# Patient Record
Sex: Male | Born: 1980
Health system: Southern US, Community
[De-identification: ages and names within clinical notes are randomized; demographics above are authoritative.]

## PROBLEM LIST (undated history)

## (undated) DIAGNOSIS — G43909 Migraine, unspecified, not intractable, without status migrainosus: Secondary | ICD-10-CM

## (undated) DIAGNOSIS — C801 Malignant (primary) neoplasm, unspecified: Secondary | ICD-10-CM

## (undated) DIAGNOSIS — Z87442 Personal history of urinary calculi: Secondary | ICD-10-CM

## (undated) DIAGNOSIS — I2699 Other pulmonary embolism without acute cor pulmonale: Secondary | ICD-10-CM

## (undated) DIAGNOSIS — N410 Acute prostatitis: Secondary | ICD-10-CM

## (undated) DIAGNOSIS — I10 Essential (primary) hypertension: Secondary | ICD-10-CM

## (undated) DIAGNOSIS — F419 Anxiety disorder, unspecified: Secondary | ICD-10-CM

## (undated) DIAGNOSIS — K219 Gastro-esophageal reflux disease without esophagitis: Secondary | ICD-10-CM

## (undated) DIAGNOSIS — N200 Calculus of kidney: Secondary | ICD-10-CM

## (undated) DIAGNOSIS — F411 Generalized anxiety disorder: Secondary | ICD-10-CM

## (undated) DIAGNOSIS — R030 Elevated blood-pressure reading, without diagnosis of hypertension: Secondary | ICD-10-CM

## (undated) DIAGNOSIS — J309 Allergic rhinitis, unspecified: Secondary | ICD-10-CM

## (undated) HISTORY — DX: Personal history of urinary calculi: Z87.442

## (undated) HISTORY — DX: Anxiety disorder, unspecified: F41.9

## (undated) HISTORY — DX: Gastro-esophageal reflux disease without esophagitis: K21.9

## (undated) HISTORY — DX: Migraine, unspecified, not intractable, without status migrainosus: G43.909

## (undated) HISTORY — DX: Elevated blood-pressure reading, without diagnosis of hypertension: R03.0

## (undated) HISTORY — DX: Allergic rhinitis, unspecified: J30.9

## (undated) HISTORY — DX: Acute prostatitis: N41.0

## (undated) HISTORY — DX: Generalized anxiety disorder: F41.1

## (undated) HISTORY — PX: HERNIA REPAIR: SHX51

---
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
## 2003-01-12 ENCOUNTER — Emergency Department (HOSPITAL_COMMUNITY): Admission: EM | Admit: 2003-01-12 | Discharge: 2003-01-13 | Payer: Self-pay | Admitting: Emergency Medicine

## 2004-07-27 ENCOUNTER — Ambulatory Visit: Payer: Self-pay | Admitting: Internal Medicine

## 2004-09-30 ENCOUNTER — Emergency Department (HOSPITAL_COMMUNITY): Admission: EM | Admit: 2004-09-30 | Discharge: 2004-10-01 | Payer: Self-pay | Admitting: *Deleted

## 2004-10-01 IMAGING — CT CT ABDOMEN W/O CM
1 of 2 series · 16 of 32 positions shown, 20 images · non-contrast
Comparison: none

CLINICAL DATA: Hematuria, left flank pain

ABDOMEN CT WITHOUT CONTRAST - URINARY STONE PROTOCOL
TECHNIQUE: Multidetector CT imaging of the abdomen was performed following the
urinary stone protocol.  No oral or intravenous contrast was administered.
TECHNIQUE: Multidetector CT imaging of the pelvis was performed following the

[Series 2: abd/pelv w/o 5.0 b31f st · axial · non-contrast · 0.82mm/px · z∈[-515,-55]mm · 16 of 100 slices shown, 20 images]
[im 4/100  soft-tissue]
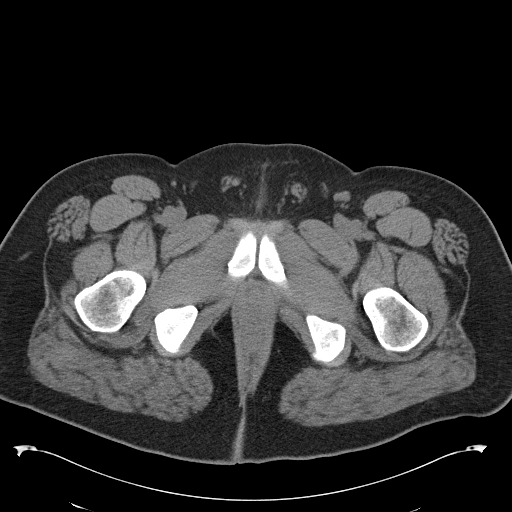
[im 4/100  bone]
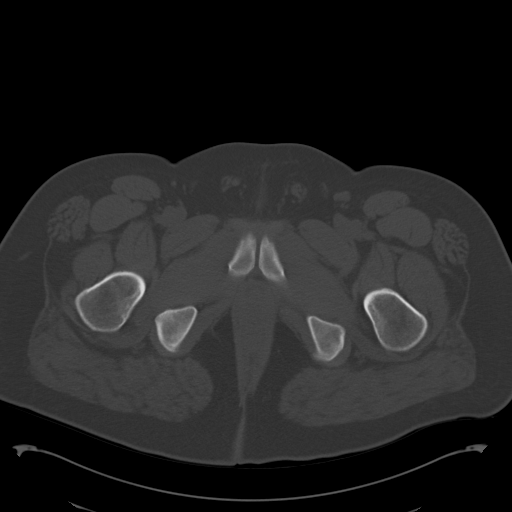
[im 12/100  soft-tissue]
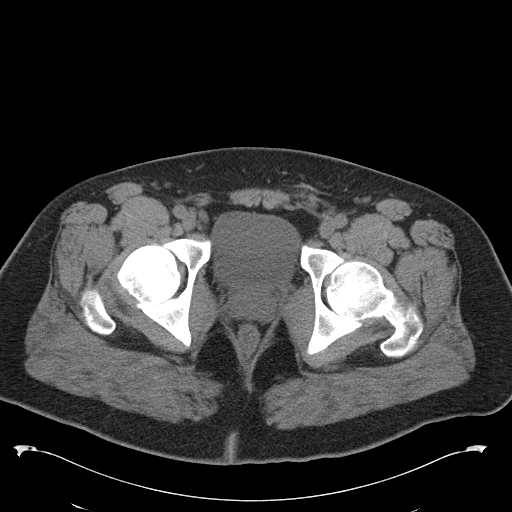
[im 20/100  soft-tissue]
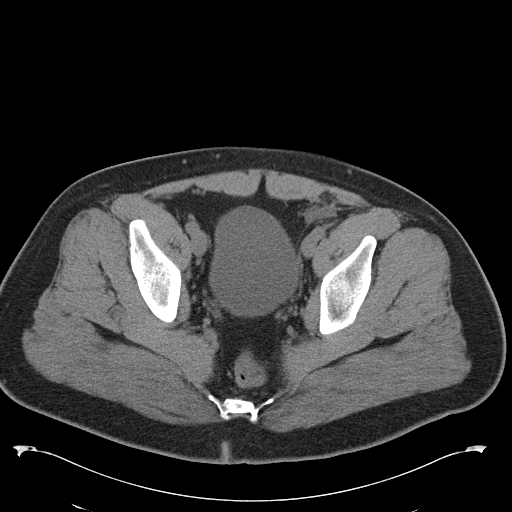
[im 28/100  soft-tissue]
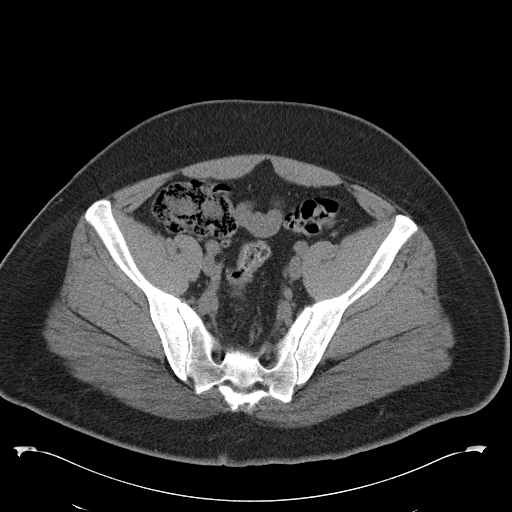
[im 32/100  soft-tissue]
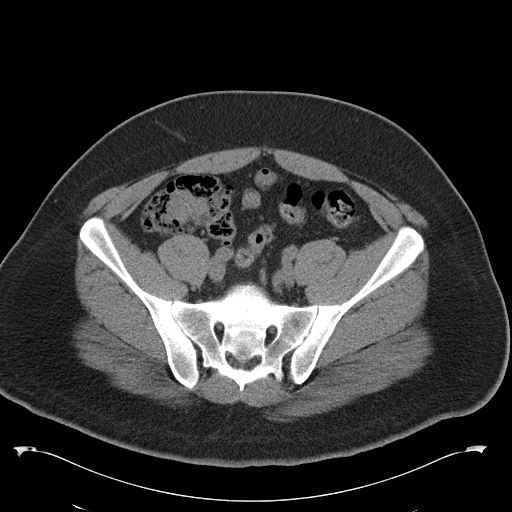
[im 40/100  soft-tissue]
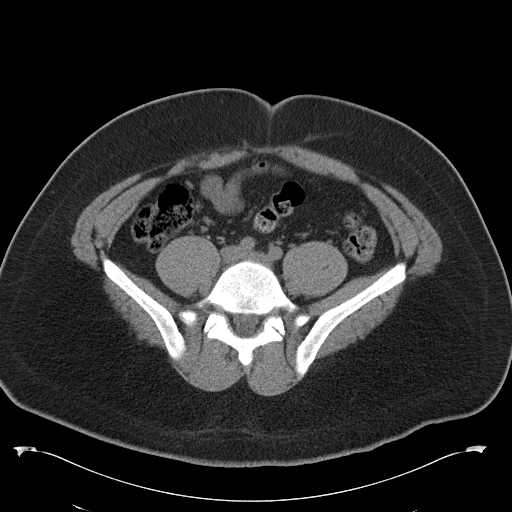
[im 48/100  soft-tissue]
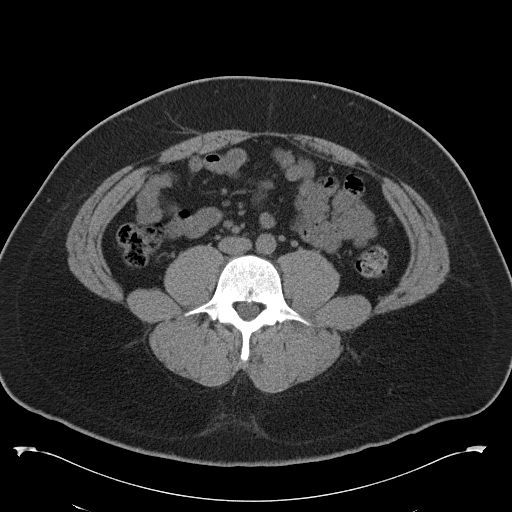
[im 52/100  soft-tissue]
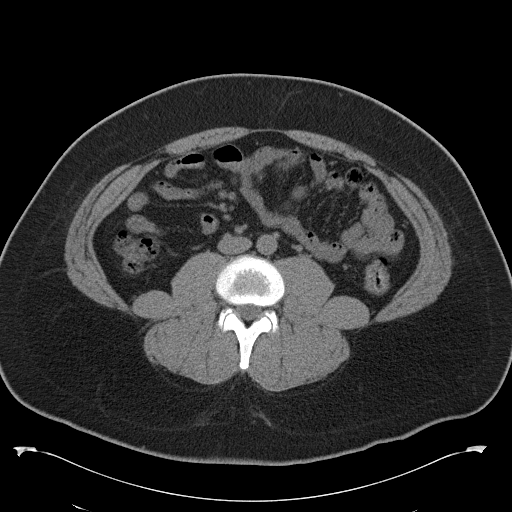
[im 60/100  soft-tissue]
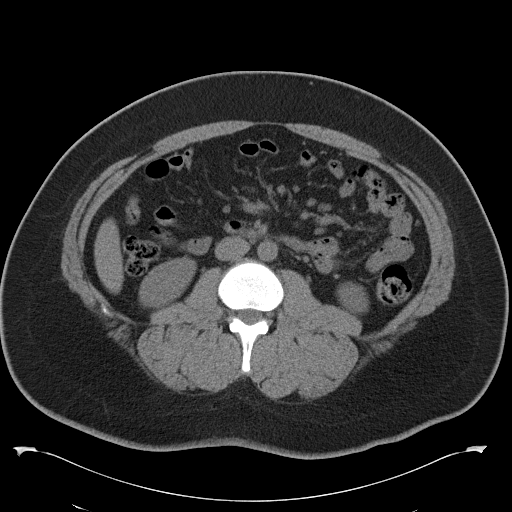
[im 60/100  bone]
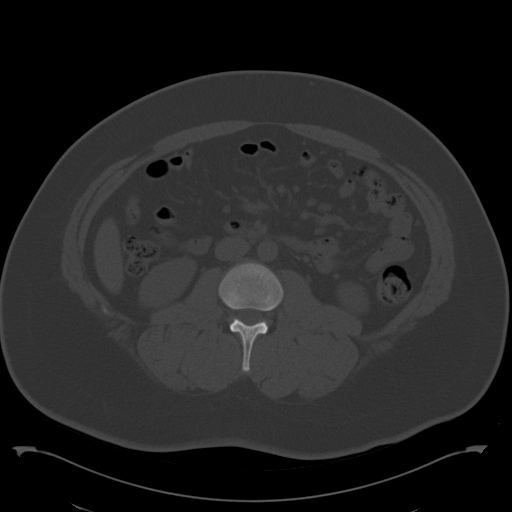
[im 68/100  soft-tissue]
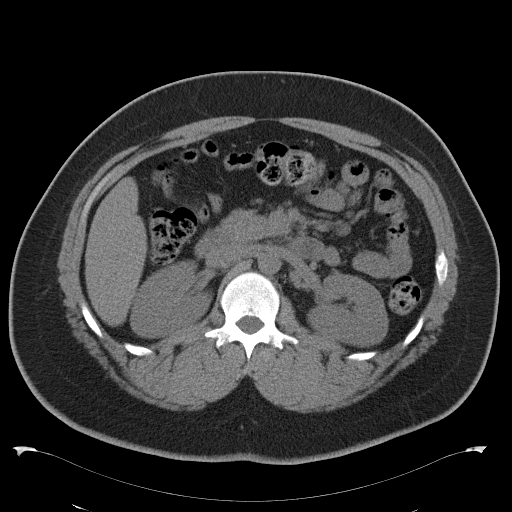
[im 76/100  soft-tissue]
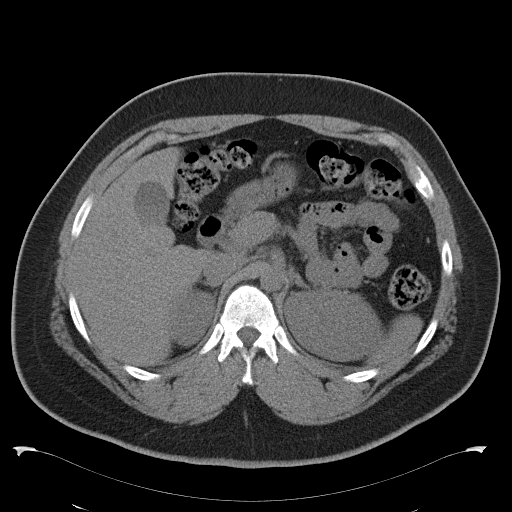
[im 80/100  soft-tissue]
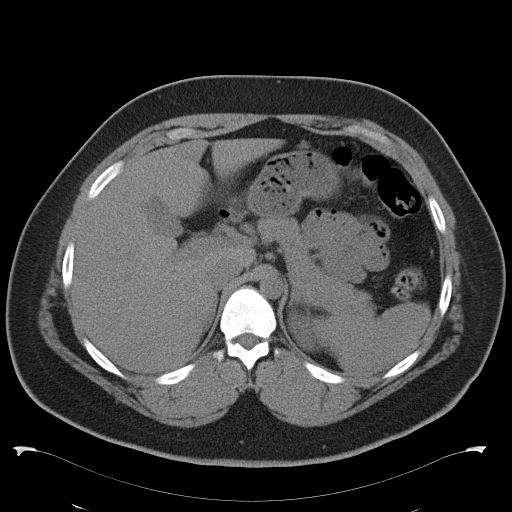
[im 84/100  lung]
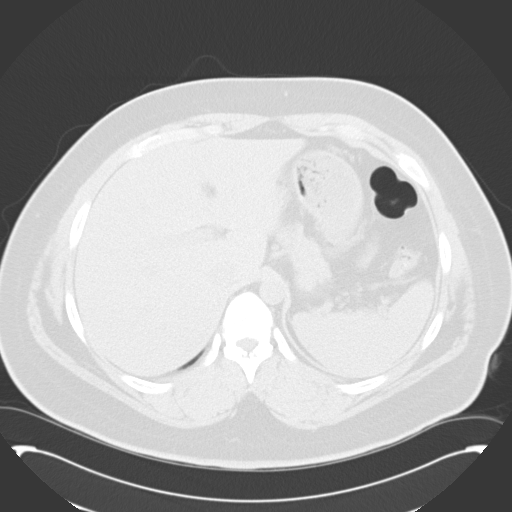
[im 88/100  soft-tissue]
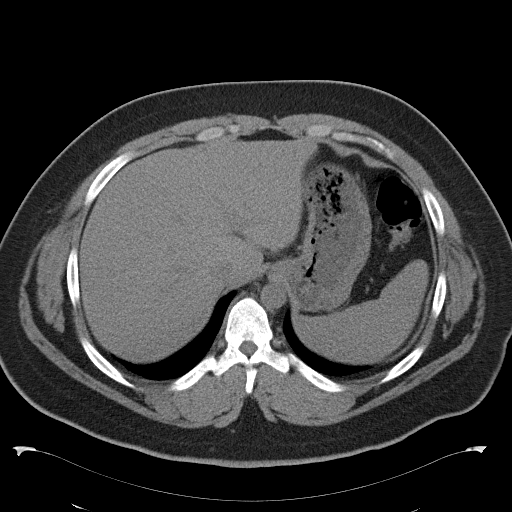
[im 88/100  lung]
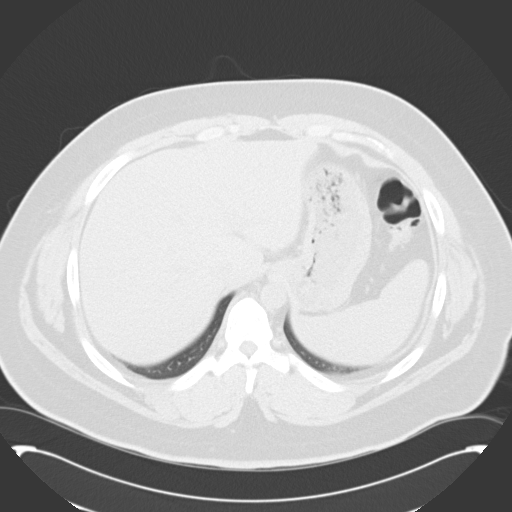
[im 92/100  lung]
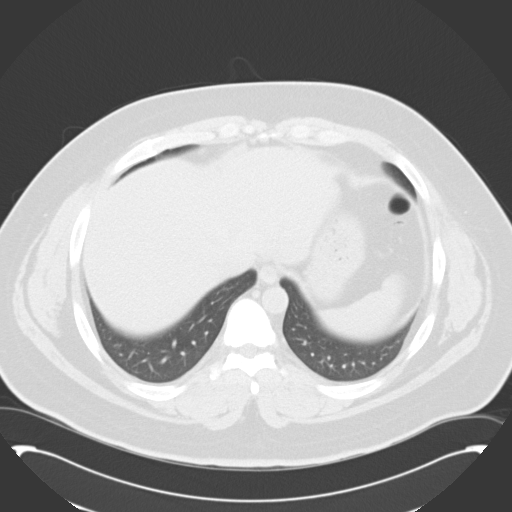
[im 96/100  soft-tissue]
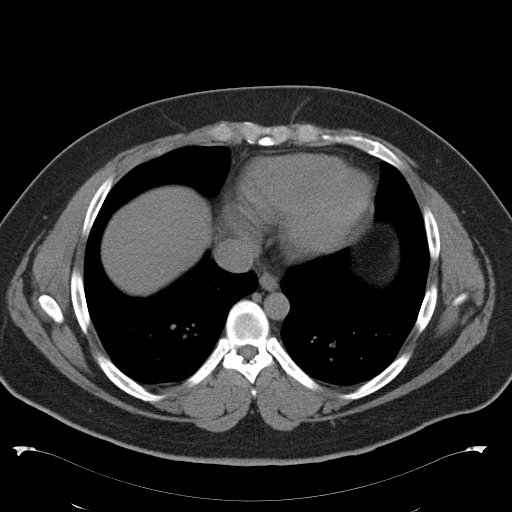
[im 96/100  lung]
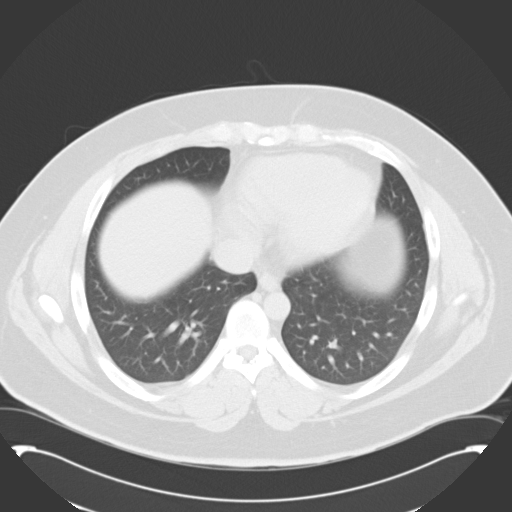

[16 of 32 positions shown; findings below may reference images not displayed]

FINDINGS: No evidence of hydronephrosis, renal, or ureteral calculi. Visualized
solid organs have an unremarkable uninfused appearance. Bowel and gallbladder
grossly unremarkable.

IMPRESSION

No acute findings. No hydronephrosis or stones.

PELVIS CT WITHOUT CONTRAST - URINARY STONE PROTOCOL
FINDINGS: Ureters rema[REDACTED]ompressed. No stones. Bowel grossly unremarkable.
Appendix is normal. No free fluid, free air, or adenopathy.

IMPRESSION

No stones. No acute findings.

## 2004-10-02 ENCOUNTER — Ambulatory Visit: Payer: Self-pay | Admitting: Internal Medicine

## 2005-10-12 ENCOUNTER — Ambulatory Visit: Payer: Self-pay | Admitting: Internal Medicine

## 2006-01-03 ENCOUNTER — Ambulatory Visit: Payer: Self-pay | Admitting: Internal Medicine

## 2006-07-01 ENCOUNTER — Ambulatory Visit: Payer: Self-pay | Admitting: Internal Medicine

## 2006-07-01 LAB — CONVERTED CEMR LAB
ALT: 22 units/L (ref 0–53)
AST: 17 units/L (ref 0–37)
Albumin: 3.8 g/dL (ref 3.5–5.2)
Alkaline Phosphatase: 89 units/L (ref 39–117)
BUN: 12 mg/dL (ref 6–23)
Basophils Absolute: 0 10*3/uL (ref 0.0–0.1)
Basophils Relative: 0.6 % (ref 0.0–1.0)
Bilirubin Urine: NEGATIVE
Bilirubin, Direct: 0.1 mg/dL (ref 0.0–0.3)
CO2: 32 meq/L (ref 19–32)
Calcium: 9.9 mg/dL (ref 8.4–10.5)
Chloride: 103 meq/L (ref 96–112)
Cholesterol: 197 mg/dL (ref 0–200)
Creatinine, Ser: 0.9 mg/dL (ref 0.4–1.5)
Crystals: NEGATIVE
Direct LDL: 86.3 mg/dL
Eosinophils Absolute: 0.1 10*3/uL (ref 0.0–0.6)
Eosinophils Relative: 3.2 % (ref 0.0–5.0)
GFR calc Af Amer: 130 mL/min
GFR calc non Af Amer: 108 mL/min
Glucose, Bld: 99 mg/dL (ref 70–99)
HCT: 38.6 % — ABNORMAL LOW (ref 39.0–52.0)
HDL: 31.6 mg/dL — ABNORMAL LOW (ref >39.0)
Hemoglobin, Urine: NEGATIVE
Hemoglobin: 13 g/dL (ref 13.0–17.0)
Ketones, ur: NEGATIVE mg/dL
Leukocytes, UA: NEGATIVE
Lymphocytes Relative: 37 % (ref 12.0–46.0)
MCHC: 33.6 g/dL (ref 30.0–36.0)
MCV: 86.5 fL (ref 78.0–100.0)
Monocytes Absolute: 0.4 10*3/uL (ref 0.2–0.7)
Monocytes Relative: 9.1 % (ref 3.0–11.0)
Neutro Abs: 2.4 10*3/uL (ref 1.4–7.7)
Neutrophils Relative %: 50.1 % (ref 43.0–77.0)
Nitrite: NEGATIVE
Platelets: 298 10*3/uL (ref 150–400)
Potassium: 4.2 meq/L (ref 3.5–5.1)
RBC / HPF: NONE SEEN
RBC: 4.46 M/uL (ref 4.22–5.81)
RDW: 13.4 % (ref 11.5–14.6)
Sodium: 141 meq/L (ref 135–145)
Specific Gravity, Urine: 1.025 (ref 1.000–1.03)
TSH: 1.5 microintl units/mL (ref 0.35–5.50)
Total Bilirubin: 0.9 mg/dL (ref 0.3–1.2)
Total CHOL/HDL Ratio: 6.2
Total Protein, Urine: NEGATIVE mg/dL
Total Protein: 7.8 g/dL (ref 6.0–8.3)
Triglycerides: 357 mg/dL (ref 0–149)
Urine Glucose: NEGATIVE mg/dL
Urobilinogen, UA: 0.2 (ref 0.0–1.0)
VLDL: 71 mg/dL — ABNORMAL HIGH (ref 0–40)
WBC: 4.6 10*3/uL (ref 4.5–10.5)
pH: 6 (ref 5.0–8.0)

## 2006-09-27 ENCOUNTER — Encounter: Payer: Self-pay | Admitting: *Deleted

## 2007-06-19 ENCOUNTER — Ambulatory Visit: Payer: Self-pay | Admitting: Internal Medicine

## 2007-06-19 DIAGNOSIS — F411 Generalized anxiety disorder: Secondary | ICD-10-CM

## 2007-06-19 DIAGNOSIS — R030 Elevated blood-pressure reading, without diagnosis of hypertension: Secondary | ICD-10-CM | POA: Insufficient documentation

## 2007-06-19 DIAGNOSIS — Z87442 Personal history of urinary calculi: Secondary | ICD-10-CM

## 2007-06-19 DIAGNOSIS — K219 Gastro-esophageal reflux disease without esophagitis: Secondary | ICD-10-CM | POA: Insufficient documentation

## 2007-06-19 DIAGNOSIS — J309 Allergic rhinitis, unspecified: Secondary | ICD-10-CM

## 2007-06-19 DIAGNOSIS — F419 Anxiety disorder, unspecified: Secondary | ICD-10-CM | POA: Insufficient documentation

## 2007-06-19 DIAGNOSIS — N410 Acute prostatitis: Secondary | ICD-10-CM

## 2007-06-19 HISTORY — DX: Personal history of urinary calculi: Z87.442

## 2007-06-19 HISTORY — DX: Elevated blood-pressure reading, without diagnosis of hypertension: R03.0

## 2007-06-19 HISTORY — DX: Generalized anxiety disorder: F41.1

## 2007-06-19 HISTORY — DX: Allergic rhinitis, unspecified: J30.9

## 2007-06-19 HISTORY — DX: Gastro-esophageal reflux disease without esophagitis: K21.9

## 2007-06-19 HISTORY — DX: Acute prostatitis: N41.0

## 2007-06-20 ENCOUNTER — Encounter: Payer: Self-pay | Admitting: Internal Medicine

## 2007-06-20 LAB — CONVERTED CEMR LAB
ALT: 22 units/L (ref 0–53)
AST: 19 units/L (ref 0–37)
Basophils Absolute: 0 10*3/uL (ref 0.0–0.1)
Bilirubin Urine: NEGATIVE
Bilirubin, Direct: 0.1 mg/dL (ref 0.0–0.3)
CO2: 33 meq/L — ABNORMAL HIGH (ref 19–32)
Chloride: 100 meq/L (ref 96–112)
Cholesterol: 184 mg/dL (ref 0–200)
Hemoglobin, Urine: NEGATIVE
Ketones, ur: NEGATIVE mg/dL
Leukocytes, UA: NEGATIVE
Lymphocytes Relative: 35.9 % (ref 12.0–46.0)
MCHC: 34 g/dL (ref 30.0–36.0)
Neutrophils Relative %: 51.4 % (ref 43.0–77.0)
Platelets: 283 10*3/uL (ref 150–400)
Potassium: 4.4 meq/L (ref 3.5–5.1)
RBC: 4.58 M/uL (ref 4.22–5.81)
RDW: 13.6 % (ref 11.5–14.6)
Sodium: 140 meq/L (ref 135–145)
Specific Gravity, Urine: 1.02 (ref 1.000–1.03)
Total Bilirubin: 1 mg/dL (ref 0.3–1.2)
Total CHOL/HDL Ratio: 6.1
Urobilinogen, UA: 0.2 (ref 0.0–1.0)
VLDL: 75 mg/dL — ABNORMAL HIGH (ref 0–40)

## 2007-07-14 ENCOUNTER — Ambulatory Visit: Payer: Self-pay | Admitting: Internal Medicine

## 2007-07-14 ENCOUNTER — Telehealth: Payer: Self-pay | Admitting: Internal Medicine

## 2007-07-14 DIAGNOSIS — J069 Acute upper respiratory infection, unspecified: Secondary | ICD-10-CM | POA: Insufficient documentation

## 2007-09-21 ENCOUNTER — Ambulatory Visit: Payer: Self-pay | Admitting: Internal Medicine

## 2007-09-21 DIAGNOSIS — L02838 Carbuncle of other sites: Secondary | ICD-10-CM

## 2007-09-21 DIAGNOSIS — L02828 Furuncle of other sites: Secondary | ICD-10-CM | POA: Insufficient documentation

## 2007-10-04 ENCOUNTER — Ambulatory Visit: Payer: Self-pay | Admitting: Internal Medicine

## 2007-12-17 ENCOUNTER — Emergency Department (HOSPITAL_COMMUNITY): Admission: EM | Admit: 2007-12-17 | Discharge: 2007-12-17 | Payer: Self-pay | Admitting: Emergency Medicine

## 2007-12-19 ENCOUNTER — Encounter: Payer: Self-pay | Admitting: Internal Medicine

## 2007-12-25 ENCOUNTER — Encounter: Admission: RE | Admit: 2007-12-25 | Discharge: 2007-12-25 | Payer: Self-pay | Admitting: Psychology

## 2007-12-25 IMAGING — US US ABDOMEN COMPLETE
1 series · 14 of 25 positions shown · non-contrast
Comparison: CT dated [DATE].

CLINICAL DATA: Right upper quadrant abdominal pain for the past 2-3
weeks.

ABDOMEN ULTRASOUND
TECHNIQUE: Complete abdominal ultrasound examination was performed
including evaluation of the liver, gallbladder, bile ducts,
pancreas, kidneys, spleen, IVC, and abdominal aorta.

[Series 1: us abdomen complete · 0.28mm/px · 14 of 80 slices shown]
[im 1/80]
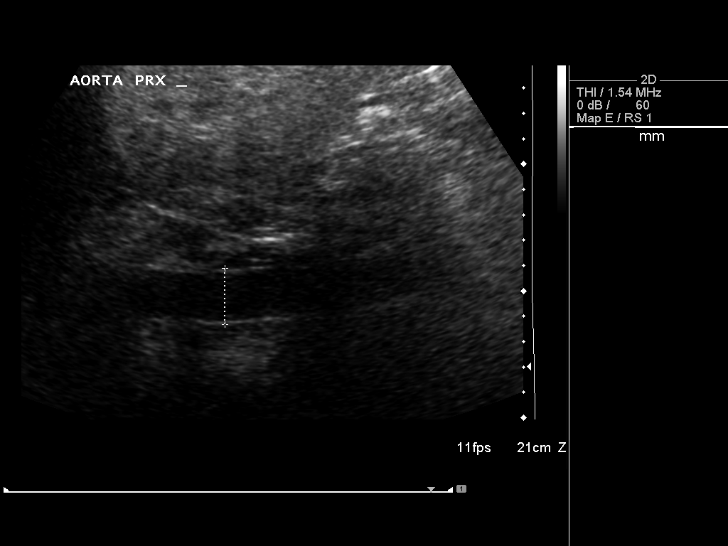
[im 7/80]
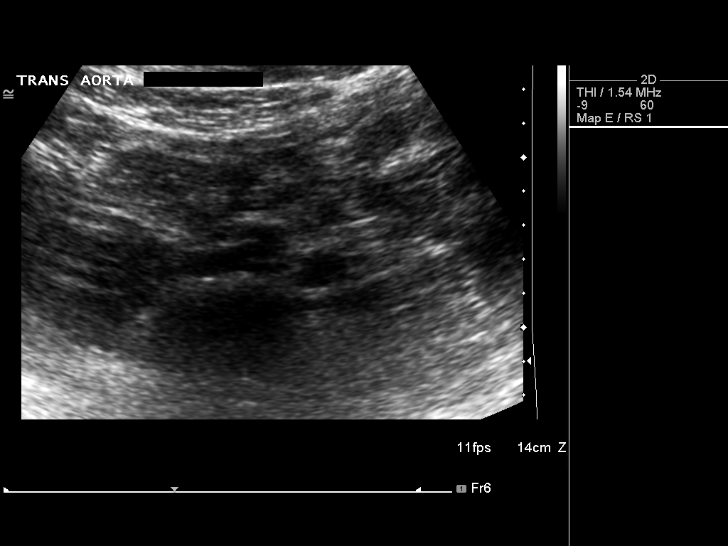
[im 14/80]
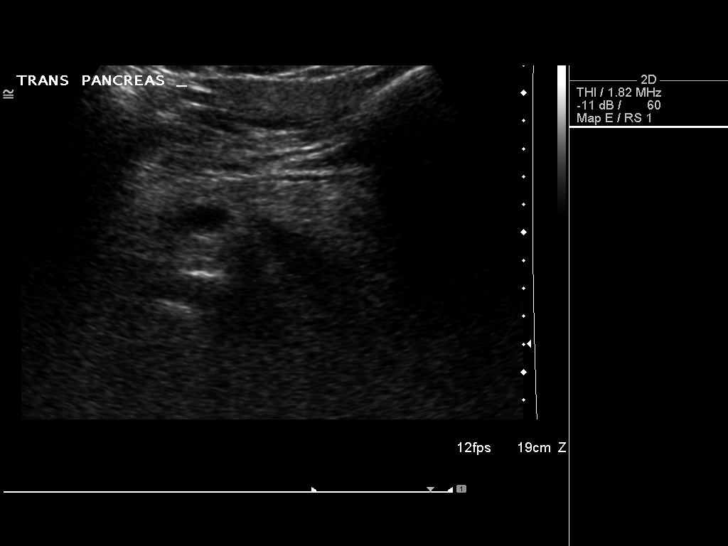
[im 20/80]
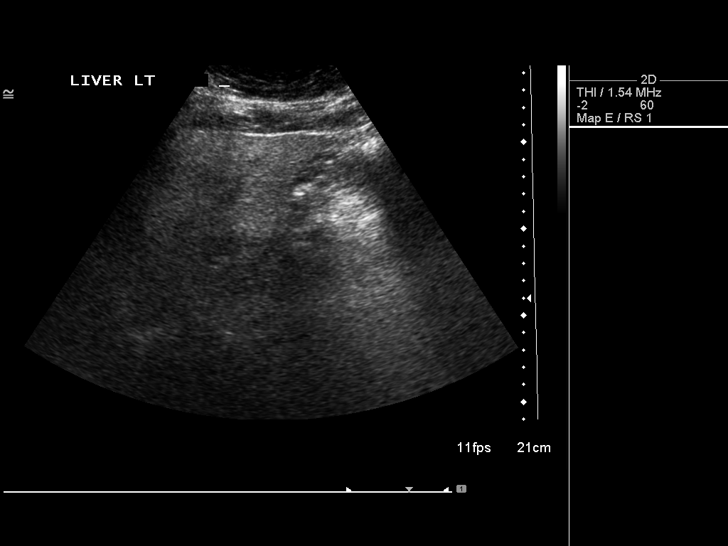
[im 27/80]
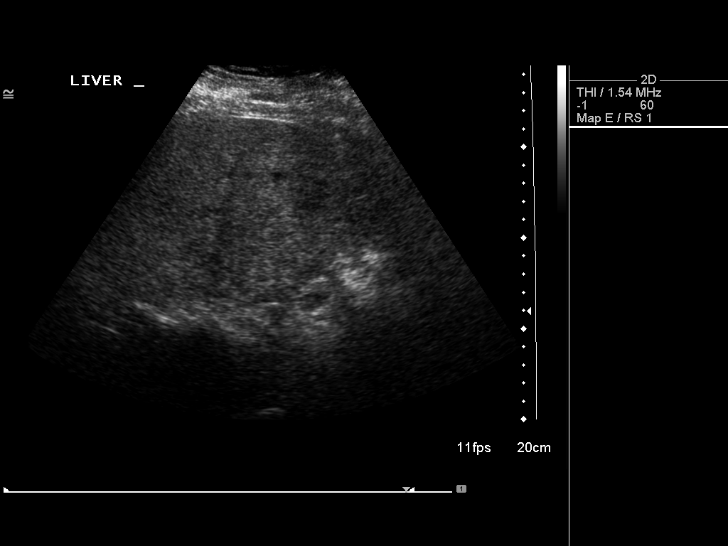
[im 30/80]
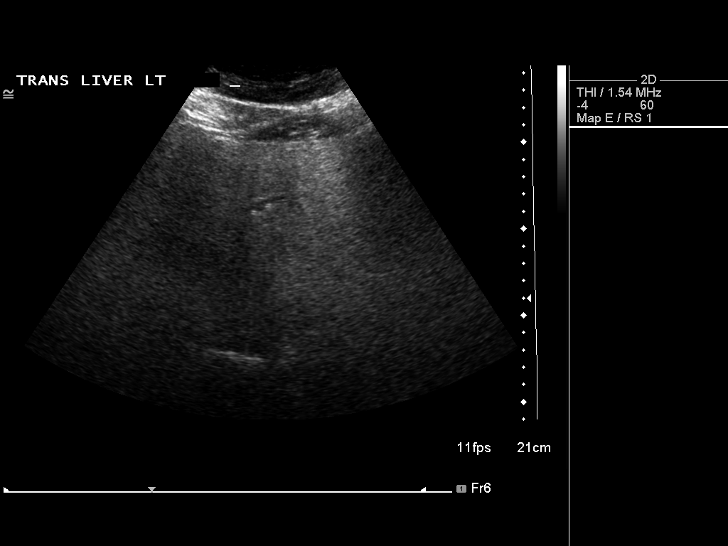
[im 37/80]
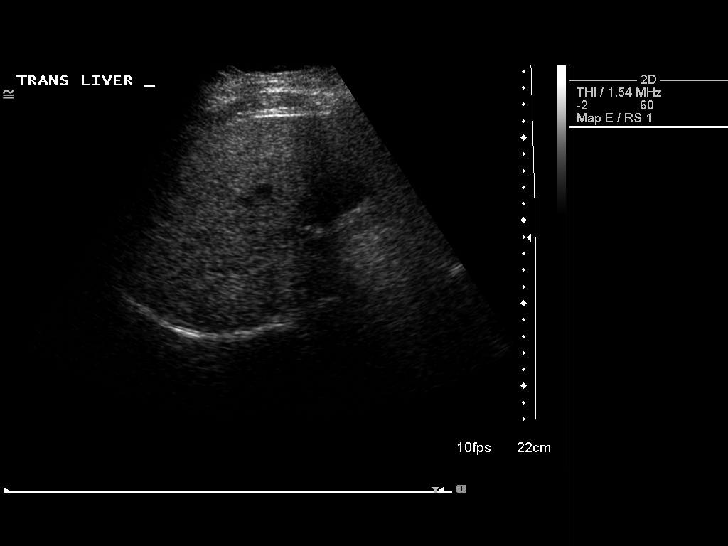
[im 43/80]
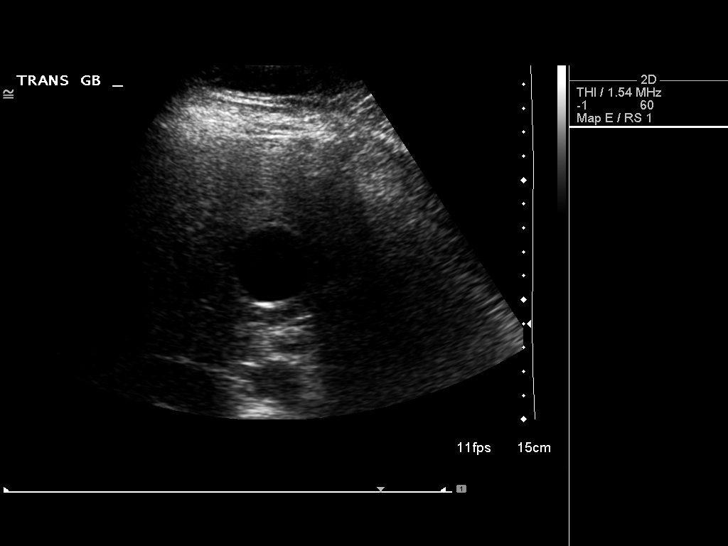
[im 50/80]
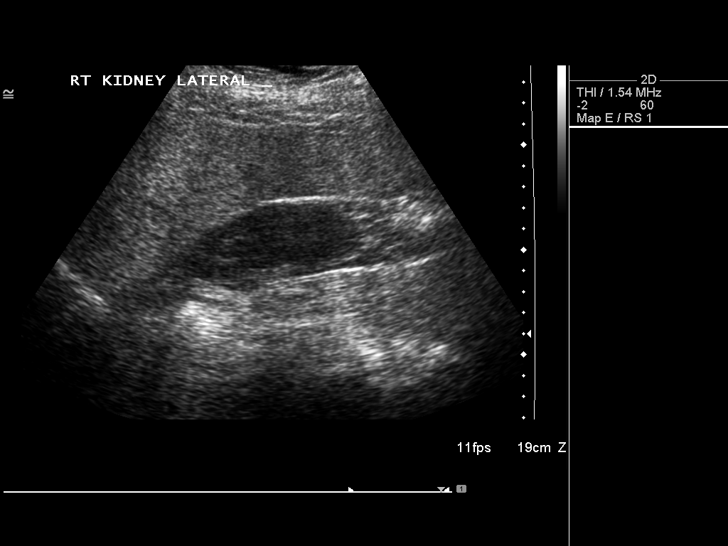
[im 53/80]
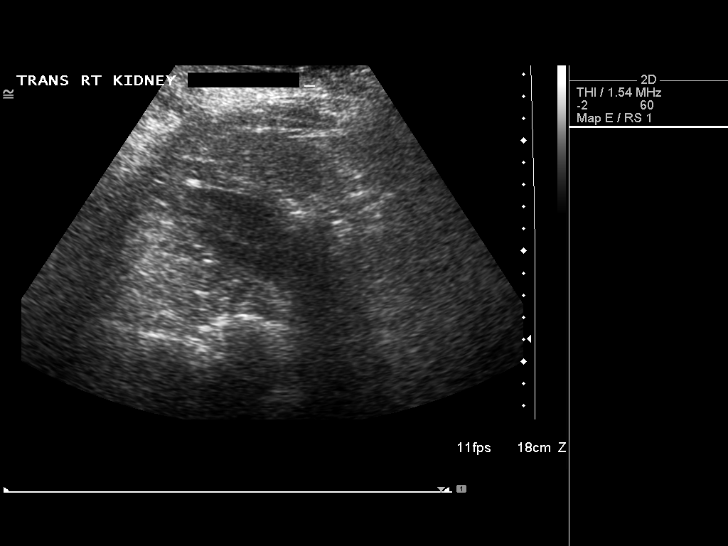
[im 60/80]
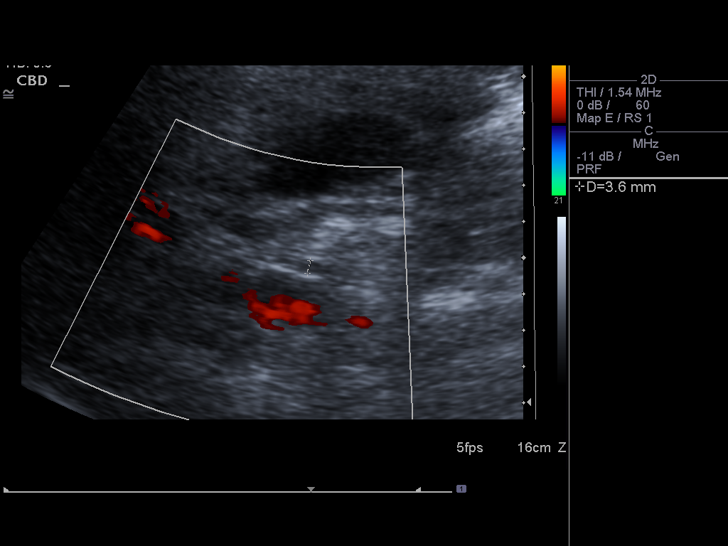
[im 66/80]
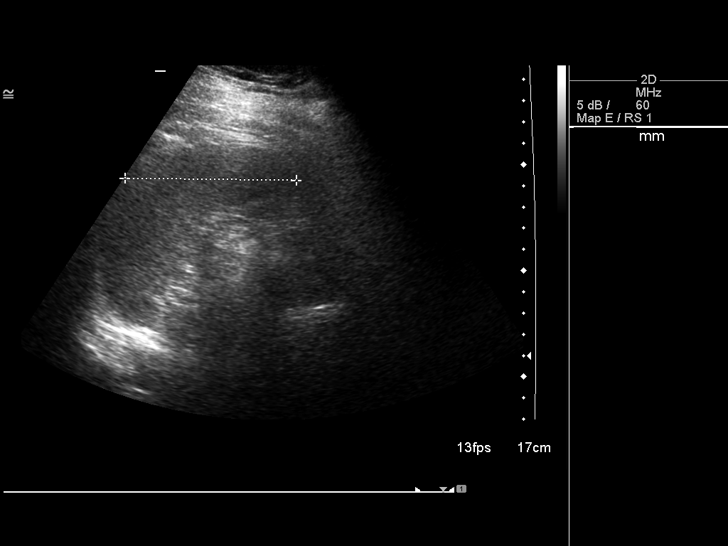
[im 73/80]
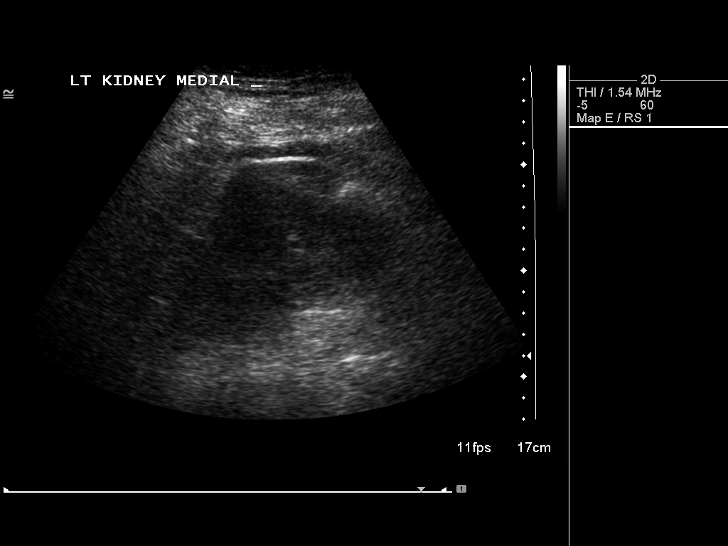
[im 80/80]
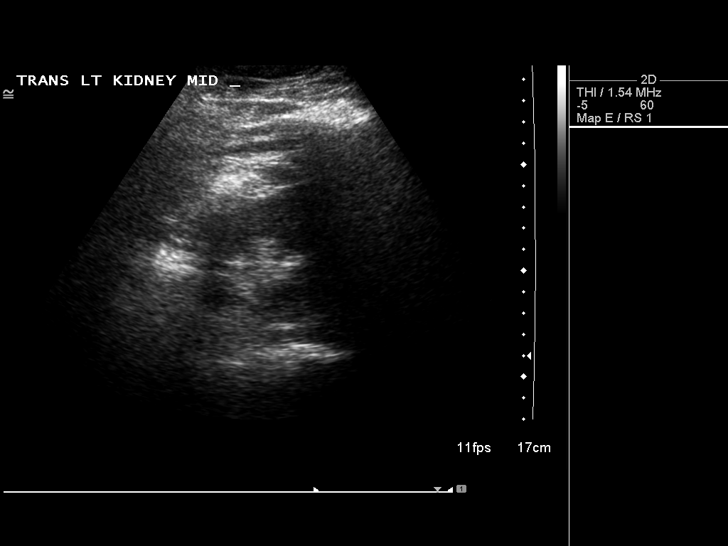

[14 of 25 positions shown; findings below may reference images not displayed]

FINDINGS: Diffusely echogenic and mildly heterogeneous liver.
Incompletely visualized pancreas with the visualized portions
appearing normal.  Normal appearing gallbladder, spleen, kidneys,
abdominal aorta and inferior vena cava.  No gallstones, biliary
ductal dilatation or free peritoneal fluid.  The proximal common
duct measures 3.6 mm in maximum diameter.
IMPRESSION: 1.  Diffusely echogenic and mildly heterogeneous liver, most likely
due to interval fatty infiltration.
2.  Incompletely visualized pancreas.

## 2008-01-16 ENCOUNTER — Encounter: Payer: Self-pay | Admitting: Internal Medicine

## 2008-05-15 ENCOUNTER — Ambulatory Visit: Payer: Self-pay | Admitting: Internal Medicine

## 2008-05-16 LAB — CONVERTED CEMR LAB
ALT: 38 units/L (ref 0–53)
Albumin: 3.6 g/dL (ref 3.5–5.2)
BUN: 8 mg/dL (ref 6–23)
Basophils Relative: 0.7 % (ref 0.0–3.0)
Bilirubin Urine: NEGATIVE
Bilirubin, Direct: 0.1 mg/dL (ref 0.0–0.3)
CO2: 32 meq/L (ref 19–32)
Chloride: 108 meq/L (ref 96–112)
Cholesterol: 197 mg/dL (ref 0–200)
Creatinine, Ser: 0.9 mg/dL (ref 0.4–1.5)
Eosinophils Absolute: 0.2 10*3/uL (ref 0.0–0.7)
HCT: 39.2 % (ref 39.0–52.0)
HDL: 29.4 mg/dL — ABNORMAL LOW (ref >39.00)
Ketones, ur: NEGATIVE mg/dL
Lymphs Abs: 1.1 10*3/uL (ref 0.7–4.0)
MCHC: 34.5 g/dL (ref 30.0–36.0)
MCV: 85.1 fL (ref 78.0–100.0)
Monocytes Absolute: 0.5 10*3/uL (ref 0.1–1.0)
Neutrophils Relative %: 65 % (ref 43.0–77.0)
Potassium: 4 meq/L (ref 3.5–5.1)
RBC: 4.61 M/uL (ref 4.22–5.81)
Specific Gravity, Urine: 1.02 (ref 1.000–1.030)
TSH: 1.5 microintl units/mL (ref 0.35–5.50)
Total Protein, Urine: NEGATIVE mg/dL
Total Protein: 8 g/dL (ref 6.0–8.3)
Triglycerides: 448 mg/dL — ABNORMAL HIGH (ref 0.0–149.0)
Urine Glucose: NEGATIVE mg/dL
pH: 7 (ref 5.0–8.0)

## 2010-09-07 ENCOUNTER — Emergency Department (HOSPITAL_COMMUNITY)
Admission: EM | Admit: 2010-09-07 | Discharge: 2010-09-07 | Disposition: A | Discharge status: Home / Self Care | Payer: 59 | Attending: Emergency Medicine | Admitting: Emergency Medicine

## 2010-09-07 ENCOUNTER — Encounter (HOSPITAL_COMMUNITY): Payer: Self-pay

## 2010-09-07 ENCOUNTER — Emergency Department (HOSPITAL_COMMUNITY): Payer: 59

## 2010-09-07 DIAGNOSIS — N201 Calculus of ureter: Secondary | ICD-10-CM | POA: Insufficient documentation

## 2010-09-07 DIAGNOSIS — R109 Unspecified abdominal pain: Secondary | ICD-10-CM | POA: Insufficient documentation

## 2010-09-07 HISTORY — DX: Calculus of kidney: N20.0

## 2010-09-07 LAB — URINALYSIS, ROUTINE W REFLEX MICROSCOPIC
Glucose, UA: NEGATIVE mg/dL
Leukocytes, UA: NEGATIVE
Protein, ur: NEGATIVE mg/dL
Specific Gravity, Urine: 1.021 (ref 1.005–1.030)
pH: 5.5 (ref 5.0–8.0)

## 2010-09-07 LAB — URINE MICROSCOPIC-ADD ON

## 2010-09-07 IMAGING — CT CT ABD-PELV W/O CM
2 of 4 series · 17 of 46 positions shown, 19 images · non-contrast
Comparison: [DATE]

CLINICAL DATA: Right flank pain since [DATE] a.m. today.
Microhematuria.

CT ABDOMEN AND PELVIS WITHOUT CONTRAST
TECHNIQUE: Multidetector CT imaging of the abdomen and pelvis was
performed following the standard protocol without intravenous
contrast.

[Series 2: renal stone · axial · 0.98mm/px · z∈[-532,-82]mm · 14 of 100 slices shown, 16 images]
[im 5/100  soft-tissue]
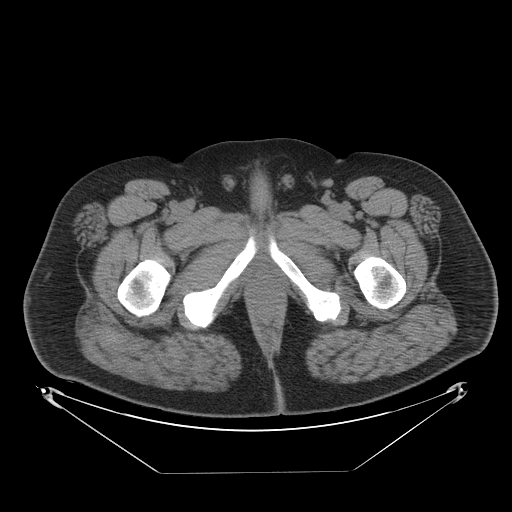
[im 5/100  bone]
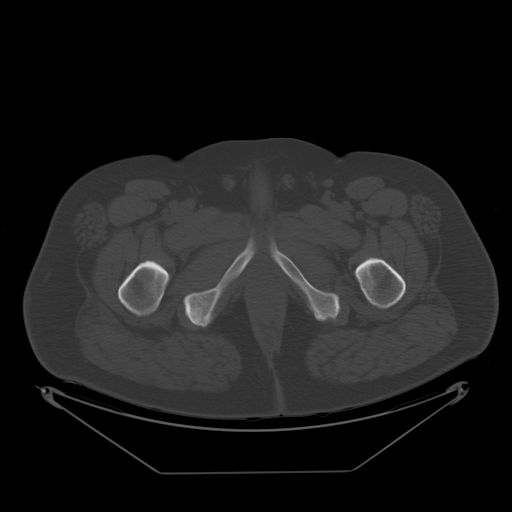
[im 13/100  soft-tissue]
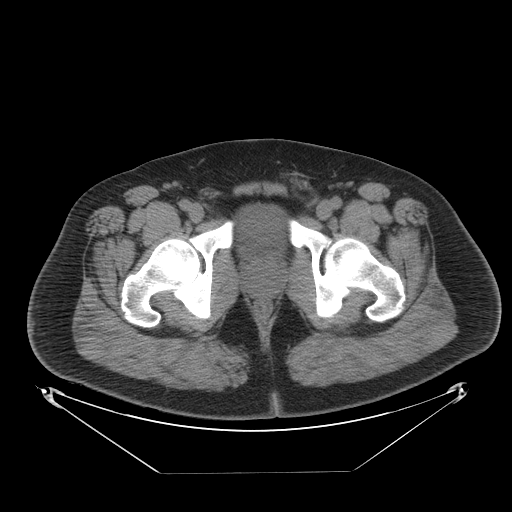
[im 21/100  soft-tissue]
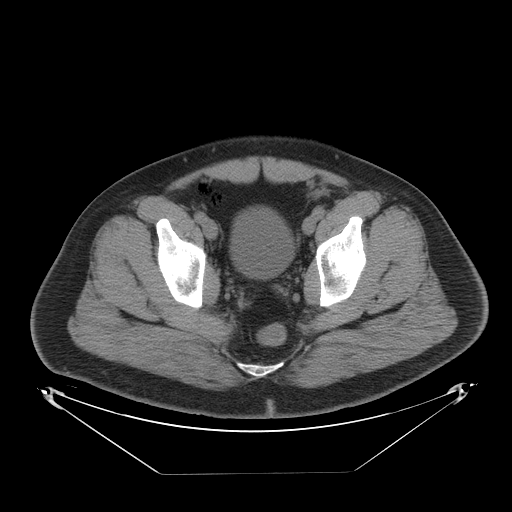
[im 25/100  soft-tissue]
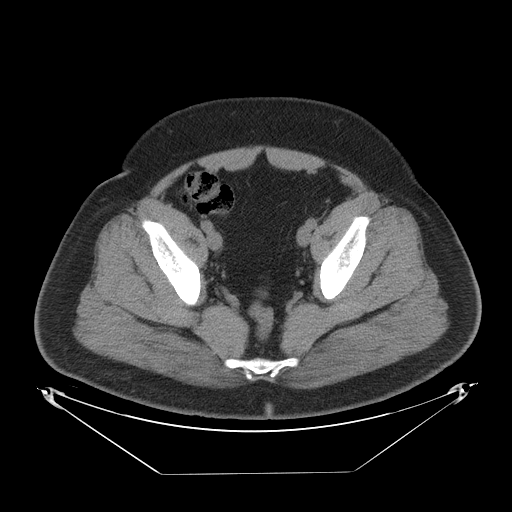
[im 34/100  soft-tissue]
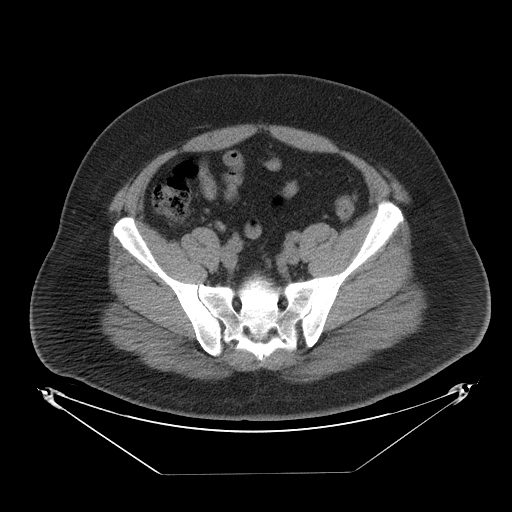
[im 42/100  soft-tissue]
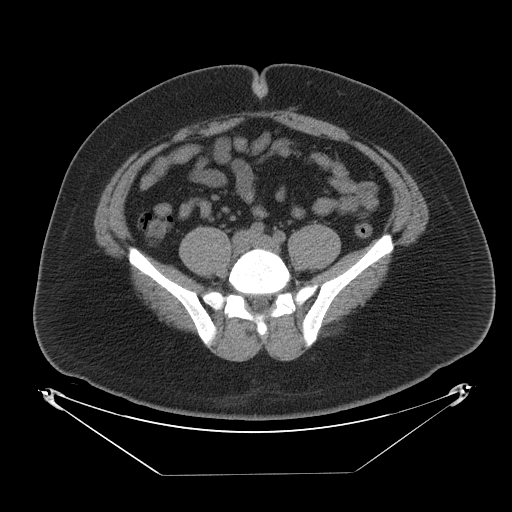
[im 46/100  soft-tissue]
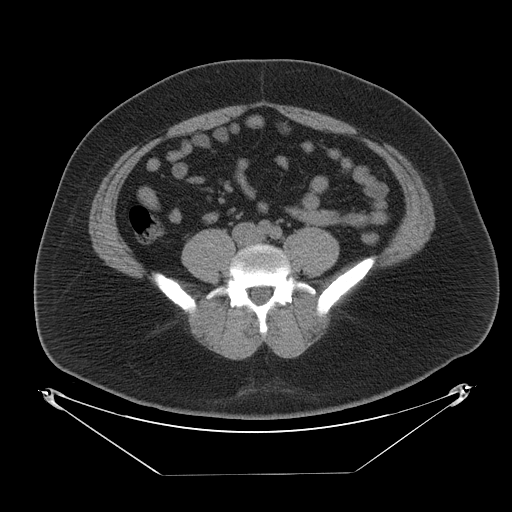
[im 54/100  soft-tissue]
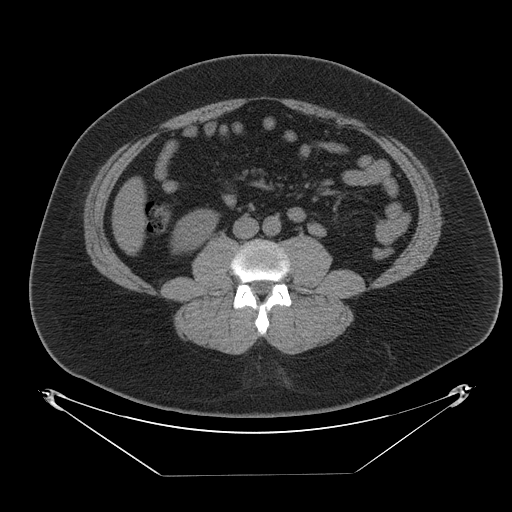
[im 58/100  soft-tissue]
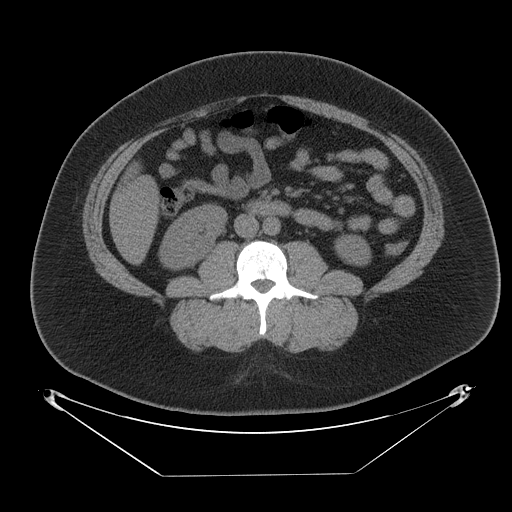
[im 58/100  bone]
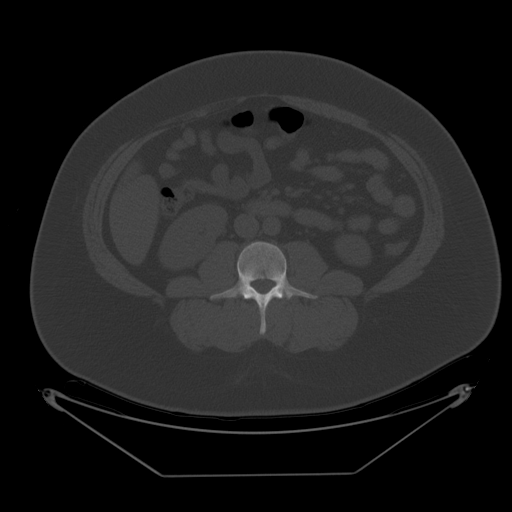
[im 67/100  soft-tissue]
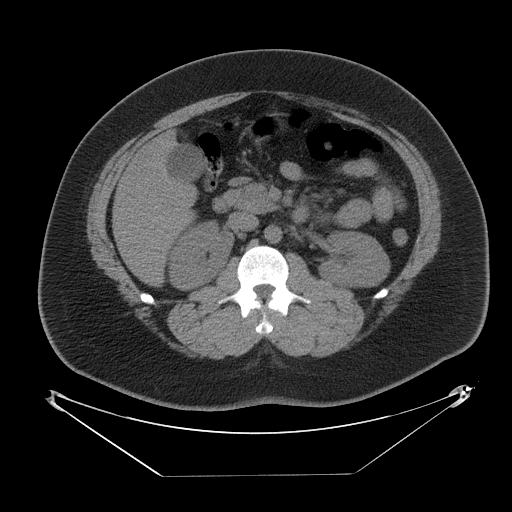
[im 75/100  soft-tissue]
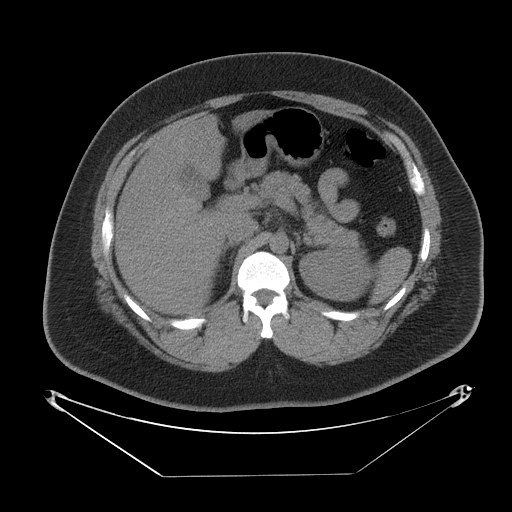
[im 79/100  soft-tissue]
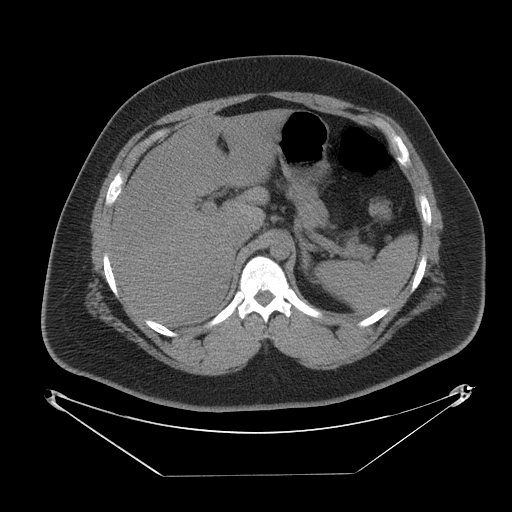
[im 87/100  soft-tissue]
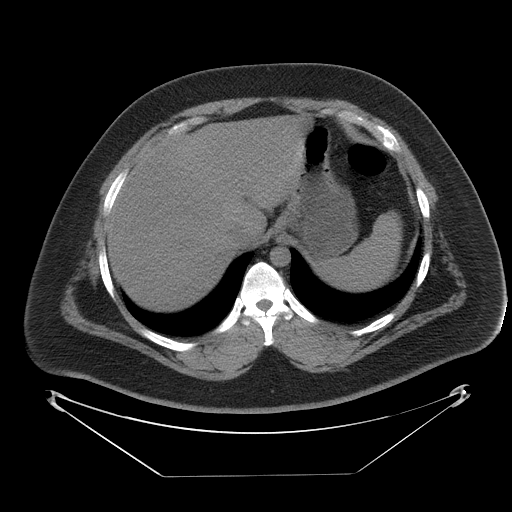
[im 95/100  soft-tissue]
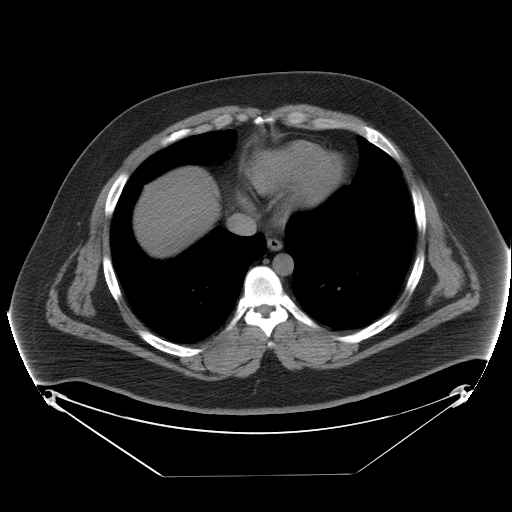

[Series 401: coronal · coronal · 1.01mm/px · 3 of 109 slices shown]
[im 37/109  soft-tissue]
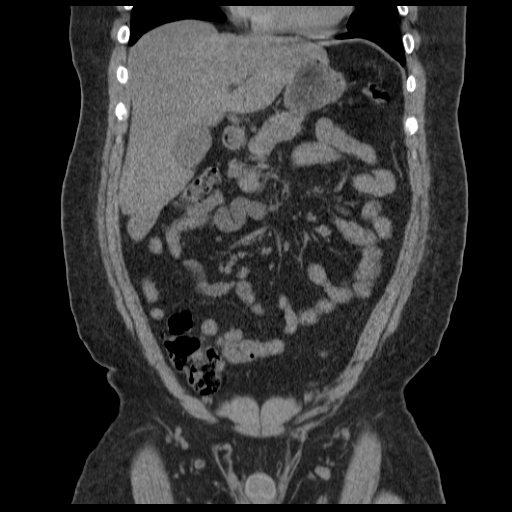
[im 49/109  soft-tissue]
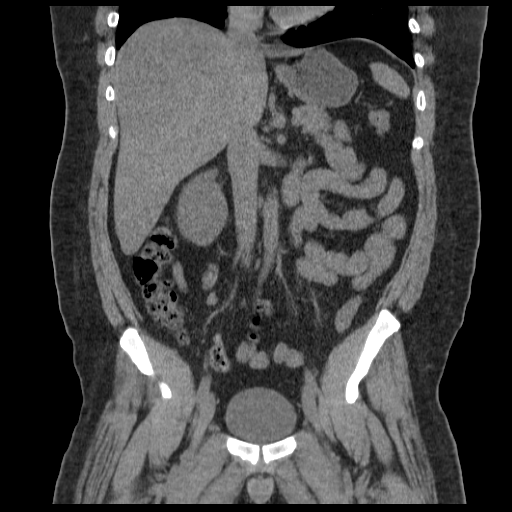
[im 61/109  soft-tissue]
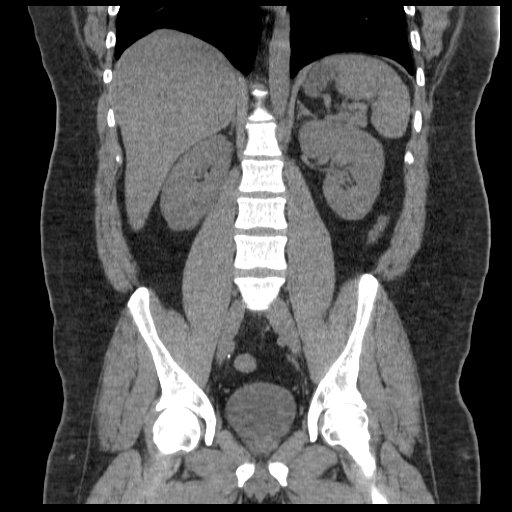

[17 of 46 positions shown; findings below may reference images not displayed]

FINDINGS: Lung bases are normal in appearance.  There is a 2 mm
calculus within the lower right ureter, at the level of the mid
sacrum.  This causes little hydronephrosis.  Minimal perinephric
stranding.  No additional intrarenal calculi are identified on the
right.  No renal mass identified.

No focal abnormality identified within the liver, spleen, left
kidney, pancreas, adrenal glands.  The gallbladder is present and
contains layering debris or possible stones.  No other CT evidence
for acute cholecystitis.  The stomach and small bowel loops have a
normal appearance.  Colonic loops are normal in appearance.  The
appendix is well seen and has a normal appearance. The urinary
bladder has a normal appearance.  No evidence for urethral calculi.

No retroperitoneal or mesenteric adenopathy. No evidence for aortic
aneurysm. Visualized osseous structures have a normal appearance.
IMPRESSION: 2 mm calculus in the lower right ureter, at the level of the mid
sacrum.

## 2010-09-25 ENCOUNTER — Telehealth: Payer: Self-pay

## 2010-09-25 DIAGNOSIS — Z0000 Encounter for general adult medical examination without abnormal findings: Secondary | ICD-10-CM

## 2010-09-25 NOTE — Telephone Encounter (Signed)
Put order in for labs. 

## 2010-10-15 ENCOUNTER — Other Ambulatory Visit (INDEPENDENT_AMBULATORY_CARE_PROVIDER_SITE_OTHER): Payer: 59

## 2010-10-15 ENCOUNTER — Encounter: Payer: Self-pay | Admitting: Internal Medicine

## 2010-10-15 ENCOUNTER — Other Ambulatory Visit: Payer: Self-pay | Admitting: Internal Medicine

## 2010-10-15 DIAGNOSIS — Z0000 Encounter for general adult medical examination without abnormal findings: Secondary | ICD-10-CM

## 2010-10-15 LAB — BASIC METABOLIC PANEL
BUN: 12 mg/dL (ref 6–23)
Creatinine, Ser: 1.1 mg/dL (ref 0.4–1.5)
GFR: 102.28 mL/min (ref >60.00)
Glucose, Bld: 97 mg/dL (ref 70–99)

## 2010-10-15 LAB — URINALYSIS, ROUTINE W REFLEX MICROSCOPIC
Bilirubin Urine: NEGATIVE
Ketones, ur: NEGATIVE
Leukocytes, UA: NEGATIVE
Nitrite: NEGATIVE
Specific Gravity, Urine: 1.02 (ref 1.000–1.030)
Urobilinogen, UA: 0.2 (ref 0.0–1.0)
pH: 6 (ref 5.0–8.0)

## 2010-10-15 LAB — HEPATIC FUNCTION PANEL
ALT: 24 U/L (ref 0–53)
AST: 23 U/L (ref 0–37)
Albumin: 4.2 g/dL (ref 3.5–5.2)
Total Bilirubin: 0.7 mg/dL (ref 0.3–1.2)

## 2010-10-15 LAB — CBC WITH DIFFERENTIAL/PLATELET
Basophils Relative: 0.9 % (ref 0.0–3.0)
Eosinophils Absolute: 0.2 10*3/uL (ref 0.0–0.7)
Eosinophils Relative: 2.8 % (ref 0.0–5.0)
HCT: 40.2 % (ref 39.0–52.0)
Hemoglobin: 13.4 g/dL (ref 13.0–17.0)
MCHC: 33.4 g/dL (ref 30.0–36.0)
MCV: 86.1 fl (ref 78.0–100.0)
Monocytes Absolute: 0.4 10*3/uL (ref 0.1–1.0)
Neutro Abs: 3.1 10*3/uL (ref 1.4–7.7)
RBC: 4.67 Mil/uL (ref 4.22–5.81)
WBC: 5.7 10*3/uL (ref 4.5–10.5)

## 2010-10-15 LAB — TSH: TSH: 1.44 u[IU]/mL (ref 0.35–5.50)

## 2010-10-15 LAB — LIPID PANEL
Cholesterol: 184 mg/dL (ref 0–200)
Total CHOL/HDL Ratio: 5
VLDL: 72.8 mg/dL — ABNORMAL HIGH (ref 0.0–40.0)

## 2010-10-18 ENCOUNTER — Encounter: Payer: Self-pay | Admitting: Internal Medicine

## 2010-10-18 DIAGNOSIS — Z7189 Other specified counseling: Secondary | ICD-10-CM | POA: Insufficient documentation

## 2010-10-18 DIAGNOSIS — Z0000 Encounter for general adult medical examination without abnormal findings: Secondary | ICD-10-CM | POA: Insufficient documentation

## 2010-10-18 DIAGNOSIS — Z0001 Encounter for general adult medical examination with abnormal findings: Secondary | ICD-10-CM | POA: Insufficient documentation

## 2010-10-21 ENCOUNTER — Encounter: Payer: Self-pay | Admitting: Internal Medicine

## 2010-10-21 ENCOUNTER — Ambulatory Visit (INDEPENDENT_AMBULATORY_CARE_PROVIDER_SITE_OTHER): Payer: 59 | Admitting: Internal Medicine

## 2010-10-21 VITALS — BP 130/80 | HR 92 | Temp 98.1°F | Ht 76.0 in | Wt 325.1 lb

## 2010-10-21 DIAGNOSIS — Z0000 Encounter for general adult medical examination without abnormal findings: Secondary | ICD-10-CM

## 2010-10-21 DIAGNOSIS — E669 Obesity, unspecified: Secondary | ICD-10-CM

## 2010-10-21 DIAGNOSIS — Z23 Encounter for immunization: Secondary | ICD-10-CM

## 2010-10-21 MED ORDER — PHENTERMINE HCL 37.5 MG PO CAPS: 37.5000 mg | ORAL_CAPSULE | ORAL | Status: AC

## 2010-10-21 NOTE — Progress Notes (Signed)
Subjective:    Patient ID: Marcus Burns, male    DOB: 06/13/1980, 30 y.o.   MRN: [id number]  HPI Here for wellness and f/u;  Overall doing ok;  Pt denies CP, worsening SOB, DOE, wheezing, orthopnea, PND, worsening LE edema, palpitations, dizziness or syncope.  Pt denies neurological change such as new Headache, facial or extremity weakness.  Pt denies polydipsia, polyuria, or low sugar symptoms. Pt states overall good compliance with treatment and medications, good tolerability, and trying to follow lower cholesterol diet.  Pt denies worsening depressive symptoms, suicidal ideation or panic. No fever, wt loss, night sweats, loss of appetite, or other constitutional symptoms.  Pt states good ability with ADL's, low fall risk, home safety reviewed and adequate, no significant changes in hearing or vision, and occasionally active with exercise.  No acute complaints. Hard to lose wt, now over 300 lbs Past Medical History  Diagnosis Date  . Inguinal hernia   . Kidney stone   . Anxiety   . GERD (gastroesophageal reflux disease)   . Migraines   . Acute prostatitis 06/19/2007  . ALLERGIC RHINITIS 06/19/2007  . ANXIETY 06/19/2007  . ELEVATED BLOOD PRESSURE WITHOUT DIAGNOSIS OF HYPERTENSION 06/19/2007  . GERD 06/19/2007  . NEPHROLITHIASIS, HX OF 06/19/2007   Past Surgical History  Procedure Date  . Hernia repair     reports that he has quit smoking. He does not have any smokeless tobacco history on file. He reports that he does not drink alcohol or use illicit drugs. family history includes Anxiety disorder in his brother and mother; Coronary artery disease in his father; and Diabetes in his brother. No Known Allergies Current Outpatient Prescriptions on File Prior to Visit  Medication Sig Dispense Refill  . ALPRAZolam (XANAX) 0.5 MG tablet Take 0.5 mg by mouth 2 (two) times daily as needed.         Review of Systems Review of Systems  Constitutional: Negative for diaphoresis, activity change,  appetite change and unexpected weight change.  HENT: Negative for hearing loss, ear pain, facial swelling, mouth sores and neck stiffness.   Eyes: Negative for pain, redness and visual disturbance.  Respiratory: Negative for shortness of breath and wheezing.   Cardiovascular: Negative for chest pain and palpitations.  Gastrointestinal: Negative for diarrhea, blood in stool, abdominal distention and rectal pain.  Genitourinary: Negative for hematuria, flank pain and decreased urine volume.  Musculoskeletal: Negative for myalgias and joint swelling.  Skin: Negative for color change and wound.  Neurological: Negative for syncope and numbness.  Hematological: Negative for adenopathy.  Psychiatric/Behavioral: Negative for hallucinations, self-injury, decreased concentration and agitation.       Objective:   Physical Exam BP 130/80  Pulse 92  Temp(Src) 98.1 F (36.7 C) (Oral)  Ht 6\' 4"  (1.93 m)  Wt 325 lb 1.9 oz (147.473 kg)  BMI 39.57 kg/m2  SpO2 98% Physical Exam  VS noted Constitutional: Pt is oriented to person, place, and time. Appears well-developed and well-nourished.  HENT:  Head: Normocephalic and atraumatic.  Right Ear: External ear normal.  Left Ear: External ear normal.  Nose: Nose normal.  Mouth/Throat: Oropharynx is clear and moist.  Eyes: Conjunctivae and EOM are normal. Pupils are equal, round, and reactive to light.  Neck: Normal range of motion. Neck supple. No JVD present. No tracheal deviation present.  Cardiovascular: Normal rate, regular rhythm, normal heart sounds and intact distal pulses.   Pulmonary/Chest: Effort normal and breath sounds normal.  Abdominal: Soft. Bowel sounds are normal. There  is no tenderness.  Musculoskeletal: Normal range of motion. Exhibits no edema.  Lymphadenopathy:  Has no cervical adenopathy.  Neurological: Pt is alert and oriented to person, place, and time. Pt has normal reflexes. No cranial nerve deficit.  Skin: Skin is warm and  dry. No rash noted.  Psychiatric:  Has  normal mood and affect. Behavior is normal.     Assessment & Plan:

## 2010-10-21 NOTE — Assessment & Plan Note (Signed)
Ok for phentermine limited rx,  to f/u any worsening symptoms or concerns

## 2010-10-21 NOTE — Assessment & Plan Note (Signed)

## 2010-10-21 NOTE — Patient Instructions (Addendum)
Take all new medications as prescribed - the phentermine for weight loss Continue all other medications as before Please get regular exercise, and follow lower calorie, low cholesterol/low fat diet Please return in 1 year for your yearly visit, or sooner if needed, with Lab testing done 3-5 days before

## 2010-11-12 ENCOUNTER — Encounter: Payer: 59 | Admitting: Internal Medicine

## 2011-06-14 ENCOUNTER — Emergency Department (HOSPITAL_COMMUNITY)
Admission: EM | Admit: 2011-06-14 | Discharge: 2011-06-14 | Discharge status: Absent without leave | Payer: Self-pay | Source: Home / Self Care

## 2011-10-22 ENCOUNTER — Encounter: Payer: 59 | Admitting: Internal Medicine

## 2011-10-22 DIAGNOSIS — Z0289 Encounter for other administrative examinations: Secondary | ICD-10-CM

## 2012-01-14 ENCOUNTER — Ambulatory Visit
Admission: RE | Admit: 2012-01-14 | Discharge: 2012-01-14 | Disposition: A | Discharge status: Home / Self Care | Payer: 59 | Source: Ambulatory Visit | Attending: Internal Medicine | Admitting: Internal Medicine

## 2012-01-14 ENCOUNTER — Other Ambulatory Visit (INDEPENDENT_AMBULATORY_CARE_PROVIDER_SITE_OTHER): Payer: 59

## 2012-01-14 ENCOUNTER — Encounter: Payer: Self-pay | Admitting: Internal Medicine

## 2012-01-14 ENCOUNTER — Ambulatory Visit (INDEPENDENT_AMBULATORY_CARE_PROVIDER_SITE_OTHER): Payer: 59 | Admitting: Internal Medicine

## 2012-01-14 VITALS — BP 142/92 | HR 94 | Temp 98.0°F | Ht 76.0 in | Wt 333.5 lb

## 2012-01-14 DIAGNOSIS — Z0000 Encounter for general adult medical examination without abnormal findings: Secondary | ICD-10-CM

## 2012-01-14 DIAGNOSIS — N5089 Other specified disorders of the male genital organs: Secondary | ICD-10-CM

## 2012-01-14 DIAGNOSIS — N508 Other specified disorders of male genital organs: Secondary | ICD-10-CM

## 2012-01-14 DIAGNOSIS — Z23 Encounter for immunization: Secondary | ICD-10-CM

## 2012-01-14 LAB — URINALYSIS, ROUTINE W REFLEX MICROSCOPIC
Bilirubin Urine: NEGATIVE
Hgb urine dipstick: NEGATIVE
Total Protein, Urine: NEGATIVE
Urine Glucose: NEGATIVE
pH: 6 (ref 5.0–8.0)

## 2012-01-14 LAB — HEPATIC FUNCTION PANEL
ALT: 32 U/L (ref 0–53)
Alkaline Phosphatase: 83 U/L (ref 39–117)
Bilirubin, Direct: 0.1 mg/dL (ref 0.0–0.3)
Total Bilirubin: 0.7 mg/dL (ref 0.3–1.2)
Total Protein: 8.3 g/dL (ref 6.0–8.3)

## 2012-01-14 LAB — LIPID PANEL
Cholesterol: 176 mg/dL (ref 0–200)
HDL: 32.7 mg/dL — ABNORMAL LOW (ref >39.00)
Total CHOL/HDL Ratio: 5
Triglycerides: 343 mg/dL — ABNORMAL HIGH (ref 0.0–149.0)
VLDL: 68.6 mg/dL — ABNORMAL HIGH (ref 0.0–40.0)

## 2012-01-14 LAB — CBC WITH DIFFERENTIAL/PLATELET
Basophils Relative: 0.4 % (ref 0.0–3.0)
Eosinophils Relative: 1.6 % (ref 0.0–5.0)
Lymphocytes Relative: 25.3 % (ref 12.0–46.0)
Neutrophils Relative %: 64.1 % (ref 43.0–77.0)
Platelets: 306 10*3/uL (ref 150.0–400.0)
RBC: 4.76 Mil/uL (ref 4.22–5.81)
WBC: 5.5 10*3/uL (ref 4.5–10.5)

## 2012-01-14 LAB — BASIC METABOLIC PANEL
Calcium: 10 mg/dL (ref 8.4–10.5)
Creatinine, Ser: 1 mg/dL (ref 0.4–1.5)
GFR: 110.9 mL/min (ref >60.00)

## 2012-01-14 IMAGING — US US SCROTUM
1 series · 14 of 25 positions shown · non-contrast
Comparison: CT abdomen pelvis of [DATE]

CLINICAL DATA: Question of 1 cm mass palpated medial to the right
testicle

ULTRASOUND OF SCROTUM
TECHNIQUE: Complete ultrasound examination of the testicles,
epididymis, and other scrotal structures was performed.

[Series 1: us scrotum · 0.08mm/px · 14 of 48 slices shown]
[im 1/48]
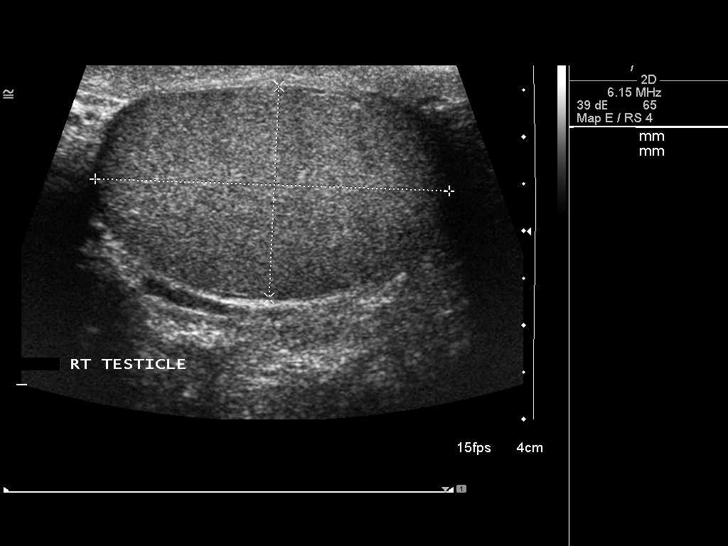
[im 4/48]
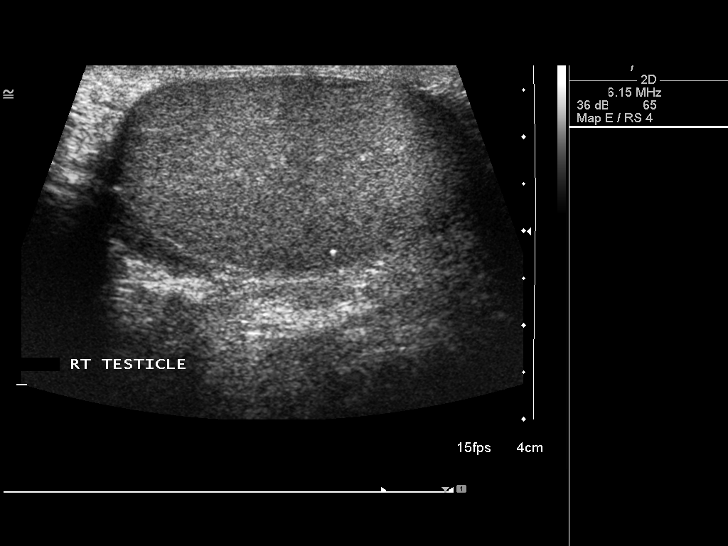
[im 8/48]
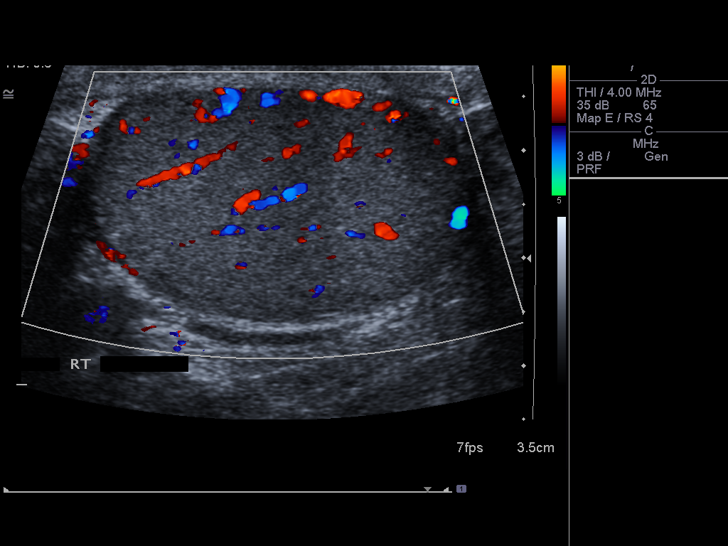
[im 12/48]
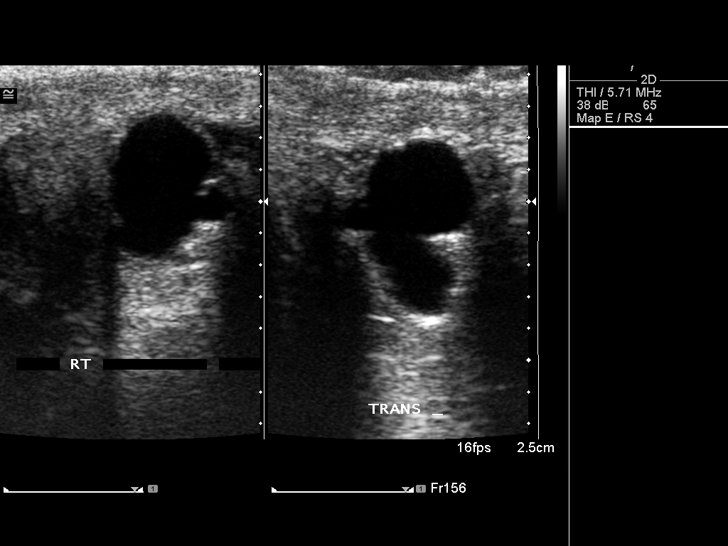
[im 16/48]
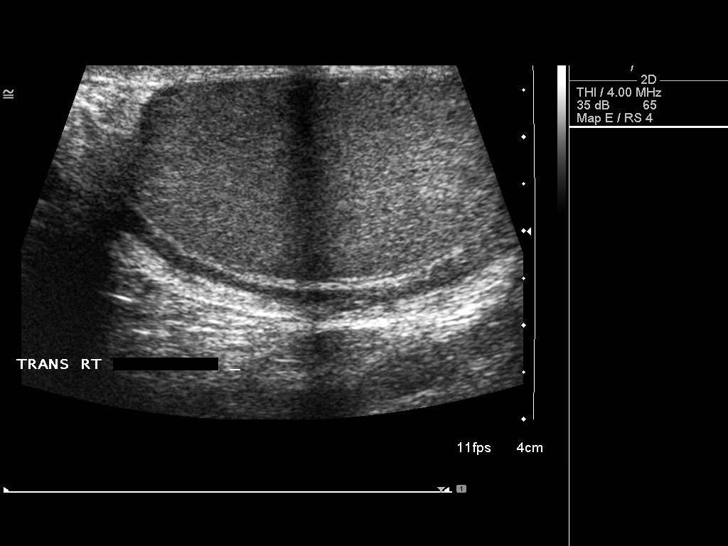
[im 18/48]
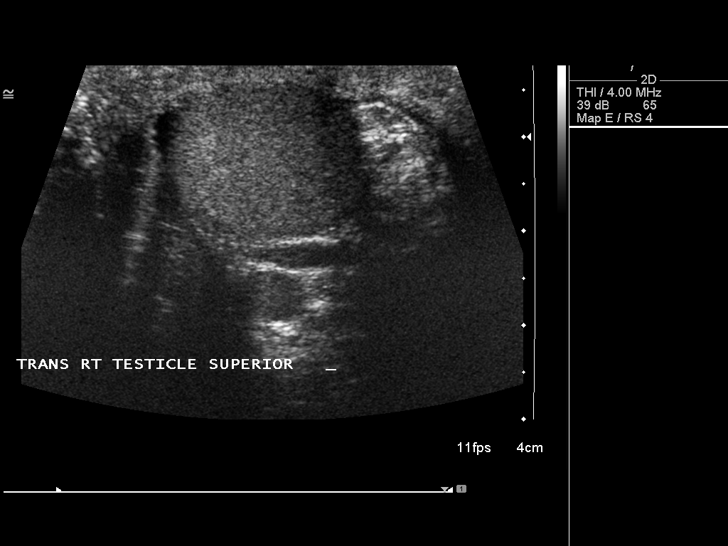
[im 22/48]
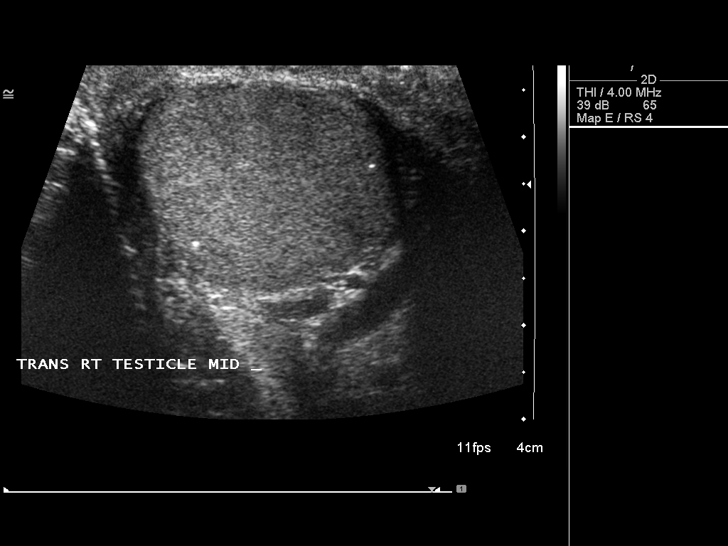
[im 26/48]
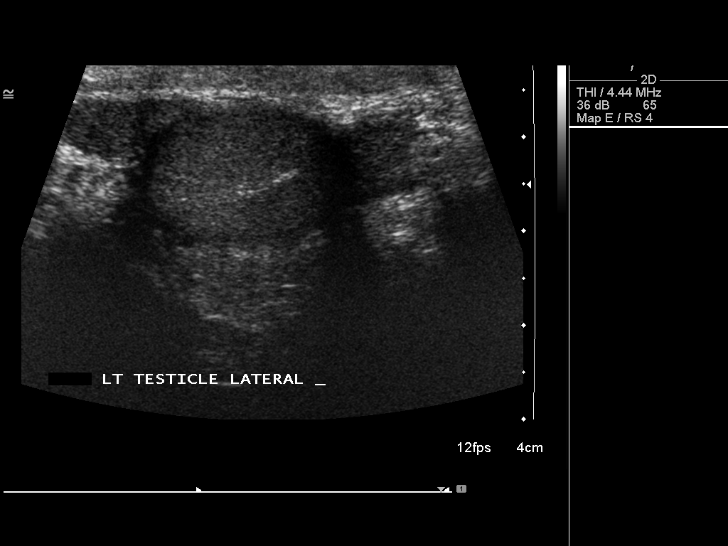
[im 30/48]
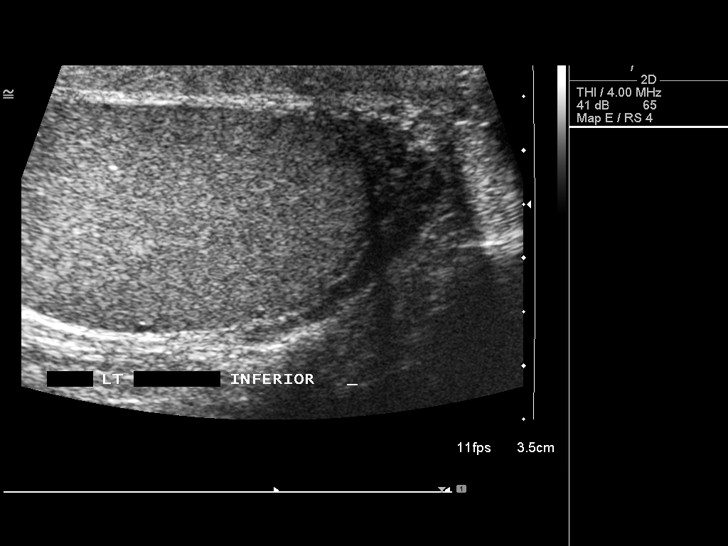
[im 32/48]
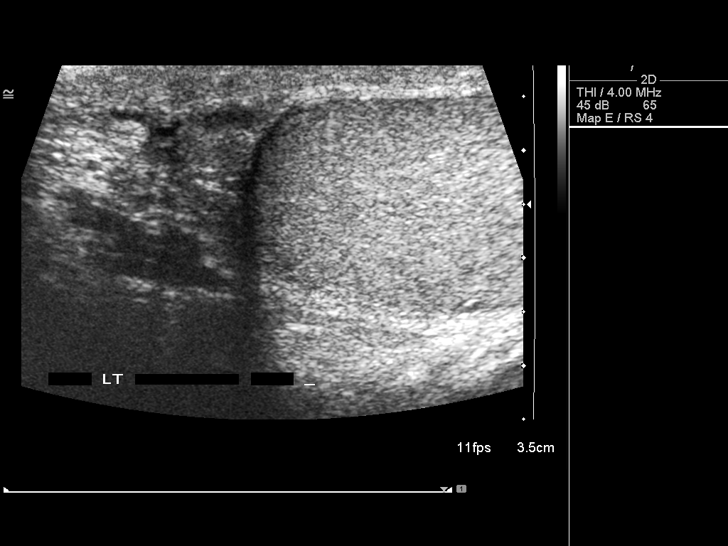
[im 36/48]
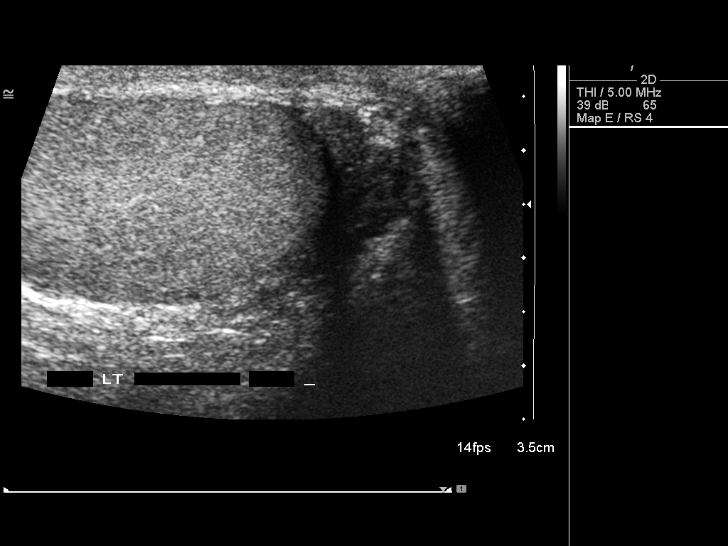
[im 40/48]
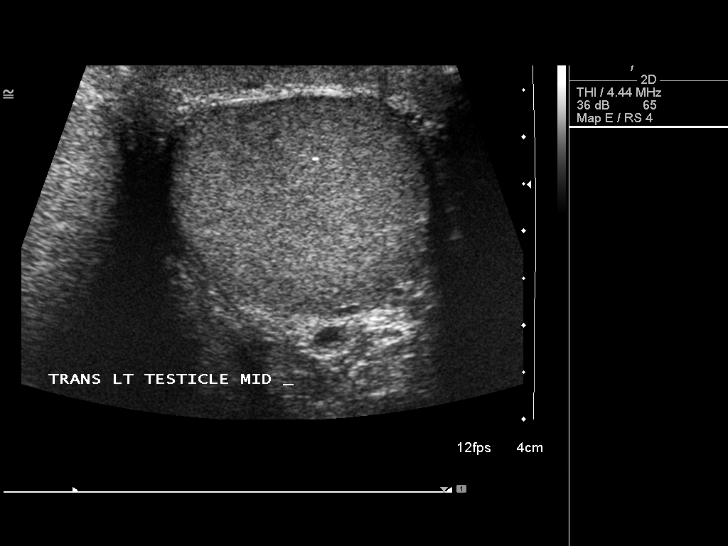
[im 44/48]
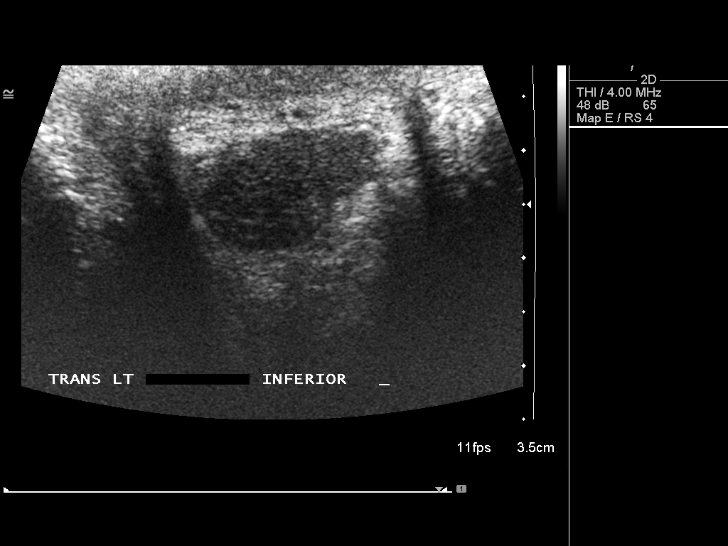
[im 48/48]
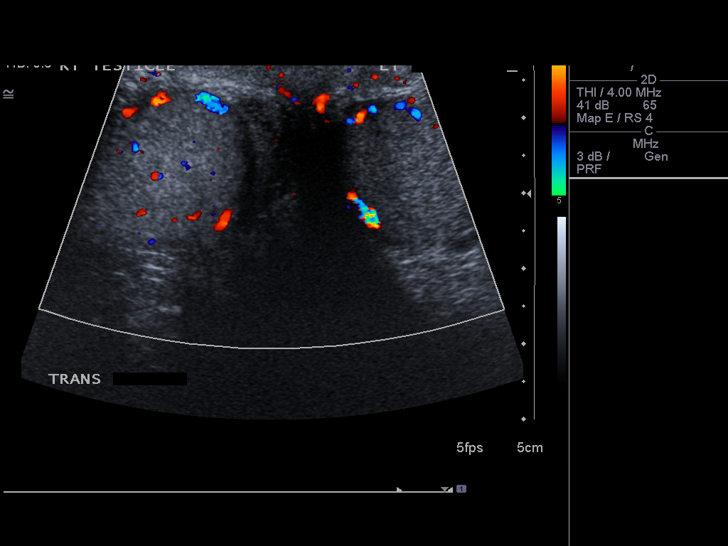

[14 of 25 positions shown; findings below may reference images not displayed]

FINDINGS: Right testis:  The right testicle is normal in size and
echogenicity.  Blood flow is demonstrated to the right testicle.  A
few small intratesticular echogenic foci are noted of doubtful
significance.

Left testis:  The left testicle is normal in size and echogenicity.
Blood flow is also demonstrated to the left testicle.  A single
echogenic focus is noted of doubtful significance.

Right epididymis:  There is a right epididymal cyst present near
the epididymal head of 8 x 10 x 9 mm, most likely accounting for
the palpable abnormality.

Left epididymis:  The left epididymis is unremarkable.

Hydrocele:  No hydrocele is seen.

Varicocele:  No varicocele is noted.
IMPRESSION: 1.  The area questioned clinically most likely corresponds to a
right epididymal cyst of 8 x 10 x 9 mm.
2.  No intratesticular abnormality is seen.  Blood flow is
demonstrated to both testicles.

## 2012-01-14 MED ORDER — ALPRAZOLAM 0.5 MG PO TABS: 0.5000 mg | ORAL_TABLET | Freq: Two times a day (BID) | ORAL | Status: DC | PRN

## 2012-01-14 NOTE — Patient Instructions (Addendum)
You had the flu shot today Continue all other medications as before You are given the xanax refill today Please go to LAB in the Basement for the blood and/or urine tests to be done today You will be contacted by phone if any changes need to be made immediately.  Otherwise, you will receive a letter about your results with an explanation, but please check with MyChart first. Thank you for enrolling in MyChart. Please follow the instructions below to securely access your online medical record. MyChart allows you to send messages to your doctor, view your test results, renew your prescriptions, schedule appointments, and more. To Log into MyChart, please go to [email or web address], and your Username is: Marcus Burns, pass Marcus Burns You will be contacted regarding the referral for: testicular ultrasound  (OK to see [facility] now to see if can get ordered soon) Please return in 1 year for your yearly visit, or sooner if needed, with Lab testing done 3-5 days before

## 2012-01-15 ENCOUNTER — Encounter: Payer: Self-pay | Admitting: Internal Medicine

## 2012-01-15 NOTE — Progress Notes (Signed)
Subjective:    Patient ID: Marcus Burns, male    DOB: October 01, 1980, 32 y.o.   MRN: [id number]  HPI  Here for wellness and f/u;  Overall doing ok;  Pt denies CP, worsening SOB, DOE, wheezing, orthopnea, PND, worsening LE edema, palpitations, dizziness or syncope.  Pt denies neurological change such as new headache, facial or extremity weakness.  Pt denies polydipsia, polyuria, or low sugar symptoms. Pt states overall good compliance with treatment and medications, good tolerability, and has been trying to follow lower cholesterol diet.  Pt denies worsening depressive symptoms, suicidal ideation or panic. No fever, night sweats, wt loss, loss of appetite, or other constitutional symptoms.  Pt states good ability with ADL's, has low fall risk, home safety reviewed and adequate, no other significant changes in hearing or vision, and only occasionally active with exercise.  Did notice recent mass near the upper pole of the right testicle, very much concerned abou this Past Medical History  Diagnosis Date  . Inguinal hernia   . Kidney stone   . Anxiety   . GERD (gastroesophageal reflux disease)   . Migraines   . Acute prostatitis 06/19/2007  . ALLERGIC RHINITIS 06/19/2007  . ANXIETY 06/19/2007  . ELEVATED BLOOD PRESSURE WITHOUT DIAGNOSIS OF HYPERTENSION 06/19/2007  . GERD 06/19/2007  . NEPHROLITHIASIS, HX OF 06/19/2007   Past Surgical History  Procedure Date  . Hernia repair     reports that he has quit smoking. He does not have any smokeless tobacco history on file. He reports that he does not drink alcohol or use illicit drugs. family history includes Anxiety disorder in his brother and mother; Coronary artery disease in his father; and Diabetes in his brother. No Known Allergies No current outpatient prescriptions on file prior to visit.   Review of Systems Constitutional: Negative for diaphoresis, activity change, appetite change or unexpected weight change.  HENT: Negative for hearing loss,  ear pain, facial swelling, mouth sores and neck stiffness.   Eyes: Negative for pain, redness and visual disturbance.  Respiratory: Negative for shortness of breath and wheezing.   Cardiovascular: Negative for chest pain and palpitations.  Gastrointestinal: Negative for diarrhea, blood in stool, abdominal distention or other pain Genitourinary: Negative for hematuria, flank pain or change in urine volume.  Musculoskeletal: Negative for myalgias and joint swelling.  Skin: Negative for color change and wound.  Neurological: Negative for syncope and numbness. other than noted Hematological: Negative for adenopathy.  Psychiatric/Behavioral: Negative for hallucinations, self-injury, decreased concentration and agitation.      Objective:   Physical Exam BP 142/92  Pulse 94  Temp 98 F (36.7 C) (Oral)  Ht 6\' 4"  (1.93 m)  Wt 333 lb 8 oz (151.275 kg)  BMI 40.59 kg/m2  SpO2 98% Physical Exam  VS noted,  Constitutional: Pt is oriented to person, place, and time. Appears well-developed and well-nourished.  Head: Normocephalic and atraumatic.  Right Ear: External ear normal.  Left Ear: External ear normal.  Nose: Nose normal.  Mouth/Throat: Oropharynx is clear and moist.  Eyes: Conjunctivae and EOM are normal. Pupils are equal, round, and reactive to light.  Neck: Normal range of motion. Neck supple. No JVD present. No tracheal deviation present.  Cardiovascular: Normal rate, regular rhythm, normal heart sounds and intact distal pulses.   Pulmonary/Chest: Effort normal and breath sounds normal.  Abdominal: Soft. Bowel sounds are normal. There is no tenderness. No HSM  Musculoskeletal: Normal range of motion. Exhibits no edema.  Lymphadenopathy:  Has no cervical  adenopathy.  Neurological: Pt is alert and oriented to person, place, and time. Pt has normal reflexes. No cranial nerve deficit.  Skin: Skin is warm and dry. No rash noted.  Psychiatric:  Has  normal mood and affect. Behavior is  normal.  Scrotum with prob epididymal firm nontender mass right upper testicle - o/w no mass, tender, swelling, penile d/c    Assessment & Plan:

## 2012-01-15 NOTE — Assessment & Plan Note (Signed)

## 2012-01-15 NOTE — Assessment & Plan Note (Signed)
prob cyst, for testicular u/s to confirm

## 2012-01-19 ENCOUNTER — Encounter: Payer: Self-pay | Admitting: Internal Medicine

## 2012-01-20 ENCOUNTER — Encounter: Payer: 59 | Admitting: Internal Medicine

## 2012-09-27 ENCOUNTER — Encounter (HOSPITAL_COMMUNITY): Payer: Self-pay | Admitting: Neurology

## 2012-09-27 ENCOUNTER — Emergency Department (HOSPITAL_COMMUNITY): Payer: 59

## 2012-09-27 ENCOUNTER — Emergency Department (HOSPITAL_COMMUNITY)
Admission: EM | Admit: 2012-09-27 | Discharge: 2012-09-27 | Disposition: A | Discharge status: Home / Self Care | Payer: 59 | Attending: Emergency Medicine | Admitting: Emergency Medicine

## 2012-09-27 ENCOUNTER — Ambulatory Visit: Payer: 59 | Admitting: Internal Medicine

## 2012-09-27 DIAGNOSIS — R109 Unspecified abdominal pain: Secondary | ICD-10-CM

## 2012-09-27 DIAGNOSIS — Z87891 Personal history of nicotine dependence: Secondary | ICD-10-CM | POA: Insufficient documentation

## 2012-09-27 DIAGNOSIS — R300 Dysuria: Secondary | ICD-10-CM | POA: Insufficient documentation

## 2012-09-27 DIAGNOSIS — F411 Generalized anxiety disorder: Secondary | ICD-10-CM | POA: Insufficient documentation

## 2012-09-27 DIAGNOSIS — N201 Calculus of ureter: Secondary | ICD-10-CM | POA: Insufficient documentation

## 2012-09-27 DIAGNOSIS — Z87448 Personal history of other diseases of urinary system: Secondary | ICD-10-CM | POA: Insufficient documentation

## 2012-09-27 DIAGNOSIS — Z87442 Personal history of urinary calculi: Secondary | ICD-10-CM | POA: Insufficient documentation

## 2012-09-27 DIAGNOSIS — N200 Calculus of kidney: Secondary | ICD-10-CM | POA: Insufficient documentation

## 2012-09-27 DIAGNOSIS — Z8719 Personal history of other diseases of the digestive system: Secondary | ICD-10-CM | POA: Insufficient documentation

## 2012-09-27 DIAGNOSIS — Z79899 Other long term (current) drug therapy: Secondary | ICD-10-CM | POA: Insufficient documentation

## 2012-09-27 DIAGNOSIS — Z8709 Personal history of other diseases of the respiratory system: Secondary | ICD-10-CM | POA: Insufficient documentation

## 2012-09-27 DIAGNOSIS — Z8669 Personal history of other diseases of the nervous system and sense organs: Secondary | ICD-10-CM | POA: Insufficient documentation

## 2012-09-27 LAB — CBC WITH DIFFERENTIAL/PLATELET
Basophils Absolute: 0 10*3/uL (ref 0.0–0.1)
Basophils Relative: 0 % (ref 0–1)
Eosinophils Absolute: 0.2 10*3/uL (ref 0.0–0.7)
Eosinophils Relative: 4 % (ref 0–5)
Hemoglobin: 13 g/dL (ref 13.0–17.0)
Lymphs Abs: 1.9 10*3/uL (ref 0.7–4.0)
MCH: 28.6 pg (ref 26.0–34.0)
MCHC: 33.4 g/dL (ref 30.0–36.0)
Monocytes Relative: 9 % (ref 3–12)
Neutro Abs: 2.5 10*3/uL (ref 1.7–7.7)
Neutrophils Relative %: 49 % (ref 43–77)
Platelets: 246 10*3/uL (ref 150–400)
WBC: 5 10*3/uL (ref 4.0–10.5)

## 2012-09-27 LAB — COMPREHENSIVE METABOLIC PANEL
ALT: 28 U/L (ref 0–53)
Albumin: 3.6 g/dL (ref 3.5–5.2)
Alkaline Phosphatase: 85 U/L (ref 39–117)
BUN: 13 mg/dL (ref 6–23)
Calcium: 9.6 mg/dL (ref 8.4–10.5)
Chloride: 102 mEq/L (ref 96–112)
Potassium: 4.4 mEq/L (ref 3.5–5.1)
Sodium: 138 mEq/L (ref 135–145)
Total Protein: 7.8 g/dL (ref 6.0–8.3)

## 2012-09-27 LAB — URINALYSIS, ROUTINE W REFLEX MICROSCOPIC
Bilirubin Urine: NEGATIVE
Glucose, UA: NEGATIVE mg/dL
Ketones, ur: NEGATIVE mg/dL
Leukocytes, UA: NEGATIVE
pH: 6.5 (ref 5.0–8.0)

## 2012-09-27 LAB — URINE MICROSCOPIC-ADD ON

## 2012-09-27 IMAGING — CT CT ABD-PELV W/O CM
2 of 4 series · 16 of 46 positions shown, 18 images · non-contrast
Comparison: [DATE]

CLINICAL DATA: Right flank pain. Urolithiasis.

EXAM:
CT ABDOMEN AND PELVIS WITHOUT CONTRAST
TECHNIQUE: Multidetector CT imaging of the abdomen and pelvis was performed
following the standard protocol without intravenous contrast.

[Series 2: abd/ pelvis 5.0 i30f 1 · axial · 0.98mm/px · z∈[+844,+1299]mm · 13 of 99 slices shown, 15 images]
[im 4/99  soft-tissue]
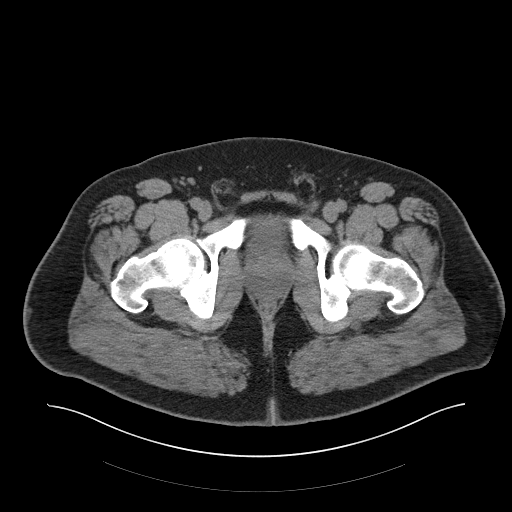
[im 4/99  bone]
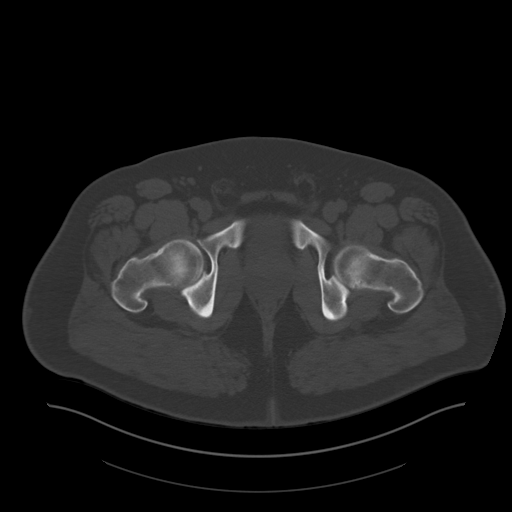
[im 12/99  soft-tissue]
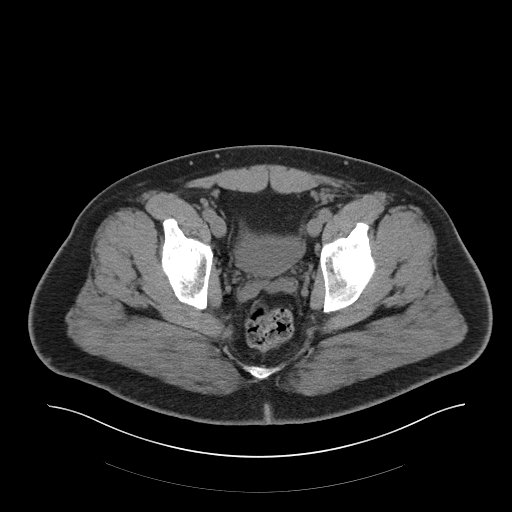
[im 20/99  soft-tissue]
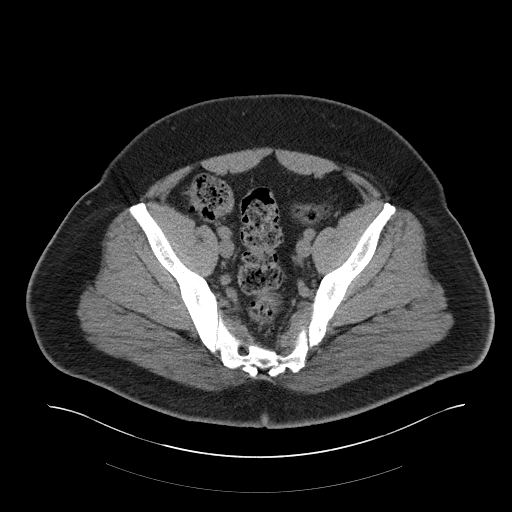
[im 28/99  soft-tissue]
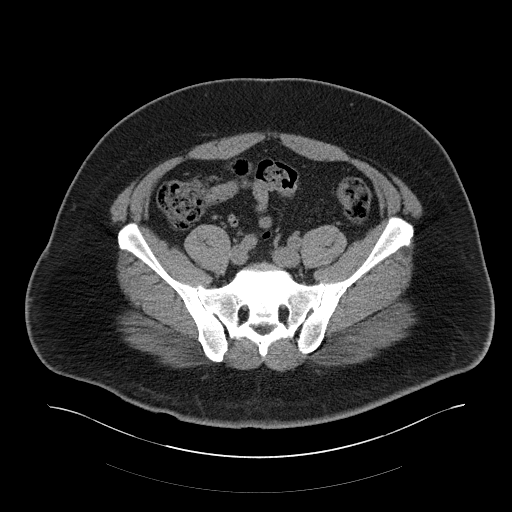
[im 36/99  soft-tissue]
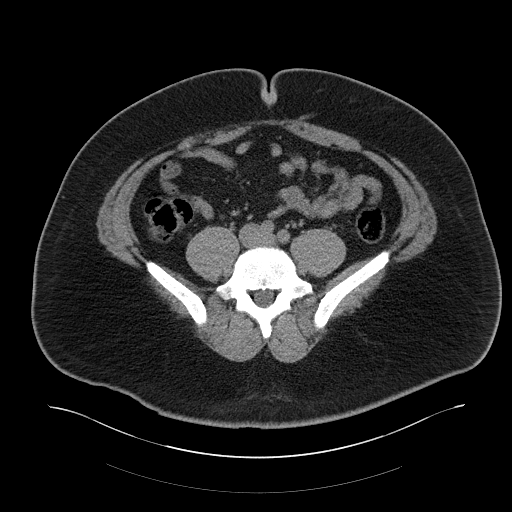
[im 44/99  soft-tissue]
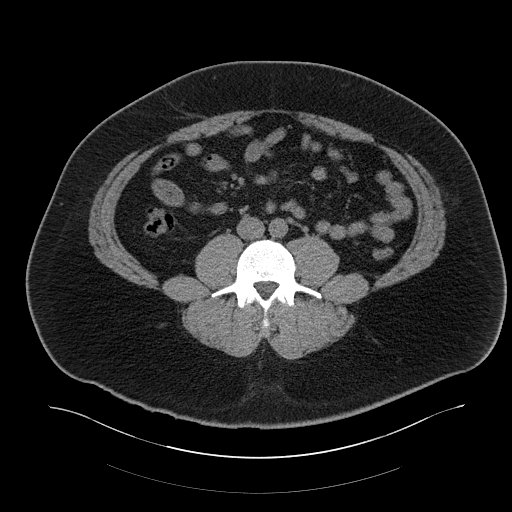
[im 51/99  soft-tissue]
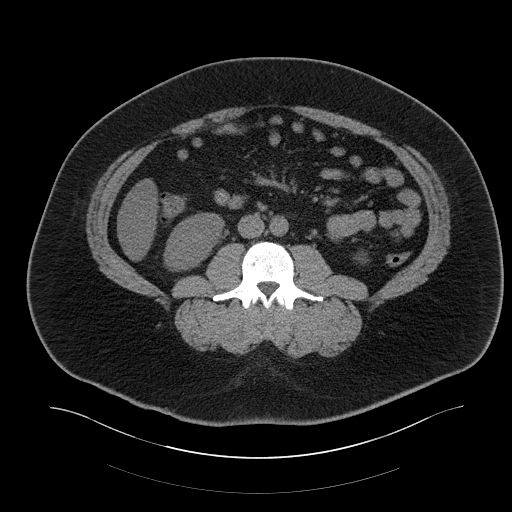
[im 55/99  soft-tissue]
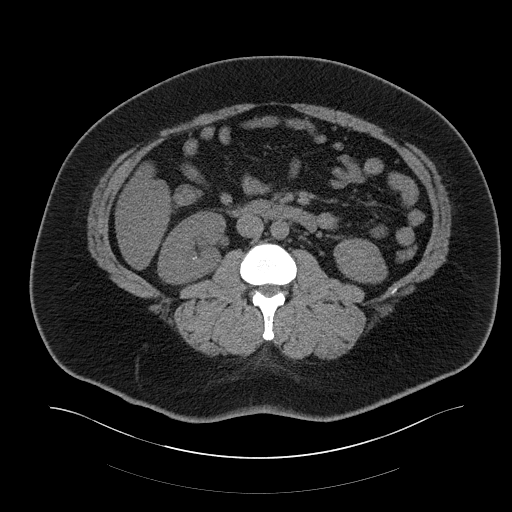
[im 63/99  soft-tissue]
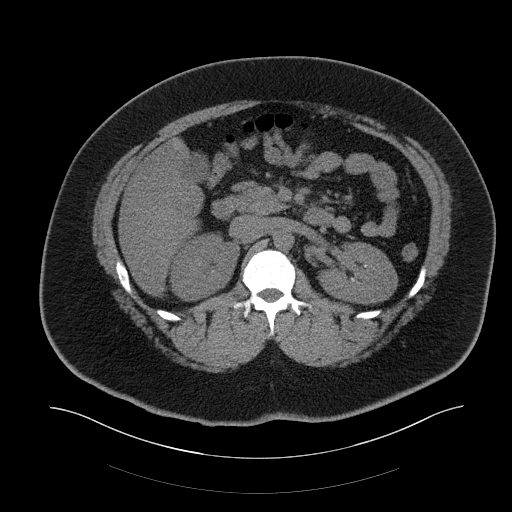
[im 63/99  bone]
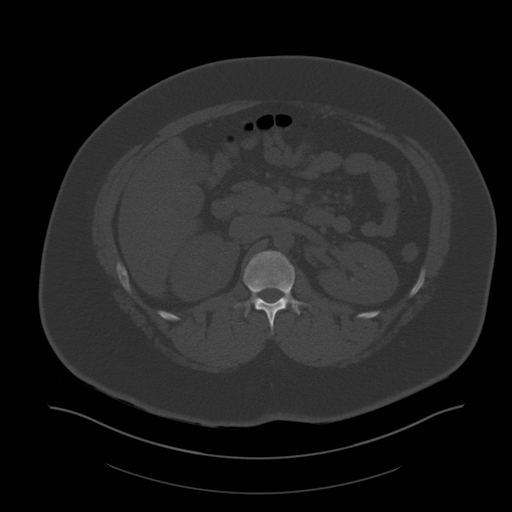
[im 71/99  soft-tissue]
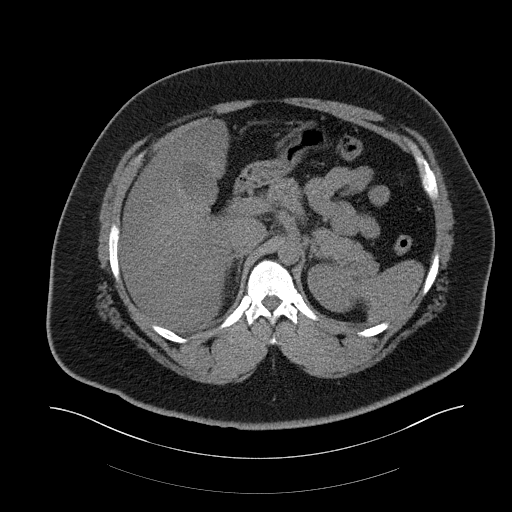
[im 79/99  soft-tissue]
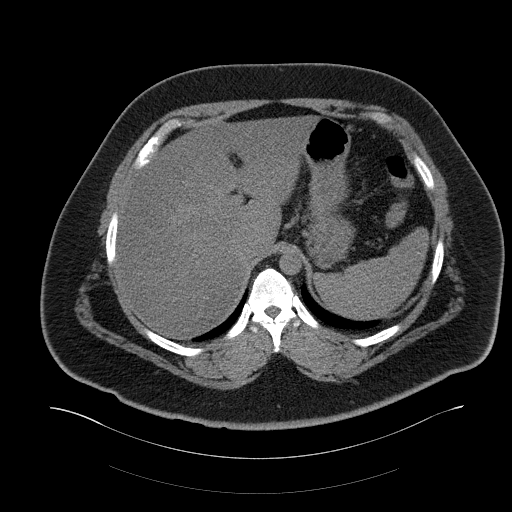
[im 87/99  soft-tissue]
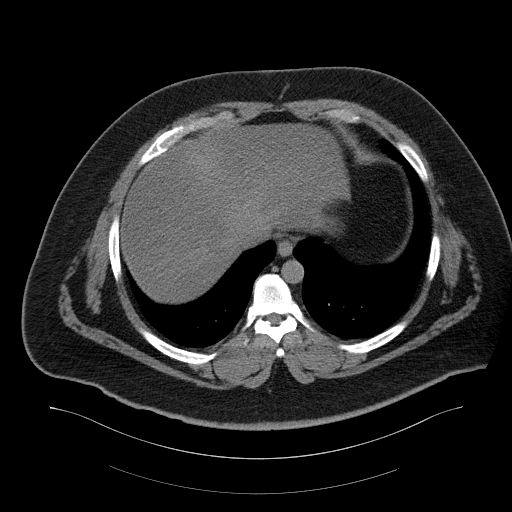
[im 95/99  soft-tissue]
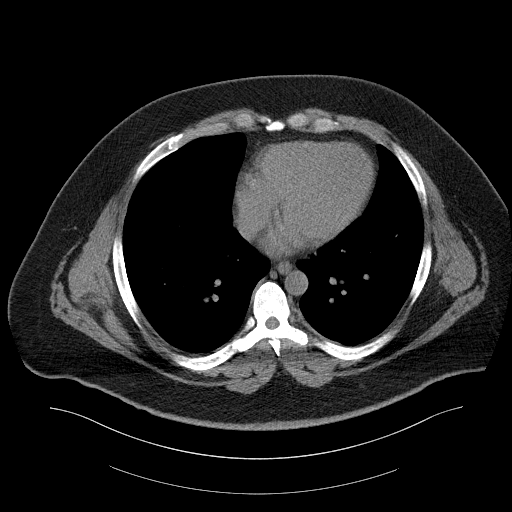

[Series 5: cor st · coronal · 0.96mm/px · 3 of 101 slices shown]
[im 34/101  soft-tissue]
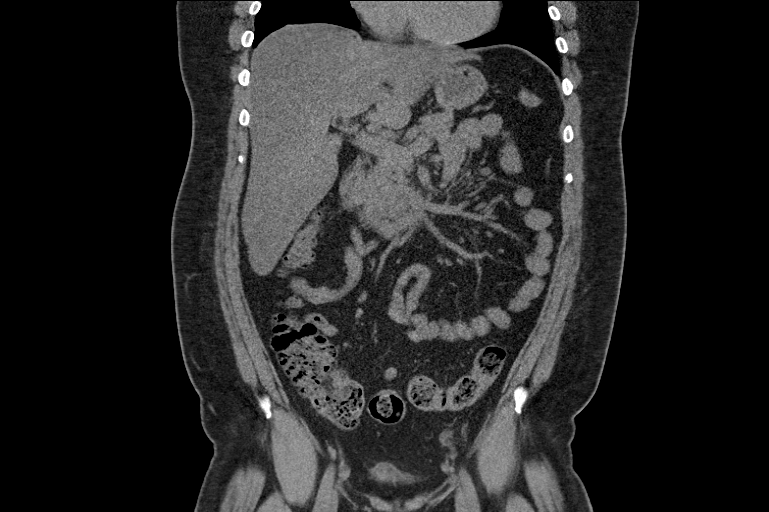
[im 45/101  soft-tissue]
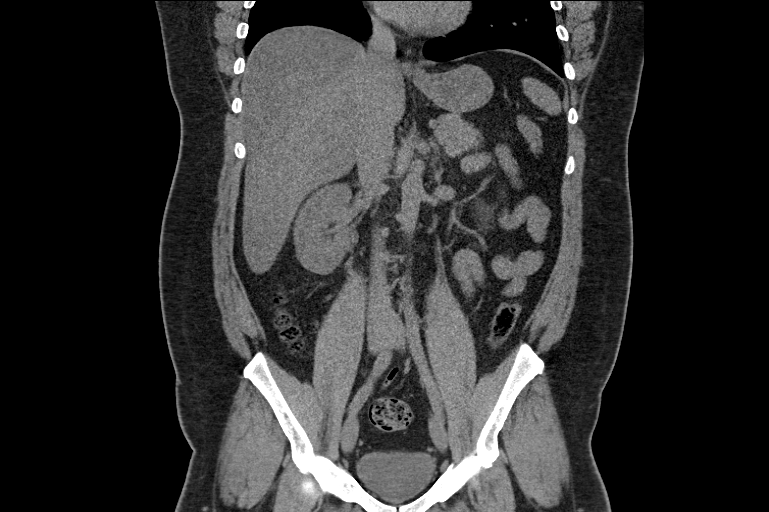
[im 56/101  soft-tissue]
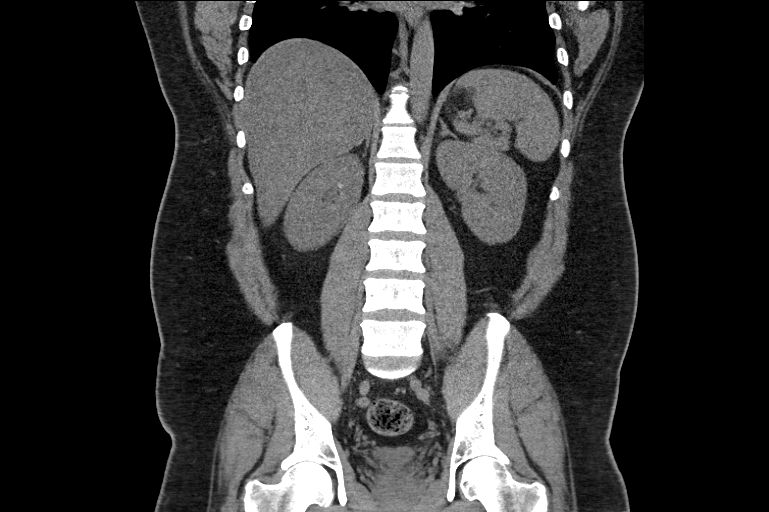

[16 of 46 positions shown; findings below may reference images not displayed]

FINDINGS: Several tiny less than 5 mm nonobstructive calculi are seen in both
kidneys. A tiny 1-2 mm calculus is seen in the mid portion of the
right ureter on image 66, with mild right-sided hydronephrosis. No
other ureteral calculi identified.

Hepatic steatosis again demonstrated. The other abdominal
parenchymal organs have a normal appearance on this noncontrast
study. No soft tissue mass or inflammatory process identified. No
evidence of dilated bowel loops or abnormal fluid collections.
IMPRESSION: Tiny 1-2 mm calculus in mid right ureter, with mild right-sided
hydronephrosis.

Bilateral nephrolithiasis.

Hepatic steatosis.

## 2012-09-27 MED ORDER — HYDROMORPHONE HCL PF 1 MG/ML IJ SOLN
1.0000 mg | Freq: Once | INTRAMUSCULAR | Status: AC
  Administered 2012-09-27: 1 mg via INTRAVENOUS
  Filled 2012-09-27: qty 1

## 2012-09-27 MED ORDER — OXYCODONE HCL 5 MG PO TABS: 5.0000 mg | ORAL_TABLET | ORAL | Status: DC | PRN

## 2012-09-27 MED ORDER — SODIUM CHLORIDE 0.9 % IV BOLUS (SEPSIS)
1000.0000 mL | Freq: Once | INTRAVENOUS | Status: AC
  Administered 2012-09-27: 1000 mL via INTRAVENOUS

## 2012-09-27 MED ORDER — KETOROLAC TROMETHAMINE 30 MG/ML IJ SOLN
30.0000 mg | Freq: Once | INTRAMUSCULAR | Status: AC
  Administered 2012-09-27: 30 mg via INTRAVENOUS
  Filled 2012-09-27: qty 1

## 2012-09-27 MED ORDER — TAMSULOSIN HCL 0.4 MG PO CAPS: 0.4000 mg | ORAL_CAPSULE | Freq: Every day | ORAL | Status: DC

## 2012-09-27 NOTE — ED Provider Notes (Signed)
Medical screening examination/treatment/procedure(s) were performed by non-physician practitioner and as supervising physician I was immediately available for consultation/collaboration.  Layla Maw Sudiksha Victor, DO 09/27/12 1553

## 2012-09-27 NOTE — ED Notes (Signed)
Patient transported to CT 

## 2012-09-27 NOTE — ED Provider Notes (Signed)
CSN: [id number]     Arrival date & time 09/27/12  0865 History   First MD Initiated Contact with Patient 09/27/12 0825     Chief Complaint  Patient presents with  . Flank Pain   (Consider location/radiation/quality/duration/timing/severity/associated sxs/prior Treatment) Patient is a 32 y.o. male presenting with flank pain. The history is provided by the patient and medical records.  Flank Pain  Patient presents to the ED for right flank pain x2 days.  States at first pain localized to his back but is now radiating to his right groin.  Associated sx include dysuria without hematuria. No testicle pain.  Patient does have a history of kidney stones, previously seen by [facility].  Stones have been analyzed, found to be Ca+ based. No recent fevers, sweats, or chills. No nausea, vomiting, or diarrhea.  Normal PO intake, notes he does drink a lot of soda and coffee with very little water.  Has taken ibuprofen at home with minimal relief.  Prior hernia repair, no other abdominal surgeries.  VS stable on arrival.  Past Medical History  Diagnosis Date  . Inguinal hernia   . Kidney stone   . Anxiety   . GERD (gastroesophageal reflux disease)   . Migraines   . Acute prostatitis 06/19/2007  . ALLERGIC RHINITIS 06/19/2007  . ANXIETY 06/19/2007  . ELEVATED BLOOD PRESSURE WITHOUT DIAGNOSIS OF HYPERTENSION 06/19/2007  . GERD 06/19/2007  . NEPHROLITHIASIS, HX OF 06/19/2007   Past Surgical History  Procedure Laterality Date  . Hernia repair     Family History  Problem Relation Age of Onset  . Anxiety disorder Mother   . Coronary artery disease Father   . Anxiety disorder Brother   . Diabetes Brother    History  Substance Use Topics  . Smoking status: Former Games developer  . Smokeless tobacco: Not on file  . Alcohol Use: No    Review of Systems  Genitourinary: Positive for dysuria and flank pain.  All other systems reviewed and are negative.    Allergies  Doxycycline  Home Medications    Current Outpatient Rx  Name  Route  Sig  Dispense  Refill  . ALPRAZolam (XANAX) 0.5 MG tablet   Oral   Take 0.5 mg by mouth at bedtime as needed for sleep or anxiety.         Marland Kitchen ibuprofen (ADVIL,MOTRIN) 200 MG tablet   Oral   Take 800 mg by mouth every 6 (six) hours as needed for pain.         . Tamsulosin HCl (FLOMAX PO)   Oral   Take 1 tablet by mouth daily as needed (pain).          BP 142/80  Pulse 71  Temp(Src) 98.1 F (36.7 C)  Resp 17  SpO2 99%  Physical Exam  Nursing note and vitals reviewed. Constitutional: He is oriented to person, place, and time. He appears well-developed and well-nourished. No distress.  Resting comfortably in bed  HENT:  Head: Normocephalic and atraumatic.  Mouth/Throat: Oropharynx is clear and moist.  Eyes: Conjunctivae and EOM are normal. Pupils are equal, round, and reactive to light.  Neck: Normal range of motion. Neck supple.  Cardiovascular: Normal rate, regular rhythm and normal heart sounds.   Pulmonary/Chest: Effort normal and breath sounds normal.  Abdominal: Soft. Bowel sounds are normal. There is tenderness. There is CVA tenderness.    Right flank pain with radiation to groin  Musculoskeletal: Normal range of motion.  Neurological: He is alert and  oriented to person, place, and time.  Skin: Skin is warm and dry. He is not diaphoretic.  Psychiatric: He has a normal mood and affect.    ED Course  Procedures (including critical care time) Labs Review Labs Reviewed  CBC WITH DIFFERENTIAL - Abnormal; Notable for the following:    HCT 38.9 (*)    All other components within normal limits  COMPREHENSIVE METABOLIC PANEL - Abnormal; Notable for the following:    Glucose, Bld 103 (*)    GFR calc non Af Amer 88 (*)    All other components within normal limits  URINALYSIS, ROUTINE W REFLEX MICROSCOPIC - Abnormal; Notable for the following:    APPearance HAZY (*)    Hgb urine dipstick LARGE (*)    All other components  within normal limits  URINE MICROSCOPIC-ADD ON   Imaging Review Ct Abdomen Pelvis Wo Contrast  09/27/2012   CLINICAL DATA:  Right flank pain. Urolithiasis.  EXAM: CT ABDOMEN AND PELVIS WITHOUT CONTRAST  TECHNIQUE: Multidetector CT imaging of the abdomen and pelvis was performed following the standard protocol without intravenous contrast.  COMPARISON:  09/07/2010  FINDINGS: Several tiny less than 5 mm nonobstructive calculi are seen in both kidneys. A tiny 1-2 mm calculus is seen in the mid portion of the right ureter on image 66, with mild right-sided hydronephrosis. No other ureteral calculi identified.  Hepatic steatosis again demonstrated. The other abdominal parenchymal organs have a normal appearance on this noncontrast study. No soft tissue mass or inflammatory process identified. No evidence of dilated bowel loops or abnormal fluid collections.  IMPRESSION: Tiny 1-2 mm calculus in mid right ureter, with mild right-sided hydronephrosis.  Bilateral nephrolithiasis.  Hepatic steatosis.   Electronically Signed   By: Myles Rosenthal   On: 09/27/2012 09:50    MDM   1. Right ureteral stone   2. Nephrolithiasis   3. Right flank pain     U/a with large blood but no signs of infection.  No leukocytosis.  CT as above-- right mid ureteral stone with mild hydronephrosis, bilateral nephrolithiasis.  Pt re-evaluated, pain greatly improved after dilaudid, now rated 1/10.  Pt afebrile, non-toxic appearing, NAD, VS stable- ok for discharge.  Rx flomax, oxycodone, and urine strainer.  Advised to strain urine for the next few days to monitor for stone passage.  Also encouraged to increase water intake.  FU with [facility] if problems occur.  Discussed plan with pt and wife, they agreed.  Return precautions advised.  Garlon Hatchet, PA-C 09/27/12 1130

## 2012-09-27 NOTE — ED Notes (Signed)
Family at bedside. 

## 2012-09-27 NOTE — ED Notes (Signed)
Pt reporting right flank pain since Monday night. Reports hx of kidney stones. Denies urinary s/s. Pt is a x 4 and ambulatory.

## 2012-11-09 ENCOUNTER — Other Ambulatory Visit: Payer: Self-pay

## 2014-03-12 ENCOUNTER — Emergency Department (HOSPITAL_COMMUNITY)
Admission: EM | Admit: 2014-03-12 | Discharge: 2014-03-12 | Disposition: A | Discharge status: Home / Self Care | Payer: 59 | Attending: Emergency Medicine | Admitting: Emergency Medicine

## 2014-03-12 ENCOUNTER — Encounter (HOSPITAL_COMMUNITY): Payer: Self-pay | Admitting: *Deleted

## 2014-03-12 ENCOUNTER — Emergency Department (HOSPITAL_COMMUNITY): Payer: 59

## 2014-03-12 DIAGNOSIS — K760 Fatty (change of) liver, not elsewhere classified: Secondary | ICD-10-CM | POA: Diagnosis not present

## 2014-03-12 DIAGNOSIS — R109 Unspecified abdominal pain: Secondary | ICD-10-CM | POA: Diagnosis present

## 2014-03-12 DIAGNOSIS — R61 Generalized hyperhidrosis: Secondary | ICD-10-CM | POA: Diagnosis not present

## 2014-03-12 DIAGNOSIS — Z79899 Other long term (current) drug therapy: Secondary | ICD-10-CM | POA: Diagnosis not present

## 2014-03-12 DIAGNOSIS — N200 Calculus of kidney: Secondary | ICD-10-CM | POA: Insufficient documentation

## 2014-03-12 DIAGNOSIS — I10 Essential (primary) hypertension: Secondary | ICD-10-CM | POA: Diagnosis not present

## 2014-03-12 DIAGNOSIS — F419 Anxiety disorder, unspecified: Secondary | ICD-10-CM | POA: Insufficient documentation

## 2014-03-12 DIAGNOSIS — Z87891 Personal history of nicotine dependence: Secondary | ICD-10-CM | POA: Diagnosis not present

## 2014-03-12 DIAGNOSIS — Z8709 Personal history of other diseases of the respiratory system: Secondary | ICD-10-CM | POA: Insufficient documentation

## 2014-03-12 DIAGNOSIS — Z8669 Personal history of other diseases of the nervous system and sense organs: Secondary | ICD-10-CM | POA: Insufficient documentation

## 2014-03-12 LAB — CBC WITH DIFFERENTIAL/PLATELET
BASOS ABS: 0 10*3/uL (ref 0.0–0.1)
Basophils Relative: 0 % (ref 0–1)
EOS PCT: 3 % (ref 0–5)
Eosinophils Absolute: 0.2 10*3/uL (ref 0.0–0.7)
HCT: 38.9 % — ABNORMAL LOW (ref 39.0–52.0)
HEMOGLOBIN: 13.2 g/dL (ref 13.0–17.0)
Lymphocytes Relative: 33 % (ref 12–46)
Lymphs Abs: 1.9 10*3/uL (ref 0.7–4.0)
MCH: 28.8 pg (ref 26.0–34.0)
MCHC: 33.9 g/dL (ref 30.0–36.0)
MCV: 84.9 fL (ref 78.0–100.0)
Monocytes Absolute: 0.4 10*3/uL (ref 0.1–1.0)
Monocytes Relative: 6 % (ref 3–12)
Neutro Abs: 3.4 10*3/uL (ref 1.7–7.7)
Neutrophils Relative %: 58 % (ref 43–77)
Platelets: 252 10*3/uL (ref 150–400)
RBC: 4.58 MIL/uL (ref 4.22–5.81)
RDW: 14.2 % (ref 11.5–15.5)
WBC: 5.8 10*3/uL (ref 4.0–10.5)

## 2014-03-12 LAB — URINALYSIS, ROUTINE W REFLEX MICROSCOPIC
Bilirubin Urine: NEGATIVE
GLUCOSE, UA: NEGATIVE mg/dL
Hgb urine dipstick: NEGATIVE
KETONES UR: NEGATIVE mg/dL
Leukocytes, UA: NEGATIVE
NITRITE: NEGATIVE
PH: 5.5 (ref 5.0–8.0)
Protein, ur: NEGATIVE mg/dL
SPECIFIC GRAVITY, URINE: 1.025 (ref 1.005–1.030)
UROBILINOGEN UA: 0.2 mg/dL (ref 0.0–1.0)

## 2014-03-12 LAB — COMPREHENSIVE METABOLIC PANEL
ALBUMIN: 3.6 g/dL (ref 3.5–5.2)
ALT: 30 U/L (ref 0–53)
AST: 30 U/L (ref 0–37)
Alkaline Phosphatase: 77 U/L (ref 39–117)
Anion gap: 7 (ref 5–15)
BILIRUBIN TOTAL: 1 mg/dL (ref 0.3–1.2)
BUN: 12 mg/dL (ref 6–23)
CHLORIDE: 104 mmol/L (ref 96–112)
CO2: 27 mmol/L (ref 19–32)
Calcium: 9.2 mg/dL (ref 8.4–10.5)
Creatinine, Ser: 1.27 mg/dL (ref 0.50–1.35)
GFR calc Af Amer: 84 mL/min — ABNORMAL LOW (ref >90)
GFR calc non Af Amer: 72 mL/min — ABNORMAL LOW (ref >90)
Glucose, Bld: 153 mg/dL — ABNORMAL HIGH (ref 70–99)
Potassium: 4 mmol/L (ref 3.5–5.1)
SODIUM: 138 mmol/L (ref 135–145)
Total Protein: 7.3 g/dL (ref 6.0–8.3)

## 2014-03-12 LAB — LIPASE, BLOOD: Lipase: 22 U/L (ref 11–59)

## 2014-03-12 IMAGING — CT CT ABD-PELV W/O CM
2 of 6 series · 14 of 46 positions shown, 18 images · non-contrast
Comparison: Prior CT scan of the abdomen and pelvis [DATE]

CLINICAL DATA: 33-year-old male with severe right flank pain and
history of renal stones.

EXAM:
CT ABDOMEN AND PELVIS WITHOUT CONTRAST
TECHNIQUE: Multidetector CT imaging of the abdomen and pelvis was performed
following the standard protocol without IV contrast.

[Series 2: stone study 5.0 i30f 1 · axial · 0.98mm/px · z∈[-637,-177]mm · 11 of 106 slices shown, 15 images]
[im 9/106  soft-tissue]
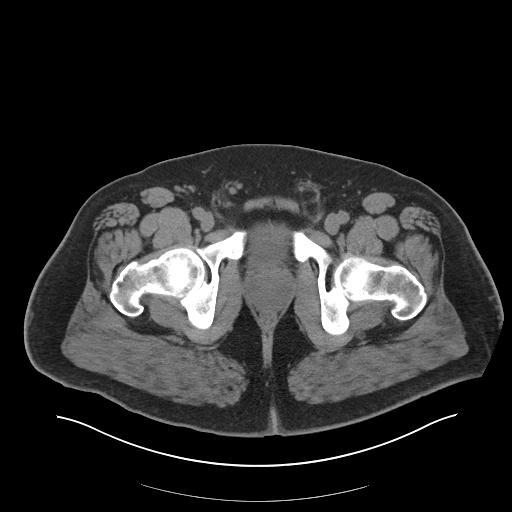
[im 9/106  bone]
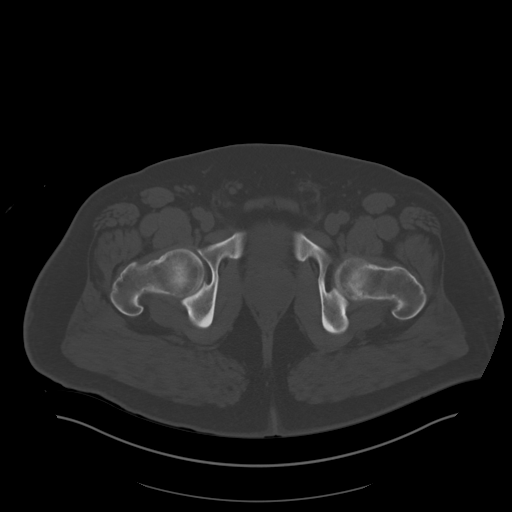
[im 22/106  soft-tissue]
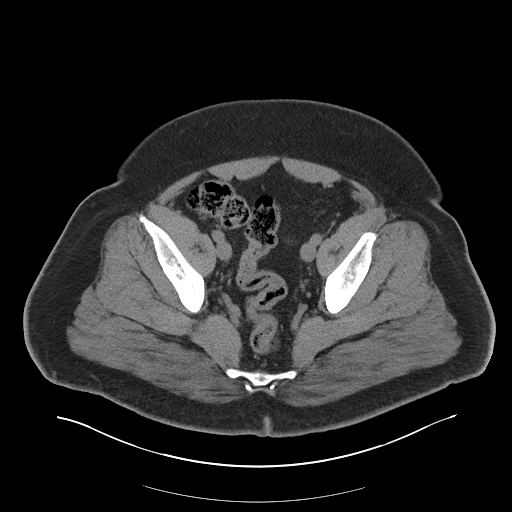
[im 30/106  soft-tissue]
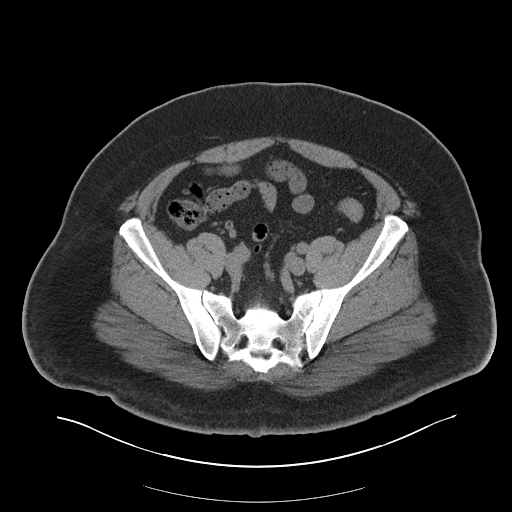
[im 43/106  soft-tissue]
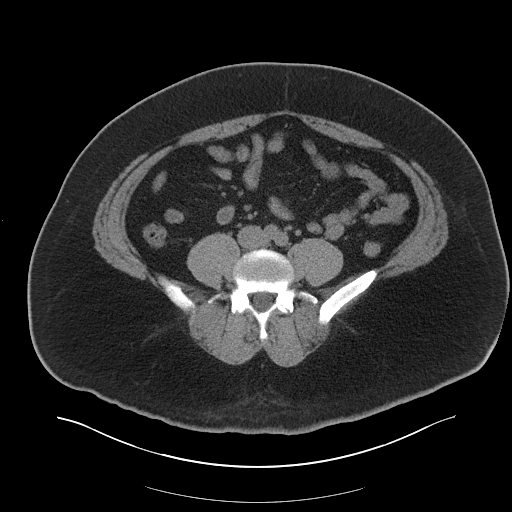
[im 55/106  soft-tissue]
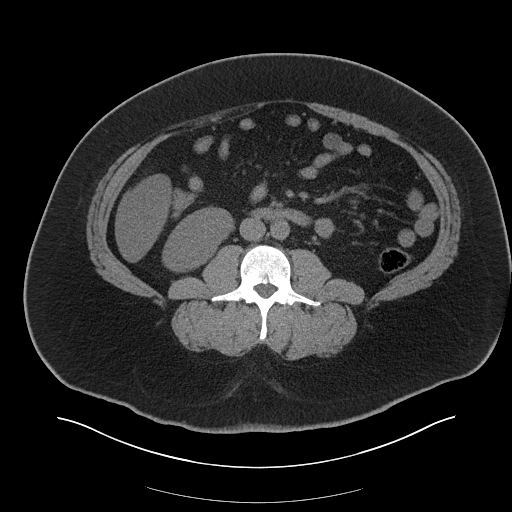
[im 64/106  soft-tissue]
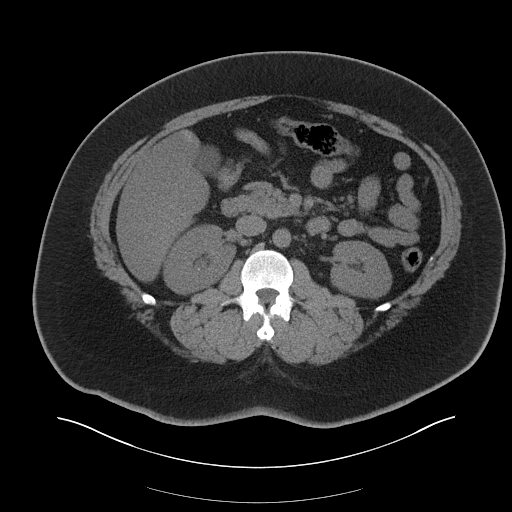
[im 76/106  soft-tissue]
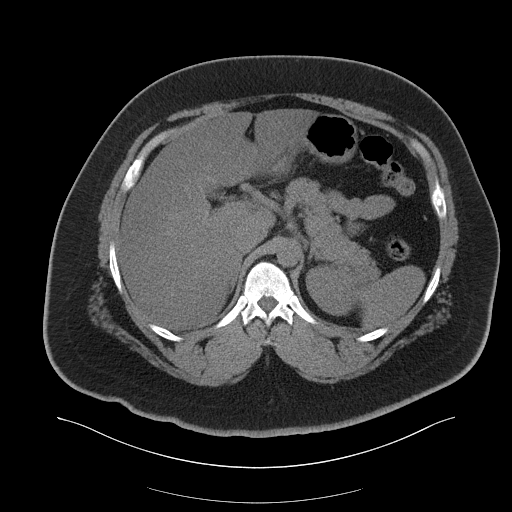
[im 89/106  soft-tissue]
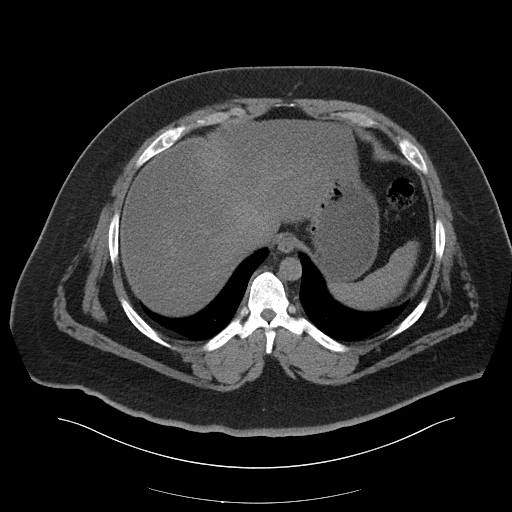
[im 89/106  lung]
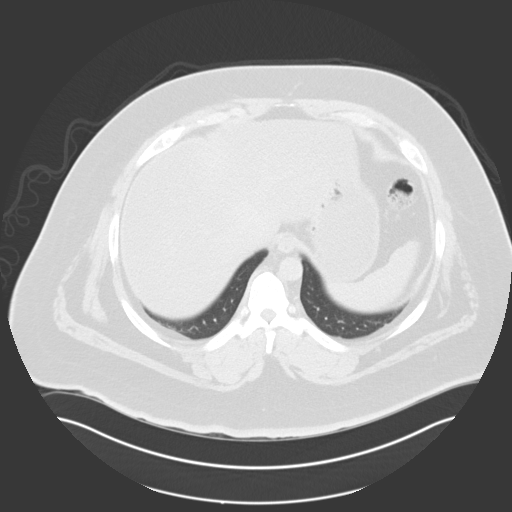
[im 93/106  lung]
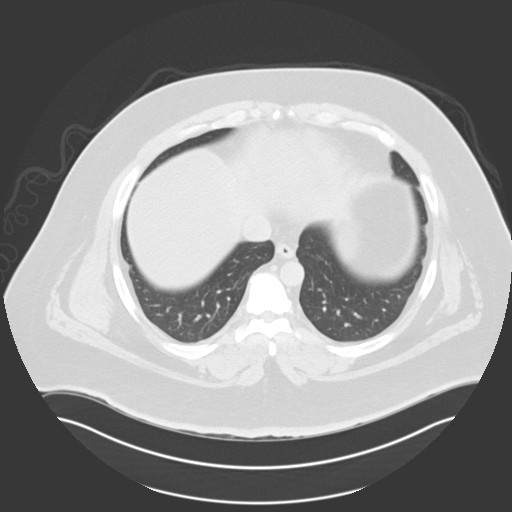
[im 97/106  soft-tissue]
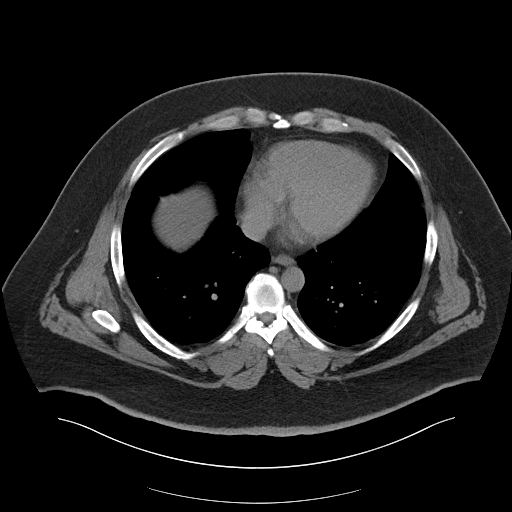
[im 97/106  lung]
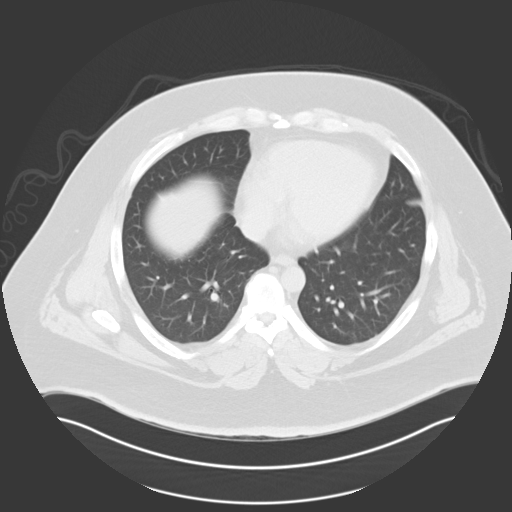
[im 97/106  bone]
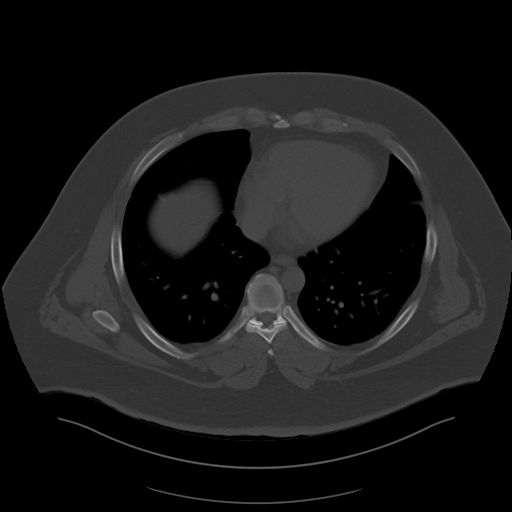
[im 101/106  lung]
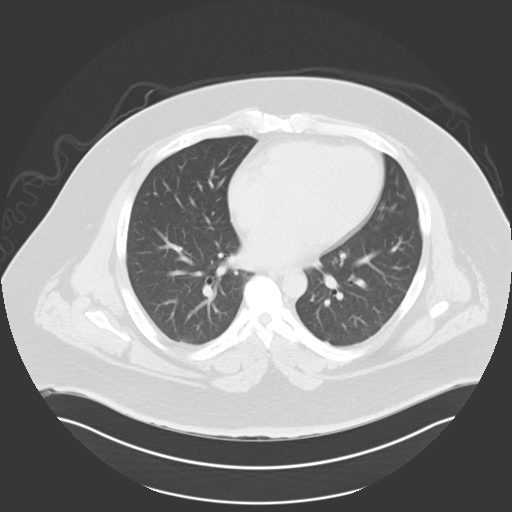

[Series 5: coronal soft tissue · coronal · 1.00mm/px · 3 of 101 slices shown]
[im 26/101  soft-tissue]
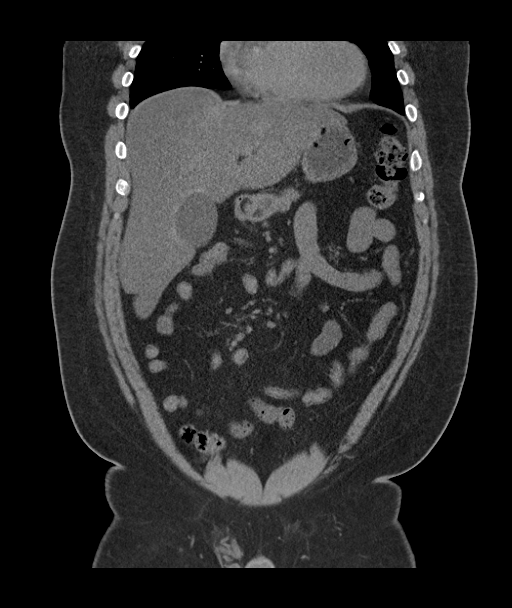
[im 51/101  soft-tissue]
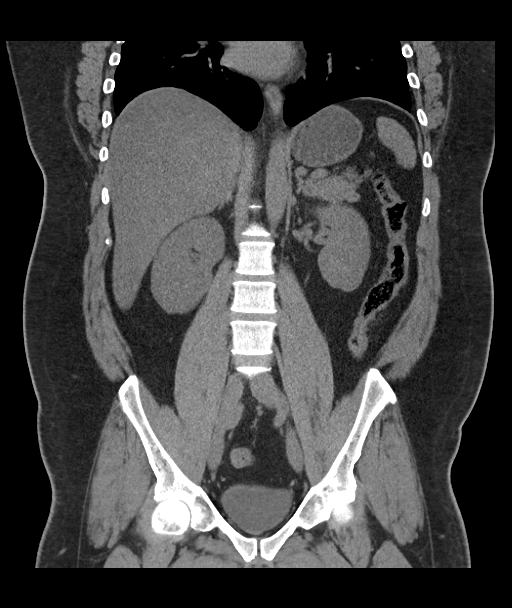
[im 76/101  soft-tissue]
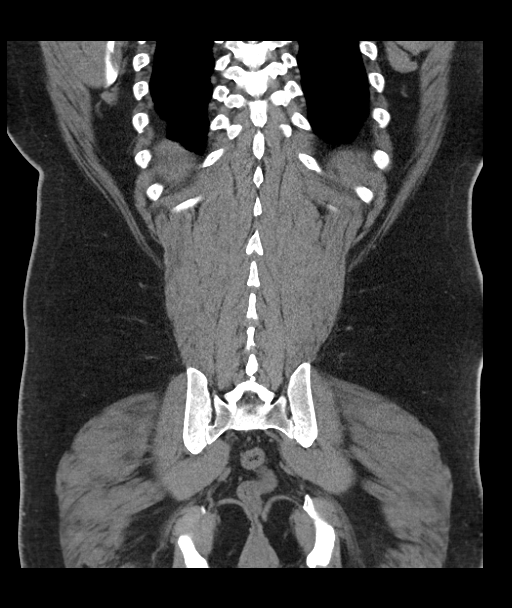

[14 of 46 positions shown; findings below may reference images not displayed]

FINDINGS: Lower Chest: The lung bases are clear. Visualized cardiac structures
are within normal limits for size. No pericardial effusion.
Unremarkable visualized distal thoracic esophagus.

Abdomen: Unenhanced CT was performed per clinician order. Lack of IV
contrast limits sensitivity and specificity, especially for
evaluation of abdominal/pelvic solid viscera. Within these
limitations, unremarkable CT appearance of the stomach, duodenum,
spleen, adrenal glands and pancreas.

Extremely low attenuation of the hepatic parenchyma with relative
sparing around the gallbladder fossa consistent with advanced
hepatic steatosis. Small amount of high attenuation material
layering within the gallbladder lumen consistent with sludge and/or
small stones. No intra or extrahepatic biliary ductal dilatation.

Multiple punctate renal stones bilaterally. At least 6 stones are
identified scattered throughout both the left and right renal
collecting systems. Stones are very small measuring 1-2 mm. A 2 mm
stone is present within the mid right ureter. No significant
hydronephrosis or ureterectasis.

No evidence of obstruction or focal bowel wall thickening. Normal
appendix in the right lower quadrant. The terminal ileum is
unremarkable. No free fluid or suspicious adenopathy.

Pelvis: Unremarkable bladder, prostate gland and seminal vesicles.
No free fluid or suspicious adenopathy.

Bones/Soft Tissues: No acute fracture or aggressive appearing lytic
or blastic osseous lesion.

Vascular: Limited evaluation in the absence of intravenous contrast.
No aneurysmal dilatation or calcified atherosclerotic plaque.
IMPRESSION: 1. Small 2 mm nonobstructing stone in the mid right ureter likely
accounts for the patient's acute right flank pain.
2. Numerous (at least 6 on the right, and 6 on the left) small 1-2
mm renal stones bilaterally.
3. Advanced hepatic steatosis.
4. Sludge and/or small stones layer within the gallbladder lumen.

## 2014-03-12 MED ORDER — ONDANSETRON HCL 4 MG/2ML IJ SOLN
4.0000 mg | Freq: Once | INTRAMUSCULAR | Status: AC
  Administered 2014-03-12: 4 mg via INTRAVENOUS
  Filled 2014-03-12: qty 2

## 2014-03-12 MED ORDER — HYDROMORPHONE HCL 1 MG/ML IJ SOLN
1.0000 mg | Freq: Once | INTRAMUSCULAR | Status: AC
  Administered 2014-03-12: 1 mg via INTRAVENOUS
  Filled 2014-03-12: qty 1

## 2014-03-12 MED ORDER — IBUPROFEN 800 MG PO TABS: 800.0000 mg | ORAL_TABLET | Freq: Three times a day (TID) | ORAL | Status: DC

## 2014-03-12 MED ORDER — OXYCODONE-ACETAMINOPHEN 5-325 MG PO TABS
1.0000 | ORAL_TABLET | Freq: Once | ORAL | Status: AC
  Administered 2014-03-12: 1 via ORAL
  Filled 2014-03-12: qty 1

## 2014-03-12 MED ORDER — TAMSULOSIN HCL 0.4 MG PO CAPS
0.4000 mg | ORAL_CAPSULE | Freq: Every day | ORAL | Status: DC
  Administered 2014-03-12: 0.4 mg via ORAL
  Filled 2014-03-12 (×2): qty 1

## 2014-03-12 MED ORDER — KETOROLAC TROMETHAMINE 30 MG/ML IJ SOLN
30.0000 mg | Freq: Once | INTRAMUSCULAR | Status: AC
  Administered 2014-03-12: 30 mg via INTRAVENOUS
  Filled 2014-03-12: qty 1

## 2014-03-12 MED ORDER — TAMSULOSIN HCL 0.4 MG PO CAPS: 0.4000 mg | ORAL_CAPSULE | Freq: Every day | ORAL | Status: DC

## 2014-03-12 MED ORDER — OXYCODONE-ACETAMINOPHEN 5-325 MG PO TABS: 1.0000 | ORAL_TABLET | ORAL | Status: DC | PRN

## 2014-03-12 NOTE — ED Notes (Signed)
Pain assessment is lower left back not abdomin.

## 2014-03-12 NOTE — ED Notes (Signed)
Patient transported to CT 

## 2014-03-12 NOTE — ED Notes (Signed)
Pt back from CT

## 2014-03-12 NOTE — ED Notes (Signed)
[phone] Marcus Burns-  Marcus Burns

## 2014-03-12 NOTE — Discharge Instructions (Signed)
Ibuprofen for pain. Percocet for severe pain. flomax to help you pass the stone. Follow up with urology. Return if worsening symptoms, vomiting, fever.   Kidney Stones Kidney stones (urolithiasis) are deposits that form inside your kidneys. The intense pain is caused by the stone moving through the urinary tract. When the stone moves, the ureter goes into spasm around the stone. The stone is usually passed in the urine.  CAUSES   A disorder that makes certain neck glands produce too much parathyroid hormone (primary hyperparathyroidism).  A buildup of uric acid crystals, similar to gout in your joints.  Narrowing (stricture) of the ureter.  A kidney obstruction present at birth (congenital obstruction).  Previous surgery on the kidney or ureters.  Numerous kidney infections. SYMPTOMS   Feeling sick to your stomach (nauseous).  Throwing up (vomiting).  Blood in the urine (hematuria).  Pain that usually spreads (radiates) to the groin.  Frequency or urgency of urination. DIAGNOSIS   Taking a history and physical exam.  Blood or urine tests.  CT scan.  Occasionally, an examination of the inside of the urinary bladder (cystoscopy) is performed. TREATMENT   Observation.  Increasing your fluid intake.  Extracorporeal shock wave lithotripsy--This is a noninvasive procedure that uses shock waves to break up kidney stones.  Surgery may be needed if you have severe pain or persistent obstruction. There are various surgical procedures. Most of the procedures are performed with the use of small instruments. Only small incisions are needed to accommodate these instruments, so recovery time is minimized. The size, location, and chemical composition are all important variables that will determine the proper choice of action for you. Talk to your health care provider to better understand your situation so that you will minimize the risk of injury to yourself and your kidney.  HOME CARE  INSTRUCTIONS   Drink enough water and fluids to keep your urine clear or pale yellow. This will help you to pass the stone or stone fragments.  Strain all urine through the provided strainer. Keep all particulate matter and stones for your health care provider to see. The stone causing the pain may be as small as a grain of salt. It is very important to use the strainer each and every time you pass your urine. The collection of your stone will allow your health care provider to analyze it and verify that a stone has actually passed. The stone analysis will often identify what you can do to reduce the incidence of recurrences.  Only take over-the-counter or prescription medicines for pain, discomfort, or fever as directed by your health care provider.  Make a follow-up appointment with your health care provider as directed.  Get follow-up X-rays if required. The absence of pain does not always mean that the stone has passed. It may have only stopped moving. If the urine remains completely obstructed, it can cause loss of kidney function or even complete destruction of the kidney. It is your responsibility to make sure X-rays and follow-ups are completed. Ultrasounds of the kidney can show blockages and the status of the kidney. Ultrasounds are not associated with any radiation and can be performed easily in a matter of minutes. SEEK MEDICAL CARE IF:  You experience pain that is progressive and unresponsive to any pain medicine you have been prescribed. SEEK IMMEDIATE MEDICAL CARE IF:   Pain cannot be controlled with the prescribed medicine.  You have a fever or shaking chills.  The severity or intensity of pain increases  over 18 hours and is not relieved by pain medicine.  You develop a new onset of abdominal pain.  You feel faint or pass out.  You are unable to urinate. MAKE SURE YOU:   Understand these instructions.  Will watch your condition.  Will get help right away if you are  not doing well or get worse. Document Released: 12/21/2004 Document Revised: 08/23/2012 Document Reviewed: 05/24/2012 [facility] Patient Information 2015 [place], [place]. This information is not intended to replace advice given to you by your health care provider. Make sure you discuss any questions you have with your health care provider.

## 2014-03-12 NOTE — ED Notes (Signed)
Pt states he started having extreme pain 10/10 right lower back.  Pt has hx of kidney stones and states it is the exact feeling.  Pt grimacing in pain.

## 2014-03-12 NOTE — ED Provider Notes (Signed)
CSN: [id number]     Arrival date & time 03/12/14  0815 History   First MD Initiated Contact with Patient 03/12/14 [phone]     Chief Complaint  Patient presents with  . Nephrolithiasis     (Consider location/radiation/quality/duration/timing/severity/associated sxs/prior Treatment) HPI Marcus Burns is a 34 y.o. male history of kidney stones, anxiety, migraines, hypertension, presents to emergency department complaining of sudden onset of right-sided back and abdominal pain patient states pain woke him up this morning around 6 AM. Pain is similar to prior kidney stones. Reports associated nausea, no vomiting. Had a normal bowel movement this morning. Denies any trouble urinating or hematuria. States last kidney stone over a year ago. No medications taken prior to coming in. States pain is sharp and stabbing. Constant but comes and goes in severity. There is no other complaints.   Past Medical History  Diagnosis Date  . Inguinal hernia   . Kidney stone   . Anxiety   . GERD (gastroesophageal reflux disease)   . Migraines   . Acute prostatitis 06/19/2007  . ALLERGIC RHINITIS 06/19/2007  . ANXIETY 06/19/2007  . ELEVATED BLOOD PRESSURE WITHOUT DIAGNOSIS OF HYPERTENSION 06/19/2007  . GERD 06/19/2007  . NEPHROLITHIASIS, HX OF 06/19/2007   Past Surgical History  Procedure Laterality Date  . Hernia repair     Family History  Problem Relation Age of Onset  . Anxiety disorder Mother   . Coronary artery disease Father   . Anxiety disorder Brother   . Diabetes Brother    History  Substance Use Topics  . Smoking status: Former Games developermoker  . Smokeless tobacco: Not on file  . Alcohol Use: No    Review of Systems  Constitutional: Negative for fever and chills.  Respiratory: Negative for cough, chest tightness and shortness of breath.   Cardiovascular: Negative for chest pain, palpitations and leg swelling.  Gastrointestinal: Positive for nausea, vomiting and abdominal pain. Negative for  diarrhea, constipation and abdominal distention.  Genitourinary: Positive for flank pain. Negative for dysuria, urgency, frequency, hematuria and difficulty urinating.  Musculoskeletal: Positive for back pain. Negative for myalgias, arthralgias, neck pain and neck stiffness.  Skin: Negative for rash.  Allergic/Immunologic: Negative for immunocompromised state.  Neurological: Negative for dizziness, weakness, light-headedness, numbness and headaches.  All other systems reviewed and are negative.     Allergies  Doxycycline  Home Medications   Prior to Admission medications   Medication Sig Start Date End Date Taking? Authorizing Provider  ALPRAZolam Prudy Feeler(XANAX) 0.5 MG tablet Take 0.5 mg by mouth at bedtime as needed for sleep or anxiety.    Historical Provider, MD  ibuprofen (ADVIL,MOTRIN) 200 MG tablet Take 800 mg by mouth every 6 (six) hours as needed for pain.    Historical Provider, MD  oxyCODONE (OXY IR/ROXICODONE) 5 MG immediate release tablet Take 1 tablet (5 mg total) by mouth every 4 (four) hours as needed for pain. 09/27/12   Garlon HatchetLisa M Sanders, PA-C  tamsulosin (FLOMAX) 0.4 MG CAPS capsule Take 1 capsule (0.4 mg total) by mouth daily after supper. 09/27/12   Garlon HatchetLisa M Sanders, PA-C  Tamsulosin HCl (FLOMAX PO) Take 1 tablet by mouth daily as needed (pain).    Historical Provider, MD   BP 140/76 mmHg  Pulse 90  Temp(Src) 98.1 F (36.7 C) (Oral)  Resp 20  Ht 6\' 4"  (1.93 m)  Wt 330 lb (149.687 kg)  BMI 40.19 kg/m2  SpO2 99% Physical Exam  Constitutional: He is oriented to person, place, and time. He  appears well-developed and well-nourished. No distress.  HENT:  Head: Normocephalic and atraumatic.  Eyes: Conjunctivae are normal.  Neck: Neck supple.  Cardiovascular: Normal rate, regular rhythm and normal heart sounds.   Pulmonary/Chest: Effort normal. No respiratory distress. He has no wheezes. He has no rales.  Abdominal: Soft. Bowel sounds are normal. He exhibits no distension.  There is tenderness. There is no rebound and no guarding.  No cva tenderness bilaterally. Right upper and lower abdominal tenderness  Musculoskeletal: He exhibits no edema.  Neurological: He is alert and oriented to person, place, and time.  Skin: Skin is warm. He is diaphoretic.  Nursing note and vitals reviewed.   ED Course  Procedures (including critical care time) Labs Review Labs Reviewed  CBC WITH DIFFERENTIAL/PLATELET - Abnormal; Notable for the following:    HCT 38.9 (*)    All other components within normal limits  COMPREHENSIVE METABOLIC PANEL - Abnormal; Notable for the following:    Glucose, Bld 153 (*)    GFR calc non Af Amer 72 (*)    GFR calc Af Amer 84 (*)    All other components within normal limits  LIPASE, BLOOD  URINALYSIS, ROUTINE W REFLEX MICROSCOPIC   CBC - all within normal   Imaging Review Ct Abdomen Pelvis Wo Contrast  03/12/2014   CLINICAL DATA:  34 year old male with severe right flank pain and history of renal stones.  EXAM: CT ABDOMEN AND PELVIS WITHOUT CONTRAST  TECHNIQUE: Multidetector CT imaging of the abdomen and pelvis was performed following the standard protocol without IV contrast.  COMPARISON:  Prior CT scan of the abdomen and pelvis 09/27/2012  FINDINGS: Lower Chest: The lung bases are clear. Visualized cardiac structures are within normal limits for size. No pericardial effusion. Unremarkable visualized distal thoracic esophagus.  Abdomen: Unenhanced CT was performed per clinician order. Lack of IV contrast limits sensitivity and specificity, especially for evaluation of abdominal/pelvic solid viscera. Within these limitations, unremarkable CT appearance of the stomach, duodenum, spleen, adrenal glands and pancreas.  Extremely low attenuation of the hepatic parenchyma with relative sparing around the gallbladder fossa consistent with advanced hepatic steatosis. Small amount of high attenuation material layering within the gallbladder lumen consistent  with sludge and/or small stones. No intra or extrahepatic biliary ductal dilatation.  Multiple punctate renal stones bilaterally. At least 6 stones are identified scattered throughout both the left and right renal collecting systems. Stones are very small measuring 1-2 mm. A 2 mm stone is present within the mid right ureter. No significant hydronephrosis or ureterectasis.  No evidence of obstruction or focal bowel wall thickening. Normal appendix in the right lower quadrant. The terminal ileum is unremarkable. No free fluid or suspicious adenopathy.  Pelvis: Unremarkable bladder, prostate gland and seminal vesicles. No free fluid or suspicious adenopathy.  Bones/Soft Tissues: No acute fracture or aggressive appearing lytic or blastic osseous lesion.  Vascular: Limited evaluation in the absence of intravenous contrast. No aneurysmal dilatation or calcified atherosclerotic plaque.  IMPRESSION: 1. Small 2 mm nonobstructing stone in the mid right ureter likely accounts for the patient's acute right flank pain. 2. Numerous (at least 6 on the right, and 6 on the left) small 1-2 mm renal stones bilaterally. 3. Advanced hepatic steatosis. 4. Sludge and/or small stones layer within the gallbladder lumen.   Electronically Signed   By: Malachy Moan M.D.   On: 03/12/2014 09:49     EKG Interpretation None      MDM   Final diagnoses:  Kidney stone  on right side  Fatty liver    Pt with right flank pain, also right upper and lower abdominal pain. Hx of kidney stones. Will get labs, CT abd pelvis, dilaudid  and toradol  ordered for pain.   Pain improved with medications. Labs and CT pending.  10:05 AM Pain back, will try another dose of dilaudid. CT showing 2mm stone in mid right ureter.  11:30 AM Delay in obtaining labs. All unremarkable except for glu of 153 UA pending.   12:45 AM UA normal with no signs of infection. Pt's pain controlled. Home with flomax, percocet, ibuprofen, follow up with  urology.   Filed Vitals:   03/12/14 1130 03/12/14 1200 03/12/14 1222 03/12/14 1300  BP: 135/75 108/85 122/81 117/77  Pulse: 61 62 73 84  Temp:    98.1 F (36.7 C)  TempSrc:      Resp:    16  Height:      Weight:      SpO2: 100% 100% 100% 95%     Jaynie Crumble, PA-C 03/12/14 1441  Doug Sou, MD 03/12/14 1723

## 2014-11-19 ENCOUNTER — Other Ambulatory Visit (INDEPENDENT_AMBULATORY_CARE_PROVIDER_SITE_OTHER): Payer: 59

## 2014-11-19 ENCOUNTER — Ambulatory Visit (INDEPENDENT_AMBULATORY_CARE_PROVIDER_SITE_OTHER): Payer: 59 | Admitting: Internal Medicine

## 2014-11-19 ENCOUNTER — Encounter: Payer: Self-pay | Admitting: Internal Medicine

## 2014-11-19 VITALS — BP 124/82 | HR 100 | Temp 98.6°F | Ht 76.0 in | Wt 341.0 lb

## 2014-11-19 DIAGNOSIS — R7989 Other specified abnormal findings of blood chemistry: Secondary | ICD-10-CM | POA: Diagnosis not present

## 2014-11-19 DIAGNOSIS — R739 Hyperglycemia, unspecified: Secondary | ICD-10-CM | POA: Insufficient documentation

## 2014-11-19 DIAGNOSIS — Z0000 Encounter for general adult medical examination without abnormal findings: Secondary | ICD-10-CM | POA: Diagnosis not present

## 2014-11-19 DIAGNOSIS — E785 Hyperlipidemia, unspecified: Secondary | ICD-10-CM | POA: Insufficient documentation

## 2014-11-19 LAB — BASIC METABOLIC PANEL
BUN: 12 mg/dL (ref 6–23)
CALCIUM: 10.3 mg/dL (ref 8.4–10.5)
CO2: 31 mEq/L (ref 19–32)
Chloride: 101 mEq/L (ref 96–112)
Creatinine, Ser: 1.1 mg/dL (ref 0.40–1.50)
GFR: 97.68 mL/min (ref >60.00)
GLUCOSE: 96 mg/dL (ref 70–99)
Potassium: 4.1 mEq/L (ref 3.5–5.1)
SODIUM: 141 meq/L (ref 135–145)

## 2014-11-19 LAB — CBC WITH DIFFERENTIAL/PLATELET
BASOS ABS: 0.1 10*3/uL (ref 0.0–0.1)
BASOS PCT: 1.3 % (ref 0.0–3.0)
EOS ABS: 0.2 10*3/uL (ref 0.0–0.7)
Eosinophils Relative: 3.4 % (ref 0.0–5.0)
HCT: 43.1 % (ref 39.0–52.0)
HEMOGLOBIN: 14.2 g/dL (ref 13.0–17.0)
Lymphocytes Relative: 25.7 % (ref 12.0–46.0)
Lymphs Abs: 1.5 10*3/uL (ref 0.7–4.0)
MCHC: 33 g/dL (ref 30.0–36.0)
MCV: 85.4 fl (ref 78.0–100.0)
MONO ABS: 0.5 10*3/uL (ref 0.1–1.0)
Monocytes Relative: 8.5 % (ref 3.0–12.0)
Neutro Abs: 3.5 10*3/uL (ref 1.4–7.7)
Neutrophils Relative %: 61.1 % (ref 43.0–77.0)
Platelets: 339 10*3/uL (ref 150.0–400.0)
RBC: 5.04 Mil/uL (ref 4.22–5.81)
RDW: 14.8 % (ref 11.5–15.5)
WBC: 5.7 10*3/uL (ref 4.0–10.5)

## 2014-11-19 LAB — URINALYSIS, ROUTINE W REFLEX MICROSCOPIC
Bilirubin Urine: NEGATIVE
Ketones, ur: NEGATIVE
Leukocytes, UA: NEGATIVE
NITRITE: NEGATIVE
Specific Gravity, Urine: 1.025 (ref 1.000–1.030)
TOTAL PROTEIN, URINE-UPE24: NEGATIVE
URINE GLUCOSE: NEGATIVE
Urobilinogen, UA: 0.2 (ref 0.0–1.0)
pH: 6 (ref 5.0–8.0)

## 2014-11-19 LAB — LIPID PANEL
CHOL/HDL RATIO: 6
Cholesterol: 209 mg/dL — ABNORMAL HIGH (ref 0–200)
HDL: 34.4 mg/dL — AB (ref >39.00)
NONHDL: 174.16
Triglycerides: 372 mg/dL — ABNORMAL HIGH (ref 0.0–149.0)
VLDL: 74.4 mg/dL — AB (ref 0.0–40.0)

## 2014-11-19 LAB — HEMOGLOBIN A1C: HEMOGLOBIN A1C: 6.2 % (ref 4.6–6.5)

## 2014-11-19 LAB — HEPATIC FUNCTION PANEL
ALBUMIN: 4.3 g/dL (ref 3.5–5.2)
ALK PHOS: 82 U/L (ref 39–117)
ALT: 26 U/L (ref 0–53)
AST: 19 U/L (ref 0–37)
BILIRUBIN TOTAL: 0.8 mg/dL (ref 0.2–1.2)
Bilirubin, Direct: 0.1 mg/dL (ref 0.0–0.3)
Total Protein: 8.6 g/dL — ABNORMAL HIGH (ref 6.0–8.3)

## 2014-11-19 LAB — TSH: TSH: 2.01 u[IU]/mL (ref 0.35–4.50)

## 2014-11-19 LAB — LDL CHOLESTEROL, DIRECT: Direct LDL: 103 mg/dL

## 2014-11-19 MED ORDER — ALPRAZOLAM 0.5 MG PO TABS: 0.5000 mg | ORAL_TABLET | Freq: Every evening | ORAL | Status: DC | PRN

## 2014-11-19 NOTE — Progress Notes (Signed)
Pre visit review using our clinic review tool, if applicable. No additional management support is needed unless otherwise documented below in the visit note. 

## 2014-11-19 NOTE — Progress Notes (Signed)
Subjective:    Patient ID: Marcus Burns, male    DOB: 11-08-1980, 34 y.o.   MRN: [id number]  HPI  Here for wellness and f/u;  Overall doing ok;  Pt denies Chest pain, worsening SOB, DOE, wheezing, orthopnea, PND, worsening LE edema, palpitations, dizziness or syncope.  Pt denies neurological change such as new headache, facial or extremity weakness.  Pt denies polydipsia, polyuria, or low sugar symptoms. Pt states overall good compliance with treatment and medications, good tolerability, and has been trying to follow appropriate diet.  Pt denies worsening depressive symptoms, suicidal ideation or panic. No fever, night sweats, wt loss, loss of appetite, or other constitutional symptoms.  Pt states good ability with ADL's, has low fall risk, home safety reviewed and adequate, no other significant changes in hearing or vision, and only occasionally active with exercise. Planning to try wt watchers for wt loss Wt Readings from Last 3 Encounters:  11/19/14 341 lb (154.677 kg)  03/12/14 330 lb (149.687 kg)  01/14/12 333 lb 8 oz (151.275 kg)   Past Medical History  Diagnosis Date  . Inguinal hernia   . Kidney stone   . Anxiety   . GERD (gastroesophageal reflux disease)   . Migraines   . Acute prostatitis 06/19/2007  . ALLERGIC RHINITIS 06/19/2007  . ANXIETY 06/19/2007  . ELEVATED BLOOD PRESSURE WITHOUT DIAGNOSIS OF HYPERTENSION 06/19/2007  . GERD 06/19/2007  . NEPHROLITHIASIS, HX OF 06/19/2007   Past Surgical History  Procedure Laterality Date  . Hernia repair      reports that he has quit smoking. He does not have any smokeless tobacco history on file. He reports that he does not drink alcohol or use illicit drugs. family history includes Anxiety disorder in his brother and mother; Coronary artery disease in his father; Diabetes in his brother. Allergies  Allergen Reactions  . Doxycycline Other (See Comments)    Light headed , passed out   Current Outpatient Prescriptions on File Prior  to Visit  Medication Sig Dispense Refill  . ibuprofen (ADVIL,MOTRIN) 800 MG tablet Take 1 tablet (800 mg total) by mouth 3 (three) times daily. 21 tablet 0  . Multiple Vitamin (ONE-A-DAY MENS PO) Take 1 tablet by mouth daily.    . Omega-3 Fatty Acids (FISH OIL CONCENTRATE PO) Take 1 capsule by mouth daily.     No current facility-administered medications on file prior to visit.   Review of Systems Constitutional: Negative for increased diaphoresis, other activity, appetite or siginficant weight change other than noted HENT: Negative for worsening hearing loss, ear pain, facial swelling, mouth sores and neck stiffness.   Eyes: Negative for other worsening pain, redness or visual disturbance.  Respiratory: Negative for shortness of breath and wheezing  Cardiovascular: Negative for chest pain and palpitations.  Gastrointestinal: Negative for diarrhea, blood in stool, abdominal distention or other pain Genitourinary: Negative for hematuria, flank pain or change in urine volume.  Musculoskeletal: Negative for myalgias or other joint complaints.  Skin: Negative for color change and wound or drainage.  Neurological: Negative for syncope and numbness. other than noted Hematological: Negative for adenopathy. or other swelling Psychiatric/Behavioral: Negative for hallucinations, SI, self-injury, decreased concentration or other worsening agitation.      Objective:   Physical Exam BP 124/82 mmHg  Pulse 100  Temp(Src) 98.6 F (37 C) (Oral)  Ht 6\' 4"  (1.93 m)  Wt 341 lb (154.677 kg)  BMI 41.53 kg/m2  SpO2 97% VS noted,  Constitutional: Pt is oriented to person, place,  and time. Appears well-developed and well-nourished, in no significant distress Head: Normocephalic and atraumatic.  Right Ear: External ear normal.  Left Ear: External ear normal.  Nose: Nose normal.  Mouth/Throat: Oropharynx is clear and moist.  Eyes: Conjunctivae and EOM are normal. Pupils are equal, round, and reactive to  light.  Neck: Normal range of motion. Neck supple. No JVD present. No tracheal deviation present or significant neck LA or mass Cardiovascular: Normal rate, regular rhythm, normal heart sounds and intact distal pulses.   Pulmonary/Chest: Effort normal and breath sounds without rales or wheezing  Abdominal: Soft. Bowel sounds are normal. NT. No HSM  Musculoskeletal: Normal range of motion. Exhibits no edema.  Lymphadenopathy:  Has no cervical adenopathy.  Neurological: Pt is alert and oriented to person, place, and time. Pt has normal reflexes. No cranial nerve deficit. Motor grossly intact Skin: Skin is warm and dry. No rash noted.  Psychiatric:  Has normal mood and affect. Behavior is normal.      Assessment & Plan:

## 2014-11-19 NOTE — Assessment & Plan Note (Signed)

## 2014-11-19 NOTE — Patient Instructions (Signed)

## 2014-11-19 NOTE — Assessment & Plan Note (Signed)
Likely reactive recently, for a1c given obesity r/o new onset DM

## 2015-11-19 ENCOUNTER — Encounter: Payer: 59 | Admitting: Internal Medicine

## 2016-02-27 ENCOUNTER — Telehealth: Payer: Self-pay | Admitting: Internal Medicine

## 2016-02-27 ENCOUNTER — Ambulatory Visit (INDEPENDENT_AMBULATORY_CARE_PROVIDER_SITE_OTHER): Payer: 59 | Admitting: Internal Medicine

## 2016-02-27 ENCOUNTER — Encounter: Payer: Self-pay | Admitting: Internal Medicine

## 2016-02-27 ENCOUNTER — Other Ambulatory Visit (INDEPENDENT_AMBULATORY_CARE_PROVIDER_SITE_OTHER): Payer: 59

## 2016-02-27 VITALS — BP 124/88 | HR 96 | Temp 97.6°F | Ht 76.0 in | Wt 346.0 lb

## 2016-02-27 DIAGNOSIS — E108 Type 1 diabetes mellitus with unspecified complications: Secondary | ICD-10-CM

## 2016-02-27 DIAGNOSIS — R739 Hyperglycemia, unspecified: Secondary | ICD-10-CM

## 2016-02-27 DIAGNOSIS — E785 Hyperlipidemia, unspecified: Secondary | ICD-10-CM

## 2016-02-27 DIAGNOSIS — Z0000 Encounter for general adult medical examination without abnormal findings: Secondary | ICD-10-CM | POA: Diagnosis not present

## 2016-02-27 LAB — LIPID PANEL
Cholesterol: 181 mg/dL (ref 0–200)
HDL: 34.1 mg/dL — AB (ref >39.00)
NONHDL: 146.89
TRIGLYCERIDES: 336 mg/dL — AB (ref 0.0–149.0)
Total CHOL/HDL Ratio: 5
VLDL: 67.2 mg/dL — AB (ref 0.0–40.0)

## 2016-02-27 LAB — HEPATIC FUNCTION PANEL
ALBUMIN: 4 g/dL (ref 3.5–5.2)
ALK PHOS: 76 U/L (ref 39–117)
ALT: 25 U/L (ref 0–53)
AST: 16 U/L (ref 0–37)
Bilirubin, Direct: 0.1 mg/dL (ref 0.0–0.3)
TOTAL PROTEIN: 7.8 g/dL (ref 6.0–8.3)
Total Bilirubin: 0.5 mg/dL (ref 0.2–1.2)

## 2016-02-27 LAB — URINALYSIS, ROUTINE W REFLEX MICROSCOPIC
BILIRUBIN URINE: NEGATIVE
Hgb urine dipstick: NEGATIVE
Ketones, ur: NEGATIVE
Leukocytes, UA: NEGATIVE
Nitrite: NEGATIVE
PH: 6.5 (ref 5.0–8.0)
SPECIFIC GRAVITY, URINE: 1.01 (ref 1.000–1.030)
TOTAL PROTEIN, URINE-UPE24: NEGATIVE
Urine Glucose: NEGATIVE
Urobilinogen, UA: 0.2 (ref 0.0–1.0)
WBC UA: NONE SEEN — AB (ref 0–?)

## 2016-02-27 LAB — BASIC METABOLIC PANEL
BUN: 14 mg/dL (ref 6–23)
CALCIUM: 9.8 mg/dL (ref 8.4–10.5)
CO2: 28 mEq/L (ref 19–32)
CREATININE: 1.03 mg/dL (ref 0.40–1.50)
Chloride: 103 mEq/L (ref 96–112)
GFR: 104.63 mL/min (ref >60.00)
Glucose, Bld: 106 mg/dL — ABNORMAL HIGH (ref 70–99)
Potassium: 4.2 mEq/L (ref 3.5–5.1)
Sodium: 139 mEq/L (ref 135–145)

## 2016-02-27 LAB — TSH: TSH: 1.75 u[IU]/mL (ref 0.35–4.50)

## 2016-02-27 LAB — LDL CHOLESTEROL, DIRECT: LDL DIRECT: 89 mg/dL

## 2016-02-27 LAB — HEMOGLOBIN A1C: Hgb A1c MFr Bld: 6.2 % (ref 4.6–6.5)

## 2016-02-27 NOTE — Progress Notes (Signed)
Subjective:    Patient ID: Marcus Burns, male    DOB: 1980-11-08, 36 y.o.   MRN: [id number]  HPI  Here for wellness and f/u;  Overall doing ok;  Pt denies Chest pain, worsening SOB, DOE, wheezing, orthopnea, PND, worsening LE edema, palpitations, dizziness or syncope.  Pt denies neurological change such as new headache, facial or extremity weakness.  Pt denies polydipsia, polyuria, or low sugar symptoms. Pt states overall good compliance with treatment and medications, good tolerability, and has been trying to follow appropriate diet.  Pt denies worsening depressive symptoms, suicidal ideation or panic. No fever, night sweats, wt loss, loss of appetite, or other constitutional symptoms.  Pt states good ability with ADL's, has low fall risk, home safety reviewed and adequate, no other significant changes in hearing or vision, and only occasionally active with exercise.  No recent kidney stone.  Has had more recently with job stress.  No new complaints except has cont'd to gain weight. Wt Readings from Last 3 Encounters:  02/27/16 (!) 346 lb (156.9 kg)  11/19/14 (!) 341 lb (154.7 kg)  03/12/14 (!) 330 lb (149.7 kg)   Past Medical History:  Diagnosis Date  . Acute prostatitis 06/19/2007  . ALLERGIC RHINITIS 06/19/2007  . Anxiety   . ANXIETY 06/19/2007  . ELEVATED BLOOD PRESSURE WITHOUT DIAGNOSIS OF HYPERTENSION 06/19/2007  . GERD 06/19/2007  . GERD (gastroesophageal reflux disease)   . Inguinal hernia   . Kidney stone   . Migraines   . NEPHROLITHIASIS, HX OF 06/19/2007   Past Surgical History:  Procedure Laterality Date  . HERNIA REPAIR      reports that he has quit smoking. He uses smokeless tobacco. He reports that he does not drink alcohol or use drugs. family history includes Anxiety disorder in his brother and mother; Coronary artery disease in his father; Diabetes in his brother. Allergies  Allergen Reactions  . Doxycycline Other (See Comments)    Light headed , passed out    Current Outpatient Prescriptions on File Prior to Visit  Medication Sig Dispense Refill  . ALPRAZolam (XANAX) 0.5 MG tablet Take 1 tablet (0.5 mg total) by mouth at bedtime as needed for sleep or anxiety. 30 tablet 2  . ibuprofen (ADVIL,MOTRIN) 800 MG tablet Take 1 tablet (800 mg total) by mouth 3 (three) times daily. 21 tablet 0  . Multiple Vitamin (ONE-A-DAY MENS PO) Take 1 tablet by mouth daily.    . Omega-3 Fatty Acids (FISH OIL CONCENTRATE PO) Take 1 capsule by mouth daily.     No current facility-administered medications on file prior to visit.    Review of Systems Constitutional: Negative for increased diaphoresis, or other activity, appetite or siginficant weight change other than noted HENT: Negative for worsening hearing loss, ear pain, facial swelling, mouth sores and neck stiffness.   Eyes: Negative for other worsening pain, redness or visual disturbance.  Respiratory: Negative for choking or stridor Cardiovascular: Negative for other chest pain and palpitations.  Gastrointestinal: Negative for worsening diarrhea, blood in stool, or abdominal distention Genitourinary: Negative for hematuria, flank pain or change in urine volume.  Musculoskeletal: Negative for myalgias or other joint complaints.  Skin: Negative for other color change and wound or drainage.  Neurological: Negative for syncope and numbness. other than noted Hematological: Negative for adenopathy. or other swelling Psychiatric/Behavioral: Negative for hallucinations, SI, self-injury, decreased concentration or other worsening agitation.  All other system neg per pt    Objective:   Physical Exam BP 124/88  Pulse 96   Temp 97.6 F (36.4 C)   Ht 6\' 4"  (1.93 m)   Wt (!) 346 lb (156.9 kg)   SpO2 98%   BMI 42.12 kg/m  VS noted, morbid obese Constitutional: Pt is oriented to person, place, and time. Appears well-developed and well-nourished, in no significant distress Head: Normocephalic and atraumatic   Eyes: Conjunctivae and EOM are normal. Pupils are equal, round, and reactive to light Right Ear: External ear normal.  Left Ear: External ear normal Nose: Nose normal.  Mouth/Throat: Oropharynx is clear and moist  Neck: Normal range of motion. Neck supple. No JVD present. No tracheal deviation present or significant neck LA or mass Cardiovascular: Normal rate, regular rhythm, normal heart sounds and intact distal pulses.   Pulmonary/Chest: Effort normal and breath sounds without rales or wheezing  Abdominal: Soft. Bowel sounds are normal. NT. No HSM  Musculoskeletal: Normal range of motion. Exhibits no edema Lymphadenopathy: Has no cervical adenopathy.  Neurological: Pt is alert and oriented to person, place, and time. Pt has normal reflexes. No cranial nerve deficit. Motor grossly intact Skin: Skin is warm and dry. No rash noted or new ulcers Psychiatric:  Has normal mood and affect. Behavior is normal.  No other new exam findings Lab Results  Component Value Date   WBC 5.7 11/19/2014   HGB 14.2 11/19/2014   HCT 43.1 11/19/2014   PLT 339.0 11/19/2014   GLUCOSE 106 (H) 02/27/2016   CHOL 181 02/27/2016   TRIG 336.0 (H) 02/27/2016   HDL 34.10 (L) 02/27/2016   LDLDIRECT 89.0 02/27/2016   ALT 25 02/27/2016   AST 16 02/27/2016   NA 139 02/27/2016   K 4.2 02/27/2016   CL 103 02/27/2016   CREATININE 1.03 02/27/2016   BUN 14 02/27/2016   CO2 28 02/27/2016   TSH 1.75 02/27/2016   HGBA1C 6.2 02/27/2016       Assessment & Plan:

## 2016-02-27 NOTE — Telephone Encounter (Signed)
Labs are ordered for today

## 2016-02-27 NOTE — Patient Instructions (Signed)
Please continue all other medications as before, and refills have been done if requested.  Please have the pharmacy call with any other refills you may need.  Please continue your efforts at being more active, low cholesterol diet, and weight control.  You are otherwise up to date with prevention measures today.  Please keep your appointments with your specialists as you may have planned  You lab work was done today  You will be contacted by phone if any changes need to be made immediately.  Otherwise, you will receive a letter about your results with an explanation, but please check with MyChart first.  Please remember to sign up for MyChart if you have not done so, as this will be important to you in the future with finding out test results, communicating by private email, and scheduling acute appointments online when needed.  Please return in 1 year for your yearly visit, or sooner if needed, with Lab testing done 3-5 days before

## 2016-02-28 NOTE — Assessment & Plan Note (Signed)
Mild,  Lab Results  Component Value Date   HGBA1C 6.2 02/27/2016   stable overall by history and exam, recent data reviewed with pt, and pt to continue medical treatment as before,  to f/u any worsening symptoms or concerns

## 2016-02-28 NOTE — Assessment & Plan Note (Signed)

## 2016-03-13 ENCOUNTER — Encounter: Payer: Self-pay | Admitting: Internal Medicine

## 2016-03-16 MED ORDER — ALPRAZOLAM 0.5 MG PO TABS: 0.5000 mg | ORAL_TABLET | Freq: Every evening | ORAL | 2 refills | Status: DC | PRN

## 2016-03-16 NOTE — Telephone Encounter (Signed)
Faxed script to [facility],.../lmb

## 2016-03-16 NOTE — Telephone Encounter (Signed)
Done hardcopy to Anna  

## 2017-03-02 ENCOUNTER — Encounter: Payer: 59 | Admitting: Internal Medicine

## 2017-04-25 ENCOUNTER — Encounter: Payer: 59 | Admitting: Internal Medicine

## 2017-04-25 DIAGNOSIS — Z0289 Encounter for other administrative examinations: Secondary | ICD-10-CM

## 2017-05-13 ENCOUNTER — Ambulatory Visit (INDEPENDENT_AMBULATORY_CARE_PROVIDER_SITE_OTHER): Payer: 59 | Admitting: Internal Medicine

## 2017-05-13 ENCOUNTER — Other Ambulatory Visit (INDEPENDENT_AMBULATORY_CARE_PROVIDER_SITE_OTHER): Payer: 59

## 2017-05-13 ENCOUNTER — Encounter: Payer: Self-pay | Admitting: Internal Medicine

## 2017-05-13 VITALS — BP 120/80 | HR 84 | Temp 98.8°F | Ht 76.0 in | Wt 352.8 lb

## 2017-05-13 DIAGNOSIS — F411 Generalized anxiety disorder: Secondary | ICD-10-CM | POA: Diagnosis not present

## 2017-05-13 DIAGNOSIS — R739 Hyperglycemia, unspecified: Secondary | ICD-10-CM | POA: Diagnosis not present

## 2017-05-13 DIAGNOSIS — Z23 Encounter for immunization: Secondary | ICD-10-CM | POA: Diagnosis not present

## 2017-05-13 DIAGNOSIS — Z0000 Encounter for general adult medical examination without abnormal findings: Secondary | ICD-10-CM | POA: Diagnosis not present

## 2017-05-13 DIAGNOSIS — M549 Dorsalgia, unspecified: Secondary | ICD-10-CM

## 2017-05-13 DIAGNOSIS — J309 Allergic rhinitis, unspecified: Secondary | ICD-10-CM | POA: Diagnosis not present

## 2017-05-13 LAB — URINALYSIS, ROUTINE W REFLEX MICROSCOPIC
BILIRUBIN URINE: NEGATIVE
HGB URINE DIPSTICK: NEGATIVE
KETONES UR: NEGATIVE
LEUKOCYTES UA: NEGATIVE
NITRITE: NEGATIVE
RBC / HPF: NONE SEEN (ref 0–?)
Specific Gravity, Urine: >=1.03 — AB (ref 1.000–1.030)
TOTAL PROTEIN, URINE-UPE24: NEGATIVE
URINE GLUCOSE: NEGATIVE
UROBILINOGEN UA: 0.2 (ref 0.0–1.0)
pH: 5.5 (ref 5.0–8.0)

## 2017-05-13 LAB — CBC WITH DIFFERENTIAL/PLATELET
Basophils Absolute: 0 10*3/uL (ref 0.0–0.1)
Basophils Relative: 0.6 % (ref 0.0–3.0)
Eosinophils Absolute: 0.2 10*3/uL (ref 0.0–0.7)
Eosinophils Relative: 3.2 % (ref 0.0–5.0)
HCT: 41.2 % (ref 39.0–52.0)
Hemoglobin: 14 g/dL (ref 13.0–17.0)
LYMPHS ABS: 2.4 10*3/uL (ref 0.7–4.0)
Lymphocytes Relative: 37.3 % (ref 12.0–46.0)
MCHC: 33.9 g/dL (ref 30.0–36.0)
MCV: 85.1 fl (ref 78.0–100.0)
Monocytes Absolute: 0.5 10*3/uL (ref 0.1–1.0)
Monocytes Relative: 8.1 % (ref 3.0–12.0)
NEUTROS ABS: 3.3 10*3/uL (ref 1.4–7.7)
NEUTROS PCT: 50.8 % (ref 43.0–77.0)
PLATELETS: 314 10*3/uL (ref 150.0–400.0)
RBC: 4.85 Mil/uL (ref 4.22–5.81)
RDW: 14.9 % (ref 11.5–15.5)
WBC: 6.5 10*3/uL (ref 4.0–10.5)

## 2017-05-13 LAB — HEPATIC FUNCTION PANEL
ALT: 26 U/L (ref 0–53)
AST: 16 U/L (ref 0–37)
Albumin: 4.2 g/dL (ref 3.5–5.2)
Alkaline Phosphatase: 78 U/L (ref 39–117)
BILIRUBIN DIRECT: 0.1 mg/dL (ref 0.0–0.3)
BILIRUBIN TOTAL: 0.8 mg/dL (ref 0.2–1.2)
Total Protein: 8.4 g/dL — ABNORMAL HIGH (ref 6.0–8.3)

## 2017-05-13 LAB — TSH: TSH: 1.88 u[IU]/mL (ref 0.35–4.50)

## 2017-05-13 LAB — LIPID PANEL
CHOL/HDL RATIO: 6
CHOLESTEROL: 190 mg/dL (ref 0–200)
HDL: 32.3 mg/dL — AB (ref >39.00)
NonHDL: 157.43
TRIGLYCERIDES: 388 mg/dL — AB (ref 0.0–149.0)
VLDL: 77.6 mg/dL — AB (ref 0.0–40.0)

## 2017-05-13 LAB — LDL CHOLESTEROL, DIRECT: Direct LDL: 88 mg/dL

## 2017-05-13 LAB — BASIC METABOLIC PANEL
BUN: 11 mg/dL (ref 6–23)
CO2: 30 mEq/L (ref 19–32)
Calcium: 10.1 mg/dL (ref 8.4–10.5)
Chloride: 103 mEq/L (ref 96–112)
Creatinine, Ser: 1.1 mg/dL (ref 0.40–1.50)
GFR: 96.35 mL/min (ref >60.00)
Glucose, Bld: 97 mg/dL (ref 70–99)
Potassium: 4.3 mEq/L (ref 3.5–5.1)
Sodium: 140 mEq/L (ref 135–145)

## 2017-05-13 LAB — HEMOGLOBIN A1C: Hgb A1c MFr Bld: 6.3 % (ref 4.6–6.5)

## 2017-05-13 MED ORDER — TRIAMCINOLONE ACETONIDE 55 MCG/ACT NA AERO: 2.0000 | INHALATION_SPRAY | Freq: Every day | NASAL | 12 refills | Status: DC

## 2017-05-13 MED ORDER — ALPRAZOLAM 0.5 MG PO TABS: 0.5000 mg | ORAL_TABLET | Freq: Every evening | ORAL | 5 refills | Status: DC | PRN

## 2017-05-13 MED ORDER — CETIRIZINE HCL 10 MG PO TABS
10.0000 mg | ORAL_TABLET | Freq: Every day | ORAL | 11 refills | Status: DC
Start: 2017-05-13 — End: 2019-02-01

## 2017-05-13 MED ORDER — MELOXICAM 15 MG PO TABS: 15.0000 mg | ORAL_TABLET | Freq: Every day | ORAL | 3 refills | Status: DC

## 2017-05-13 MED ORDER — CYCLOBENZAPRINE HCL 5 MG PO TABS: 5.0000 mg | ORAL_TABLET | Freq: Three times a day (TID) | ORAL | 3 refills | Status: DC | PRN

## 2017-05-13 NOTE — Progress Notes (Signed)
Subjective:    Patient ID: Marcus Burns, male    DOB: May 29, 1980, 37 y.o.   MRN: [id number]  HPI  Here for wellness and f/u;  Overall doing ok;  Pt denies Chest pain, worsening SOB, DOE, wheezing, orthopnea, PND, worsening LE edema, palpitations, dizziness or syncope.  Pt denies neurological change such as new headache, facial or extremity weakness.  Pt denies polydipsia, polyuria, or low sugar symptoms. Pt states overall good compliance with treatment and medications, good tolerability, and has been trying to follow appropriate diet.  Pt denies worsening depressive symptoms, suicidal ideation or panic. No fever, night sweats, wt loss, loss of appetite, or other constitutional symptoms.  Pt states good ability with ADL's, has low fall risk, home safety reviewed and adequate, no other significant changes in hearing or vision, and not active with exercise, but plans to do better. Just recently is trying to work with Systems analyst and plant based diet to get wt back down Wt Readings from Last 3 Encounters:  05/13/17 (!) 352 lb 12 oz (160 kg)  02/27/16 (!) 346 lb (156.9 kg)  11/19/14 (!) 341 lb (154.7 kg)  Sits at desk 8 hrs per day, has post neck and upper back - asks for nsaid and muscle relaxer.  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  Does have several wks ongoing nasal allergy symptoms with clearish congestion, itch and sneezing, without fever, pain, ST, cough, swelling or wheezing. Past Medical History:  Diagnosis Date  . Acute prostatitis 06/19/2007  . ALLERGIC RHINITIS 06/19/2007  . Anxiety   . ANXIETY 06/19/2007  . ELEVATED BLOOD PRESSURE WITHOUT DIAGNOSIS OF HYPERTENSION 06/19/2007  . GERD 06/19/2007  . GERD (gastroesophageal reflux disease)   . Inguinal hernia   . Kidney stone   . Migraines   . NEPHROLITHIASIS, HX OF 06/19/2007   Past Surgical History:  Procedure  Laterality Date  . HERNIA REPAIR      reports that he has quit smoking. He uses smokeless tobacco. He reports that he does not drink alcohol or use drugs. family history includes Anxiety disorder in his brother and mother; Coronary artery disease in his father; Diabetes in his brother. Allergies  Allergen Reactions  . Doxycycline Other (See Comments)    Light headed , passed out   Current Outpatient Medications on File Prior to Visit  Medication Sig Dispense Refill  . BLACK CURRANT SEED OIL PO Take by mouth.    Marland Kitchen ibuprofen (ADVIL,MOTRIN) 800 MG tablet Take 1 tablet (800 mg total) by mouth 3 (three) times daily. 21 tablet 0  . Multiple Vitamin (ONE-A-DAY MENS PO) Take 1 tablet by mouth daily.    . Omega-3 Fatty Acids (FISH OIL CONCENTRATE PO) Take 1 capsule by mouth daily.     No current facility-administered medications on file prior to visit.    Review of Systems Constitutional: Negative for other unusual diaphoresis, sweats, appetite or weight changes HENT: Negative for other worsening hearing loss, ear pain, facial swelling, mouth sores or neck stiffness.   Eyes: Negative for other worsening pain, redness or other visual disturbance.  Respiratory: Negative for other stridor or swelling Cardiovascular: Negative for other palpitations or other chest pain  Gastrointestinal: Negative for worsening diarrhea or loose stools, blood in stool, distention or other pain Genitourinary: Negative for hematuria, flank pain or other change in urine volume.  Musculoskeletal: Negative for  myalgias or other joint swelling.  Skin: Negative for other color change, or other wound or worsening drainage.  Neurological: Negative for other syncope or numbness. Hematological: Negative for other adenopathy or swelling Psychiatric/Behavioral: Negative for hallucinations, other worsening agitation, SI, self-injury, or new decreased concentration ALl other system neg per pt    Objective:   Physical Exam BP  120/80 (BP Location: Left Arm, Patient Position: Sitting, Cuff Size: Large)   Pulse 84   Temp 98.8 F (37.1 C) (Oral)   Ht  (1.93 m)   Wt (!) 352 lb 12 oz (160 kg)   SpO2 98%   BMI 42.94 kg/m  VS noted, morbid obese Constitutional: Pt is oriented to person, place, and time. Appears well-developed and well-nourished, in no significant distress and comfortable Head: Normocephalic and atraumatic  Eyes: Conjunctivae and EOM are normal. Pupils are equal, round, and reactive to light Bilat tm's with mild erythema.  Max sinus areas non tender.  Pharynx with mild erythema, no exudate Right Ear: External ear normal without discharge Left Ear: External ear normal without discharge Nose: Nose without discharge or deformity Mouth/Throat: Oropharynx is without other ulcerations and moist  Neck: Normal range of motion. Neck supple. No JVD present. No tracheal deviation present or significant neck LA or mass Cardiovascular: Normal rate, regular rhythm, normal heart sounds and intact distal pulses.   Pulmonary/Chest: WOB normal and breath sounds without rales or wheezing  Abdominal: Soft. Bowel sounds are normal. NT. No HSM  Musculoskeletal: Normal range of motion. Exhibits no edema Lymphadenopathy: Has no other cervical adenopathy.  Neurological: Pt is alert and oriented to person, place, and time. Pt has normal reflexes. No cranial nerve deficit. Motor grossly intact, Gait intact Skin: Skin is warm and dry. No rash noted or new ulcerations Psychiatric:  Has normal mood and affect. Behavior is normal without agitation No other exam findings       Assessment & Plan:

## 2017-05-13 NOTE — Patient Instructions (Addendum)
You had the Prevnar pneumonia shot today  Please take all new medication as prescribed - the anti-inflammatory, and muscle relaxer as needed, and the zyrtec and nasacort for allergies,  Please continue all other medications as before, and refills have been done if requested - the xanax  Please have the pharmacy call with any other refills you may need.  Please continue your efforts at being more active, low cholesterol diet, and weight control.  You are otherwise up to date with prevention measures today.  Please keep your appointments with your specialists as you may have planned  Please go to the LAB in the Basement (turn left off the elevator) for the tests to be done today  You will be contacted by phone if any changes need to be made immediately.  Otherwise, you will receive a letter about your results with an explanation, but please check with MyChart first.  Please remember to sign up for MyChart if you have not done so, as this will be important to you in the future with finding out test results, communicating by private email, and scheduling acute appointments online when needed.  Please return in 1 year for your yearly visit, or sooner if needed, with Lab testing done 3-5 days before

## 2017-05-14 DIAGNOSIS — M549 Dorsalgia, unspecified: Secondary | ICD-10-CM | POA: Insufficient documentation

## 2017-05-14 NOTE — Assessment & Plan Note (Signed)
Stable, cont same tx 

## 2017-05-14 NOTE — Assessment & Plan Note (Signed)
C/w msk strain with overuse - for nsaid prn, muscle relaxer prn

## 2017-05-14 NOTE — Assessment & Plan Note (Signed)

## 2017-05-14 NOTE — Assessment & Plan Note (Signed)
Lab Results  Component Value Date   HGBA1C 6.3 05/13/2017  stable overall by history and exam, recent data reviewed with pt, and pt to continue medical treatment as before,  to f/u any worsening symptoms or concerns

## 2017-05-14 NOTE — Assessment & Plan Note (Signed)
Ok for zyrtec and nasacort asd,  to f/u any worsening symptoms or concerns 

## 2018-05-18 ENCOUNTER — Encounter: Payer: 59 | Admitting: Internal Medicine

## 2018-06-07 ENCOUNTER — Encounter: Payer: Self-pay | Admitting: Internal Medicine

## 2018-08-16 ENCOUNTER — Emergency Department (HOSPITAL_COMMUNITY): Payer: No Typology Code available for payment source

## 2018-08-16 ENCOUNTER — Other Ambulatory Visit: Payer: Self-pay

## 2018-08-16 ENCOUNTER — Emergency Department (HOSPITAL_COMMUNITY)
Admission: EM | Admit: 2018-08-16 | Discharge: 2018-08-16 | Disposition: A | Discharge status: Home / Self Care | Payer: No Typology Code available for payment source | Attending: Emergency Medicine | Admitting: Emergency Medicine

## 2018-08-16 ENCOUNTER — Encounter (HOSPITAL_COMMUNITY): Payer: Self-pay | Admitting: Emergency Medicine

## 2018-08-16 DIAGNOSIS — N23 Unspecified renal colic: Secondary | ICD-10-CM | POA: Diagnosis not present

## 2018-08-16 DIAGNOSIS — Z87891 Personal history of nicotine dependence: Secondary | ICD-10-CM | POA: Diagnosis not present

## 2018-08-16 DIAGNOSIS — R1011 Right upper quadrant pain: Secondary | ICD-10-CM | POA: Diagnosis present

## 2018-08-16 DIAGNOSIS — N200 Calculus of kidney: Secondary | ICD-10-CM | POA: Diagnosis not present

## 2018-08-16 LAB — URINALYSIS, ROUTINE W REFLEX MICROSCOPIC
Bilirubin Urine: NEGATIVE
Glucose, UA: NEGATIVE mg/dL
Ketones, ur: 20 mg/dL — AB
Leukocytes,Ua: NEGATIVE
Nitrite: NEGATIVE
Protein, ur: NEGATIVE mg/dL
Specific Gravity, Urine: 1.019 (ref 1.005–1.030)
pH: 5 (ref 5.0–8.0)

## 2018-08-16 LAB — BASIC METABOLIC PANEL
Anion gap: 14 (ref 5–15)
BUN: 16 mg/dL (ref 6–20)
CO2: 24 mmol/L (ref 22–32)
Calcium: 9.8 mg/dL (ref 8.9–10.3)
Chloride: 101 mmol/L (ref 98–111)
Creatinine, Ser: 1.47 mg/dL — ABNORMAL HIGH (ref 0.61–1.24)
GFR calc Af Amer: >60 mL/min (ref >60)
GFR calc non Af Amer: 59 mL/min — ABNORMAL LOW (ref >60)
Glucose, Bld: 120 mg/dL — ABNORMAL HIGH (ref 70–99)
Potassium: 3.8 mmol/L (ref 3.5–5.1)
Sodium: 139 mmol/L (ref 135–145)

## 2018-08-16 LAB — CBC
HCT: 43.2 % (ref 39.0–52.0)
Hemoglobin: 14 g/dL (ref 13.0–17.0)
MCH: 29.3 pg (ref 26.0–34.0)
MCHC: 32.4 g/dL (ref 30.0–36.0)
MCV: 90.4 fL (ref 80.0–100.0)
Platelets: 258 10*3/uL (ref 150–400)
RBC: 4.78 MIL/uL (ref 4.22–5.81)
RDW: 14.5 % (ref 11.5–15.5)
WBC: 5.5 10*3/uL (ref 4.0–10.5)
nRBC: 0 % (ref 0.0–0.2)

## 2018-08-16 IMAGING — CT CT RENAL STONE PROTOCOL
2 of 4 series · 17 of 46 positions shown, 19 images · non-contrast
Comparison: [DATE]

CLINICAL DATA: Right flank pain with stone disease suspected

EXAM:
CT ABDOMEN AND PELVIS WITHOUT CONTRAST
TECHNIQUE: Multidetector CT imaging of the abdomen and pelvis was performed
following the standard protocol without IV contrast.

[Series 3: renal stone 5.0 · axial · 0.98mm/px · z∈[+992,+1472]mm · 14 of 106 slices shown, 16 images]
[im 5/106  soft-tissue]
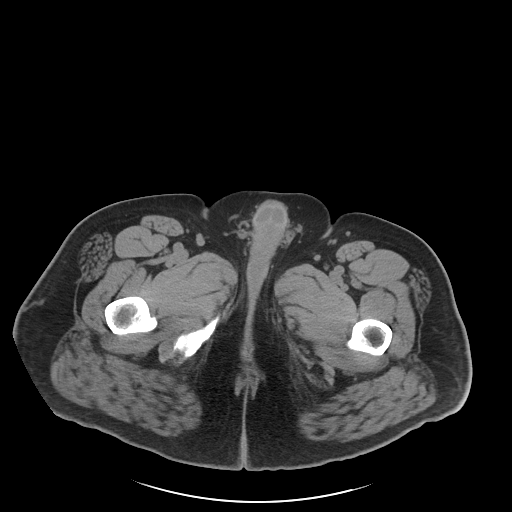
[im 5/106  bone]
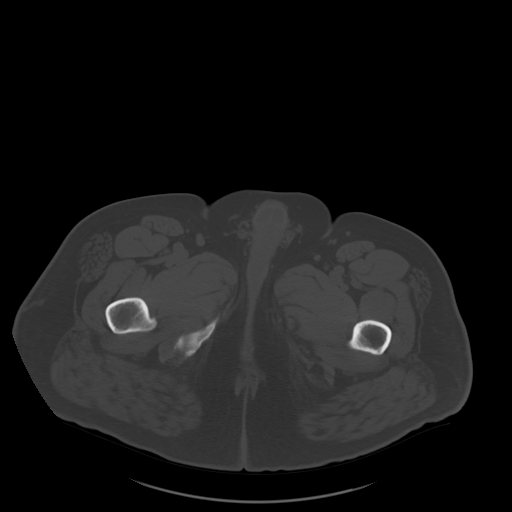
[im 13/106  soft-tissue]
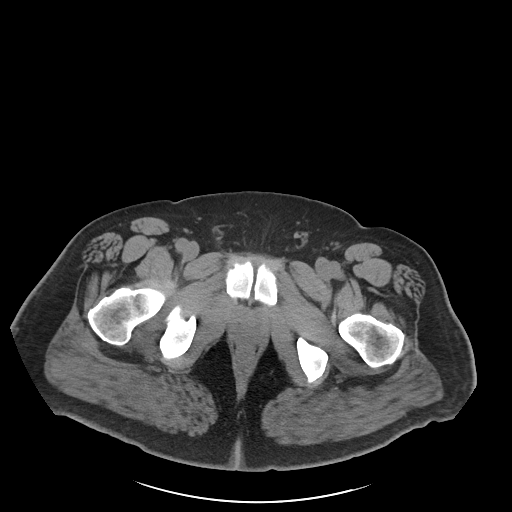
[im 21/106  soft-tissue]
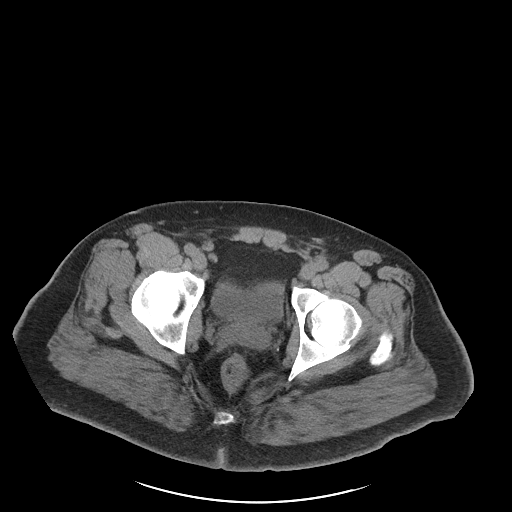
[im 29/106  soft-tissue]
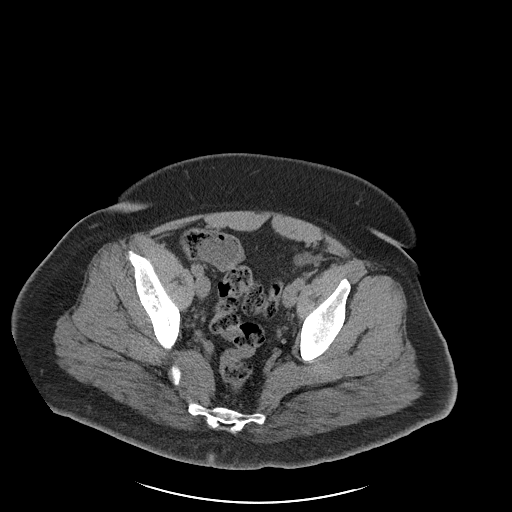
[im 37/106  soft-tissue]
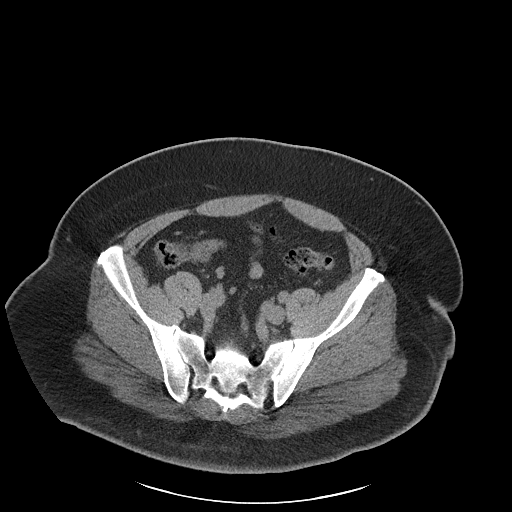
[im 41/106  soft-tissue]
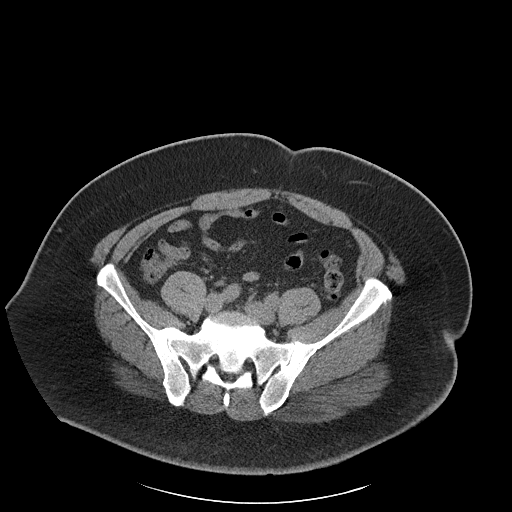
[im 49/106  soft-tissue]
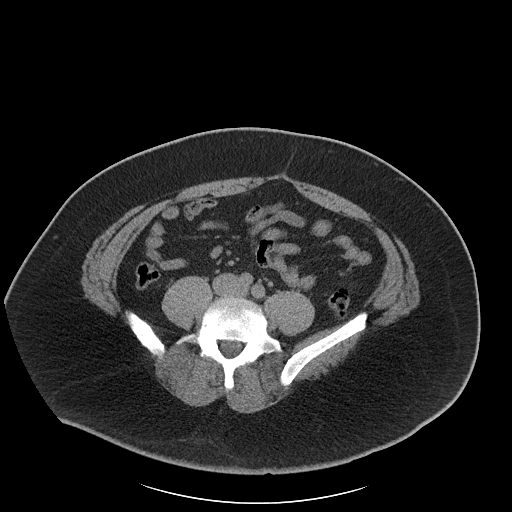
[im 57/106  soft-tissue]
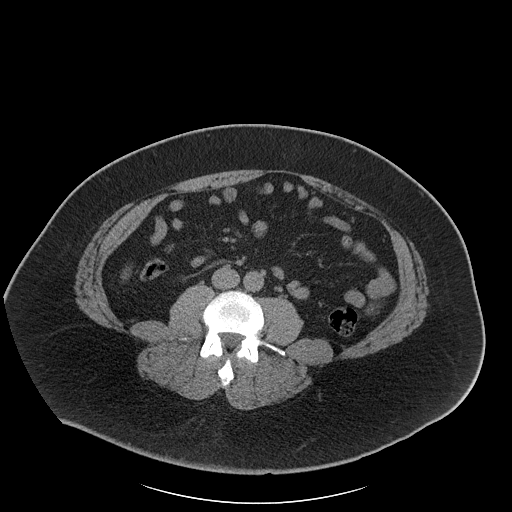
[im 65/106  soft-tissue]
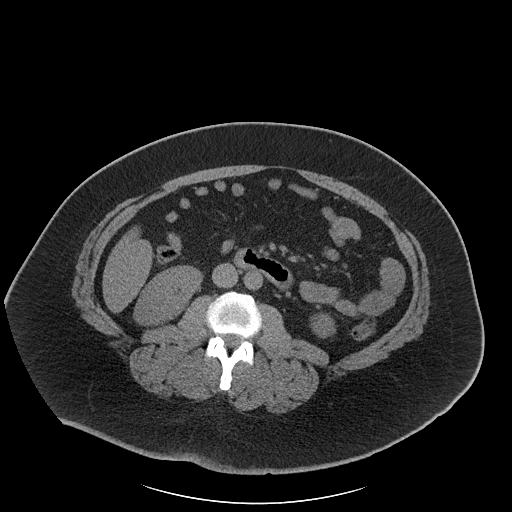
[im 65/106  bone]
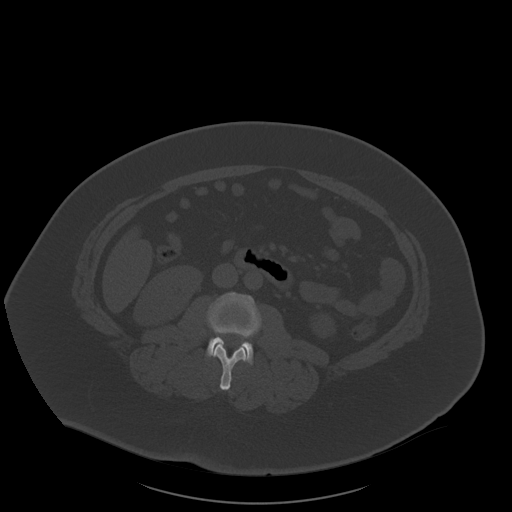
[im 69/106  soft-tissue]
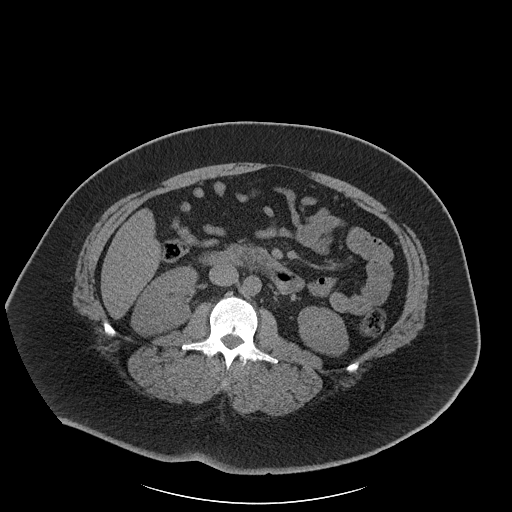
[im 77/106  soft-tissue]
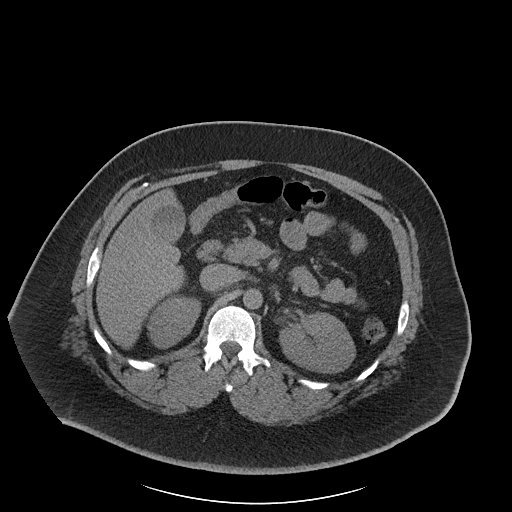
[im 85/106  soft-tissue]
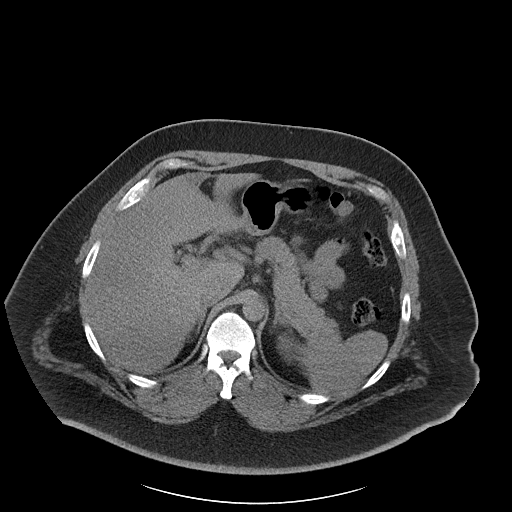
[im 93/106  soft-tissue]
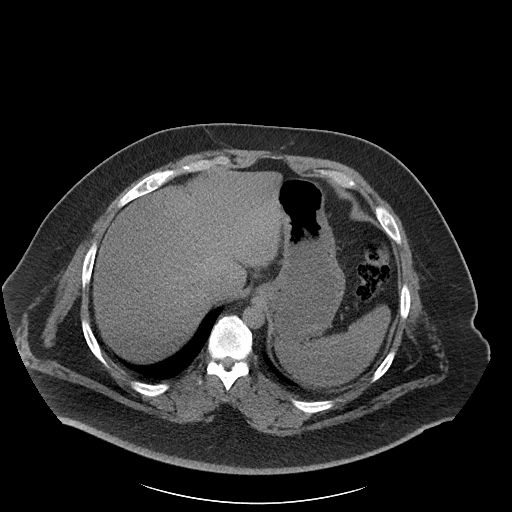
[im 101/106  soft-tissue]
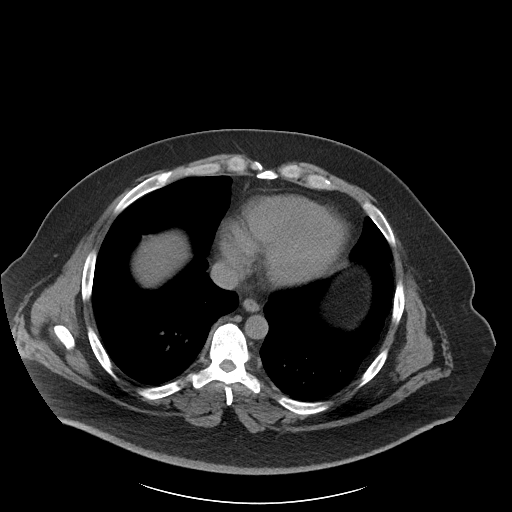

[Series 6: renal stone 3.0 cor · coronal · 0.94mm/px · 3 of 112 slices shown]
[im 38/112  soft-tissue]
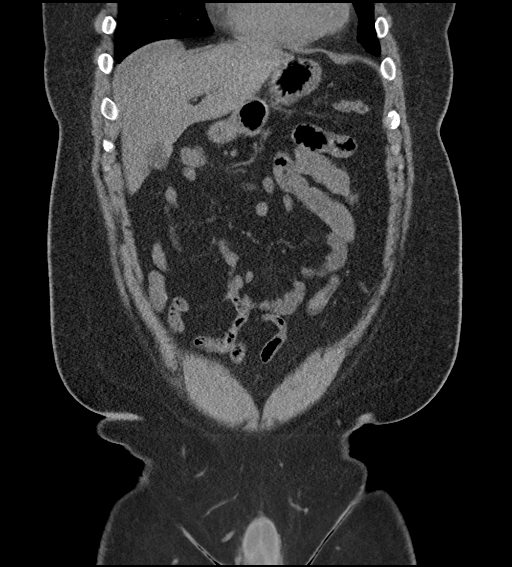
[im 50/112  soft-tissue]
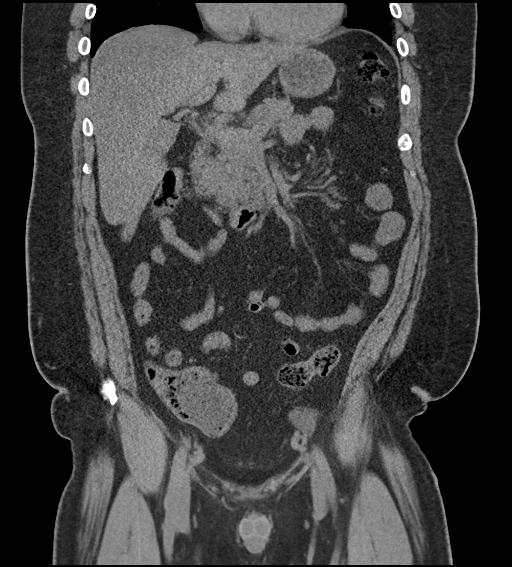
[im 62/112  soft-tissue]
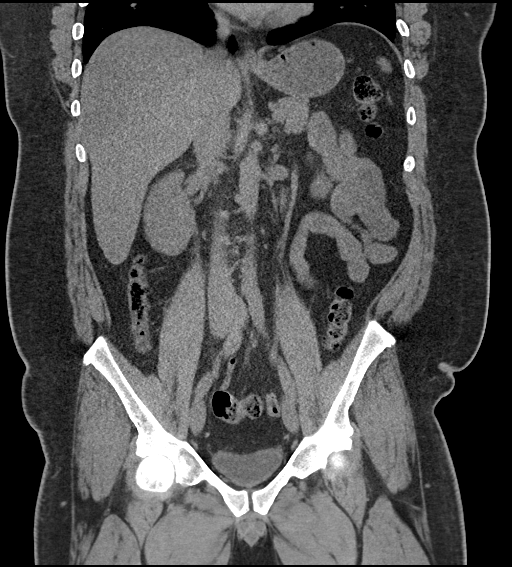

[17 of 46 positions shown; findings below may reference images not displayed]

FINDINGS: Lower chest:  No contributory findings.

Hepatobiliary: No focal liver abnormality.Cholelithiasis without
evidence of cholecystitis.

Pancreas: Unremarkable.

Spleen: Unremarkable.

Adrenals/Urinary Tract: Negative adrenals. Bilateral renal calculi,
numbering at least 9 on the left and 4 on the right-see coronal
reformats. The largest stone is seen at the right lower pole and
measures 4 mm. No hydronephrosis or ureteral stone. A small
calcification is present in the dependent bladder, presumably
recently passed given the history.

Stomach/Bowel:  No obstruction. No appendicitis.

Vascular/Lymphatic: No acute vascular abnormality. No mass or
adenopathy.

Reproductive:No pathologic findings.

Other: No ascites or pneumoperitoneum.

Musculoskeletal: No acute abnormalities.
IMPRESSION: 1. Small stone within the urinary bladder which may have recently
passed given the history. No hydronephrosis or ureteral calculus.
2. Multiple bilateral renal calculi.
3. Cholelithiasis.

## 2018-08-16 MED ORDER — SODIUM CHLORIDE 0.9 % IV BOLUS
500.0000 mL | Freq: Once | INTRAVENOUS | Status: AC
  Administered 2018-08-16: 05:00:00 500 mL via INTRAVENOUS

## 2018-08-16 MED ORDER — MORPHINE SULFATE (PF) 4 MG/ML IV SOLN
4.0000 mg | Freq: Once | INTRAVENOUS | Status: AC
  Administered 2018-08-16: 4 mg via INTRAVENOUS
  Filled 2018-08-16: qty 1

## 2018-08-16 MED ORDER — KETOROLAC TROMETHAMINE 30 MG/ML IJ SOLN
30.0000 mg | Freq: Once | INTRAMUSCULAR | Status: AC
Start: 2018-08-16 — End: 2018-08-16
  Administered 2018-08-16: 05:00:00 30 mg via INTRAVENOUS
  Filled 2018-08-16: qty 1

## 2018-08-16 MED ORDER — HYDROCODONE-ACETAMINOPHEN 5-325 MG PO TABS: 1.0000 | ORAL_TABLET | Freq: Four times a day (QID) | ORAL | 0 refills | Status: DC | PRN

## 2018-08-16 MED ORDER — ONDANSETRON HCL 4 MG/2ML IJ SOLN
4.0000 mg | Freq: Once | INTRAMUSCULAR | Status: AC
  Administered 2018-08-16: 05:00:00 4 mg via INTRAVENOUS
  Filled 2018-08-16: qty 2

## 2018-08-16 NOTE — ED Provider Notes (Signed)
[facility] Provider Note   CSN: [id number] Arrival date & time: 08/16/18  0344    History   Chief Complaint Chief Complaint  Patient presents with  . Flank Pain    HPI Marcus Burns is a 38 y.o. male.     38 year old male presents to the emergency department for evaluation of right flank pain which began at 2 AM.  Pain woke him from sleep and has been constant.  It radiates towards his right groin.  He initially felt as though he had to have a bowel movement.  He was able to successfully defecate, but this did not improve his pain.  He has felt generally weak secondary to persistent discomfort.  Notes some mild nausea without vomiting.  Patient further reports some hematuria.  He feels that his symptoms are similar to when he has had a kidney stone in the past.  No fevers, dysuria.  Abdominal surgical history significant for hernia repair.  He is followed by [facility].  The history is provided by the patient. No language interpreter was used.  Flank Pain This is a new problem. Episode onset: 2AM. The problem occurs constantly. The problem has been gradually worsening. Nothing relieves the symptoms.    Past Medical History:  Diagnosis Date  . Acute prostatitis 06/19/2007  . ALLERGIC RHINITIS 06/19/2007  . Anxiety   . ANXIETY 06/19/2007  . ELEVATED BLOOD PRESSURE WITHOUT DIAGNOSIS OF HYPERTENSION 06/19/2007  . GERD 06/19/2007  . GERD (gastroesophageal reflux disease)   . Inguinal hernia   . Kidney stone   . Migraines   . NEPHROLITHIASIS, HX OF 06/19/2007    Patient Active Problem List   Diagnosis Date Noted  . Back pain 05/14/2017  . Hyperlipidemia 11/19/2014  . Hyperglycemia 11/19/2014  . Scrotal mass 01/14/2012  . Obesity 10/21/2010  . Preventative health care 10/18/2010  . Anxiety state 06/19/2007  . Allergic rhinitis 06/19/2007  . GERD 06/19/2007  . ELEVATED BLOOD PRESSURE WITHOUT DIAGNOSIS OF HYPERTENSION 06/19/2007   . NEPHROLITHIASIS, HX OF 06/19/2007    Past Surgical History:  Procedure Laterality Date  . HERNIA REPAIR          Home Medications    Prior to Admission medications   Medication Sig Start Date End Date Taking? Authorizing Provider  ALPRAZolam Prudy Feeler(XANAX) 0.5 MG tablet Take 1 tablet (0.5 mg total) by mouth at bedtime as needed for sleep or anxiety. Patient not taking: Reported on 08/16/2018 05/13/17   Corwin LevinsJohn, James W, MD  cetirizine (ZYRTEC) 10 MG tablet Take 1 tablet (10 mg total) by mouth daily. Patient not taking: Reported on 08/16/2018 05/13/17 08/16/27  Corwin LevinsJohn, James W, MD  HYDROcodone-acetaminophen (NORCO/VICODIN) 5-325 MG tablet Take 1-2 tablets by mouth every 6 (six) hours as needed for severe pain. 08/16/18   Antony MaduraHumes, Anzlee Hinesley, PA-C  triamcinolone (NASACORT) 55 MCG/ACT AERO nasal inhaler Place 2 sprays into the nose daily. Patient not taking: Reported on 08/16/2018 05/13/17   Corwin LevinsJohn, James W, MD    Family History Family History  Problem Relation Age of Onset  . Coronary artery disease Father   . Anxiety disorder Mother   . Anxiety disorder Brother   . Diabetes Brother     Social History Social History   Tobacco Use  . Smoking status: Former Games developermoker  . Smokeless tobacco: Current User  Substance Use Topics  . Alcohol use: No  . Drug use: No     Allergies   Doxycycline   Review of Systems  Review of Systems  Genitourinary: Positive for flank pain.  Ten systems reviewed and are negative for acute change, except as noted in the HPI.    Physical Exam Updated Vital Signs BP 123/85 (BP Location: Right Arm)   Pulse 70   Temp 98.3 F (36.8 C) (Oral)   Resp 20   Ht 6\' 4"  (1.93 m)   Wt (!) 144.2 kg   SpO2 98%   BMI 38.71 kg/m   Physical Exam Vitals signs and nursing note reviewed.  Constitutional:      General: He is not in acute distress.    Appearance: He is well-developed. He is not diaphoretic.     Comments: Appears uncomfortable, but nontoxic.  HENT:     Head:  Normocephalic and atraumatic.  Eyes:     General: No scleral icterus.    Conjunctiva/sclera: Conjunctivae normal.  Neck:     Musculoskeletal: Normal range of motion.  Cardiovascular:     Rate and Rhythm: Normal rate and regular rhythm.     Pulses: Normal pulses.  Pulmonary:     Effort: Pulmonary effort is normal. No respiratory distress.     Comments: Respirations even and unlabored Abdominal:     Comments: Abdominal exam is fairly benign.  Soft, obese.  No peritoneal signs or palpable masses.  Musculoskeletal: Normal range of motion.  Skin:    General: Skin is warm and dry.     Coloration: Skin is not pale.     Findings: No erythema or rash.  Neurological:     Mental Status: He is alert and oriented to person, place, and time.     Coordination: Coordination normal.  Psychiatric:        Behavior: Behavior normal.      ED Treatments / Results  Labs (all labs ordered are listed, but only abnormal results are displayed) Labs Reviewed  URINALYSIS, ROUTINE W REFLEX MICROSCOPIC - Abnormal; Notable for the following components:      Result Value   Hgb urine dipstick SMALL (*)    Ketones, ur 20 (*)    Bacteria, UA RARE (*)    All other components within normal limits  BASIC METABOLIC PANEL - Abnormal; Notable for the following components:   Glucose, Bld 120 (*)    Creatinine, Ser 1.47 (*)    GFR calc non Af Amer 59 (*)    All other components within normal limits  CBC    EKG None  Radiology Ct Renal Stone Study  Result Date: 08/16/2018 CLINICAL DATA:  Right flank pain with stone disease suspected EXAM: CT ABDOMEN AND PELVIS WITHOUT CONTRAST TECHNIQUE: Multidetector CT imaging of the abdomen and pelvis was performed following the standard protocol without IV contrast. COMPARISON:  03/12/2014 FINDINGS: Lower chest:  No contributory findings. Hepatobiliary: No focal liver abnormality.Cholelithiasis without evidence of cholecystitis. Pancreas: Unremarkable. Spleen:  Unremarkable. Adrenals/Urinary Tract: Negative adrenals. Bilateral renal calculi, numbering at least 9 on the left and 4 on the right-see coronal reformats. The largest stone is seen at the right lower pole and measures 4 mm. No hydronephrosis or ureteral stone. A small calcification is present in the dependent bladder, presumably recently passed given the history. Stomach/Bowel:  No obstruction. No appendicitis. Vascular/Lymphatic: No acute vascular abnormality. No mass or adenopathy. Reproductive:No pathologic findings. Other: No ascites or pneumoperitoneum. Musculoskeletal: No acute abnormalities. IMPRESSION: 1. Small stone within the urinary bladder which may have recently passed given the history. No hydronephrosis or ureteral calculus. 2. Multiple bilateral renal calculi. 3. Cholelithiasis. Electronically  Signed   By: Monte Fantasia M.D.   On: 08/16/2018 05:15    Procedures Procedures (including critical care time)  Medications Ordered in ED Medications  ketorolac (TORADOL) 30 MG/ML injection 30 mg (30 mg Intravenous Given 08/16/18 0454)  morphine 4 MG/ML injection 4 mg (4 mg Intravenous Given 08/16/18 0501)  ondansetron (ZOFRAN) injection 4 mg (4 mg Intravenous Given 08/16/18 0452)  sodium chloride 0.9 % bolus 500 mL (500 mLs Intravenous New Bag/Given 08/16/18 0451)    5:49 AM Pain has resolved following IV medications. Results reviewed with patient and spouse. Patient expresses comfort with discharge.   Initial Impression / Assessment and Plan / ED Course  I have reviewed the triage vital signs and the nursing notes.  Pertinent labs & imaging results that were available during my care of the patient were reviewed by me and considered in my medical decision making (see chart for details).        Patient has been diagnosed with a kidney stone via CT. There is no evidence of significant hydronephrosis, vitals sign stable and the patient does not have irratractable vomiting. He will be  discharged home with pain medications and has been advised to follow up with Urology. Return precautions discussed and provided. Patient discharged in stable condition with no unaddressed concerns.   Final Clinical Impressions(s) / ED Diagnoses   Final diagnoses:  Ureteral colic  Right kidney stone    ED Discharge Orders         Ordered    HYDROcodone-acetaminophen (NORCO/VICODIN) 5-325 MG tablet  Every 6 hours PRN     08/16/18 0547           Antonietta Breach, PA-C 08/16/18 [place], [place], MD 08/16/18 [phone]

## 2018-08-16 NOTE — Discharge Instructions (Signed)
Your work-up today was significant for kidney stone.  There is evidence that this has passed into your bladder.  You should void the stone within 1-2 times of urinating. A stone in your bladder should not cause ongoing pain.  You have been given a short course of pain medication should your symptoms recur.  We recommend follow-up with urology to ensure resolution of symptoms.  You may follow-up with your primary care doctor as needed.  Return for any new or concerning symptoms.

## 2018-08-16 NOTE — ED Triage Notes (Signed)
Patient with right flank pain that radiates to his groin.  Patient states that the pain woke him up suddenly this morning.  He was nauseated but no vomiting.  He has had history of kidney stones.

## 2018-09-15 ENCOUNTER — Encounter: Payer: Self-pay | Admitting: Internal Medicine

## 2018-09-15 ENCOUNTER — Ambulatory Visit (INDEPENDENT_AMBULATORY_CARE_PROVIDER_SITE_OTHER): Payer: No Typology Code available for payment source | Admitting: Internal Medicine

## 2018-09-15 ENCOUNTER — Other Ambulatory Visit: Payer: Self-pay

## 2018-09-15 ENCOUNTER — Other Ambulatory Visit: Payer: Self-pay | Admitting: Internal Medicine

## 2018-09-15 ENCOUNTER — Other Ambulatory Visit (INDEPENDENT_AMBULATORY_CARE_PROVIDER_SITE_OTHER): Payer: No Typology Code available for payment source

## 2018-09-15 ENCOUNTER — Telehealth: Payer: Self-pay

## 2018-09-15 VITALS — BP 126/84 | HR 80 | Temp 98.3°F | Ht 76.0 in | Wt 303.0 lb

## 2018-09-15 DIAGNOSIS — E611 Iron deficiency: Secondary | ICD-10-CM

## 2018-09-15 DIAGNOSIS — E538 Deficiency of other specified B group vitamins: Secondary | ICD-10-CM

## 2018-09-15 DIAGNOSIS — Z0000 Encounter for general adult medical examination without abnormal findings: Secondary | ICD-10-CM

## 2018-09-15 DIAGNOSIS — E559 Vitamin D deficiency, unspecified: Secondary | ICD-10-CM | POA: Diagnosis not present

## 2018-09-15 DIAGNOSIS — Z23 Encounter for immunization: Secondary | ICD-10-CM | POA: Diagnosis not present

## 2018-09-15 DIAGNOSIS — R739 Hyperglycemia, unspecified: Secondary | ICD-10-CM

## 2018-09-15 DIAGNOSIS — N200 Calculus of kidney: Secondary | ICD-10-CM | POA: Insufficient documentation

## 2018-09-15 DIAGNOSIS — Z114 Encounter for screening for human immunodeficiency virus [HIV]: Secondary | ICD-10-CM

## 2018-09-15 DIAGNOSIS — K8020 Calculus of gallbladder without cholecystitis without obstruction: Secondary | ICD-10-CM | POA: Insufficient documentation

## 2018-09-15 LAB — LIPID PANEL
Cholesterol: 172 mg/dL (ref 0–200)
HDL: 29.9 mg/dL — ABNORMAL LOW (ref >39.00)
LDL Cholesterol: 111 mg/dL — ABNORMAL HIGH (ref 0–99)
NonHDL: 141.71
Total CHOL/HDL Ratio: 6
Triglycerides: 152 mg/dL — ABNORMAL HIGH (ref 0.0–149.0)
VLDL: 30.4 mg/dL (ref 0.0–40.0)

## 2018-09-15 LAB — URINALYSIS, ROUTINE W REFLEX MICROSCOPIC
Bilirubin Urine: NEGATIVE
Hgb urine dipstick: NEGATIVE
Ketones, ur: NEGATIVE
Leukocytes,Ua: NEGATIVE
Nitrite: NEGATIVE
RBC / HPF: NONE SEEN (ref 0–?)
Specific Gravity, Urine: 1.025 (ref 1.000–1.030)
Total Protein, Urine: NEGATIVE
Urine Glucose: NEGATIVE
Urobilinogen, UA: 0.2 (ref 0.0–1.0)
pH: 6 (ref 5.0–8.0)

## 2018-09-15 LAB — BASIC METABOLIC PANEL
BUN: 15 mg/dL (ref 6–23)
CO2: 28 mEq/L (ref 19–32)
Calcium: 10 mg/dL (ref 8.4–10.5)
Chloride: 105 mEq/L (ref 96–112)
Creatinine, Ser: 1.07 mg/dL (ref 0.40–1.50)
GFR: 92.93 mL/min (ref >60.00)
Glucose, Bld: 105 mg/dL — ABNORMAL HIGH (ref 70–99)
Potassium: 4.4 mEq/L (ref 3.5–5.1)
Sodium: 141 mEq/L (ref 135–145)

## 2018-09-15 LAB — HEPATIC FUNCTION PANEL
ALT: 17 U/L (ref 0–53)
AST: 12 U/L (ref 0–37)
Albumin: 4.3 g/dL (ref 3.5–5.2)
Alkaline Phosphatase: 76 U/L (ref 39–117)
Bilirubin, Direct: 0.1 mg/dL (ref 0.0–0.3)
Total Bilirubin: 0.8 mg/dL (ref 0.2–1.2)
Total Protein: 8 g/dL (ref 6.0–8.3)

## 2018-09-15 LAB — IBC PANEL
Iron: 44 ug/dL (ref 42–165)
Saturation Ratios: 12 % — ABNORMAL LOW (ref 20.0–50.0)
Transferrin: 261 mg/dL (ref 212.0–360.0)

## 2018-09-15 LAB — TSH: TSH: 1.48 u[IU]/mL (ref 0.35–4.50)

## 2018-09-15 LAB — VITAMIN D 25 HYDROXY (VIT D DEFICIENCY, FRACTURES): VITD: 23.27 ng/mL — ABNORMAL LOW (ref 30.00–100.00)

## 2018-09-15 LAB — HEMOGLOBIN A1C: Hgb A1c MFr Bld: 5.9 % (ref 4.6–6.5)

## 2018-09-15 LAB — VITAMIN B12: Vitamin B-12: 705 pg/mL (ref 211–911)

## 2018-09-15 LAB — MICROALBUMIN / CREATININE URINE RATIO
Creatinine,U: 216.4 mg/dL
Microalb Creat Ratio: 0.4 mg/g (ref 0.0–30.0)
Microalb, Ur: 0.9 mg/dL (ref 0.0–1.9)

## 2018-09-15 MED ORDER — VITAMIN D (ERGOCALCIFEROL) 1.25 MG (50000 UNIT) PO CAPS: 50000.0000 [IU] | ORAL_CAPSULE | ORAL | 0 refills | Status: DC

## 2018-09-15 NOTE — Telephone Encounter (Signed)
Pt has viewed results via MyChart  

## 2018-09-15 NOTE — Patient Instructions (Addendum)

## 2018-09-15 NOTE — Progress Notes (Signed)
Subjective:    Patient ID: Marcus Burns, male    DOB: 11-Dec-1980, 38 y.o.   MRN: [id number]  HPI   Here for wellness and f/u;  Overall doing ok;  Pt denies Chest pain, worsening SOB, DOE, wheezing, orthopnea, PND, worsening LE edema, palpitations, dizziness or syncope.  Pt denies neurological change such as new headache, facial or extremity weakness.  Pt denies polydipsia, polyuria, or low sugar symptoms. Pt states overall good compliance with treatment and medications, good tolerability, and has been trying to follow appropriate diet.  Pt denies worsening depressive symptoms, suicidal ideation or panic. No fever, night sweats, wt loss, loss of appetite, or other constitutional symptoms.  Pt states good ability with ADL's, has low fall risk, home safety reviewed and adequate, no other significant changes in hearing or vision, and Has lost 50 lbs with strict low carb low sweets diet with reduced calories.  Walk 3.5 miles per day every day after work.  Wt Readings from Last 3 Encounters:  09/15/18 (!) 303 lb (137.4 kg)  08/16/18 (!) 318 lb (144.2 kg)  05/13/17 (!) 352 lb 12 oz (160 kg)  has contd stress at work, better after the exercise.  Plans for optho appt in October.   Past Medical History:  Diagnosis Date  . Acute prostatitis 06/19/2007  . ALLERGIC RHINITIS 06/19/2007  . Anxiety   . ANXIETY 06/19/2007  . ELEVATED BLOOD PRESSURE WITHOUT DIAGNOSIS OF HYPERTENSION 06/19/2007  . GERD 06/19/2007  . GERD (gastroesophageal reflux disease)   . Inguinal hernia   . Kidney stone   . Migraines   . NEPHROLITHIASIS, HX OF 06/19/2007   Past Surgical History:  Procedure Laterality Date  . HERNIA REPAIR      reports that he has quit smoking. He uses smokeless tobacco. He reports that he does not drink alcohol or use drugs. family history includes Anxiety disorder in his brother and mother; Coronary artery disease in his father; Diabetes in his brother. Allergies  Allergen Reactions  . Doxycycline  Other (See Comments)    Light headed , passed out   Current Outpatient Medications on File Prior to Visit  Medication Sig Dispense Refill  . ALPRAZolam (XANAX) 0.5 MG tablet Take 1 tablet (0.5 mg total) by mouth at bedtime as needed for sleep or anxiety. 30 tablet 5  . cetirizine (ZYRTEC) 10 MG tablet Take 1 tablet (10 mg total) by mouth daily. 30 tablet 11  . HYDROcodone-acetaminophen (NORCO/VICODIN) 5-325 MG tablet Take 1-2 tablets by mouth every 6 (six) hours as needed for severe pain. 10 tablet 0  . triamcinolone (NASACORT) 55 MCG/ACT AERO nasal inhaler Place 2 sprays into the nose daily. 1 Inhaler 12   No current facility-administered medications on file prior to visit.    Review of Systems Constitutional: Negative for other unusual diaphoresis, sweats, appetite or weight changes HENT: Negative for other worsening hearing loss, ear pain, facial swelling, mouth sores or neck stiffness.   Eyes: Negative for other worsening pain, redness or other visual disturbance.  Respiratory: Negative for other stridor or swelling Cardiovascular: Negative for other palpitations or other chest pain  Gastrointestinal: Negative for worsening diarrhea or loose stools, blood in stool, distention or other pain Genitourinary: Negative for hematuria, flank pain or other change in urine volume.  Musculoskeletal: Negative for myalgias or other joint swelling.  Skin: Negative for other color change, or other wound or worsening drainage.  Neurological: Negative for other syncope or numbness. Hematological: Negative for other adenopathy or swelling  Psychiatric/Behavioral: Negative for hallucinations, other worsening agitation, SI, self-injury, or new decreased concentration All other system neg per pt    Objective:   Physical Exam BP 126/84   Pulse 80   Temp 98.3 F (36.8 C) (Oral)   Ht 6\' 4"  (1.93 m)   Wt (!) 303 lb (137.4 kg)   SpO2 97%   BMI 36.88 kg/m  VS noted,  Constitutional: Pt is oriented to  person, place, and time. Appears well-developed and well-nourished, in no significant distress and comfortable Head: Normocephalic and atraumatic  Eyes: Conjunctivae and EOM are normal. Pupils are equal, round, and reactive to light Right Ear: External ear normal without discharge Left Ear: External ear normal without discharge Nose: Nose without discharge or deformity Mouth/Throat: Oropharynx is without other ulcerations and moist  Neck: Normal range of motion. Neck supple. No JVD present. No tracheal deviation present or significant neck LA or mass Cardiovascular: Normal rate, regular rhythm, normal heart sounds and intact distal pulses.   Pulmonary/Chest: WOB normal and breath sounds without rales or wheezing  Abdominal: Soft. Bowel sounds are normal. NT. No HSM  Musculoskeletal: Normal range of motion. Exhibits no edema Lymphadenopathy: Has no other cervical adenopathy.  Neurological: Pt is alert and oriented to person, place, and time. Pt has normal reflexes. No cranial nerve deficit. Motor grossly intact, Gait intact Skin: Skin is warm and dry. No rash noted or new ulcerations Psychiatric:  Has normal mood and affect. Behavior is normal without agitation No other exam findings  Lab Results  Component Value Date   WBC 5.5 08/16/2018   HGB 14.0 08/16/2018   HCT 43.2 08/16/2018   PLT 258 08/16/2018   GLUCOSE 120 (H) 08/16/2018   CHOL 190 05/13/2017   TRIG 388.0 (H) 05/13/2017   HDL 32.30 (L) 05/13/2017   LDLDIRECT 88.0 05/13/2017   ALT 26 05/13/2017   AST 16 05/13/2017   NA 139 08/16/2018   K 3.8 08/16/2018   CL 101 08/16/2018   CREATININE 1.47 (H) 08/16/2018   BUN 16 08/16/2018   CO2 24 08/16/2018   TSH 1.88 05/13/2017   HGBA1C 6.3 05/13/2017       Assessment & Plan:

## 2018-09-15 NOTE — Telephone Encounter (Signed)
-----   Message from Biagio Borg, MD sent at 09/15/2018 12:22 PM EDT ----- Left message on MyChart, pt to cont same tx except  The test results show that your current treatment is OK, as the tests are stable, except for a low Vitamin D level.  Please take Vitamin D 50000 units weekly for 12 weeks, then plan to change to OTC Vitamin D3 at 2000 units per day, indefinitely.Redmond Baseman to please inform pt, I will do rx

## 2018-09-15 NOTE — Assessment & Plan Note (Signed)

## 2018-09-15 NOTE — Assessment & Plan Note (Signed)
stable overall by history and exam, recent data reviewed with pt, and pt to continue medical treatment as before,  to f/u any worsening symptoms or concerns  

## 2018-09-16 LAB — HIV ANTIBODY (ROUTINE TESTING W REFLEX): HIV 1&2 Ab, 4th Generation: NONREACTIVE

## 2018-09-23 ENCOUNTER — Other Ambulatory Visit: Payer: Self-pay | Admitting: Internal Medicine

## 2018-09-25 ENCOUNTER — Encounter: Payer: Self-pay | Admitting: Internal Medicine

## 2018-09-26 ENCOUNTER — Encounter: Payer: Self-pay | Admitting: Internal Medicine

## 2018-09-26 MED ORDER — ALPRAZOLAM 0.5 MG PO TABS: 0.5000 mg | ORAL_TABLET | Freq: Every evening | ORAL | 2 refills | Status: DC | PRN

## 2018-11-29 ENCOUNTER — Other Ambulatory Visit: Payer: Self-pay

## 2018-11-29 ENCOUNTER — Ambulatory Visit (INDEPENDENT_AMBULATORY_CARE_PROVIDER_SITE_OTHER): Payer: No Typology Code available for payment source

## 2018-11-29 DIAGNOSIS — Z23 Encounter for immunization: Secondary | ICD-10-CM

## 2019-01-24 ENCOUNTER — Telehealth: Payer: No Typology Code available for payment source | Admitting: Family

## 2019-01-24 DIAGNOSIS — Z20822 Contact with and (suspected) exposure to covid-19: Secondary | ICD-10-CM

## 2019-01-24 DIAGNOSIS — Z8616 Personal history of COVID-19: Secondary | ICD-10-CM

## 2019-01-24 HISTORY — DX: Personal history of COVID-19: Z86.16

## 2019-01-24 NOTE — Progress Notes (Signed)
E-Visit for Corona Virus Screening  Your current symptoms could be consistent with the coronavirus.  Many health care providers can now test patients at their office but not all are.  [place] has multiple testing sites. For information on our COVID testing locations and hours go to [email or web address]  We are enrolling you in our MyChart Home Monitoring for COVID19 . Daily you will receive a questionnaire within the MyChart website. Our COVID 19 response team will be monitoring your responses daily.  Testing Information: The COVID-19 Community Testing sites will begin testing BY APPOINTMENT ONLY.  You can schedule online at [email or web address]  If you do not have access to a smart phone or computer you may call [phone] for an appointment.   Additional testing sites in the Community:  . For [facility] Testing sites in [place]  https://www.cvs.com/minuteclinic/covid-19-testing  . For Pop-up testing sites in [place]  https://covid19.ncdhhs.gov/about-covid-19/testing/find-my-testing-place/pop-testing-sites  . For Testing sites with regular hours https://onsms.org/[place]/  . For [place] https://[facility].com/covid-19-community-outreach-testing/  . For [facility] https://[place].gov[place]/human-services/health-department/coronavirus-covid-19-info/covid-19-testing  . For [place] testing in [place] and [place] https://www.[place].gov[place]/human-services/health-department/coronavirus-covid-19-info/covid-19-testing  . For [facility] testing in [place]   https://lhi.care/covidtesting  For  more information about community testing call [phone]   Please quarantine yourself while awaiting your test results. Please stay home for a minimum of 10 days from the first day of illness with improving symptoms and you have had 24 hours of no fever (without the use of Tylenol (Acetaminophen)  Motrin (Ibuprofen) or any fever reducing medication).  Also - Do not get tested prior to returning to work because once you have had a positive test the test can stay positive for more then a month in some cases.   You should wear a mask or cloth face covering over your nose and mouth if you must be around other people or animals, including pets (even at home). Try to stay at least 6 feet away from other people. This will protect the people around you.  Please continue good preventive care measures, including:  frequent hand-washing, avoid touching your face, cover coughs/sneezes, stay out of crowds and keep a 6 foot distance from others.  COVID-19 is a respiratory illness with symptoms that are similar to the flu. Symptoms are typically mild to moderate, but there have been cases of severe illness and death due to the virus.   The following symptoms may appear 2-14 days after exposure: . Fever . Cough . Shortness of breath or difficulty breathing . Chills . Repeated shaking with chills . Muscle pain . Headache . Sore throat . New loss of taste or smell . Fatigue . Congestion or runny nose . Nausea or vomiting . Diarrhea  Go to the nearest hospital ED for assessment if fever/cough/breathlessness are severe or illness seems like a threat to life.  It is vitally important that if you feel that you have an infection such as this virus or any other virus that you stay home and away from places where you may spread it to others.  You should avoid contact with people age 65 and older.   You can use medication such as A prescription cough medication called Tessalon Perles 100 mg. You may take 1-2 capsules every 8 hours as needed for cough.  You may also take acetaminophen (Tylenol) as needed for fever.  Reduce your risk of any infection by using the same precautions used for avoiding the common cold or flu:  . Wash your hands   often with soap and warm water for at least 20 seconds.  If soap and  water are not readily available, use an alcohol-based hand sanitizer with at least 60% alcohol.  . If coughing or sneezing, cover your mouth and nose by coughing or sneezing into the elbow areas of your shirt or coat, into a tissue or into your sleeve (not your hands). . Avoid shaking hands with others and consider head nods or verbal greetings only. . Avoid touching your eyes, nose, or mouth with unwashed hands.  . Avoid close contact with people who are sick. . Avoid places or events with large numbers of people in one location, like concerts or sporting events. . Carefully consider travel plans you have or are making. . If you are planning any travel outside or inside the [place], visit the CDC's Travelers' Health webpage for the latest health notices. . If you have some symptoms but not all symptoms, continue to monitor at home and seek medical attention if your symptoms worsen. . If you are having a medical emergency, call 911.  HOME CARE . Only take medications as instructed by your medical team. . Drink plenty of fluids and get plenty of rest. . A steam or ultrasonic humidifier can help if you have congestion.   GET HELP RIGHT AWAY IF YOU HAVE EMERGENCY WARNING SIGNS** FOR COVID-19. If you or someone is showing any of these signs seek emergency medical care immediately. Call 911 or proceed to your closest emergency facility if: . You develop worsening high fever. . Trouble breathing . Bluish lips or face . Persistent pain or pressure in the chest . New confusion . Inability to wake or stay awake . You cough up blood. . Your symptoms become more severe  **This list is not all possible symptoms. Contact your medical provider for any symptoms that are sever or concerning to you.  MAKE SURE YOU   Understand these instructions.  Will watch your condition.  Will get help right away if you are not doing well or get worse.  Your e-visit answers were reviewed by a board certified  advanced clinical practitioner to complete your personal care plan.  Depending on the condition, your plan could have included both over the counter or prescription medications.  If there is a problem please reply once you have received a response from your provider.  Your safety is important to us.  If you have drug allergies check your prescription carefully.    You can use MyChart to ask questions about today's visit, request a non-urgent call back, or ask for a work or school excuse for 24 hours related to this e-Visit. If it has been greater than 24 hours you will need to follow up with your provider, or enter a new e-Visit to address those concerns. You will get an e-mail in the next two days asking about your experience.  I hope that your e-visit has been valuable and will speed your recovery. Thank you for using e-visits.  Approximately 5 minutes was spent documenting and reviewing patient's chart.    

## 2019-01-26 ENCOUNTER — Encounter (INDEPENDENT_AMBULATORY_CARE_PROVIDER_SITE_OTHER): Payer: Self-pay

## 2019-01-28 ENCOUNTER — Encounter: Payer: Self-pay | Admitting: Internal Medicine

## 2019-01-28 ENCOUNTER — Encounter (INDEPENDENT_AMBULATORY_CARE_PROVIDER_SITE_OTHER): Payer: Self-pay

## 2019-01-29 ENCOUNTER — Encounter (INDEPENDENT_AMBULATORY_CARE_PROVIDER_SITE_OTHER): Payer: Self-pay

## 2019-02-01 ENCOUNTER — Emergency Department (HOSPITAL_COMMUNITY): Payer: No Typology Code available for payment source

## 2019-02-01 ENCOUNTER — Other Ambulatory Visit: Payer: Self-pay

## 2019-02-01 ENCOUNTER — Emergency Department (HOSPITAL_COMMUNITY)
Admission: EM | Admit: 2019-02-01 | Discharge: 2019-02-01 | Disposition: A | Discharge status: Home / Self Care | Payer: No Typology Code available for payment source | Attending: Emergency Medicine | Admitting: Emergency Medicine

## 2019-02-01 DIAGNOSIS — Z87891 Personal history of nicotine dependence: Secondary | ICD-10-CM | POA: Insufficient documentation

## 2019-02-01 DIAGNOSIS — R109 Unspecified abdominal pain: Secondary | ICD-10-CM | POA: Diagnosis present

## 2019-02-01 DIAGNOSIS — N132 Hydronephrosis with renal and ureteral calculous obstruction: Secondary | ICD-10-CM | POA: Diagnosis not present

## 2019-02-01 LAB — CBC WITH DIFFERENTIAL/PLATELET
Abs Immature Granulocytes: 0.03 10*3/uL (ref 0.00–0.07)
Basophils Absolute: 0 10*3/uL (ref 0.0–0.1)
Basophils Relative: 1 %
Eosinophils Absolute: 0.2 10*3/uL (ref 0.0–0.5)
Eosinophils Relative: 5 %
HCT: 43.7 % (ref 39.0–52.0)
Hemoglobin: 14.2 g/dL (ref 13.0–17.0)
Immature Granulocytes: 1 %
Lymphocytes Relative: 30 %
Lymphs Abs: 1.3 10*3/uL (ref 0.7–4.0)
MCH: 28.6 pg (ref 26.0–34.0)
MCHC: 32.5 g/dL (ref 30.0–36.0)
MCV: 88.1 fL (ref 80.0–100.0)
Monocytes Absolute: 0.4 10*3/uL (ref 0.1–1.0)
Monocytes Relative: 8 %
Neutro Abs: 2.5 10*3/uL (ref 1.7–7.7)
Neutrophils Relative %: 55 %
Platelets: 226 10*3/uL (ref 150–400)
RBC: 4.96 MIL/uL (ref 4.22–5.81)
RDW: 13.8 % (ref 11.5–15.5)
WBC: 4.4 10*3/uL (ref 4.0–10.5)
nRBC: 0 % (ref 0.0–0.2)

## 2019-02-01 LAB — URINALYSIS, ROUTINE W REFLEX MICROSCOPIC
Bacteria, UA: NONE SEEN
Bilirubin Urine: NEGATIVE
Glucose, UA: NEGATIVE mg/dL
Ketones, ur: NEGATIVE mg/dL
Leukocytes,Ua: NEGATIVE
Nitrite: NEGATIVE
Protein, ur: 30 mg/dL — AB
RBC / HPF: >50 RBC/hpf — ABNORMAL HIGH (ref 0–5)
Specific Gravity, Urine: 1.021 (ref 1.005–1.030)
pH: 6 (ref 5.0–8.0)

## 2019-02-01 LAB — COMPREHENSIVE METABOLIC PANEL
ALT: 13 U/L (ref 0–44)
AST: 26 U/L (ref 15–41)
Albumin: 3.6 g/dL (ref 3.5–5.0)
Alkaline Phosphatase: 67 U/L (ref 38–126)
Anion gap: 8 (ref 5–15)
BUN: 17 mg/dL (ref 6–20)
CO2: 23 mmol/L (ref 22–32)
Calcium: 8.3 mg/dL — ABNORMAL LOW (ref 8.9–10.3)
Chloride: 113 mmol/L — ABNORMAL HIGH (ref 98–111)
Creatinine, Ser: 1.64 mg/dL — ABNORMAL HIGH (ref 0.61–1.24)
GFR calc Af Amer: >60 mL/min (ref >60)
GFR calc non Af Amer: 52 mL/min — ABNORMAL LOW (ref >60)
Glucose, Bld: 97 mg/dL (ref 70–99)
Potassium: 5.1 mmol/L (ref 3.5–5.1)
Sodium: 144 mmol/L (ref 135–145)
Total Bilirubin: 1.5 mg/dL — ABNORMAL HIGH (ref 0.3–1.2)
Total Protein: 7 g/dL (ref 6.5–8.1)

## 2019-02-01 LAB — LIPASE, BLOOD: Lipase: 22 U/L (ref 11–51)

## 2019-02-01 IMAGING — CT CT RENAL STONE PROTOCOL
2 of 4 series · 16 of 46 positions shown, 18 images · non-contrast
Comparison: CT abdomen/pelvis [DATE].

CLINICAL DATA: Flank pain, history of ureteral stone; flank pain,
kidney stone suspected.

EXAM:
CT ABDOMEN AND PELVIS WITHOUT CONTRAST
TECHNIQUE: Multidetector CT imaging of the abdomen and pelvis was performed
following the standard protocol without IV contrast.

[Series 2: axial st · axial · 0.85mm/px · z∈[-511,-51]mm · 13 of 104 slices shown, 15 images]
[im 6/104  soft-tissue]
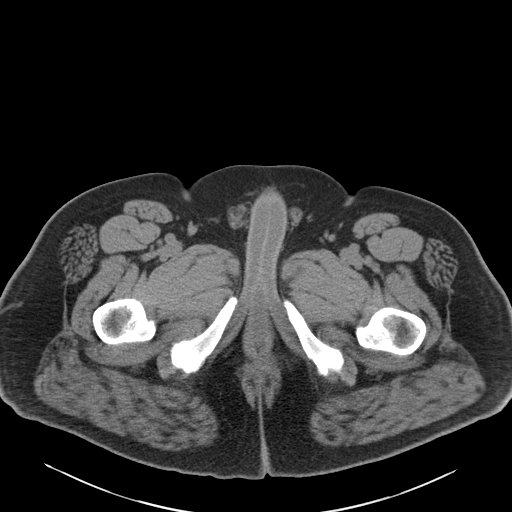
[im 6/104  bone]
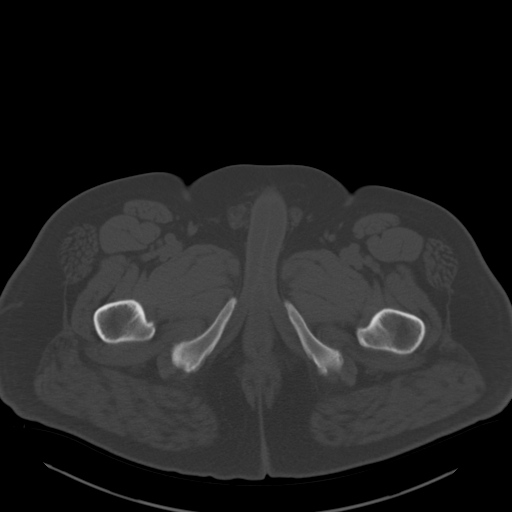
[im 12/104  soft-tissue]
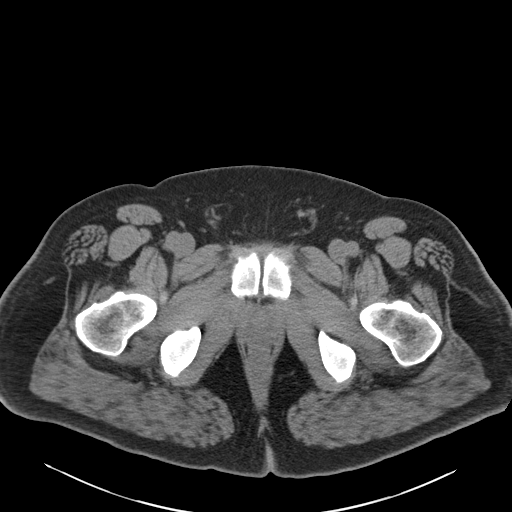
[im 23/104  soft-tissue]
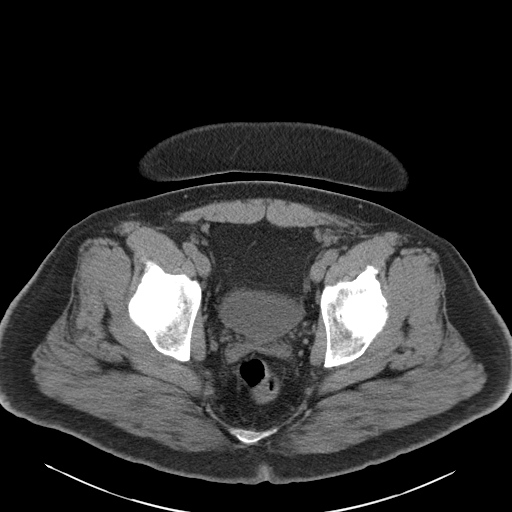
[im 29/104  soft-tissue]
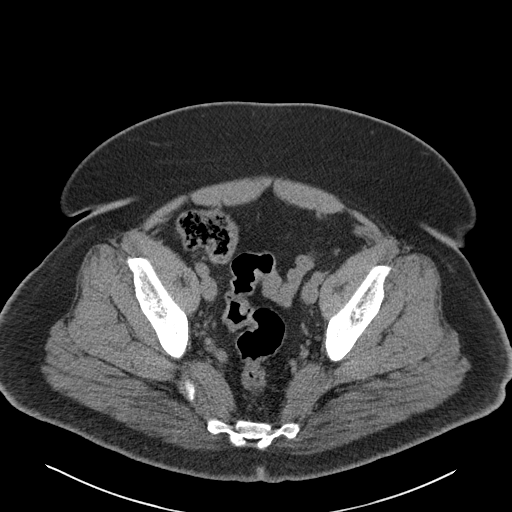
[im 35/104  soft-tissue]
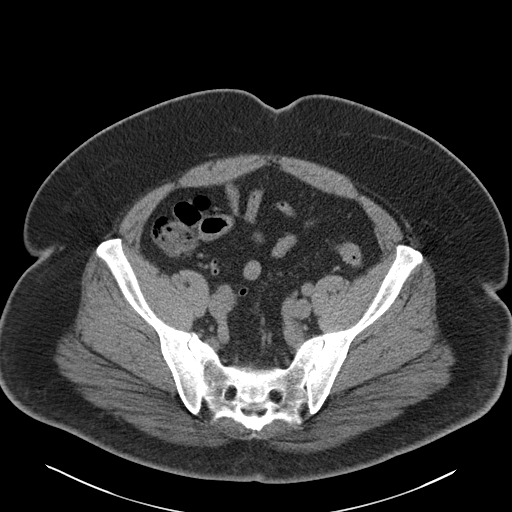
[im 46/104  soft-tissue]
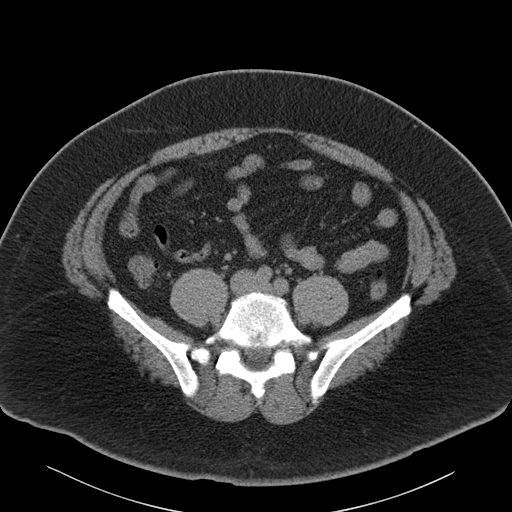
[im 52/104  soft-tissue]
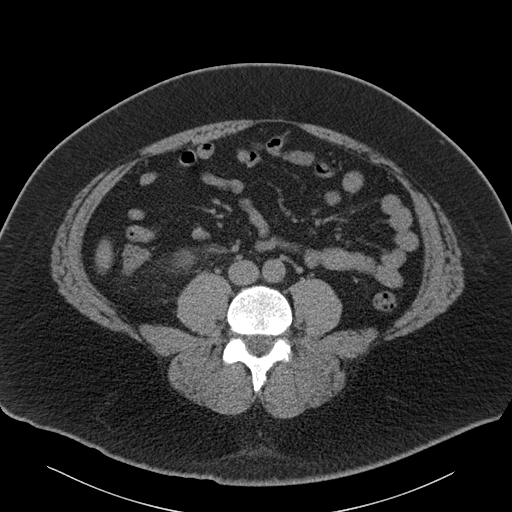
[im 58/104  soft-tissue]
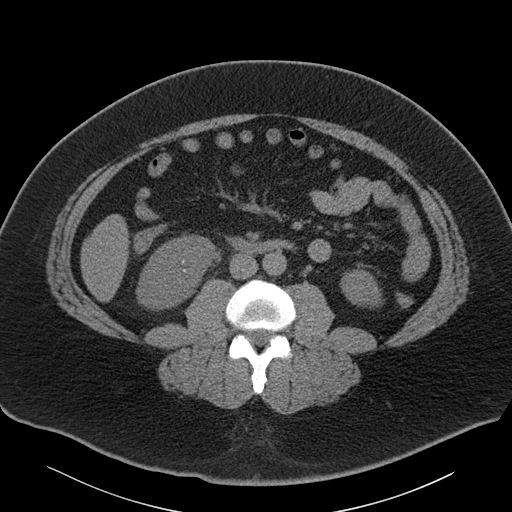
[im 69/104  soft-tissue]
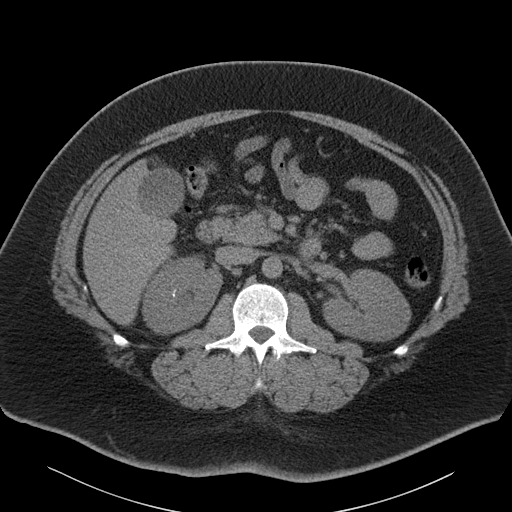
[im 69/104  bone]
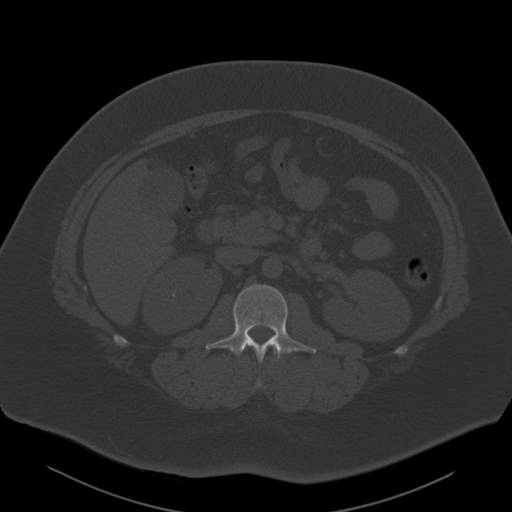
[im 75/104  soft-tissue]
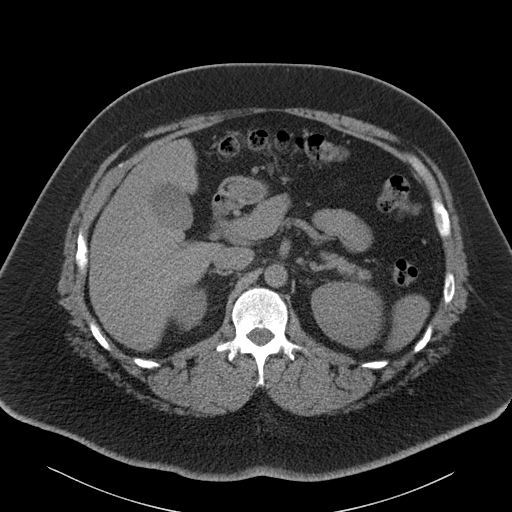
[im 81/104  soft-tissue]
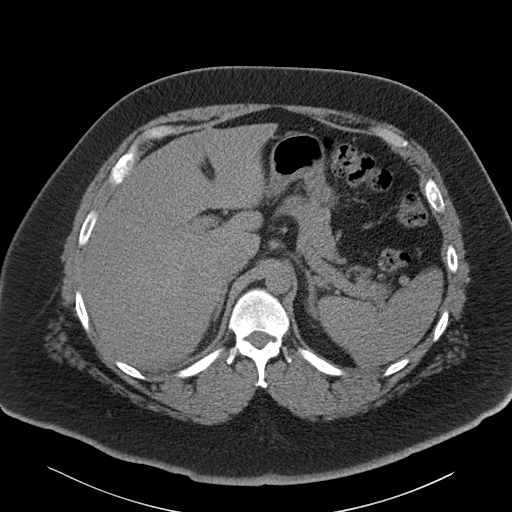
[im 92/104  soft-tissue]
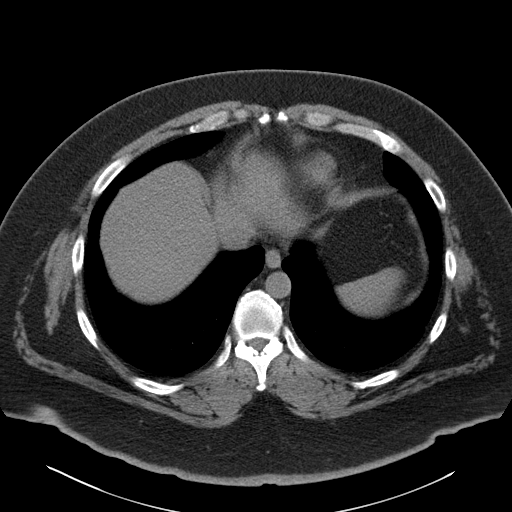
[im 98/104  soft-tissue]
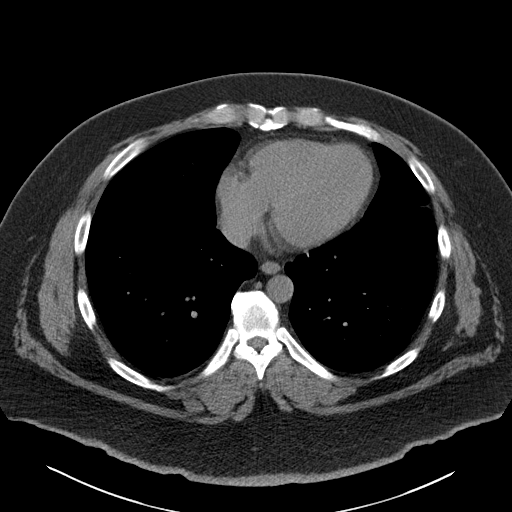

[Series 5: coronal · coronal · 0.87mm/px · 3 of 200 slices shown]
[im 67/200  soft-tissue]
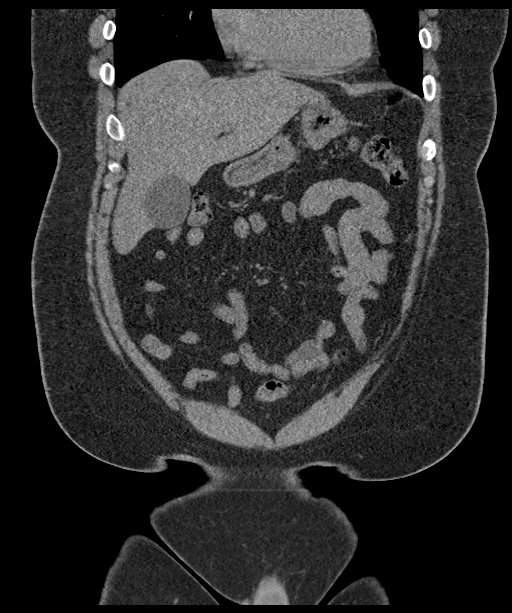
[im 89/200  soft-tissue]
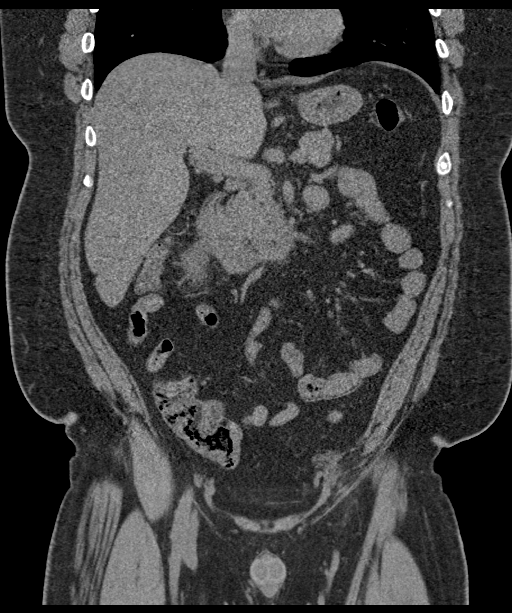
[im 111/200  soft-tissue]
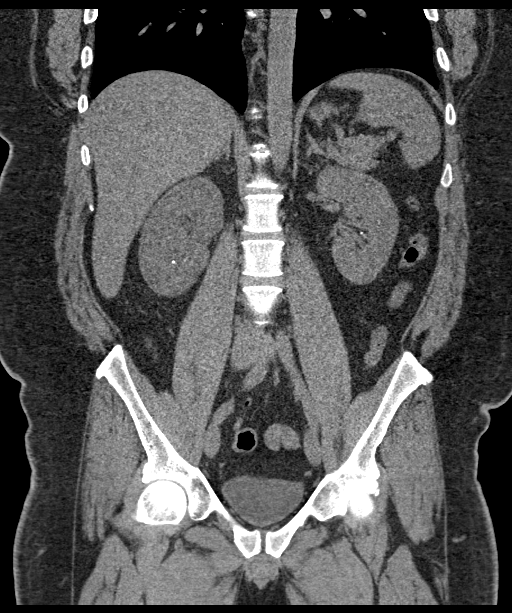

[16 of 46 positions shown; findings below may reference images not displayed]

FINDINGS: Lower chest: There are scattered ground-glass opacities within the
partially imaged right middle lobe and bilateral lower lobes, likely
infectious/inflammatory.

Hepatobiliary: The liver is normal in size and contour.
Cholecystolithiasis. The common duct is normal in caliber.

Pancreas: Unremarkable

Spleen: Incidentally noted small left upper quadrant splenule.
Otherwise unremarkable.

Adrenals/Urinary Tract: No adrenal gland mass. A 4 mm calculus is
present within the proximal right ureter (series 2, image 51)
(series 5, image 103). There is mild hydronephrosis and hydroureter
proximal to this point. There is right perinephric fat stranding as
well as mild stranding surrounding the proximal right ureter. There
are numerous small bilateral renal collecting system calculi. The
bladder is underdistended but otherwise unremarkable.

STOMACH/BOWEL: The stomach is unremarkable.No dilated loops of bowel
or appreciable bowel wall thickening.Normal appendix.

VASCULAR/LYMPHATIC:

No abdominal aortic aneurysm.

No lymphadenopathy.

REPRODUCTIVE: Incompletely assessed by CT modality.No pathologic
findings.

OTHER: No ascites.The body wall is normal.

MUSCULOSKELETAL: No acute bony abnormality. Redemonstrated fusion
across the ventral aspect of the sacroiliac joints bilaterally.
IMPRESSION: 4 mm calculus within the proximal right ureter. Mild hydronephrosis
and hydroureter proximal to this point. Right perinephric and
proximal periureteral fat stranding. Correlate for pyelonephritis.

Numerous additional small bilateral renal collecting system calculi.

Patchy ground-glass opacities within the visualized right middle and
bilateral lower lobes, likely infectious/inflammatory.

Cholecystolithiasis.

Redemonstrated bilateral sacroiliac fusion.

## 2019-02-01 MED ORDER — TAMSULOSIN HCL 0.4 MG PO CAPS: 0.4000 mg | ORAL_CAPSULE | Freq: Every day | ORAL | 0 refills | Status: DC

## 2019-02-01 MED ORDER — HYDROCODONE-ACETAMINOPHEN 5-325 MG PO TABS: 1.0000 | ORAL_TABLET | Freq: Four times a day (QID) | ORAL | 0 refills | Status: DC | PRN

## 2019-02-01 MED ORDER — HYDROMORPHONE HCL 1 MG/ML IJ SOLN
1.0000 mg | Freq: Once | INTRAMUSCULAR | Status: AC
  Administered 2019-02-01: 1 mg via INTRAVENOUS
  Filled 2019-02-01: qty 1

## 2019-02-01 MED ORDER — KETOROLAC TROMETHAMINE 30 MG/ML IJ SOLN
30.0000 mg | Freq: Once | INTRAMUSCULAR | Status: AC
  Administered 2019-02-01: 30 mg via INTRAVENOUS
  Filled 2019-02-01: qty 1

## 2019-02-01 MED ORDER — ONDANSETRON 8 MG PO TBDP: 8.0000 mg | ORAL_TABLET | Freq: Three times a day (TID) | ORAL | 0 refills | Status: DC | PRN

## 2019-02-01 NOTE — ED Triage Notes (Signed)
Right flank pain that radiates into right groin. Hx of kidney stones. +nausea. Tested positive for COVID 4 days ago.

## 2019-02-01 NOTE — ED Provider Notes (Signed)
[facility]-EMERGENCY DEPT Provider Note   CSN: [id number] Arrival date & time: 02/01/19  0737     History Flank pain  Marcus Burns is a 39 y.o. male.  HPI   Pt presents to the ED with complaints of right flank pain.  Pt has a history of kidney stones.  He has been having pain in his right flank that moves around to the right.  It is a sharp pain.  No fever.  No vomiting.  No cough or shortness of breath.  Pt is worried he is having issues with his appendix or possibly another kidney stone.  Patient was also diagnosed with Covid recently but he is not having any issues with cough or shortness of breath or fevers.  Past Medical History:  Diagnosis Date  . Acute prostatitis 06/19/2007  . ALLERGIC RHINITIS 06/19/2007  . Anxiety   . ANXIETY 06/19/2007  . ELEVATED BLOOD PRESSURE WITHOUT DIAGNOSIS OF HYPERTENSION 06/19/2007  . GERD 06/19/2007  . GERD (gastroesophageal reflux disease)   . Inguinal hernia   . Kidney stone   . Migraines   . NEPHROLITHIASIS, HX OF 06/19/2007    Patient Active Problem List   Diagnosis Date Noted  . Gallstones 09/15/2018  . Renal stones 09/15/2018  . Back pain 05/14/2017  . Hyperlipidemia 11/19/2014  . Hyperglycemia 11/19/2014  . Scrotal mass 01/14/2012  . Obesity 10/21/2010  . Preventative health care 10/18/2010  . Anxiety state 06/19/2007  . Allergic rhinitis 06/19/2007  . GERD 06/19/2007  . ELEVATED BLOOD PRESSURE WITHOUT DIAGNOSIS OF HYPERTENSION 06/19/2007  . NEPHROLITHIASIS, HX OF 06/19/2007    Past Surgical History:  Procedure Laterality Date  . HERNIA REPAIR         Family History  Problem Relation Age of Onset  . Coronary artery disease Father   . Anxiety disorder Mother   . Anxiety disorder Brother   . Diabetes Brother     Social History   Tobacco Use  . Smoking status: Former Games developer  . Smokeless tobacco: Current User  Substance Use Topics  . Alcohol use: No  . Drug use: No    Home  Medications Prior to Admission medications   Medication Sig Start Date End Date Taking? Authorizing Provider  acetaminophen (TYLENOL) 500 MG tablet Take 1,000 mg by mouth every 6 (six) hours as needed for mild pain or moderate pain.   Yes [provider]  guaiFENesin (MUCINEX) 600 MG 12 hr tablet Take 600 mg by mouth 2 (two) times daily as needed for to loosen phlegm.   Yes [provider]  ibuprofen (ADVIL) 200 MG tablet Take 600 mg by mouth every 6 (six) hours as needed for mild pain or moderate pain.   Yes [provider]  HYDROcodone-acetaminophen (NORCO/VICODIN) 5-325 MG tablet Take 1 tablet by mouth every 6 (six) hours as needed for moderate pain or severe pain. 02/01/19   Linwood Dibbles, MD  ondansetron (ZOFRAN ODT) 8 MG disintegrating tablet Take 1 tablet (8 mg total) by mouth every 8 (eight) hours as needed for nausea or vomiting. 02/01/19   Linwood Dibbles, MD  tamsulosin (FLOMAX) 0.4 MG CAPS capsule Take 1 capsule (0.4 mg total) by mouth daily. 02/01/19   Linwood Dibbles, MD  cetirizine (ZYRTEC) 10 MG tablet Take 1 tablet (10 mg total) by mouth daily. Patient not taking: Reported on 02/01/2019 05/13/17 02/01/19  Corwin Levins, MD  triamcinolone (NASACORT) 55 MCG/ACT AERO nasal inhaler Place 2 sprays into the nose daily. Patient  not taking: Reported on 02/01/2019 05/13/17 02/01/19  Corwin Levins, MD    Allergies    Doxycycline  Review of Systems   Review of Systems  All other systems reviewed and are negative.   Physical Exam Updated Vital Signs BP (!) 143/89   Pulse 94   Temp 98.5 F (36.9 C) (Oral)   Resp 16   SpO2 100%   Physical Exam Vitals and nursing note reviewed.  Constitutional:      General: He is not in acute distress.    Appearance: He is well-developed. He is obese.  HENT:     Head: Normocephalic and atraumatic.     Right Ear: External ear normal.     Left Ear: External ear normal.  Eyes:     General: No scleral icterus.       Right eye: No  discharge.        Left eye: No discharge.     Conjunctiva/sclera: Conjunctivae normal.  Neck:     Trachea: No tracheal deviation.  Cardiovascular:     Rate and Rhythm: Normal rate and regular rhythm.  Pulmonary:     Effort: Pulmonary effort is normal. No respiratory distress.     Breath sounds: Normal breath sounds. No stridor. No wheezing or rales.  Abdominal:     General: Bowel sounds are normal. There is no distension.     Palpations: Abdomen is soft.     Tenderness: There is no abdominal tenderness. There is no guarding or rebound.     Comments: Mild ttp rlq, no masses or hernia  Musculoskeletal:        General: No tenderness.     Cervical back: Neck supple.  Skin:    General: Skin is warm and dry.     Findings: No rash.  Neurological:     Mental Status: He is alert.     Cranial Nerves: No cranial nerve deficit (no facial droop, extraocular movements intact, no slurred speech).     Sensory: No sensory deficit.     Motor: No abnormal muscle tone or seizure activity.     Coordination: Coordination normal.     ED Results / Procedures / Treatments   Labs (all labs ordered are listed, but only abnormal results are displayed) Labs Reviewed  URINALYSIS, ROUTINE W REFLEX MICROSCOPIC - Abnormal; Notable for the following components:      Result Value   Hgb urine dipstick SMALL (*)    Protein, ur 30 (*)    RBC / HPF >50 (*)    All other components within normal limits  COMPREHENSIVE METABOLIC PANEL - Abnormal; Notable for the following components:   Chloride 113 (*)    Creatinine, Ser 1.64 (*)    Calcium 8.3 (*)    Total Bilirubin 1.5 (*)    GFR calc non Af Amer 52 (*)    All other components within normal limits  CBC WITH DIFFERENTIAL/PLATELET  LIPASE, BLOOD    EKG None  Radiology CT RENAL STONE STUDY  Result Date: 02/01/2019 CLINICAL DATA:  Flank pain, history of ureteral stone; flank pain, kidney stone suspected. EXAM: CT ABDOMEN AND PELVIS WITHOUT CONTRAST  TECHNIQUE: Multidetector CT imaging of the abdomen and pelvis was performed following the standard protocol without IV contrast. COMPARISON:  CT abdomen/pelvis 08/16/2018. FINDINGS: Lower chest: There are scattered ground-glass opacities within the partially imaged right middle lobe and bilateral lower lobes, likely infectious/inflammatory. Hepatobiliary: The liver is normal in size and contour. Cholecystolithiasis. The common duct is  normal in caliber. Pancreas: Unremarkable Spleen: Incidentally noted small left upper quadrant splenule. Otherwise unremarkable. Adrenals/Urinary Tract: No adrenal gland mass. A 4 mm calculus is present within the proximal right ureter (series 2, image 51) (series 5, image 103). There is mild hydronephrosis and hydroureter proximal to this point. There is right perinephric fat stranding as well as mild stranding surrounding the proximal right ureter. There are numerous small bilateral renal collecting system calculi. The bladder is underdistended but otherwise unremarkable. STOMACH/BOWEL: The stomach is unremarkable.No dilated loops of bowel or appreciable bowel wall thickening.Normal appendix. VASCULAR/LYMPHATIC: No abdominal aortic aneurysm. No lymphadenopathy. REPRODUCTIVE: Incompletely assessed by CT modality.No pathologic findings. OTHER: No ascites.The body wall is normal. MUSCULOSKELETAL: No acute bony abnormality. Redemonstrated fusion across the ventral aspect of the sacroiliac joints bilaterally. IMPRESSION: 4 mm calculus within the proximal right ureter. Mild hydronephrosis and hydroureter proximal to this point. Right perinephric and proximal periureteral fat stranding. Correlate for pyelonephritis. Numerous additional small bilateral renal collecting system calculi. Patchy ground-glass opacities within the visualized right middle and bilateral lower lobes, likely infectious/inflammatory. Cholecystolithiasis. Redemonstrated bilateral sacroiliac fusion. Electronically Signed    By: Kellie Simmering DO   On: 02/01/2019 10:44    Procedures Procedures (including critical care time)  Medications Ordered in ED Medications  HYDROmorphone (DILAUDID) injection 1 mg (has no administration in time range)  ketorolac (TORADOL) 30 MG/ML injection 30 mg (30 mg Intravenous Given 02/01/19 1103)    ED Course  I have reviewed the triage vital signs and the nursing notes.  Pertinent labs & imaging results that were available during my care of the patient were reviewed by me and considered in my medical decision making (see chart for details).  Clinical Course as of Feb 01 1255  Thu Feb 01, 2019  1154 CT demonstrates a 4 mm right proximal ureteral stone   [JK]  1244 Creatinine elevated compared to previous.  Urinalysis shows hematuria   [JK]  1247 Patient still having pain.  Requests additional pain medication   [JK]    Clinical Course User Index [JK] Dorie Rank, MD   MDM Rules/Calculators/A&P                      Patient presented with acute onset of flank pain.  Symptoms were suggestive of recurrent ureteral stone.  Laboratory test showed hematuria and his CT scan demonstrates a 4 mm proximal ureteral stone.  Patient does have increased creatinine.  Patient was prescribed pain medications as well as Flomax.  Discussed close follow-up with his urologist.  Warning signs and precautions discussed. Final Clinical Impression(s) / ED Diagnoses Final diagnoses:  Ureteral stone with hydronephrosis    Rx / DC Orders ED Discharge Orders         Ordered    HYDROcodone-acetaminophen (NORCO/VICODIN) 5-325 MG tablet  Every 6 hours PRN     02/01/19 1256    ondansetron (ZOFRAN ODT) 8 MG disintegrating tablet  Every 8 hours PRN     02/01/19 1256    tamsulosin (FLOMAX) 0.4 MG CAPS capsule  Daily     02/01/19 1256           Dorie Rank, MD 02/01/19 1258

## 2019-02-01 NOTE — ED Notes (Signed)
Patient transported to CT at this time. 

## 2019-02-01 NOTE — Discharge Instructions (Addendum)
The CT scan showed a 4 mm kidney stone in the right ureter. Take the medications as prescribed.  Follow-up with your urologist for further treatment.  Return as needed for worsening symptoms

## 2019-02-07 ENCOUNTER — Other Ambulatory Visit (HOSPITAL_COMMUNITY)
Admission: RE | Admit: 2019-02-07 | Discharge: 2019-02-07 | Disposition: A | Discharge status: Home / Self Care | Payer: No Typology Code available for payment source | Source: Ambulatory Visit | Attending: Urology | Admitting: Urology

## 2019-02-07 ENCOUNTER — Other Ambulatory Visit: Payer: Self-pay | Admitting: Urology

## 2019-02-07 NOTE — Patient Instructions (Addendum)
DUE TO COVID-19 ONLY ONE VISITOR IS ALLOWED TO COME WITH YOU AND STAY IN THE WAITING ROOM ONLY DURING PRE OP AND PROCEDURE DAY OF SURGERY. THE 1 VISITOR MAY VISIT WITH YOU AFTER SURGERY IN YOUR PRIVATE ROOM DURING VISITING HOURS ONLY!              OVAL CAVAZOS   Your procedure is scheduled on: 02/09/19   Report to [facility] Main  Entrance   Report to admitting at 2:40 PM     Call this number if you have problems the morning of surgery [phone]    Remember: Do not eat food after Midnight.  You may have clear liquids until 11:30 AM    CLEAR LIQUID DIET   Foods Allowed                                                                     Foods Excluded  Coffee and tea, regular and decaf                             liquids that you cannot  Plain Jell-O any favor except red or purple                                           see through such as: Fruit ices (not with fruit pulp)                                     milk, soups, orange juice  Iced Popsicles                                    All solid food Carbonated beverages, regular and diet                                    Cranberry, grape and apple juices Sports drinks like Gatorade Lightly seasoned clear broth or consume(fat free) Sugar, honey syrup  _Nothing by mouth after 11:30 AM __________________________________________________________________  BRUSH YOUR TEETH MORNING OF SURGERY AND RINSE YOUR MOUTH OUT, NO CHEWING GUM CANDY OR MINTS.     Take these medicines the morning of surgery with A SIP OF WATER: Flomax                                 You may not have any metal on your body including              piercings  Do not wear jewelry,  lotions, powders or  deodorant                        Men may shave face and neck.   Do not bring valuables to the hospital. [facility] IS NOT  RESPONSIBLE   FOR VALUABLES.  Contacts, dentures or bridgework may not be worn into  surgery.       Patients discharged the day of surgery will not be allowed to drive home.   IF YOU ARE HAVING SURGERY AND GOING HOME THE SAME DAY, YOU MUST HAVE AN ADULT TO DRIVE YOU HOME AND BE WITH YOU FOR 24 HOURS.   YOU MAY GO HOME BY TAXI OR UBER OR ORTHERWISE, BUT AN ADULT MUST ACCOMPANY YOU HOME AND STAY WITH YOU FOR 24 HOURS.  Name and phone number of your driver:  Special Instructions: N/A              Please read over the following fact sheets you were given: _____________________________________________________________________             [facility] - Preparing for Surgery  Before surgery, you can play an important role.   Because skin is not sterile, your skin needs to be as free of germs as possible.   You can reduce the number of germs on your skin by washing with CHG (chlorahexidine gluconate) soap before surgery.   CHG is an antiseptic cleaner which kills germs and bonds with the skin to continue killing germs even after washing. Please DO NOT use if you have an allergy to CHG or antibacterial soaps.   If your skin becomes reddened/irritated stop using the CHG and inform your nurse when you arrive at Short Stay. .  You may shave your face/neck.  Please follow these instructions carefully:  1.  Shower with CHG Soap the night before surgery and the  morning of Surgery.  2.  If you choose to wash your hair, wash your hair first as usual with your  normal  shampoo.  3.  After you shampoo, rinse your hair and body thoroughly to remove the  shampoo.                                        4.  Use CHG as you would any other liquid soap.  You can apply chg directly  to the skin and wash                       Gently with a scrungie or clean washcloth.  5.  Apply the CHG Soap to your body ONLY FROM THE NECK DOWN.   Do not use on face/ open                           Wound or open sores. Avoid contact with eyes, ears mouth and genitals (private parts).                        Wash face,  Genitals (private parts) with your normal soap.             6.  Wash thoroughly, paying special attention to the area where your surgery  will be performed.  7.  Thoroughly rinse your body with warm water from the neck down.  8.  DO NOT shower/wash with your normal soap after using and rinsing off  the CHG Soap.             9.  Pat yourself dry with a clean towel.  10.  Wear clean pajamas.            11.  Place clean sheets on your bed the night of your first shower and do not  sleep with pets. Day of Surgery : Do not apply any lotions/deodorants the morning of surgery.  Please wear clean clothes to the hospital/surgery center.  FAILURE TO FOLLOW THESE INSTRUCTIONS MAY RESULT IN THE CANCELLATION OF YOUR SURGERY PATIENT SIGNATURE_________________________________  NURSE SIGNATURE__________________________________  ________________________________________________________________________

## 2019-02-07 NOTE — Progress Notes (Signed)
Pt was not tested today covid today due to him testing + 15 days ago from a [facility] test. Pt able to produce results. Pt can still have the scheduled procedure based on the guidelines of no retesting for 90 days.

## 2019-02-08 ENCOUNTER — Encounter (HOSPITAL_COMMUNITY)
Admission: RE | Admit: 2019-02-08 | Discharge: 2019-02-08 | Disposition: A | Discharge status: Home / Self Care | Payer: No Typology Code available for payment source | Source: Ambulatory Visit | Attending: Urology | Admitting: Urology

## 2019-02-08 ENCOUNTER — Other Ambulatory Visit: Payer: Self-pay | Admitting: Urology

## 2019-02-08 ENCOUNTER — Other Ambulatory Visit: Payer: Self-pay

## 2019-02-08 ENCOUNTER — Encounter (HOSPITAL_COMMUNITY): Payer: Self-pay

## 2019-02-08 HISTORY — DX: Personal history of urinary calculi: Z87.442

## 2019-02-08 NOTE — Progress Notes (Signed)
PCP - Dr. Gasper Lloyd Cardiologist - no  Chest x-ray - no EKG - no Stress Test - no ECHO - no Cardiac Cath - no  Sleep Study -  CPAP - no  Fasting Blood Sugar -NA  Checks Blood Sugar _____ times a day  Blood Thinner Instructions:no Aspirin Instructions: Last Dose:  Anesthesia review:   Patient denies shortness of breath, fever, cough and chest pain at PAT appointment  yes Patient verbalized understanding of instructions that were given to them at the PAT appointment. Patient was also instructed that they will need to review over the PAT instructions again at home before surgery. yes

## 2019-02-09 ENCOUNTER — Ambulatory Visit (HOSPITAL_COMMUNITY)
Admission: RE | Admit: 2019-02-09 | Discharge: 2019-02-09 | Disposition: A | Discharge status: Home / Self Care | Payer: No Typology Code available for payment source | Attending: Urology | Admitting: Urology

## 2019-02-09 ENCOUNTER — Ambulatory Visit (HOSPITAL_COMMUNITY): Payer: No Typology Code available for payment source | Admitting: Physician Assistant

## 2019-02-09 ENCOUNTER — Encounter (HOSPITAL_COMMUNITY)
Admission: RE | Disposition: A | Discharge status: Home / Self Care | Payer: Self-pay | Source: Home / Self Care | Attending: Urology

## 2019-02-09 ENCOUNTER — Other Ambulatory Visit: Payer: Self-pay

## 2019-02-09 ENCOUNTER — Ambulatory Visit (HOSPITAL_COMMUNITY): Payer: No Typology Code available for payment source | Admitting: Anesthesiology

## 2019-02-09 ENCOUNTER — Ambulatory Visit (HOSPITAL_COMMUNITY): Payer: No Typology Code available for payment source

## 2019-02-09 ENCOUNTER — Encounter (HOSPITAL_COMMUNITY): Payer: Self-pay | Admitting: Urology

## 2019-02-09 DIAGNOSIS — Z6839 Body mass index (BMI) 39.0-39.9, adult: Secondary | ICD-10-CM | POA: Diagnosis not present

## 2019-02-09 DIAGNOSIS — Z87891 Personal history of nicotine dependence: Secondary | ICD-10-CM | POA: Diagnosis not present

## 2019-02-09 DIAGNOSIS — Z87442 Personal history of urinary calculi: Secondary | ICD-10-CM | POA: Insufficient documentation

## 2019-02-09 DIAGNOSIS — N202 Calculus of kidney with calculus of ureter: Secondary | ICD-10-CM | POA: Diagnosis not present

## 2019-02-09 DIAGNOSIS — I10 Essential (primary) hypertension: Secondary | ICD-10-CM | POA: Diagnosis not present

## 2019-02-09 HISTORY — PX: CYSTOSCOPY WITH RETROGRADE PYELOGRAM, URETEROSCOPY AND STENT PLACEMENT: SHX5789

## 2019-02-09 SURGERY — CYSTOURETEROSCOPY, WITH RETROGRADE PYELOGRAM AND STENT INSERTION
Anesthesia: General | Laterality: Bilateral

## 2019-02-09 MED ORDER — OXYCODONE HCL 5 MG PO TABS: 5.0000 mg | ORAL_TABLET | Freq: Once | ORAL | Status: DC | PRN

## 2019-02-09 MED ORDER — HYDROMORPHONE HCL 1 MG/ML IJ SOLN: 0.2500 mg | INTRAMUSCULAR | Status: DC | PRN

## 2019-02-09 MED ORDER — ACETAMINOPHEN 500 MG PO TABS
1000.0000 mg | ORAL_TABLET | Freq: Once | ORAL | Status: AC
  Administered 2019-02-09: 1000 mg via ORAL
  Filled 2019-02-09: qty 2

## 2019-02-09 MED ORDER — ONDANSETRON HCL 4 MG/2ML IJ SOLN
INTRAMUSCULAR | Status: AC
  Filled 2019-02-09: qty 2

## 2019-02-09 MED ORDER — IOHEXOL 300 MG/ML  SOLN
INTRAMUSCULAR | Status: DC | PRN
  Administered 2019-02-09: 22 mL

## 2019-02-09 MED ORDER — PROPOFOL 10 MG/ML IV BOLUS
INTRAVENOUS | Status: DC | PRN
  Administered 2019-02-09: 250 mg via INTRAVENOUS

## 2019-02-09 MED ORDER — LIDOCAINE 2% (20 MG/ML) 5 ML SYRINGE
INTRAMUSCULAR | Status: DC | PRN
  Administered 2019-02-09: 100 mg via INTRAVENOUS

## 2019-02-09 MED ORDER — DEXAMETHASONE SODIUM PHOSPHATE 10 MG/ML IJ SOLN
INTRAMUSCULAR | Status: AC
  Filled 2019-02-09: qty 1

## 2019-02-09 MED ORDER — SODIUM CHLORIDE 0.9 % IR SOLN
Status: DC | PRN
  Administered 2019-02-09: 3000 mL via INTRAVESICAL

## 2019-02-09 MED ORDER — DEXAMETHASONE SODIUM PHOSPHATE 4 MG/ML IJ SOLN
INTRAMUSCULAR | Status: DC | PRN
  Administered 2019-02-09: 10 mg via INTRAVENOUS

## 2019-02-09 MED ORDER — LACTATED RINGERS IV SOLN: INTRAVENOUS | Status: DC

## 2019-02-09 MED ORDER — LIDOCAINE 2% (20 MG/ML) 5 ML SYRINGE
INTRAMUSCULAR | Status: AC
  Filled 2019-02-09: qty 5

## 2019-02-09 MED ORDER — PROMETHAZINE HCL 25 MG/ML IJ SOLN: 6.2500 mg | INTRAMUSCULAR | Status: DC | PRN

## 2019-02-09 MED ORDER — SODIUM CHLORIDE 0.9 % IV SOLN
2.0000 g | INTRAVENOUS | Status: AC
  Administered 2019-02-09: 16:00:00 2 g via INTRAVENOUS
  Filled 2019-02-09: qty 20

## 2019-02-09 MED ORDER — PROPOFOL 10 MG/ML IV BOLUS
INTRAVENOUS | Status: AC
  Filled 2019-02-09: qty 20

## 2019-02-09 MED ORDER — OXYCODONE HCL 5 MG/5ML PO SOLN: 5.0000 mg | Freq: Once | ORAL | Status: DC | PRN

## 2019-02-09 MED ORDER — FENTANYL CITRATE (PF) 100 MCG/2ML IJ SOLN
INTRAMUSCULAR | Status: DC | PRN
  Administered 2019-02-09: 100 ug via INTRAVENOUS

## 2019-02-09 MED ORDER — OXYCODONE-ACETAMINOPHEN 5-325 MG PO TABS: 1.0000 | ORAL_TABLET | Freq: Four times a day (QID) | ORAL | 0 refills | Status: DC | PRN

## 2019-02-09 MED ORDER — MIDAZOLAM HCL 2 MG/2ML IJ SOLN
INTRAMUSCULAR | Status: AC
  Filled 2019-02-09: qty 2

## 2019-02-09 MED ORDER — ONDANSETRON HCL 4 MG/2ML IJ SOLN
INTRAMUSCULAR | Status: DC | PRN
  Administered 2019-02-09: 4 mg via INTRAVENOUS

## 2019-02-09 MED ORDER — FENTANYL CITRATE (PF) 100 MCG/2ML IJ SOLN
INTRAMUSCULAR | Status: AC
  Filled 2019-02-09: qty 2

## 2019-02-09 MED ORDER — MIDAZOLAM HCL 2 MG/2ML IJ SOLN
INTRAMUSCULAR | Status: DC | PRN
  Administered 2019-02-09: 2 mg via INTRAVENOUS

## 2019-02-09 SURGICAL SUPPLY — 26 items
BAG URO CATCHER STRL LF (MISCELLANEOUS) ×2 IMPLANT
BASKET LASER NITINOL 1.9FR (BASKET) IMPLANT
BSKT STON RTRVL 120 1.9FR (BASKET)
CATH INTERMIT  6FR 70CM (CATHETERS) ×2 IMPLANT
CLOTH BEACON ORANGE TIMEOUT ST (SAFETY) ×2 IMPLANT
COVER SURGICAL LIGHT HANDLE (MISCELLANEOUS) ×2 IMPLANT
COVER WAND RF STERILE (DRAPES) IMPLANT
EXTRACTOR STONE 1.7FRX115CM (UROLOGICAL SUPPLIES) IMPLANT
FIBER LASER FLEXIVA 1000 (UROLOGICAL SUPPLIES) IMPLANT
FIBER LASER FLEXIVA 365 (UROLOGICAL SUPPLIES) IMPLANT
FIBER LASER FLEXIVA 550 (UROLOGICAL SUPPLIES) IMPLANT
FIBER LASER TRAC TIP (UROLOGICAL SUPPLIES) IMPLANT
GLOVE BIOGEL M STRL SZ7.5 (GLOVE) ×2 IMPLANT
GOWN STRL REUS W/TWL LRG LVL3 (GOWN DISPOSABLE) ×2 IMPLANT
GUIDEWIRE ANG ZIPWIRE 038X150 (WIRE) ×4 IMPLANT
GUIDEWIRE STR DUAL SENSOR (WIRE) ×3 IMPLANT
KIT TURNOVER KIT A (KITS) IMPLANT
MANIFOLD NEPTUNE II (INSTRUMENTS) ×2 IMPLANT
PACK CYSTO (CUSTOM PROCEDURE TRAY) ×2 IMPLANT
PENCIL SMOKE EVACUATOR (MISCELLANEOUS) IMPLANT
SHEATH URETERAL 12FRX28CM (UROLOGICAL SUPPLIES) ×1 IMPLANT
SHEATH URETERAL 12FRX35CM (MISCELLANEOUS) IMPLANT
STENT POLARIS 5FRX26 (STENTS) ×4 IMPLANT
TUBE FEEDING 8FR 16IN STR KANG (MISCELLANEOUS) ×2 IMPLANT
TUBING CONNECTING 10 (TUBING) ×2 IMPLANT
TUBING UROLOGY SET (TUBING) ×2 IMPLANT

## 2019-02-09 NOTE — Anesthesia Preprocedure Evaluation (Addendum)
Anesthesia Evaluation  Patient identified by MRN, date of birth, ID band Patient awake    Reviewed: Allergy & Precautions, NPO status , Patient's Chart, lab work & pertinent test results  Airway Mallampati: II  TM Distance: >3 FB Neck ROM: Full    Dental no notable dental hx. (+) Teeth Intact, Dental Advisory Given   Pulmonary neg pulmonary ROS, former smoker,    Pulmonary exam normal breath sounds clear to auscultation       Cardiovascular hypertension, Normal cardiovascular exam Rhythm:Regular Rate:Normal  Hypertensive- no meds   Neuro/Psych  Headaches, PSYCHIATRIC DISORDERS Anxiety    GI/Hepatic Neg liver ROS, GERD  Controlled,  Endo/Other  Morbid obesityBMI 40  Renal/GU Renal diseaseB/L renal and ureteral stones  negative genitourinary   Musculoskeletal negative musculoskeletal ROS (+)   Abdominal (+) + obese,   Peds negative pediatric ROS (+)  Hematology negative hematology ROS (+)   Anesthesia Other Findings   Reproductive/Obstetrics negative OB ROS                           Anesthesia Physical Anesthesia Plan  ASA: III  Anesthesia Plan: General   Post-op Pain Management:    Induction: Intravenous  PONV Risk Score and Plan: 3 and Ondansetron, Dexamethasone, Treatment may vary due to age or medical condition and Midazolam  Airway Management Planned: LMA  Additional Equipment: None  Intra-op Plan:   Post-operative Plan: Extubation in OR  Informed Consent: I have reviewed the patients History and Physical, chart, labs and discussed the procedure including the risks, benefits and alternatives for the proposed anesthesia with the patient or authorized representative who has indicated his/her understanding and acceptance.     Dental advisory given  Plan Discussed with: CRNA  Anesthesia Plan Comments:         Anesthesia Quick Evaluation

## 2019-02-09 NOTE — Transfer of Care (Signed)
Immediate Anesthesia Transfer of Care Note  Patient: Marcus Burns  Procedure(s) Performed: CYSTOSCOPY WITH RETROGRADE PYELOGRAM, DIAGNOSTIC URETEROSCOPY AND STENT PLACEMENT (Bilateral )  Patient Location: PACU  Anesthesia Type:General  Level of Consciousness: awake and patient cooperative  Airway & Oxygen Therapy: Patient Spontanous Breathing and Patient connected to face mask  Post-op Assessment: Report given to RN and Post -op Vital signs reviewed and stable  Post vital signs: Reviewed and stable  Last Vitals:  Vitals Value Taken Time  BP 150/105 02/09/19 1707  Temp 36.8 C 02/09/19 1707  Pulse 80 02/09/19 1709  Resp 18 02/09/19 1709  SpO2 100 % 02/09/19 1709  Vitals shown include unvalidated device data.  Last Pain:  Vitals:   02/09/19 1506  TempSrc:   PainSc: 2       Patients Stated Pain Goal: 4 (02/09/19 1506)  Complications: No apparent anesthesia complications

## 2019-02-09 NOTE — Discharge Instructions (Signed)
1 - You may have urinary urgency (bladder spasms) and bloody urine on / off with stent in place. This is normal.  2 - Call MD or go to ER for fever >102, severe pain / nausea / vomiting not relieved by medications, or acute change in medical status   General Anesthesia, Adult, Care After This sheet gives you information about how to care for yourself after your procedure. Your health care provider may also give you more specific instructions. If you have problems or questions, contact your health care provider. What can I expect after the procedure? After the procedure, the following side effects are common:  Pain or discomfort at the IV site.  Nausea.  Vomiting.  Sore throat.  Trouble concentrating.  Feeling cold or chills.  Weak or tired.  Sleepiness and fatigue.  Soreness and body aches. These side effects can affect parts of the body that were not involved in surgery. Follow these instructions at home:  For at least 24 hours after the procedure:  Have a responsible adult stay with you. It is important to have someone help care for you until you are awake and alert.  Rest as needed.  Do not: ? Participate in activities in which you could fall or become injured. ? Drive. ? Use heavy machinery. ? Drink alcohol. ? Take sleeping pills or medicines that cause drowsiness. ? Make important decisions or sign legal documents. ? Take care of children on your own. Eating and drinking  Follow any instructions from your health care provider about eating or drinking restrictions.  When you feel hungry, start by eating small amounts of foods that are soft and easy to digest (bland), such as toast. Gradually return to your regular diet.  Drink enough fluid to keep your urine pale yellow.  If you vomit, rehydrate by drinking water, juice, or clear broth. General instructions  If you have sleep apnea, surgery and certain medicines can increase your risk for breathing problems.  Follow instructions from your health care provider about wearing your sleep device: ? Anytime you are sleeping, including during daytime naps. ? While taking prescription pain medicines, sleeping medicines, or medicines that make you drowsy.  Return to your normal activities as told by your health care provider. Ask your health care provider what activities are safe for you.  Take over-the-counter and prescription medicines only as told by your health care provider.  If you smoke, do not smoke without supervision.  Keep all follow-up visits as told by your health care provider. This is important. Contact a health care provider if:  You have nausea or vomiting that does not get better with medicine.  You cannot eat or drink without vomiting.  You have pain that does not get better with medicine.  You are unable to pass urine.  You develop a skin rash.  You have a fever.  You have redness around your IV site that gets worse. Get help right away if:  You have difficulty breathing.  You have chest pain.  You have blood in your urine or stool, or you vomit blood. Summary  After the procedure, it is common to have a sore throat or nausea. It is also common to feel tired.  Have a responsible adult stay with you for the first 24 hours after general anesthesia. It is important to have someone help care for you until you are awake and alert.  When you feel hungry, start by eating small amounts of foods that are soft   and easy to digest (bland), such as toast. Gradually return to your regular diet.  Drink enough fluid to keep your urine pale yellow.  Return to your normal activities as told by your health care provider. Ask your health care provider what activities are safe for you. This information is not intended to replace advice given to you by your health care provider. Make sure you discuss any questions you have with your health care provider. Document Revised: 12/24/2016  Document Reviewed: 08/06/2016 Elsevier Patient Education  2020 [employer]   

## 2019-02-09 NOTE — Op Note (Signed)
NAME: Marcus Burns, Marcus Burns MEDICAL RECORD [id number] ACCOUNT [id number] DATE OF BIRTH:February 18, 1980 FACILITY: [facility] LOCATION: Lona Millard, MD  OPERATIVE REPORT  DATE OF PROCEDURE:  02/09/2019  SURGEON:  Alexis Frock, MD  PREOPERATIVE DIAGNOSES:   1.  Right ureteral and bilateral renal stones. 2.  History of recurrent urolithiasis.  PROCEDURE: 1.  Cystoscopy, bilateral retrograde pyelograms, interpretation. 2.  Bilateral diagnostic ureteroscopy. 3.  Insertion of bilateral ureteral stents, 5 x 26 Polaris, no tether.  ESTIMATED BLOOD LOSS:  Nil.  COMPLICATIONS:  None.  SPECIMENS:  None.  FINDINGS: 1.  Filling defect in the right midureter consistent with known stone. 2.  Very narrow caliber  bilateral ureters in the true pelvis, unable to ureteroscopically access kidney. 3.  Successful placement of bilateral ureteral stents, proximal end in renal pelvis, distal end in urinary bladder.  INDICATIONS:  The patient is a very pleasant 39 year old man who unfortunately has had recurrent urolithiasis for years.  He has passed most of the stones medically.  He has had a relative increase in the frequency of colic.  He was found recently on  workup for colicky flank pain to have a right ureteral stone, as well as multifocal bilateral renal stones.  Options were discussed for management including continued medical therapy versus shockwave lithotripsy versus ureteroscopy, unilateral versus  bilateral and he wished to proceed with a path towards stone free with bilateral ureteroscopy and then metabolic evaluation and treatment.  He presents today for bilateral ureteroscopic stone manipulation.  Informed consent was obtained and placed in the  medical record.  DESCRIPTION OF PROCEDURE:  The patient being identified,  the procedure being bilateral ureteroscopic stimulation was confirmed.  Procedure time-out was performed.  Intravenous antibiotics administered.  General LMA  anesthesia induced.  The patient was  placed into a low lithotomy position.  A sterile field was created, prepping and draping his penis, perineum and proximal thighs using iodine.  Cystourethroscopy was performed 21-French rigid cystoscope with offset lens.  The anterior and posterior  urethra was unremarkable.  Inspection of bladder revealed no diverticula, calcifications, papillary lesions.  Ureteral orifices were singleton.  The left ureteral orifice was cannulated with a 6-French renal catheter and left retrograde pyelogram was  obtained.  Left retrograde pyelogram demonstrated a single left ureter, single system left kidney.  No filling defects or narrowing noted.  A 0.038 ZIPwire was advanced to lower pole, set aside as a safety wire.  Similarly, right retrograde pyelogram was obtained.  Right retrograde pyelogram demonstrated a single right ureter with single system right kidney.  There was a questionable filling defect in the midureter consistent with known stone.  A 0.038 ZIPwire was advanced to lower pole, set aside as a safety wire.   An 8-French feeding tube was placed in the urinary bladder for pressure release.  Then semirigid ureteroscopy was performed of distal right ureter alongside a separate sensor working wire.  There were no mucosal abnormalities in the distal half of the  right ureter.  We did not visualize the stone with semirigid technique.  A sensor wire was left in place acting as a working wire on the right side.  Similarly, semirigid ureteroscopy was performed of the left ureter using a semirigid ureteroscope of the  distal half of the left ureter and no mucosal abnormalities were found.  The ureters were somewhat narrow caliber, but did easily accommodate dual wire and semirigid scope.  Therefore, it was felt that attempt at access sheath placement and flexible  ureteroscopy  would be prudent.  As such, a short length, 12/14 ureteral access sheath was carefully placed over the  sensor working wire on the right side to the level of the mid ureter and flexible ureteroscopy was performed of the right mid ureter.  The  caliber became even more narrow proceeding proximally towards the level of the stone, such that it would not accommodate even a single channel ureteroscope, flexible type.  As such, we felt the most prudent means of management would be bilateral stent  placement to allow for passive dilation and reattempt ureteroscopy in approximately 2-3 weeks.  As such, the access sheath was removed under continuous vision.  No mucosal abnormalities were found and a new 5 x 26 Polaris-type stent was placed over the  remaining safety wire on the right side using fluoroscopic guidance.  Good proximal and distal plane were noted.  Similarly, a separate 5 x 26 Polaris-type stent was placed with remaining safety wire on the left using fluoroscopic guidance.  Good  proximal and distal plane were noted and the procedure was terminated.  The patient tolerated the procedure well.  No immediate perioperative complications.  The patient was taken to postanesthesia care unit in stable condition with plan for discharge  home.  VN/NUANCE  D:02/09/2019 T:02/09/2019 JOB:[id number]

## 2019-02-09 NOTE — Brief Op Note (Signed)
02/09/2019  4:55 PM  PATIENT:  Rodrigo Ran  39 y.o. male  PRE-OPERATIVE DIAGNOSIS:  BILATERAL RENAL AND URETERAL STONES  POST-OPERATIVE DIAGNOSIS:  BILATERAL RENAL AND URETERAL STONES  PROCEDURE:  Procedure(s) with comments: CYSTOSCOPY WITH RETROGRADE PYELOGRAM, DIAGNOSTIC URETEROSCOPY AND STENT PLACEMENT (Bilateral) - 75 MINS  SURGEON:  Surgeon(s) and Role:    Sebastian Ache, MD - Primary  PHYSICIAN ASSISTANT:   ASSISTANTS: none   ANESTHESIA:   general  EBL:  0 mL   BLOOD ADMINISTERED:none  DRAINS: none    LOCAL MEDICATIONS USED:  NONE  SPECIMEN:  No Specimen  DISPOSITION OF SPECIMEN:  N/A  COUNTS:  YES  TOURNIQUET:  * No tourniquets in log *  DICTATION: .Other Dictation: Dictation Number [id number]  PLAN OF CARE: Discharge to home after PACU  PATIENT DISPOSITION:  PACU - hemodynamically stable.   Delay start of Pharmacological VTE agent (>24hrs) due to surgical blood loss or risk of bleeding: yes

## 2019-02-09 NOTE — H&P (Signed)
Marcus Burns is an 39 y.o. male.    Chief Complaint: Pre-Op BILATERAL Ureteroscopic Stone Manipulation  HPI:   1 - Recurrent Urolithiasis -  Pre 2021 - medical passage x many   02/2019 - 90mm Rt mid stone (L4TP) by ER CT. Bilateral small renal stones as well (45mm x 4 each side). Not visible by KUB 02/06/19.   2 - Medical STone Disease -  Eval 2021: BMP, PTH, Urate - pending; Composition - 90% CaOx / 10% Urate; 24 Hr Urines - pending   PMH sig for obesity, LIH. His PCP is Oliver Barre MD. 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   Today " Marcus Burns " is seen to proceed with BILATERAL ureteroscopy for Rt ureteral ant bilateral renal stones with goal of stone free. No interval fevers. Most recent UA without infectious parameters.     Past Medical History:  Diagnosis Date  . Acute prostatitis 06/19/2007  . ALLERGIC RHINITIS 06/19/2007  . Anxiety   . ANXIETY 06/19/2007  . ELEVATED BLOOD PRESSURE WITHOUT DIAGNOSIS OF HYPERTENSION 06/19/2007  . GERD 06/19/2007  . GERD (gastroesophageal reflux disease)   . History of kidney stones   . Inguinal hernia   . Kidney stone   . Migraines   . NEPHROLITHIASIS, HX OF 06/19/2007    Past Surgical History:  Procedure Laterality Date  . HERNIA REPAIR      Family History  Problem Relation Age of Onset  . Coronary artery disease Father   . Anxiety disorder Mother   . Anxiety disorder Brother   . Diabetes Brother    Social History:  reports that he has quit smoking. He uses smokeless tobacco. He reports that he does not drink alcohol or use drugs.  Allergies:  Allergies  Allergen Reactions  . Doxycycline Other (See Comments)    Light headed , passed out    No medications prior to admission.    No results found for this or any previous visit (from the past 48 hour(s)). No results found.  Review of Systems  Constitutional: Negative for chills and fever.  Genitourinary: Positive for flank pain.  All other  systems reviewed and are negative.   There were no vitals taken for this visit. Physical Exam  Constitutional: He appears well-developed.  Very pleasant.   Eyes: Pupils are equal, round, and reactive to light.  Cardiovascular: Normal rate.  Respiratory: Effort normal.  GI: Soft.  Stable truncal obesity.   Musculoskeletal:     Cervical back: Normal range of motion.     Assessment/Plan  Proceed as planned with BILATERAL ureteroscopic stone manipulation with goal of stone free. Risks, benefits, alternatives, expected peri-op course discussed previously and reiterated today including need for at least temporary post-op stents.   Sebastian Ache, MD 02/09/2019, 7:26 AM

## 2019-02-09 NOTE — Anesthesia Procedure Notes (Signed)
Procedure Name: LMA Insertion Date/Time: 02/09/2019 4:17 PM Performed by: Vanessa [place], CRNA Pre-anesthesia Checklist: Emergency Drugs available, Patient identified, Suction available and Patient being monitored Patient Re-evaluated:Patient Re-evaluated prior to induction Oxygen Delivery Method: Circle system utilized Preoxygenation: Pre-oxygenation with 100% oxygen Induction Type: IV induction Ventilation: Mask ventilation without difficulty LMA: LMA inserted LMA Size: 5.0 Number of attempts: 1 Placement Confirmation: positive ETCO2 and breath sounds checked- equal and bilateral Tube secured with: Tape Dental Injury: Teeth and Oropharynx as per pre-operative assessment

## 2019-02-10 NOTE — Anesthesia Postprocedure Evaluation (Signed)
Anesthesia Post Note  Patient: Marcus Burns  Procedure(s) Performed: CYSTOSCOPY WITH RETROGRADE PYELOGRAM, DIAGNOSTIC URETEROSCOPY AND STENT PLACEMENT (Bilateral )     Patient location during evaluation: PACU Anesthesia Type: General Level of consciousness: awake and alert, oriented and patient cooperative Pain management: pain level controlled Vital Signs Assessment: post-procedure vital signs reviewed and stable Respiratory status: spontaneous breathing, nonlabored ventilation and respiratory function stable Cardiovascular status: blood pressure returned to baseline and stable Postop Assessment: no apparent nausea or vomiting Anesthetic complications: no    Last Vitals:  Vitals:   02/09/19 1745 02/09/19 1815  BP: (!) 133/93 (!) 138/97  Pulse: 78 76  Resp: 14   Temp: 36.5 C 36.5 C  SpO2: 97% 96%    Last Pain:  Vitals:   02/09/19 1815  TempSrc:   PainSc: 0-No pain                 Lannie Fields

## 2019-02-13 ENCOUNTER — Other Ambulatory Visit: Payer: Self-pay | Admitting: Urology

## 2019-02-13 ENCOUNTER — Encounter (HOSPITAL_COMMUNITY): Payer: Self-pay | Admitting: Urology

## 2019-02-21 ENCOUNTER — Other Ambulatory Visit: Payer: Self-pay

## 2019-02-21 ENCOUNTER — Encounter (HOSPITAL_COMMUNITY): Payer: Self-pay | Admitting: Urology

## 2019-02-23 ENCOUNTER — Other Ambulatory Visit: Payer: Self-pay

## 2019-02-23 ENCOUNTER — Ambulatory Visit (HOSPITAL_COMMUNITY)
Admission: RE | Admit: 2019-02-23 | Discharge: 2019-02-23 | Disposition: A | Discharge status: Home / Self Care | Payer: No Typology Code available for payment source | Attending: Urology | Admitting: Urology

## 2019-02-23 ENCOUNTER — Ambulatory Visit (HOSPITAL_COMMUNITY): Payer: No Typology Code available for payment source | Admitting: Physician Assistant

## 2019-02-23 ENCOUNTER — Encounter (HOSPITAL_COMMUNITY): Payer: Self-pay | Admitting: Urology

## 2019-02-23 ENCOUNTER — Ambulatory Visit (HOSPITAL_COMMUNITY): Payer: No Typology Code available for payment source

## 2019-02-23 ENCOUNTER — Encounter (HOSPITAL_COMMUNITY)
Admission: RE | Disposition: A | Discharge status: Home / Self Care | Payer: Self-pay | Source: Home / Self Care | Attending: Urology

## 2019-02-23 DIAGNOSIS — Z8616 Personal history of COVID-19: Secondary | ICD-10-CM | POA: Insufficient documentation

## 2019-02-23 DIAGNOSIS — Z87891 Personal history of nicotine dependence: Secondary | ICD-10-CM | POA: Diagnosis not present

## 2019-02-23 DIAGNOSIS — N200 Calculus of kidney: Secondary | ICD-10-CM | POA: Diagnosis present

## 2019-02-23 DIAGNOSIS — N202 Calculus of kidney with calculus of ureter: Secondary | ICD-10-CM | POA: Diagnosis not present

## 2019-02-23 DIAGNOSIS — E669 Obesity, unspecified: Secondary | ICD-10-CM | POA: Insufficient documentation

## 2019-02-23 DIAGNOSIS — Z87442 Personal history of urinary calculi: Secondary | ICD-10-CM | POA: Diagnosis not present

## 2019-02-23 DIAGNOSIS — Z79899 Other long term (current) drug therapy: Secondary | ICD-10-CM | POA: Diagnosis not present

## 2019-02-23 DIAGNOSIS — Z6839 Body mass index (BMI) 39.0-39.9, adult: Secondary | ICD-10-CM | POA: Insufficient documentation

## 2019-02-23 HISTORY — PX: CYSTOSCOPY WITH RETROGRADE PYELOGRAM, URETEROSCOPY AND STENT PLACEMENT: SHX5789

## 2019-02-23 HISTORY — PX: HOLMIUM LASER APPLICATION: SHX5852

## 2019-02-23 LAB — CBC
HCT: 40.8 % (ref 39.0–52.0)
Hemoglobin: 13.2 g/dL (ref 13.0–17.0)
MCH: 28.6 pg (ref 26.0–34.0)
MCHC: 32.4 g/dL (ref 30.0–36.0)
MCV: 88.5 fL (ref 80.0–100.0)
Platelets: 299 10*3/uL (ref 150–400)
RBC: 4.61 MIL/uL (ref 4.22–5.81)
RDW: 13.2 % (ref 11.5–15.5)
WBC: 5.4 10*3/uL (ref 4.0–10.5)
nRBC: 0 % (ref 0.0–0.2)

## 2019-02-23 LAB — BASIC METABOLIC PANEL
Anion gap: 11 (ref 5–15)
BUN: 13 mg/dL (ref 6–20)
CO2: 26 mmol/L (ref 22–32)
Calcium: 9.8 mg/dL (ref 8.9–10.3)
Chloride: 106 mmol/L (ref 98–111)
Creatinine, Ser: 1.07 mg/dL (ref 0.61–1.24)
GFR calc Af Amer: >60 mL/min (ref >60)
GFR calc non Af Amer: >60 mL/min (ref >60)
Glucose, Bld: 102 mg/dL — ABNORMAL HIGH (ref 70–99)
Potassium: 3.7 mmol/L (ref 3.5–5.1)
Sodium: 143 mmol/L (ref 135–145)

## 2019-02-23 SURGERY — CYSTOURETEROSCOPY, WITH RETROGRADE PYELOGRAM AND STENT INSERTION
Anesthesia: General | Laterality: Bilateral

## 2019-02-23 MED ORDER — ONDANSETRON HCL 4 MG/2ML IJ SOLN
INTRAMUSCULAR | Status: DC | PRN
  Administered 2019-02-23: 4 mg via INTRAVENOUS

## 2019-02-23 MED ORDER — FENTANYL CITRATE (PF) 100 MCG/2ML IJ SOLN
INTRAMUSCULAR | Status: AC
  Filled 2019-02-23: qty 2

## 2019-02-23 MED ORDER — MIDAZOLAM HCL 2 MG/2ML IJ SOLN
INTRAMUSCULAR | Status: AC
  Filled 2019-02-23: qty 2

## 2019-02-23 MED ORDER — KETOROLAC TROMETHAMINE 10 MG PO TABS: 10.0000 mg | ORAL_TABLET | Freq: Four times a day (QID) | ORAL | 0 refills | Status: DC | PRN

## 2019-02-23 MED ORDER — PROMETHAZINE HCL 25 MG/ML IJ SOLN: 6.2500 mg | INTRAMUSCULAR | Status: DC | PRN

## 2019-02-23 MED ORDER — GENTAMICIN SULFATE 40 MG/ML IJ SOLN
5.0000 mg/kg | INTRAVENOUS | Status: AC
  Administered 2019-02-23: 16:00:00 550 mg via INTRAVENOUS
  Filled 2019-02-23: qty 13.75

## 2019-02-23 MED ORDER — OXYCODONE HCL 5 MG PO TABS: 5.0000 mg | ORAL_TABLET | Freq: Once | ORAL | Status: DC | PRN

## 2019-02-23 MED ORDER — DEXAMETHASONE SODIUM PHOSPHATE 10 MG/ML IJ SOLN
INTRAMUSCULAR | Status: DC | PRN
  Administered 2019-02-23: 10 mg via INTRAVENOUS

## 2019-02-23 MED ORDER — FENTANYL CITRATE (PF) 100 MCG/2ML IJ SOLN
INTRAMUSCULAR | Status: DC | PRN
  Administered 2019-02-23 (×2): 25 ug via INTRAVENOUS
  Administered 2019-02-23 (×3): 50 ug via INTRAVENOUS

## 2019-02-23 MED ORDER — LIDOCAINE 2% (20 MG/ML) 5 ML SYRINGE
INTRAMUSCULAR | Status: AC
  Filled 2019-02-23: qty 5

## 2019-02-23 MED ORDER — OXYCODONE-ACETAMINOPHEN 5-325 MG PO TABS: 1.0000 | ORAL_TABLET | Freq: Four times a day (QID) | ORAL | 0 refills | Status: DC | PRN

## 2019-02-23 MED ORDER — IOHEXOL 300 MG/ML  SOLN
INTRAMUSCULAR | Status: DC | PRN
  Administered 2019-02-23: 30 mL via URETHRAL

## 2019-02-23 MED ORDER — SODIUM CHLORIDE 0.9 % IR SOLN
Status: DC | PRN
  Administered 2019-02-23: 3000 mL

## 2019-02-23 MED ORDER — KETOROLAC TROMETHAMINE 30 MG/ML IJ SOLN
30.0000 mg | Freq: Once | INTRAMUSCULAR | Status: AC | PRN
  Administered 2019-02-23: 19:00:00 30 mg via INTRAVENOUS

## 2019-02-23 MED ORDER — PROPOFOL 500 MG/50ML IV EMUL
INTRAVENOUS | Status: DC | PRN
  Administered 2019-02-23: 200 mg via INTRAVENOUS

## 2019-02-23 MED ORDER — PROPOFOL 10 MG/ML IV BOLUS
INTRAVENOUS | Status: AC
  Filled 2019-02-23: qty 20

## 2019-02-23 MED ORDER — OXYCODONE HCL 5 MG/5ML PO SOLN: 5.0000 mg | Freq: Once | ORAL | Status: DC | PRN

## 2019-02-23 MED ORDER — HYDROMORPHONE HCL 1 MG/ML IJ SOLN
0.2500 mg | INTRAMUSCULAR | Status: DC | PRN
  Administered 2019-02-23 (×2): 0.5 mg via INTRAVENOUS

## 2019-02-23 MED ORDER — ONDANSETRON HCL 4 MG/2ML IJ SOLN
INTRAMUSCULAR | Status: AC
  Filled 2019-02-23: qty 2

## 2019-02-23 MED ORDER — MIDAZOLAM HCL 2 MG/2ML IJ SOLN
INTRAMUSCULAR | Status: DC | PRN
  Administered 2019-02-23: 2 mg via INTRAVENOUS

## 2019-02-23 MED ORDER — KETOROLAC TROMETHAMINE 30 MG/ML IJ SOLN
INTRAMUSCULAR | Status: AC
  Filled 2019-02-23: qty 1

## 2019-02-23 MED ORDER — LACTATED RINGERS IV SOLN: INTRAVENOUS | Status: DC

## 2019-02-23 MED ORDER — LIDOCAINE 2% (20 MG/ML) 5 ML SYRINGE
INTRAMUSCULAR | Status: DC | PRN
  Administered 2019-02-23: 80 mg via INTRAVENOUS

## 2019-02-23 MED ORDER — HYDROMORPHONE HCL 1 MG/ML IJ SOLN
INTRAMUSCULAR | Status: AC
  Filled 2019-02-23: qty 1

## 2019-02-23 MED ORDER — ACETAMINOPHEN 500 MG PO TABS
1000.0000 mg | ORAL_TABLET | Freq: Once | ORAL | Status: AC
  Administered 2019-02-23: 1000 mg via ORAL
  Filled 2019-02-23: qty 2

## 2019-02-23 MED ORDER — DEXAMETHASONE SODIUM PHOSPHATE 10 MG/ML IJ SOLN
INTRAMUSCULAR | Status: AC
  Filled 2019-02-23: qty 1

## 2019-02-23 MED ORDER — CEPHALEXIN 500 MG PO CAPS: 500.0000 mg | ORAL_CAPSULE | Freq: Two times a day (BID) | ORAL | 0 refills | Status: DC

## 2019-02-23 SURGICAL SUPPLY — 27 items
BAG URO CATCHER STRL LF (MISCELLANEOUS) ×3 IMPLANT
BASKET LASER NITINOL 1.9FR (BASKET) ×2 IMPLANT
BSKT STON RTRVL 120 1.9FR (BASKET) ×1
CATH INTERMIT  6FR 70CM (CATHETERS) ×3 IMPLANT
CLOTH BEACON ORANGE TIMEOUT ST (SAFETY) ×3 IMPLANT
COVER SURGICAL LIGHT HANDLE (MISCELLANEOUS) ×3 IMPLANT
COVER WAND RF STERILE (DRAPES) IMPLANT
EXTRACTOR STONE 1.7FRX115CM (UROLOGICAL SUPPLIES) IMPLANT
FIBER LASER FLEXIVA 1000 (UROLOGICAL SUPPLIES) IMPLANT
FIBER LASER FLEXIVA 365 (UROLOGICAL SUPPLIES) IMPLANT
FIBER LASER FLEXIVA 550 (UROLOGICAL SUPPLIES) IMPLANT
FIBER LASER TRAC TIP (UROLOGICAL SUPPLIES) ×2 IMPLANT
GLOVE BIOGEL M STRL SZ7.5 (GLOVE) ×3 IMPLANT
GOWN STRL REUS W/TWL LRG LVL3 (GOWN DISPOSABLE) ×3 IMPLANT
GUIDEWIRE ANG ZIPWIRE 038X150 (WIRE) ×3 IMPLANT
GUIDEWIRE STR DUAL SENSOR (WIRE) ×3 IMPLANT
KIT TURNOVER KIT A (KITS) IMPLANT
MANIFOLD NEPTUNE II (INSTRUMENTS) ×3 IMPLANT
PACK CYSTO (CUSTOM PROCEDURE TRAY) ×3 IMPLANT
PENCIL SMOKE EVACUATOR (MISCELLANEOUS) IMPLANT
SHEATH URETERAL 12FRX28CM (UROLOGICAL SUPPLIES) IMPLANT
SHEATH URETERAL 12FRX35CM (MISCELLANEOUS) ×2 IMPLANT
STENT POLARIS 5FRX26 (STENTS) ×4 IMPLANT
TUBE FEEDING 8FR 16IN STR KANG (MISCELLANEOUS) ×3 IMPLANT
TUBING CONNECTING 10 (TUBING) ×2 IMPLANT
TUBING CONNECTING 10' (TUBING) ×1
TUBING UROLOGY SET (TUBING) ×3 IMPLANT

## 2019-02-23 NOTE — Transfer of Care (Signed)
Immediate Anesthesia Transfer of Care Note  Patient: Marcus Burns  Procedure(s) Performed: Procedure(s) with comments: CYSTOSCOPY WITH RETROGRADE PYELOGRAM, URETEROSCOPY AND STENT PLACEMENT (Bilateral) - 1 HR HOLMIUM LASER APPLICATION (Bilateral)  Patient Location: PACU  Anesthesia Type:General  Level of Consciousness: Alert, Awake, Oriented  Airway & Oxygen Therapy: Patient Spontanous Breathing  Post-op Assessment: Report given to RN  Post vital signs: Reviewed and stable  Last Vitals:  Vitals:   02/23/19 1411  BP: (!) 151/84  Pulse: 91  Resp: 18  Temp: 37.1 C  SpO2: 99%    Complications: No apparent anesthesia complications

## 2019-02-23 NOTE — Anesthesia Preprocedure Evaluation (Addendum)
Anesthesia Evaluation  Patient identified by MRN, date of birth, ID band Patient awake    Reviewed: Allergy & Precautions, NPO status , Patient's Chart, lab work & pertinent test results  Airway Mallampati: III  TM Distance: >3 FB Neck ROM: Full    Dental no notable dental hx.    Pulmonary former smoker,    Pulmonary exam normal breath sounds clear to auscultation       Cardiovascular negative cardio ROS Normal cardiovascular exam Rhythm:Regular Rate:Normal     Neuro/Psych  Headaches, Anxiety    GI/Hepatic negative GI ROS, Neg liver ROS,   Endo/Other  negative endocrine ROS  Renal/GU negative Renal ROS     Musculoskeletal negative musculoskeletal ROS (+)   Abdominal (+) + obese,   Peds  Hematology negative hematology ROS (+)   Anesthesia Other Findings BILATERAL RENAL STONES  Reproductive/Obstetrics                            Anesthesia Physical Anesthesia Plan  ASA: II  Anesthesia Plan: General   Post-op Pain Management:    Induction: Intravenous  PONV Risk Score and Plan: 3 and Midazolam, Dexamethasone, Ondansetron and Treatment may vary due to age or medical condition  Airway Management Planned: LMA  Additional Equipment:   Intra-op Plan:   Post-operative Plan: Extubation in OR  Informed Consent: I have reviewed the patients History and Physical, chart, labs and discussed the procedure including the risks, benefits and alternatives for the proposed anesthesia with the patient or authorized representative who has indicated his/her understanding and acceptance.     Dental advisory given  Plan Discussed with: CRNA  Anesthesia Plan Comments:        Anesthesia Quick Evaluation

## 2019-02-23 NOTE — Brief Op Note (Signed)
02/23/2019  5:38 PM  PATIENT:  Marcus Burns  39 y.o. male  PRE-OPERATIVE DIAGNOSIS:  BILATERAL RENAL STONES  POST-OPERATIVE DIAGNOSIS:  BILATERAL RENAL STONES  PROCEDURE:  Procedure(s) with comments: CYSTOSCOPY WITH RETROGRADE PYELOGRAM, URETEROSCOPY AND STENT PLACEMENT (Bilateral) - 1 HR HOLMIUM LASER APPLICATION (Bilateral)  SURGEON:  Surgeon(s) and Role:    Sebastian Ache, MD - Primary  PHYSICIAN ASSISTANT:   ASSISTANTS: none   ANESTHESIA:   general  EBL:  minimal   BLOOD ADMINISTERED:none  DRAINS: none   LOCAL MEDICATIONS USED:  NONE  SPECIMEN:  Source of Specimen:  bilateral renal / ureteral stone fragments  DISPOSITION OF SPECIMEN:  [facility] for compositional analysis  COUNTS:  YES  TOURNIQUET:  * No tourniquets in log *  DICTATION: .Other Dictation: Dictation Number [id number]  PLAN OF CARE: Discharge to home after PACU  PATIENT DISPOSITION:  PACU - hemodynamically stable.   Delay start of Pharmacological VTE agent (>24hrs) due to surgical blood loss or risk of bleeding: yes

## 2019-02-23 NOTE — Anesthesia Postprocedure Evaluation (Signed)
Anesthesia Post Note  Patient: Marcus Burns  Procedure(s) Performed: CYSTOSCOPY WITH RETROGRADE PYELOGRAM, URETEROSCOPY AND STENT PLACEMENT (Bilateral ) HOLMIUM LASER APPLICATION (Bilateral )     Patient location during evaluation: PACU Anesthesia Type: General Level of consciousness: awake and alert Pain management: pain level controlled Vital Signs Assessment: post-procedure vital signs reviewed and stable Respiratory status: spontaneous breathing, nonlabored ventilation, respiratory function stable and patient connected to nasal cannula oxygen Cardiovascular status: blood pressure returned to baseline and stable Postop Assessment: no apparent nausea or vomiting Anesthetic complications: no    Last Vitals:  Vitals:   02/23/19 1745 02/23/19 1800  BP: (!) 155/99 (!) 165/99  Pulse: (!) 102 91  Resp: 17 19  Temp: 36.8 C   SpO2: 99% 93%    Last Pain:  Vitals:   02/23/19 1800  TempSrc:   PainSc: 8                  Denia Mcvicar P Seneca Gadbois

## 2019-02-23 NOTE — H&P (Signed)
Marcus Burns is an 39 y.o. male.    Chief Complaint: Pre-op BILATERAL Ureteroscopic Stone Manipulation  HPI:    1 - Recurrent Urolithiasis -  Pre 2021 - medical passage x many   02/2019 - 103mm Rt mid stone (L4TP) by ER CT. Bilateral small renal stones as well (20mm x 4 each side). Not visible by KUB 02/06/19. ==> cysto bilateral stents 02/09/19 (ureters did not allow ureteroscopy due to small caliber)  PMH sig for obesity, LIH. His PCP is Marcus Barre MD. 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   Today " Marcus Burns " is seen to proceed with re-attempt BILATERAL ureteroscopy for Rt ureteral ant bilateral renal stones with goal of stone free. No interval fevers. Most recent UA without infectious parameters. He has now been pre-stented x 2 weeks.    Past Medical History:  Diagnosis Date  . Acute prostatitis 06/19/2007  . ALLERGIC RHINITIS 06/19/2007  . ANXIETY 06/19/2007  . ELEVATED BLOOD PRESSURE WITHOUT DIAGNOSIS OF HYPERTENSION 06/19/2007  . GERD 06/19/2007  . GERD (gastroesophageal reflux disease)   . History of COVID-19 01/24/2019  . History of kidney stones   . Inguinal hernia   . Migraines   . NEPHROLITHIASIS, HX OF 06/19/2007    Past Surgical History:  Procedure Laterality Date  . CYSTOSCOPY WITH RETROGRADE PYELOGRAM, URETEROSCOPY AND STENT PLACEMENT Bilateral 02/09/2019   Procedure: CYSTOSCOPY WITH RETROGRADE PYELOGRAM, DIAGNOSTIC URETEROSCOPY AND STENT PLACEMENT;  Surgeon: Marcus Ache, MD;  Location: [facility];  Service: Urology;  Laterality: Bilateral;  75 MINS  . HERNIA REPAIR      Family History  Problem Relation Age of Onset  . Coronary artery disease Father   . Anxiety disorder Mother   . Anxiety disorder Brother   . Diabetes Brother    Social History:  reports that he has quit smoking. He has quit using smokeless tobacco. He reports that he does not drink alcohol or use drugs.  Allergies:  Allergies  Allergen Reactions  . Doxycycline  Other (See Comments)    Light headed , passed out    Medications Prior to Admission  Medication Sig Dispense Refill  . APPLE CIDER VINEGAR PO Take 1 capsule by mouth daily.    . cetirizine (ZYRTEC) 10 MG tablet Take 10 mg by mouth daily as needed for allergies.    . Cholecalciferol (VITAMIN D) 50 MCG (2000 UT) tablet Take 2,000 Units by mouth daily.    . diphenhydrAMINE (BENADRYL) 25 MG tablet Take 50 mg by mouth daily as needed for allergies.    Marland Kitchen ibuprofen (ADVIL) 200 MG tablet Take 200 mg by mouth every 6 (six) hours as needed.    Marland Kitchen ketorolac (TORADOL) 10 MG tablet Take 10 mg by mouth every 6 (six) hours as needed for pain.    . Multiple Vitamin (MULTIVITAMIN WITH MINERALS) TABS tablet Take 1 tablet by mouth at bedtime.    . ondansetron (ZOFRAN ODT) 8 MG disintegrating tablet Take 1 tablet (8 mg total) by mouth every 8 (eight) hours as needed for nausea or vomiting. 12 tablet 0  . oxyCODONE-acetaminophen (PERCOCET/ROXICET) 5-325 MG tablet Take 1-2 tablets by mouth every 6 (six) hours as needed for severe pain. Post-operatively 10 tablet 0  . tamsulosin (FLOMAX) 0.4 MG CAPS capsule Take 1 capsule (0.4 mg total) by mouth daily. 7 capsule 0    No results found for this or any previous visit (from the past 48 hour(s)). No results found.  Review of Systems  Genitourinary: Positive for frequency, hematuria and urgency.  All other systems reviewed and are negative.   Blood pressure (!) 151/84, pulse 91, temperature 98.8 F (37.1 C), temperature source Oral, resp. rate 18, SpO2 99 %. Physical Exam  Constitutional: He appears well-developed.  Very pleasant.   HENT:  Head: Normocephalic.  Cardiovascular: Normal rate.  Respiratory: Effort normal.  GI:  Stable truncal obesity.   Genitourinary:    Genitourinary Comments: NO CVAT at present.    Musculoskeletal:        General: Normal range of motion.     Cervical back: Normal range of motion.  Neurological: He is alert.  Skin: Skin is  warm.  Psychiatric: He has a normal mood and affect.     Assessment/Plan  Proceed as planned with BILATERAL ureteroscopic stone maniulation with goal of stone free. Risks, benefis, alternatives, expected peri-op course discussed previously and reiterated today.   Marcus Frock, MD 02/23/2019, 2:45 PM

## 2019-02-23 NOTE — Discharge Instructions (Signed)
1 - You may have urinary urgency (bladder spasms) and bloody urine on / off with stent in place. This is normal. ° °2 - Call MD or go to ER for fever >102, severe pain / nausea / vomiting not relieved by medications, or acute change in medical status ° °

## 2019-02-24 NOTE — Op Note (Signed)
NAME: Marcus Burns, MAIN MEDICAL RECORD [id number] ACCOUNT [id number] DATE OF BIRTH:10-01-1980 FACILITY: [facility] LOCATION: [facility] PHYSICIAN:Kassi Esteve Berneice Heinrich, MD  OPERATIVE REPORT  DATE OF PROCEDURE:  02/23/2019  SURGEON:  Sebastian Ache, MD  PREOPERATIVE DIAGNOSES:   1.  Bilateral renal stones.  2.  History of recurrent urolithiasis.  PROCEDURE: 1.  Cystoscopy, bilateral retrograde pyelograms, interpretation. 2.  Bilateral ureteroscopy, laser lithotripsy. 3.  Exchange of bilateral ureteral stents, 5 x 26 Polaris, no tether.  ESTIMATED BLOOD LOSS:  Nil.  COMPLICATIONS:  None.  SPECIMENS:  Right ureteral and bilateral renal stone fragments for composition analysis.  FINDINGS: 1.  Impacted right proximal ureteral stone. 2.  Bilateral papillary tip calcifications. 3.  Successful removal of all accessible stone fragments larger than one-third mm bilaterally following laser lithotripsy and basket extraction. 4.  Successful replacement of bilateral ureteral stents, proximal end in the renal pelvis, distal end in urinary bladder.  INDICATIONS:  The patient is a very pleasant, but unfortunate man with history of recurrent urolithiasis.  He has a right proximal ureteral stone, quite large and bilateral renal stones.  He underwent attempted bilateral ureteroscopic stimulation on  02/09/2019, however, bilateral ureters were of quite narrow caliber, not allowing ureteroscopic visualization of any of the stones.  Therefore, he underwent stenting to allow for passive dilation and repeat attempt, which he presents for today.  Informed  consent was obtained and placed in the medical record.  DESCRIPTION OF PROCEDURE:  The patient being identified, the procedure being bilateral ureteroscopic stimulation was confirmed.  Procedure timeout was performed.  Intravenous antibiotics were administered.  General anesthesia induced.  The patient was  placed into a low lithotomy position.  Sterile field  was created, prepping and draping base of the penis, perineum and proximal thighs using iodine.  Cystourethroscopy was performed using 21-French rigid cystoscope with offset lens.  Inspection of  bladder revealed distal end of bilateral ureteral stents in situ.  They were grasped, brought to the level of the urethral meatus and a ZIPwire was advanced up the right ureter, exchanged for an open-ended catheter and right retrograde pyelogram was  obtained.  Right retrograde pyelogram demonstrated a single right ureter and single system right kidney, filling defect in the proximal ureter consistent with known stone.  A ZIPwire was once again advanced and set aside as a safety wire.  Similarly, the distal end  of the left stent was removed.  A ZIPwire was advanced to the left kidney, exchanged for an open-ended catheter and left retrograde pyelogram was obtained.  Left retrograde pyelogram demonstrated a single left ureter, single system left kidney.  No filling defects or narrowing noted.  A ZIPwire was once again advanced and set aside on the left side as a safety wire.  An 8-French feeding tube was placed in  the urinary bladder for pressure release and semirigid ureteroscopy was performed of the right ureter alongside a separate sensor working wire.  Inspection of the distal two-thirds of the ureter was performed.  No mucosal abnormalities or calcifications  were noted.  Similarly, a semi-rigid ureteroscopy was performed of the distal two-thirds of the left ureter alongside a separate sensor working wire.  No mucosal abnormalities were found and the semirigid scope was exchanged for a 12/14, 35 cm ureteral  access sheath to the level of the proximal left ureter using continuous fluoroscopic guidance and flexible digital ureteroscopy performed to the proximal left ureter and systematic inspection of the left kidney, including all calices x3.  There were  multifocal papillary tip calcifications, the vast  majority of which were amenable to simple basketing.  They were grasped with the Escape basket, removed and set aside for composition analysis.  There was a dominant calcification of lower mid pole that  did require laser lithotripsy, setting of 0.2 joules and 20 Hz.  It was fragmented into approximately 2 smaller pieces, which were then removed.  Following this, complete resolution of all accessible stone fragments larger than one-third mm on the left  side, the access sheath was removed under continuous vision.  No significant mucosal abnormalities were found.  Similarly, the access sheath was placed over the right sensor working wire to the level of the proximal right ureter.  Using continuous  fluoroscopic guidance and flexible digital ureteroscopy was performed of the proximal ureter and systematic inspection of the right kidney. In the proximal right ureter, a quite impacted calcification was noted.  This was much too large for simple  basketing.  Holmium laser energy applied at 70 setting of 0.2 joules and 20 Hz and approximately 60% of the stone volume was dusted.  The remaining portion fragmented and pieces removed and set aside for composition analysis.  Inspection of the right  kidney revealed multifocal papillary tip calcifications, the vast majority of which were amenable to simple basketing.  An upper pole dominant papillary tip calcification also require laser lithotripsy.  Following this, with complete resolution of all  accessible stone fragments larger than one-third mm within the right kidney and ureter, the access sheath was removed under continuous vision.  Again, there was significant mucosal edema at the site of prior stone impaction.   It was felt that interval  stenting with nontethered stent would be most advantageous.  As such, new 5 x 26 Polaris-type stents were placed with remaining safety wires using fluoroscopic guidance.  Good proximal and distal plane were noted.  Anesthesia  was terminated.  The patient  tolerated the procedure well.  No immediate perioperative complications.  The patient was taken to postanesthesia care unit in stable condition.  Plan for discharge home.  VN/NUANCE  D:02/23/2019 T:02/24/2019 JOB:[id number]

## 2019-02-26 ENCOUNTER — Encounter: Payer: Self-pay | Admitting: *Deleted

## 2019-04-09 ENCOUNTER — Other Ambulatory Visit: Payer: Self-pay | Admitting: Internal Medicine

## 2019-04-10 NOTE — Telephone Encounter (Signed)
Done erx 

## 2019-08-03 ENCOUNTER — Encounter: Payer: Self-pay | Admitting: Internal Medicine

## 2019-08-03 MED ORDER — ALPRAZOLAM 0.5 MG PO TABS: ORAL_TABLET | ORAL | 2 refills | Status: DC

## 2019-09-21 ENCOUNTER — Encounter: Payer: No Typology Code available for payment source | Admitting: Internal Medicine

## 2019-09-21 DIAGNOSIS — Z0289 Encounter for other administrative examinations: Secondary | ICD-10-CM

## 2019-10-17 ENCOUNTER — Encounter: Payer: Self-pay | Admitting: Internal Medicine

## 2019-10-17 ENCOUNTER — Other Ambulatory Visit: Payer: Self-pay

## 2019-10-17 ENCOUNTER — Ambulatory Visit (INDEPENDENT_AMBULATORY_CARE_PROVIDER_SITE_OTHER): Payer: No Typology Code available for payment source | Admitting: Internal Medicine

## 2019-10-17 VITALS — BP 140/90 | HR 88 | Temp 99.0°F | Ht 76.0 in | Wt 331.0 lb

## 2019-10-17 DIAGNOSIS — Z0000 Encounter for general adult medical examination without abnormal findings: Secondary | ICD-10-CM

## 2019-10-17 DIAGNOSIS — R739 Hyperglycemia, unspecified: Secondary | ICD-10-CM

## 2019-10-17 DIAGNOSIS — E785 Hyperlipidemia, unspecified: Secondary | ICD-10-CM

## 2019-10-17 DIAGNOSIS — E559 Vitamin D deficiency, unspecified: Secondary | ICD-10-CM | POA: Insufficient documentation

## 2019-10-17 DIAGNOSIS — F411 Generalized anxiety disorder: Secondary | ICD-10-CM | POA: Diagnosis not present

## 2019-10-17 DIAGNOSIS — Z0001 Encounter for general adult medical examination with abnormal findings: Secondary | ICD-10-CM

## 2019-10-17 DIAGNOSIS — Z1159 Encounter for screening for other viral diseases: Secondary | ICD-10-CM | POA: Diagnosis not present

## 2019-10-17 DIAGNOSIS — G4700 Insomnia, unspecified: Secondary | ICD-10-CM | POA: Insufficient documentation

## 2019-10-17 LAB — BASIC METABOLIC PANEL
BUN: 11 mg/dL (ref 6–23)
CO2: 29 mEq/L (ref 19–32)
Calcium: 9.7 mg/dL (ref 8.4–10.5)
Chloride: 103 mEq/L (ref 96–112)
Creatinine, Ser: 1.04 mg/dL (ref 0.40–1.50)
GFR: 89.12 mL/min (ref >60.00)
Glucose, Bld: 96 mg/dL (ref 70–99)
Potassium: 4.3 mEq/L (ref 3.5–5.1)
Sodium: 140 mEq/L (ref 135–145)

## 2019-10-17 LAB — CBC WITH DIFFERENTIAL/PLATELET
Basophils Absolute: 0 10*3/uL (ref 0.0–0.1)
Basophils Relative: 0.4 % (ref 0.0–3.0)
Eosinophils Absolute: 0.2 10*3/uL (ref 0.0–0.7)
Eosinophils Relative: 3.3 % (ref 0.0–5.0)
HCT: 40.3 % (ref 39.0–52.0)
Hemoglobin: 13.7 g/dL (ref 13.0–17.0)
Lymphocytes Relative: 33 % (ref 12.0–46.0)
Lymphs Abs: 1.8 10*3/uL (ref 0.7–4.0)
MCHC: 33.9 g/dL (ref 30.0–36.0)
MCV: 87.1 fl (ref 78.0–100.0)
Monocytes Absolute: 0.4 10*3/uL (ref 0.1–1.0)
Monocytes Relative: 6.8 % (ref 3.0–12.0)
Neutro Abs: 3 10*3/uL (ref 1.4–7.7)
Neutrophils Relative %: 56.5 % (ref 43.0–77.0)
Platelets: 284 10*3/uL (ref 150.0–400.0)
RBC: 4.63 Mil/uL (ref 4.22–5.81)
RDW: 15 % (ref 11.5–15.5)
WBC: 5.3 10*3/uL (ref 4.0–10.5)

## 2019-10-17 LAB — URINALYSIS, ROUTINE W REFLEX MICROSCOPIC
Bilirubin Urine: NEGATIVE
Hgb urine dipstick: NEGATIVE
Ketones, ur: NEGATIVE
Leukocytes,Ua: NEGATIVE
Nitrite: NEGATIVE
RBC / HPF: NONE SEEN (ref 0–?)
Specific Gravity, Urine: 1.025 (ref 1.000–1.030)
Total Protein, Urine: NEGATIVE
Urine Glucose: NEGATIVE
Urobilinogen, UA: 0.2 (ref 0.0–1.0)
pH: 6 (ref 5.0–8.0)

## 2019-10-17 LAB — HEPATIC FUNCTION PANEL
ALT: 19 U/L (ref 0–53)
AST: 14 U/L (ref 0–37)
Albumin: 4.3 g/dL (ref 3.5–5.2)
Alkaline Phosphatase: 77 U/L (ref 39–117)
Bilirubin, Direct: 0.1 mg/dL (ref 0.0–0.3)
Total Bilirubin: 0.7 mg/dL (ref 0.2–1.2)
Total Protein: 7.6 g/dL (ref 6.0–8.3)

## 2019-10-17 LAB — LDL CHOLESTEROL, DIRECT: Direct LDL: 121 mg/dL

## 2019-10-17 LAB — LIPID PANEL
Cholesterol: 217 mg/dL — ABNORMAL HIGH (ref 0–200)
HDL: 37.4 mg/dL — ABNORMAL LOW (ref >39.00)
NonHDL: 179.11
Total CHOL/HDL Ratio: 6
Triglycerides: 306 mg/dL — ABNORMAL HIGH (ref 0.0–149.0)
VLDL: 61.2 mg/dL — ABNORMAL HIGH (ref 0.0–40.0)

## 2019-10-17 LAB — MICROALBUMIN / CREATININE URINE RATIO
Creatinine,U: 88.3 mg/dL
Microalb Creat Ratio: 0.8 mg/g (ref 0.0–30.0)
Microalb, Ur: <0.7 mg/dL (ref 0.0–1.9)

## 2019-10-17 LAB — TSH: TSH: 1.78 u[IU]/mL (ref 0.35–4.50)

## 2019-10-17 LAB — HEMOGLOBIN A1C: Hgb A1c MFr Bld: 6.2 % (ref 4.6–6.5)

## 2019-10-17 LAB — PSA: PSA: 0.68 ng/mL (ref 0.10–4.00)

## 2019-10-17 MED ORDER — CITALOPRAM HYDROBROMIDE 20 MG PO TABS: 20.0000 mg | ORAL_TABLET | Freq: Every day | ORAL | 3 refills | Status: DC

## 2019-10-17 MED ORDER — ALPRAZOLAM 0.5 MG PO TABS: ORAL_TABLET | ORAL | 2 refills | Status: DC

## 2019-10-17 NOTE — Patient Instructions (Addendum)
Please remember to have your booster shot around Dec 09, 2019  Please take all new medication as prescribed - the celexa 20 mg per day  Please continue all other medications as before, and refills have been done if requested - the xanax  Please have the pharmacy call with any other refills you may need.  Please continue your efforts at being more active, low cholesterol diet, and weight control.  You are otherwise up to date with prevention measures today.  Please keep your appointments with your specialists as you may have planned  Please go to the LAB at the blood drawing area for the tests to be done  You will be contacted by phone if any changes need to be made immediately.  Otherwise, you will receive a letter about your results with an explanation, but please check with MyChart first.  Please remember to sign up for MyChart if you have not done so, as this will be important to you in the future with finding out test results, communicating by private email, and scheduling acute appointments online when needed.  Please make an Appointment to return for your 1 year visit, or sooner if needed

## 2019-10-17 NOTE — Progress Notes (Signed)
Subjective:    Patient ID: Marcus Burns, male    DOB: 10/16/1980, 39 y.o.   MRN: [id number]  HPI  Here for wellness and f/u;  Overall doing ok;  Pt denies Chest pain, worsening SOB, DOE, wheezing, orthopnea, PND, worsening LE edema, palpitations, dizziness or syncope.  Pt denies neurological change such as new headache, facial or extremity weakness.  Pt denies polydipsia, polyuria, or low sugar symptoms. Pt states overall good compliance with treatment and medications, good tolerability, and has been trying to follow appropriate diet.  Pt denies worsening depressive symptoms, suicidal ideation or panic. No fever, night sweats, wt loss, loss of appetite, or other constitutional symptoms.  Pt states good ability with ADL's, has low fall risk, home safety reviewed and adequate, no other significant changes in hearing or vision, and only occasionally active with exercise. 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  Asks for the xanax refill.  Open to daily SSRI.  Did see urology about stones and asked to drink more fluids and crystal light for citric acid.  Did rgain about 18 lbs in the past yr.  Back to walking every day.  Taking dialy Vit D.  Has recent worsening insomnia with getting to sleep most nights Wt Readings from Last 3 Encounters:  10/17/19 (!) 331 lb (150.1 kg)  02/09/19 (!) 321 lb (145.6 kg)  09/15/18 (!) 303 lb (137.4 kg)   Past Medical History:  Diagnosis Date  . Acute prostatitis 06/19/2007  . ALLERGIC RHINITIS 06/19/2007  . ANXIETY 06/19/2007  . ELEVATED BLOOD PRESSURE WITHOUT DIAGNOSIS OF HYPERTENSION 06/19/2007  . GERD 06/19/2007  . GERD (gastroesophageal reflux disease)   . History of COVID-19 01/24/2019  . History of kidney stones   . Inguinal hernia   . Migraines   . NEPHROLITHIASIS, HX OF 06/19/2007   Past Surgical History:    Procedure Laterality Date  . CYSTOSCOPY WITH RETROGRADE PYELOGRAM, URETEROSCOPY AND STENT PLACEMENT Bilateral 02/09/2019   Procedure: CYSTOSCOPY WITH RETROGRADE PYELOGRAM, DIAGNOSTIC URETEROSCOPY AND STENT PLACEMENT;  Surgeon: Sebastian Ache, MD;  Location: [facility];  Service: Urology;  Laterality: Bilateral;  75 MINS  . CYSTOSCOPY WITH RETROGRADE PYELOGRAM, URETEROSCOPY AND STENT PLACEMENT Bilateral 02/23/2019   Procedure: CYSTOSCOPY WITH RETROGRADE PYELOGRAM, URETEROSCOPY AND STENT PLACEMENT;  Surgeon: Sebastian Ache, MD;  Location: [facility];  Service: Urology;  Laterality: Bilateral;  1 HR  . HERNIA REPAIR    . HOLMIUM LASER APPLICATION Bilateral 02/23/2019   Procedure: HOLMIUM LASER APPLICATION;  Surgeon: Sebastian Ache, MD;  Location: [facility];  Service: Urology;  Laterality: Bilateral;    reports that he has quit smoking. He has quit using smokeless tobacco. He reports that he does not drink alcohol and does not use drugs. family history includes Anxiety disorder in his brother and mother; Coronary artery disease in his father; Diabetes in his brother. Allergies  Allergen Reactions  . Doxycycline Other (See Comments)    Light headed , passed out   Current Outpatient Medications on File Prior to Visit  Medication Sig Dispense Refill  . APPLE CIDER VINEGAR PO Take 1 capsule by mouth daily.    . cetirizine (ZYRTEC) 10 MG tablet Take 10 mg by mouth daily as needed for allergies.    . Cholecalciferol (VITAMIN D) 50 MCG (2000 UT) tablet Take 2,000 Units by mouth daily.    Marland Kitchen  diphenhydrAMINE (BENADRYL) 25 MG tablet Take 50 mg by mouth daily as needed for allergies.    . Multiple Vitamin (MULTIVITAMIN WITH MINERALS) TABS tablet Take 1 tablet by mouth at bedtime.    . ondansetron (ZOFRAN ODT) 8 MG disintegrating tablet Take 1 tablet (8 mg total) by mouth every 8 (eight) hours as needed for nausea or vomiting. 12 tablet 0  . tamsulosin (FLOMAX) 0.4 MG CAPS capsule Take 1 capsule (0.4 mg total) by mouth  daily. 7 capsule 0  . [DISCONTINUED] triamcinolone (NASACORT) 55 MCG/ACT AERO nasal inhaler Place 2 sprays into the nose daily. (Patient not taking: Reported on 02/01/2019) 1 Inhaler 12   No current facility-administered medications on file prior to visit.   Review of Systems All otherwise neg per pt     Objective:   Physical Exam BP 140/90 (BP Location: Left Arm, Patient Position: Sitting, Cuff Size: Large)   Pulse 88   Temp 99 F (37.2 C) (Oral)   Ht 6\' 4"  (1.93 m)   Wt (!) 331 lb (150.1 kg)   SpO2 98%   BMI 40.29 kg/m  VS noted,  Constitutional: Pt appears in NAD HENT: Head: NCAT.  Right Ear: External ear normal.  Left Ear: External ear normal.  Eyes: . Pupils are equal, round, and reactive to light. Conjunctivae and EOM are normal Nose: without d/c or deformity Neck: Neck supple. Gross normal ROM Cardiovascular: Normal rate and regular rhythm.   Pulmonary/Chest: Effort normal and breath sounds without rales or wheezing.  Abd:  Soft, NT, ND, + BS, no organomegaly Neurological: Pt is alert. At baseline orientation, motor grossly intact Skin: Skin is warm. No rashes, other new lesions, no LE edema Psychiatric: Pt behavior is normal without agitation , 2+ nervous All otherwise neg per pt Lab Results  Component Value Date   WBC 5.3 10/17/2019   HGB 13.7 10/17/2019   HCT 40.3 10/17/2019   PLT 284.0 10/17/2019   GLUCOSE 96 10/17/2019   CHOL 217 (H) 10/17/2019   TRIG 306.0 (H) 10/17/2019   HDL 37.40 (L) 10/17/2019   LDLDIRECT 121.0 10/17/2019   LDLCALC 111 (H) 09/15/2018   ALT 19 10/17/2019   AST 14 10/17/2019   NA 140 10/17/2019   K 4.3 10/17/2019   CL 103 10/17/2019   CREATININE 1.04 10/17/2019   BUN 11 10/17/2019   CO2 29 10/17/2019   TSH 1.78 10/17/2019   PSA 0.68 10/17/2019   HGBA1C 6.2 10/17/2019   MICROALBUR <0.7 10/17/2019      Assessment & Plan:

## 2019-10-18 LAB — HEPATITIS C ANTIBODY
Hepatitis C Ab: NONREACTIVE
SIGNAL TO CUT-OFF: 0.13 (ref <1.00)

## 2019-10-24 ENCOUNTER — Encounter: Payer: Self-pay | Admitting: Internal Medicine

## 2019-10-24 NOTE — Assessment & Plan Note (Signed)
Cont xanax prn 

## 2019-10-24 NOTE — Assessment & Plan Note (Signed)
For vit d 2000 u qd 

## 2019-10-24 NOTE — Assessment & Plan Note (Signed)
Declines statin, for low chol diet 

## 2019-10-24 NOTE — Assessment & Plan Note (Addendum)
For xanax prn  I spent 31 minutes in preparing to see the patient by review of recent labs, imaging and procedures, obtaining and reviewing separately obtained history, communicating with the patient and family or caregiver, ordering medications, tests or procedures, and documenting clinical information in the EHR including the differential Dx, treatment, and any further evaluation and other management of anxiety, hyperglycmiea, hld, vit d def, insomnia

## 2019-10-24 NOTE — Assessment & Plan Note (Signed)

## 2019-10-24 NOTE — Assessment & Plan Note (Signed)
stable overall by history and exam, recent data reviewed with pt, and pt to continue medical treatment as before,  to f/u any worsening symptoms or concerns  

## 2020-07-29 ENCOUNTER — Ambulatory Visit: Payer: No Typology Code available for payment source | Admitting: Internal Medicine

## 2020-10-22 ENCOUNTER — Encounter: Payer: No Typology Code available for payment source | Admitting: Internal Medicine

## 2020-12-19 ENCOUNTER — Encounter: Payer: No Typology Code available for payment source | Admitting: Internal Medicine

## 2020-12-22 ENCOUNTER — Encounter (HOSPITAL_BASED_OUTPATIENT_CLINIC_OR_DEPARTMENT_OTHER): Payer: Self-pay

## 2020-12-22 ENCOUNTER — Other Ambulatory Visit: Payer: Self-pay

## 2020-12-22 DIAGNOSIS — I10 Essential (primary) hypertension: Secondary | ICD-10-CM | POA: Insufficient documentation

## 2020-12-22 DIAGNOSIS — M5412 Radiculopathy, cervical region: Secondary | ICD-10-CM | POA: Diagnosis not present

## 2020-12-22 DIAGNOSIS — M25512 Pain in left shoulder: Secondary | ICD-10-CM | POA: Diagnosis not present

## 2020-12-22 DIAGNOSIS — Z8616 Personal history of COVID-19: Secondary | ICD-10-CM | POA: Insufficient documentation

## 2020-12-22 DIAGNOSIS — M542 Cervicalgia: Secondary | ICD-10-CM | POA: Diagnosis present

## 2020-12-22 DIAGNOSIS — M791 Myalgia, unspecified site: Secondary | ICD-10-CM | POA: Insufficient documentation

## 2020-12-22 DIAGNOSIS — Z87891 Personal history of nicotine dependence: Secondary | ICD-10-CM | POA: Insufficient documentation

## 2020-12-22 NOTE — ED Triage Notes (Signed)
Patient here POV from Home with Shoulder Pain.  Pain began over 1 week PTA with Acute Onset. Patient visited Orthopedic MD who gave Steroid Injection but Patient has had no Relief Since.   NAD Noted during Triage. A&Ox4. GCS 15. Ambulatory.

## 2020-12-23 ENCOUNTER — Emergency Department (HOSPITAL_BASED_OUTPATIENT_CLINIC_OR_DEPARTMENT_OTHER)
Admission: EM | Admit: 2020-12-23 | Discharge: 2020-12-23 | Disposition: A | Discharge status: Home / Self Care | Payer: 59 | Attending: Emergency Medicine | Admitting: Emergency Medicine

## 2020-12-23 DIAGNOSIS — M5412 Radiculopathy, cervical region: Secondary | ICD-10-CM

## 2020-12-23 MED ORDER — KETOROLAC TROMETHAMINE 15 MG/ML IJ SOLN
15.0000 mg | Freq: Once | INTRAMUSCULAR | Status: DC
  Filled 2020-12-23: qty 1

## 2020-12-23 MED ORDER — DICLOFENAC SODIUM 1 % EX GEL: 4.0000 g | Freq: Four times a day (QID) | CUTANEOUS | 0 refills | Status: DC

## 2020-12-23 MED ORDER — OXYCODONE HCL 5 MG PO TABS
5.0000 mg | ORAL_TABLET | Freq: Once | ORAL | Status: AC
  Administered 2020-12-23: 01:00:00 5 mg via ORAL
  Filled 2020-12-23: qty 1

## 2020-12-23 MED ORDER — DIAZEPAM 5 MG PO TABS
5.0000 mg | ORAL_TABLET | Freq: Once | ORAL | Status: AC
  Administered 2020-12-23: 01:00:00 5 mg via ORAL
  Filled 2020-12-23: qty 1

## 2020-12-23 MED ORDER — KETOROLAC TROMETHAMINE 15 MG/ML IJ SOLN
15.0000 mg | Freq: Once | INTRAMUSCULAR | Status: AC
  Administered 2020-12-23: 01:00:00 15 mg via INTRAMUSCULAR

## 2020-12-23 MED ORDER — ACETAMINOPHEN 500 MG PO TABS
1000.0000 mg | ORAL_TABLET | Freq: Once | ORAL | Status: AC
  Administered 2020-12-23: 01:00:00 1000 mg via ORAL
  Filled 2020-12-23: qty 2

## 2020-12-23 MED ORDER — METHYLPREDNISOLONE 4 MG PO TBPK: ORAL_TABLET | ORAL | 0 refills | Status: DC

## 2020-12-23 NOTE — ED Provider Notes (Signed)
[facility] EMERGENCY DEPT Provider Note   CSN: [id number] Arrival date & time: 12/22/20  1823     History Chief Complaint  Patient presents with   Shoulder Pain    Marcus Burns is a 40 y.o. male.  40 yo M with a chief complaints of left shoulder pain.  This been going on for couple weeks now.  Patient has been seen now twice by an orthopedic surgeon and had a plain film of his left shoulder and neck.  He was treated with a cortisone injection but has had no improvement.  Feels like he is not been able to sleep because he wakes up with pain and then cannot go back to sleep.  He denies trauma to the area.  Pain is worse with certain range of motion of the left shoulder.  He feels like he is experiencing some numbness and tingling to the left hand.  The history is provided by the patient.  Shoulder Pain Associated symptoms: neck pain   Associated symptoms: no fever   Illness Severity:  Moderate Onset quality:  Gradual Duration:  2 weeks Timing:  Constant Progression:  Worsening Chronicity:  New Associated symptoms: myalgias   Associated symptoms: no abdominal pain, no chest pain, no congestion, no diarrhea, no fever, no headaches, no rash, no shortness of breath and no vomiting       Past Medical History:  Diagnosis Date   Acute prostatitis 06/19/2007   ALLERGIC RHINITIS 06/19/2007   ANXIETY 06/19/2007   ELEVATED BLOOD PRESSURE WITHOUT DIAGNOSIS OF HYPERTENSION 06/19/2007   GERD 06/19/2007   GERD (gastroesophageal reflux disease)    History of COVID-19 01/24/2019   History of kidney stones    Inguinal hernia    Migraines    NEPHROLITHIASIS, HX OF 06/19/2007    Patient Active Problem List   Diagnosis Date Noted   Vitamin D deficiency 10/17/2019   Insomnia 10/17/2019   Gallstones 09/15/2018   Renal stones 09/15/2018   Back pain 05/14/2017   Hyperlipidemia 11/19/2014   Hyperglycemia 11/19/2014   Scrotal mass 01/14/2012   Obesity 10/21/2010    Encounter for well adult exam with abnormal findings 10/18/2010   Anxiety state 06/19/2007   Allergic rhinitis 06/19/2007   GERD 06/19/2007   ELEVATED BLOOD PRESSURE WITHOUT DIAGNOSIS OF HYPERTENSION 06/19/2007   NEPHROLITHIASIS, HX OF 06/19/2007    Past Surgical History:  Procedure Laterality Date   CYSTOSCOPY WITH RETROGRADE PYELOGRAM, URETEROSCOPY AND STENT PLACEMENT Bilateral 02/09/2019   Procedure: CYSTOSCOPY WITH RETROGRADE PYELOGRAM, DIAGNOSTIC URETEROSCOPY AND STENT PLACEMENT;  Surgeon: Sebastian Ache, MD;  Location: [facility];  Service: Urology;  Laterality: Bilateral;  75 MINS   CYSTOSCOPY WITH RETROGRADE PYELOGRAM, URETEROSCOPY AND STENT PLACEMENT Bilateral 02/23/2019   Procedure: CYSTOSCOPY WITH RETROGRADE PYELOGRAM, URETEROSCOPY AND STENT PLACEMENT;  Surgeon: Sebastian Ache, MD;  Location: [facility];  Service: Urology;  Laterality: Bilateral;  1 HR   HERNIA REPAIR     HOLMIUM LASER APPLICATION Bilateral 02/23/2019   Procedure: HOLMIUM LASER APPLICATION;  Surgeon: Sebastian Ache, MD;  Location: [facility];  Service: Urology;  Laterality: Bilateral;       Family History  Problem Relation Age of Onset   Coronary artery disease Father    Anxiety disorder Mother    Anxiety disorder Brother    Diabetes Brother     Social History   Tobacco Use   Smoking status: Former   Smokeless tobacco: Former  Building services engineer Use: Never used  Substance Use Topics  Alcohol use: No   Drug use: No    Home Medications Prior to Admission medications   Medication Sig Start Date End Date Taking? Authorizing Provider  diclofenac Sodium (VOLTAREN) 1 % GEL Apply 4 g topically 4 (four) times daily. 12/23/20  Yes Melene Plan, DO  methylPREDNISolone (MEDROL DOSEPAK) 4 MG TBPK tablet Day 1: 8mg  before breakfast, 4 mg after lunch, 4 mg after supper, and 8 mg at bedtime Day 2: 4 mg before breakfast, 4 mg after lunch, 4 mg  after supper, and 8 mg  at bedtime Day 3:  4 mg  before breakfast, 4 mg  after  lunch, 4 mg after supper, and 4 mg  at bedtime Day 4: 4 mg  before breakfast, 4 mg  after lunch, and 4 mg at bedtime Day 5: 4 mg  before breakfast and 4 mg at bedtime Day 6: 4 mg  before breakfast 12/23/20  Yes 12/25/20, DO  ALPRAZolam Melene Plan) 0.5 MG tablet TAKE 1 TABLET(0.5 MG) BY MOUTH AT BEDTIME AS NEEDED FOR SLEEP OR ANXIETY 10/17/19   10/19/19, MD  APPLE CIDER VINEGAR PO Take 1 capsule by mouth daily.    [provider]  cetirizine (ZYRTEC) 10 MG tablet Take 10 mg by mouth daily as needed for allergies.    [provider]  Cholecalciferol (VITAMIN D) 50 MCG (2000 UT) tablet Take 2,000 Units by mouth daily.    [provider]  citalopram (CELEXA) 20 MG tablet Take 1 tablet (20 mg total) by mouth daily. 10/17/19   10/19/19, MD  diphenhydrAMINE (BENADRYL) 25 MG tablet Take 50 mg by mouth daily as needed for allergies.    [provider]  Multiple Vitamin (MULTIVITAMIN WITH MINERALS) TABS tablet Take 1 tablet by mouth at bedtime.    [provider]  ondansetron (ZOFRAN ODT) 8 MG disintegrating tablet Take 1 tablet (8 mg total) by mouth every 8 (eight) hours as needed for nausea or vomiting. 02/01/19   02/03/19, MD  tamsulosin (FLOMAX) 0.4 MG CAPS capsule Take 1 capsule (0.4 mg total) by mouth daily. 02/01/19   02/03/19, MD  triamcinolone (NASACORT) 55 MCG/ACT AERO nasal inhaler Place 2 sprays into the nose daily. Patient not taking: Reported on 02/01/2019 05/13/17 02/01/19  02/03/19, MD    Allergies    Doxycycline  Review of Systems   Review of Systems  Constitutional:  Negative for chills and fever.  HENT:  Negative for congestion and facial swelling.   Eyes:  Negative for discharge and visual disturbance.  Respiratory:  Negative for shortness of breath.   Cardiovascular:  Negative for chest pain and palpitations.  Gastrointestinal:  Negative for abdominal pain, diarrhea and vomiting.  Musculoskeletal:  Positive for arthralgias,  myalgias and neck pain.  Skin:  Negative for color change and rash.  Neurological:  Negative for tremors, syncope and headaches.  Psychiatric/Behavioral:  Negative for confusion and dysphoric mood.    Physical Exam Updated Vital Signs BP (!) 140/92 (BP Location: Right Arm)    Pulse 92    Temp 98.3 F (36.8 C) (Oral)    Resp 18    Ht 6\' 4"  (1.93 m)    Wt (!) 140.6 kg    SpO2 100%    BMI 37.73 kg/m   Physical Exam Vitals and nursing note reviewed.  Constitutional:      Appearance: He is well-developed.  HENT:     Head: Normocephalic and atraumatic.  Eyes:  Pupils: Pupils are equal, round, and reactive to light.  Neck:     Vascular: No JVD.  Cardiovascular:     Rate and Rhythm: Normal rate and regular rhythm.     Heart sounds: No murmur heard.   No friction rub. No gallop.  Pulmonary:     Effort: No respiratory distress.     Breath sounds: No wheezing.  Abdominal:     General: There is no distension.     Tenderness: There is no abdominal tenderness. There is no guarding or rebound.  Musculoskeletal:        General: Tenderness present. Normal range of motion.     Cervical back: Normal range of motion and neck supple.     Comments: Pain to the trapezius on the left with some mild spasm.  Pain with range of motion of the left shoulder.  Negative Spurling's.  Pulse intact to the left upper extremity.  Motor intact when patient is not being asked to do it actively.  When actively asked he has some subjective difficulty performing the task.  Subjective decrease sensation along the medial aspect of the hand.  Skin:    Coloration: Skin is not pale.     Findings: No rash.  Neurological:     Mental Status: He is alert and oriented to person, place, and time.  Psychiatric:        Behavior: Behavior normal.    ED Results / Procedures / Treatments   Labs (all labs ordered are listed, but only abnormal results are displayed) Labs Reviewed - No data to  display  EKG None  Radiology No results found.  Procedures Procedures   Medications Ordered in ED Medications  acetaminophen (TYLENOL) tablet 1,000 mg (has no administration in time range)  ketorolac (TORADOL) 15 MG/ML injection 15 mg (has no administration in time range)  oxyCODONE (Oxy IR/ROXICODONE) immediate release tablet 5 mg (has no administration in time range)  diazepam (VALIUM) tablet 5 mg (has no administration in time range)    ED Course  I have reviewed the triage vital signs and the nursing notes.  Pertinent labs & imaging results that were available during my care of the patient were reviewed by me and considered in my medical decision making (see chart for details).    MDM Rules/Calculators/A&P                         40 yo M with a chief complaints of left shoulder pain.  Difficult to localize where the patient's issue is he is describing some tingling to the hand which seems unlikely to have developed from a primary shoulder pathology.  He does have significant pain when you range the shoulder though which makes that seem more likely.  He has an inconsistent neuromuscular exam to the left upper extremity.  I do not obviously notice a neurologic defect there.  We will hold off on advanced imaging.  Treat with a Medrol Dosepak.  Have him follow-up with his orthopedist.  12:58 AM:  I have discussed the diagnosis/risks/treatment options with the patient and family and believe the pt to be eligible for discharge home to follow-up with Ortho. We also discussed returning to the ED immediately if new or worsening sx occur. We discussed the sx which are most concerning (e.g., sudden worsening pain, fever, inability to tolerate by mouth) that necessitate immediate return. Medications administered to the patient during their visit and any new prescriptions provided to the  patient are listed below.  Medications given during this visit Medications  acetaminophen (TYLENOL) tablet  1,000 mg (has no administration in time range)  ketorolac (TORADOL) 15 MG/ML injection 15 mg (has no administration in time range)  oxyCODONE (Oxy IR/ROXICODONE) immediate release tablet 5 mg (has no administration in time range)  diazepam (VALIUM) tablet 5 mg (has no administration in time range)     The patient appears reasonably screen and/or stabilized for discharge and I doubt any other medical condition or other [facility] requiring further screening, evaluation, or treatment in the ED at this time prior to discharge.      Final Clinical Impression(s) / ED Diagnoses Final diagnoses:  Radiculopathy of cervical spine    Rx / DC Orders ED Discharge Orders          Ordered    methylPREDNISolone (MEDROL DOSEPAK) 4 MG TBPK tablet        12/23/20 0046    diclofenac Sodium (VOLTAREN) 1 % GEL  4 times daily        12/23/20 [place], [place], DO 12/23/20 [phone]

## 2020-12-23 NOTE — Discharge Instructions (Signed)
Take tylenol 1000mg (2 extra strength) four times a day.  Use the gel as prescribed.  Take the steroids as prescribed.    Call your ortho doc.

## 2020-12-25 ENCOUNTER — Other Ambulatory Visit: Payer: Self-pay | Admitting: Orthopaedic Surgery

## 2020-12-25 DIAGNOSIS — M542 Cervicalgia: Secondary | ICD-10-CM

## 2020-12-25 DIAGNOSIS — M25512 Pain in left shoulder: Secondary | ICD-10-CM

## 2021-01-01 ENCOUNTER — Encounter: Payer: Self-pay | Admitting: Internal Medicine

## 2021-01-01 ENCOUNTER — Ambulatory Visit (INDEPENDENT_AMBULATORY_CARE_PROVIDER_SITE_OTHER): Payer: 59 | Admitting: Internal Medicine

## 2021-01-01 ENCOUNTER — Ambulatory Visit
Admission: RE | Admit: 2021-01-01 | Discharge: 2021-01-01 | Disposition: A | Discharge status: Home / Self Care | Payer: No Typology Code available for payment source | Source: Ambulatory Visit | Attending: Orthopaedic Surgery | Admitting: Orthopaedic Surgery

## 2021-01-01 ENCOUNTER — Other Ambulatory Visit: Payer: Self-pay

## 2021-01-01 ENCOUNTER — Ambulatory Visit
Admission: RE | Admit: 2021-01-01 | Discharge: 2021-01-01 | Disposition: A | Discharge status: Home / Self Care | Payer: 59 | Source: Ambulatory Visit | Attending: Orthopaedic Surgery | Admitting: Orthopaedic Surgery

## 2021-01-01 VITALS — BP 136/80 | HR 90 | Temp 99.5°F | Ht 76.0 in | Wt 340.0 lb

## 2021-01-01 DIAGNOSIS — Z0001 Encounter for general adult medical examination with abnormal findings: Secondary | ICD-10-CM

## 2021-01-01 DIAGNOSIS — E538 Deficiency of other specified B group vitamins: Secondary | ICD-10-CM | POA: Diagnosis not present

## 2021-01-01 DIAGNOSIS — E785 Hyperlipidemia, unspecified: Secondary | ICD-10-CM | POA: Diagnosis not present

## 2021-01-01 DIAGNOSIS — M5412 Radiculopathy, cervical region: Secondary | ICD-10-CM

## 2021-01-01 DIAGNOSIS — Z125 Encounter for screening for malignant neoplasm of prostate: Secondary | ICD-10-CM | POA: Diagnosis not present

## 2021-01-01 DIAGNOSIS — Z23 Encounter for immunization: Secondary | ICD-10-CM

## 2021-01-01 DIAGNOSIS — F5101 Primary insomnia: Secondary | ICD-10-CM

## 2021-01-01 DIAGNOSIS — M542 Cervicalgia: Secondary | ICD-10-CM

## 2021-01-01 DIAGNOSIS — E559 Vitamin D deficiency, unspecified: Secondary | ICD-10-CM | POA: Diagnosis not present

## 2021-01-01 DIAGNOSIS — M25512 Pain in left shoulder: Secondary | ICD-10-CM

## 2021-01-01 DIAGNOSIS — R739 Hyperglycemia, unspecified: Secondary | ICD-10-CM | POA: Diagnosis not present

## 2021-01-01 LAB — URINALYSIS, ROUTINE W REFLEX MICROSCOPIC
Bilirubin Urine: NEGATIVE
Hgb urine dipstick: NEGATIVE
Ketones, ur: NEGATIVE
Leukocytes,Ua: NEGATIVE
Nitrite: NEGATIVE
RBC / HPF: NONE SEEN (ref 0–?)
Specific Gravity, Urine: 1.02 (ref 1.000–1.030)
Total Protein, Urine: NEGATIVE
Urine Glucose: NEGATIVE
Urobilinogen, UA: 0.2 (ref 0.0–1.0)
pH: 6 (ref 5.0–8.0)

## 2021-01-01 LAB — LIPID PANEL
Cholesterol: 228 mg/dL — ABNORMAL HIGH (ref 0–200)
HDL: 48.4 mg/dL (ref >39.00)
LDL Cholesterol: 150 mg/dL — ABNORMAL HIGH (ref 0–99)
NonHDL: 179.1
Total CHOL/HDL Ratio: 5
Triglycerides: 144 mg/dL (ref 0.0–149.0)
VLDL: 28.8 mg/dL (ref 0.0–40.0)

## 2021-01-01 LAB — CBC WITH DIFFERENTIAL/PLATELET
Basophils Absolute: 0 10*3/uL (ref 0.0–0.1)
Basophils Relative: 0.5 % (ref 0.0–3.0)
Eosinophils Absolute: 0.2 10*3/uL (ref 0.0–0.7)
Eosinophils Relative: 2.1 % (ref 0.0–5.0)
HCT: 41 % (ref 39.0–52.0)
Hemoglobin: 13.5 g/dL (ref 13.0–17.0)
Lymphocytes Relative: 29.4 % (ref 12.0–46.0)
Lymphs Abs: 2.1 10*3/uL (ref 0.7–4.0)
MCHC: 32.9 g/dL (ref 30.0–36.0)
MCV: 87.8 fl (ref 78.0–100.0)
Monocytes Absolute: 0.5 10*3/uL (ref 0.1–1.0)
Monocytes Relative: 6.8 % (ref 3.0–12.0)
Neutro Abs: 4.4 10*3/uL (ref 1.4–7.7)
Neutrophils Relative %: 61.2 % (ref 43.0–77.0)
Platelets: 266 10*3/uL (ref 150.0–400.0)
RBC: 4.68 Mil/uL (ref 4.22–5.81)
RDW: 15 % (ref 11.5–15.5)
WBC: 7.2 10*3/uL (ref 4.0–10.5)

## 2021-01-01 LAB — BASIC METABOLIC PANEL
BUN: 15 mg/dL (ref 6–23)
CO2: 32 mEq/L (ref 19–32)
Calcium: 9.4 mg/dL (ref 8.4–10.5)
Chloride: 101 mEq/L (ref 96–112)
Creatinine, Ser: 0.94 mg/dL (ref 0.40–1.50)
GFR: 100.6 mL/min (ref >60.00)
Glucose, Bld: 90 mg/dL (ref 70–99)
Potassium: 4.5 mEq/L (ref 3.5–5.1)
Sodium: 138 mEq/L (ref 135–145)

## 2021-01-01 LAB — HEPATIC FUNCTION PANEL
ALT: 21 U/L (ref 0–53)
AST: 14 U/L (ref 0–37)
Albumin: 4 g/dL (ref 3.5–5.2)
Alkaline Phosphatase: 76 U/L (ref 39–117)
Bilirubin, Direct: 0.1 mg/dL (ref 0.0–0.3)
Total Bilirubin: 0.7 mg/dL (ref 0.2–1.2)
Total Protein: 7.3 g/dL (ref 6.0–8.3)

## 2021-01-01 LAB — MICROALBUMIN / CREATININE URINE RATIO
Creatinine,U: 122.8 mg/dL
Microalb Creat Ratio: 0.6 mg/g (ref 0.0–30.0)
Microalb, Ur: <0.7 mg/dL (ref 0.0–1.9)

## 2021-01-01 LAB — VITAMIN D 25 HYDROXY (VIT D DEFICIENCY, FRACTURES): VITD: 15.51 ng/mL — ABNORMAL LOW (ref 30.00–100.00)

## 2021-01-01 LAB — VITAMIN B12: Vitamin B-12: 458 pg/mL (ref 211–911)

## 2021-01-01 LAB — TSH: TSH: 1.85 u[IU]/mL (ref 0.35–5.50)

## 2021-01-01 LAB — HEMOGLOBIN A1C: Hgb A1c MFr Bld: 6.3 % (ref 4.6–6.5)

## 2021-01-01 LAB — PSA: PSA: 0.83 ng/mL (ref 0.10–4.00)

## 2021-01-01 IMAGING — MR MR CERVICAL SPINE W/O CM
5 series · 30 of 48 positions shown · non-contrast
Comparison: None.

CLINICAL DATA: Left neck pain into left shoulder

EXAM:
MRI CERVICAL SPINE WITHOUT CONTRAST
TECHNIQUE: Multiplanar, multisequence MR imaging of the cervical spine was
performed. No intravenous contrast was administered.

[Series 6: T1 · sagittal · 3.0mm · 0.86mm/px · 6 of 14 slices shown]
[im 1/14]
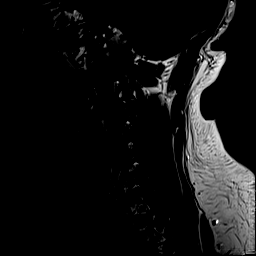
[im 3/14]
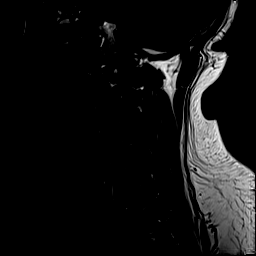
[im 6/14]
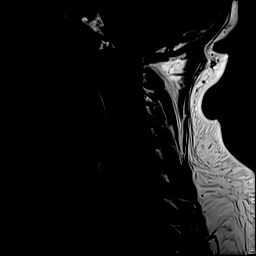
[im 8/14]
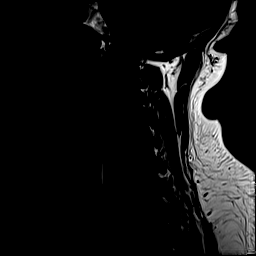
[im 11/14]
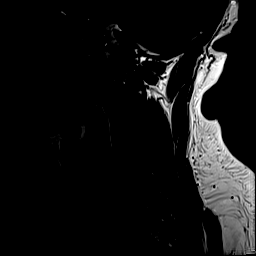
[im 14/14]
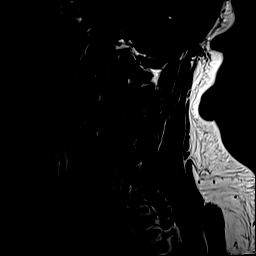

[Series 7: T2 · sagittal · 3.0mm · 0.69mm/px · 6 of 14 slices shown (1 of 2)]
[im 1/14]
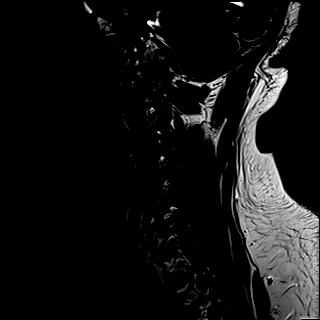
[im 3/14]
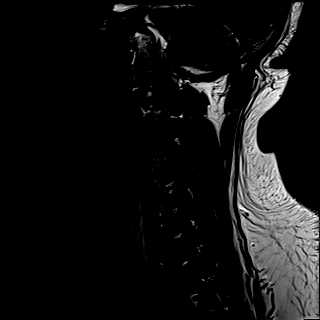
[im 6/14]
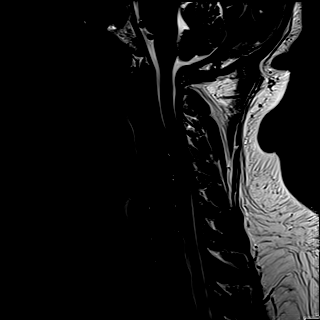
[im 8/14]
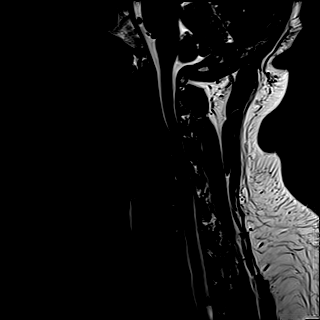
[im 11/14]
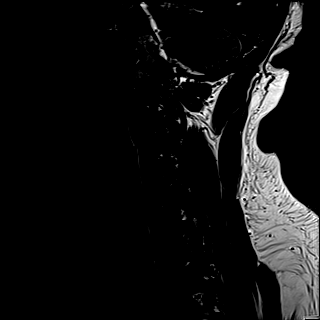
[im 14/14]
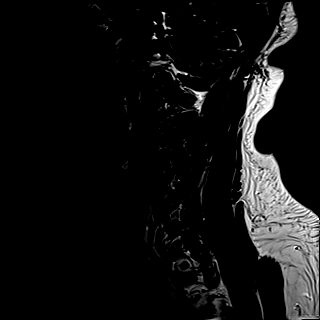

[Series 8: STIR · sagittal · 3.0mm · 0.34mm/px · 6 of 14 slices shown]
[im 1/14]
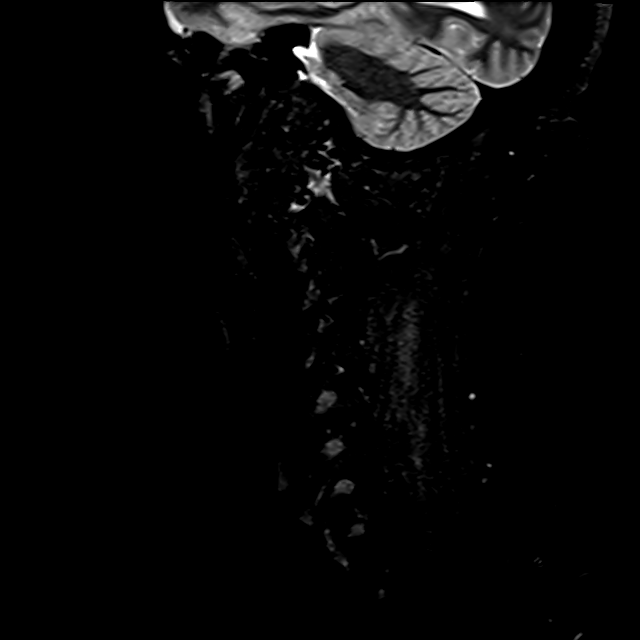
[im 3/14]
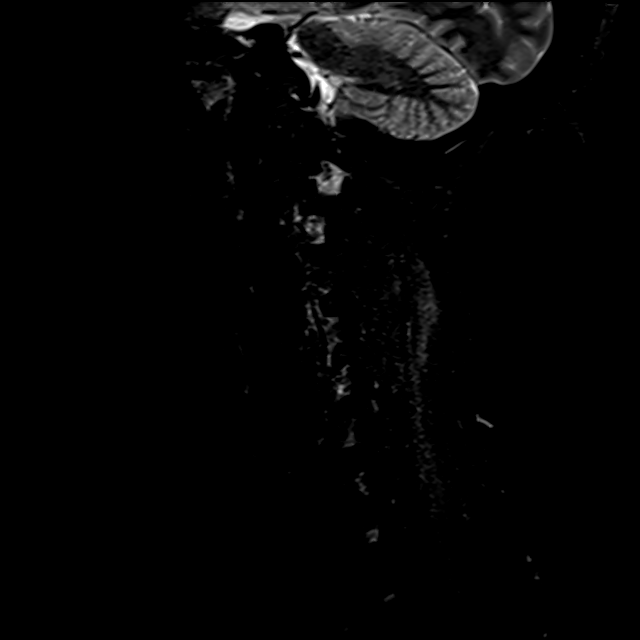
[im 6/14]
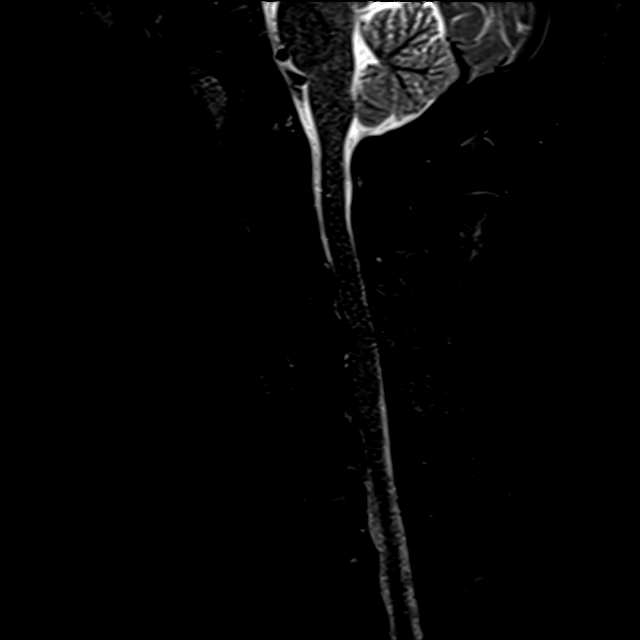
[im 8/14]
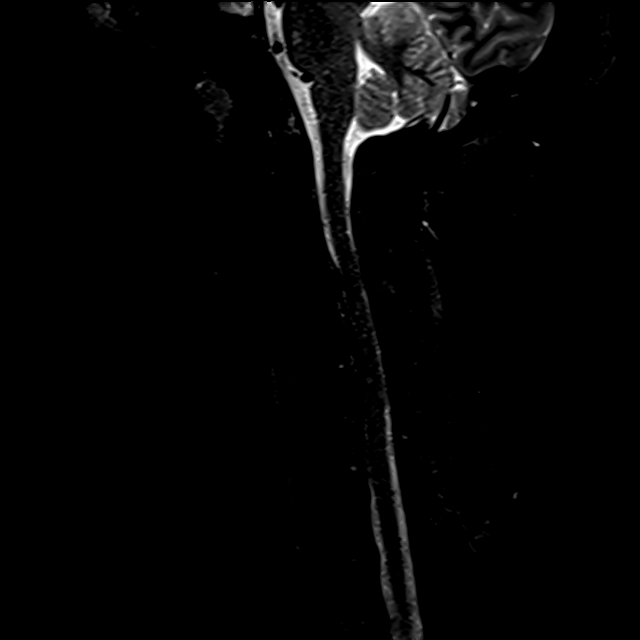
[im 11/14]
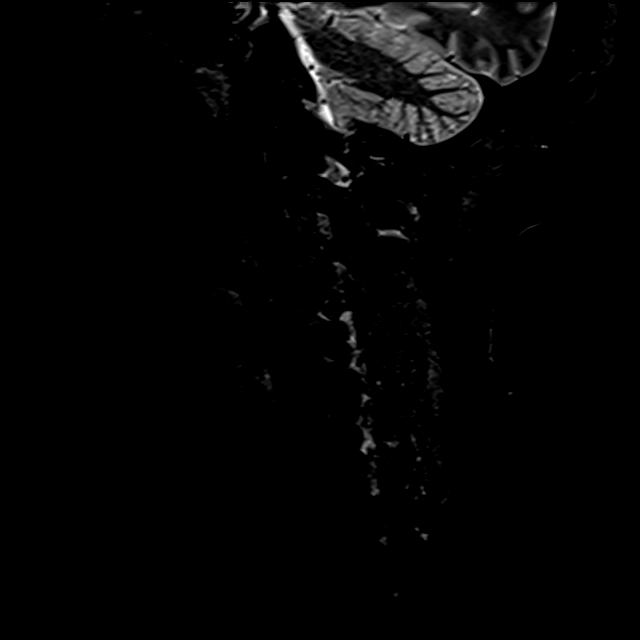
[im 14/14]
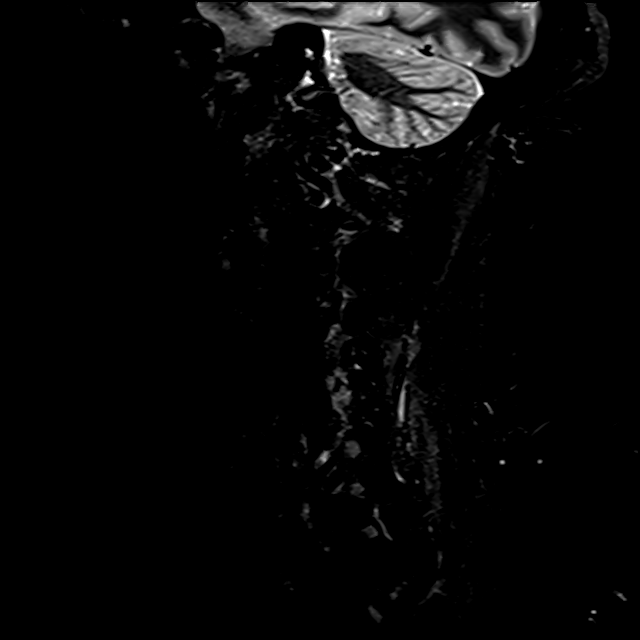

[Series 9: T2 · axial · 3.0mm · 0.70mm/px · z∈[-33,+78]mm · 9 of 35 slices shown (2 of 2)]
[im 1/35]
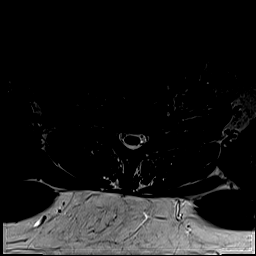
[im 5/35]
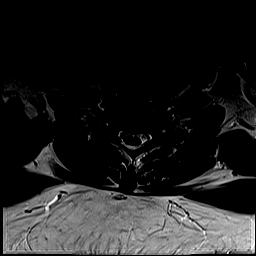
[im 10/35]
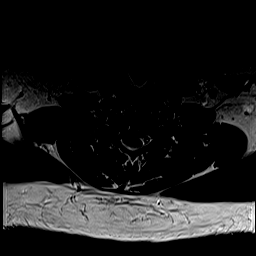
[im 15/35]
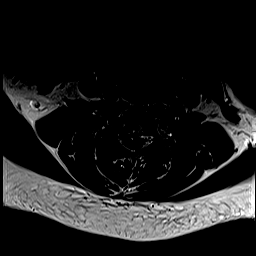
[im 18/35]
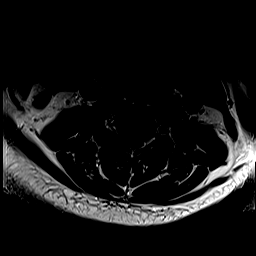
[im 20/35]
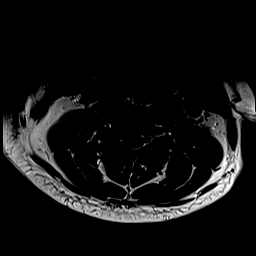
[im 25/35]
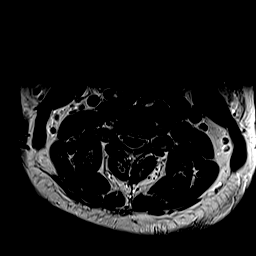
[im 30/35]
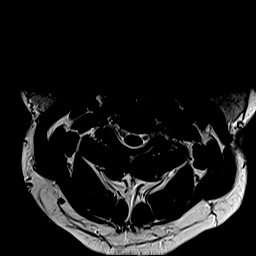
[im 35/35]
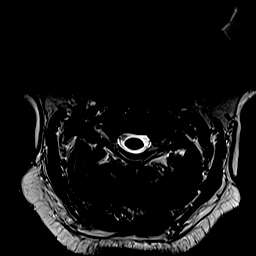

[Series 10: GRE · axial · 3.0mm · 0.35mm/px · z∈[-33,-3]mm · 3 of 35 slices shown]
[im 1/35]
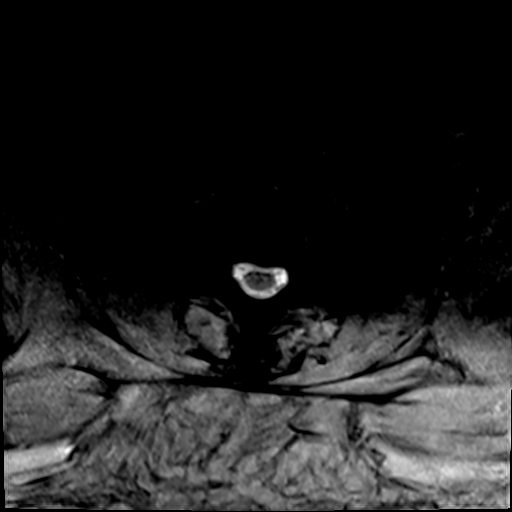
[im 5/35]
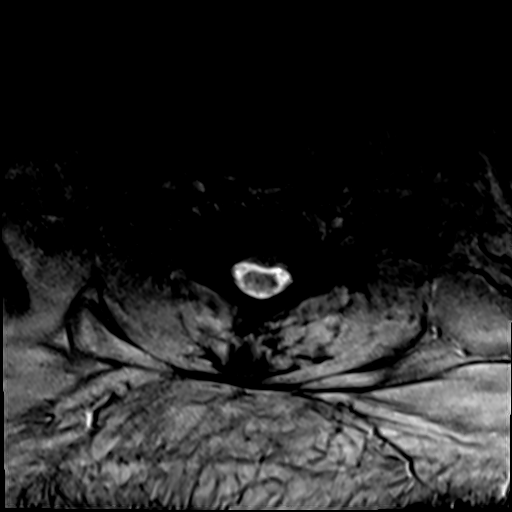
[im 10/35]
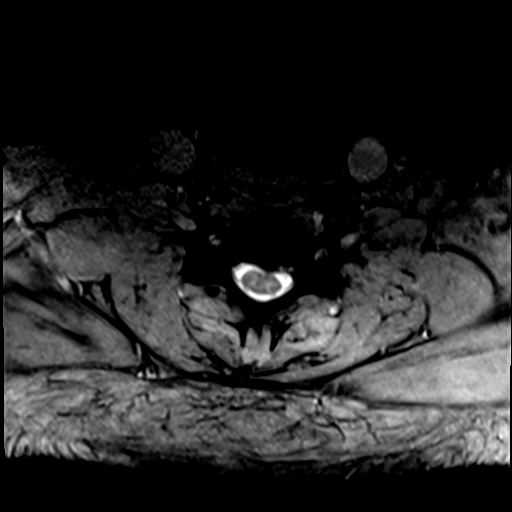

[30 of 48 positions shown; findings below may reference images not displayed]

FINDINGS: Alignment: Straightening and mild reversal of the normal cervical
lordosis. No significant listhesis.

Vertebrae: No fracture, evidence of discitis, or bone lesion.
Congenitally short pedicles, which narrow the AP diameter of the
spinal canal.

Cord: Normal cord signal and caliber. Mild cord flattening at C4-C5
and focal cord deformation at C5-C6.

Posterior Fossa, vertebral arteries, paraspinal tissues: Negative.

Disc levels:

C2-C3: No significant disc bulge. No spinal canal stenosis or
neuroforaminal narrowing.

C3-C4: Mild disc bulge. Uncovertebral and facet arthropathy.
Moderate spinal canal stenosis. Severe right and moderate left
neural foraminal narrowing.

C4-C5: Moderate disc bulge, which flattens the ventral spinal cord.
Moderate to severe spinal canal stenosis. Moderate right and
moderate to severe left neural foraminal narrowing.

C5-C6: Disc bulge with superimposed left paracentral disc
protrusion, which indents and deforms the ventral cord. Moderate
spinal canal stenosis. Severe left and mild right neural foraminal
narrowing.

C6-C7: Mild disc bulge. No spinal canal stenosis. Mild bilateral
neural foraminal narrowing.

C7-T1: Mild disc bulge. No spinal canal stenosis. Mild left neural
foraminal narrowing.
IMPRESSION: 1. C4-C5 moderate to severe spinal canal stenosis with moderate to
severe left and moderate right neural foraminal narrowing.
2. C5-C6 moderate spinal canal stenosis severe left and mild right
neural foraminal narrowing.
3. C3-C4 moderate spinal canal stenosis with severe right and
moderate left neural foraminal narrowing.
4. Additional mild neural foraminal narrowing bilaterally at C6-C7
and on the left at C7-T1.

## 2021-01-01 IMAGING — MR MR SHOULDER*L* W/O CM
4 of 5 series · 29 of 40 positions shown · non-contrast
Comparison: None.

CLINICAL DATA: Left neck and shoulder pain for the past 2-3 weeks.
Possible lifting injury.

EXAM:
MRI OF THE LEFT SHOULDER WITHOUT CONTRAST
TECHNIQUE: Multiplanar, multisequence MR imaging of the shoulder was performed.
No intravenous contrast was administered.

[Series 4: T2 fat-sat · axial · left · 4.0mm · 0.27mm/px · z∈[-26,+98]mm · 8 of 27 slices shown (1 of 3)]
[im 1/27]
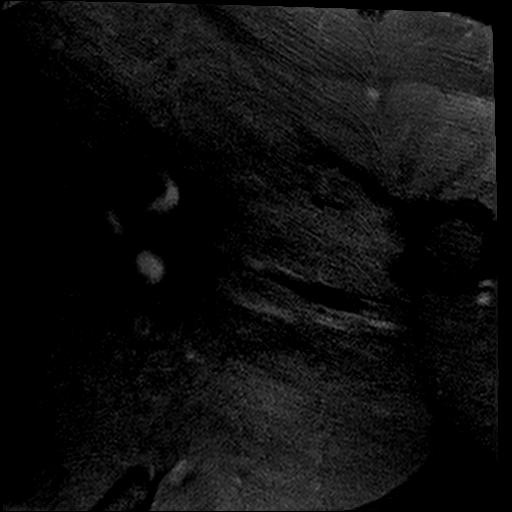
[im 3/27]
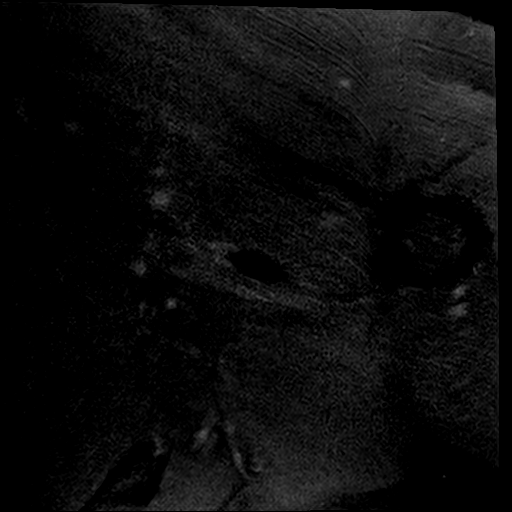
[im 9/27]
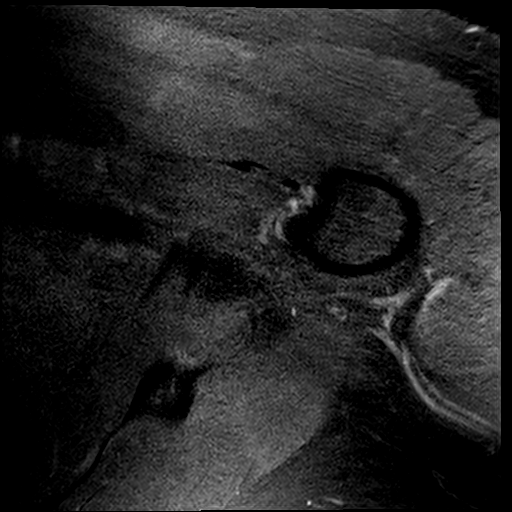
[im 12/27]
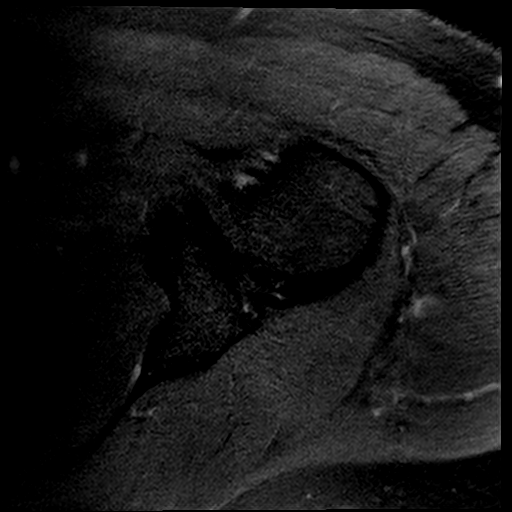
[im 15/27]
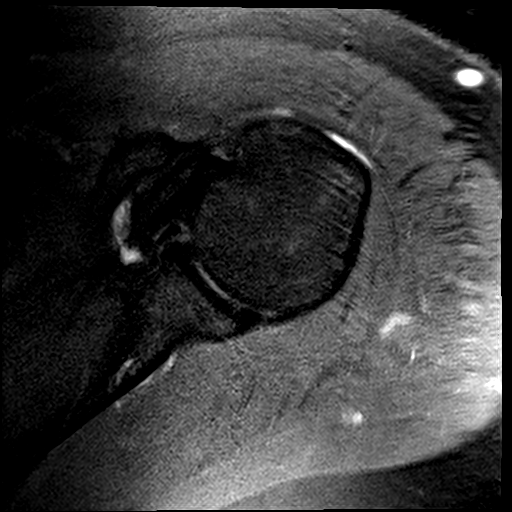
[im 18/27]
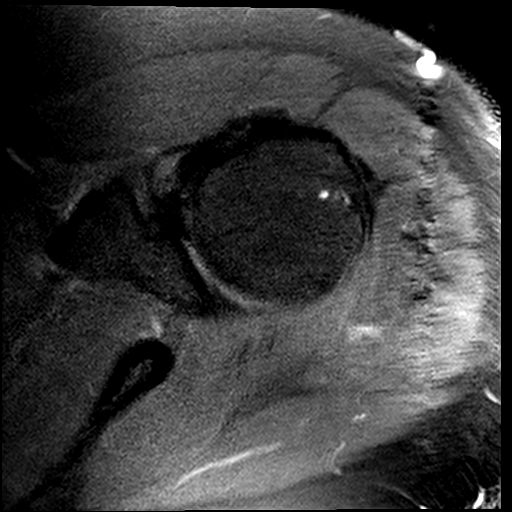
[im 24/27]
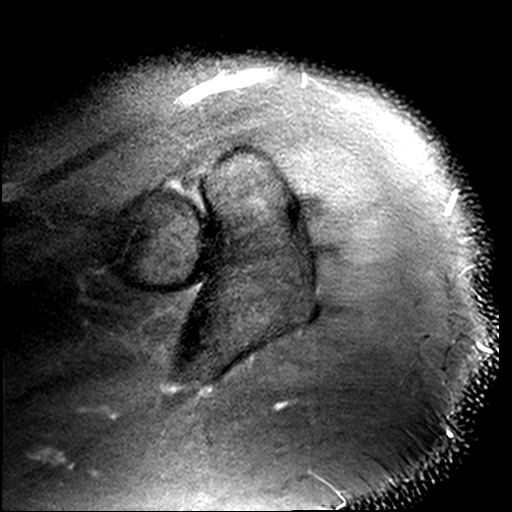
[im 27/27]
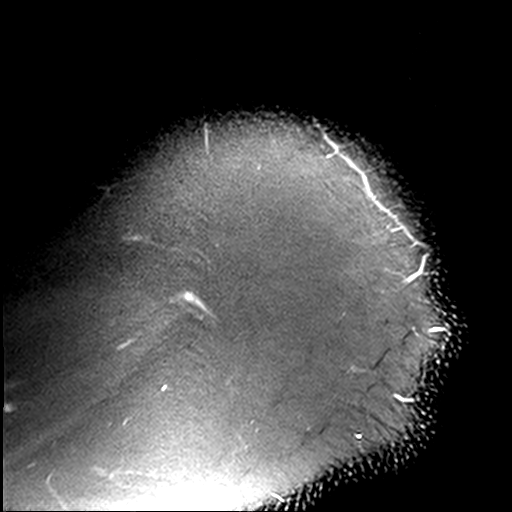

[Series 5: T2 fat-sat · oblique · left · 4.0mm · 0.27mm/px · 7 of 21 slices shown (2 of 3)]
[im 1/21]
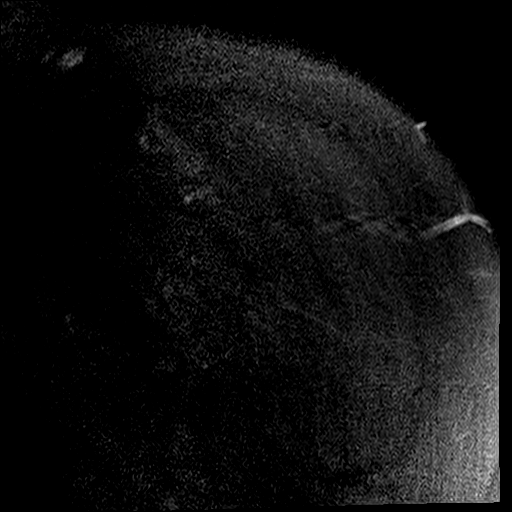
[im 4/21]
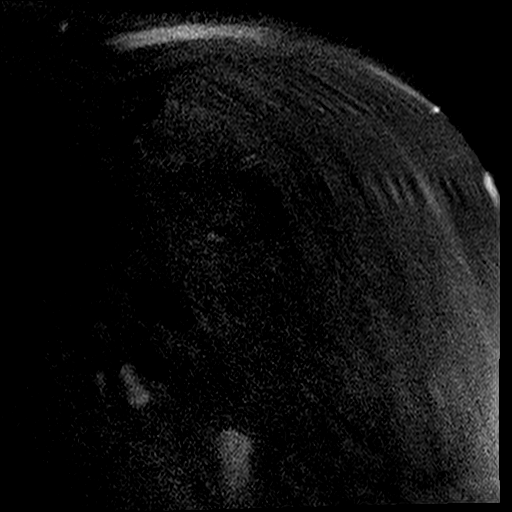
[im 7/21]
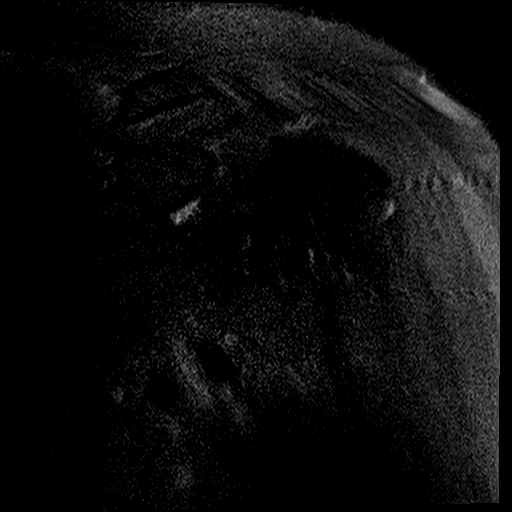
[im 11/21]
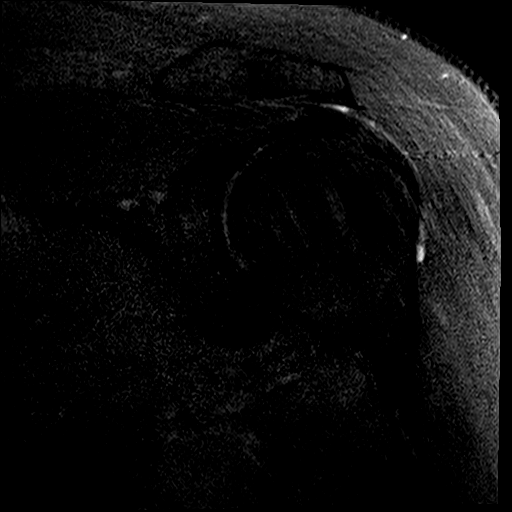
[im 14/21]
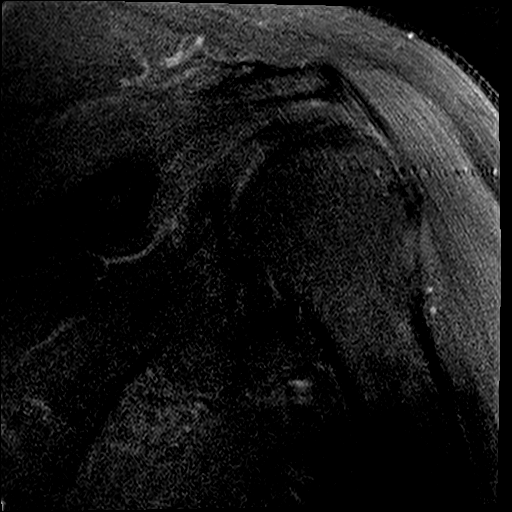
[im 17/21]
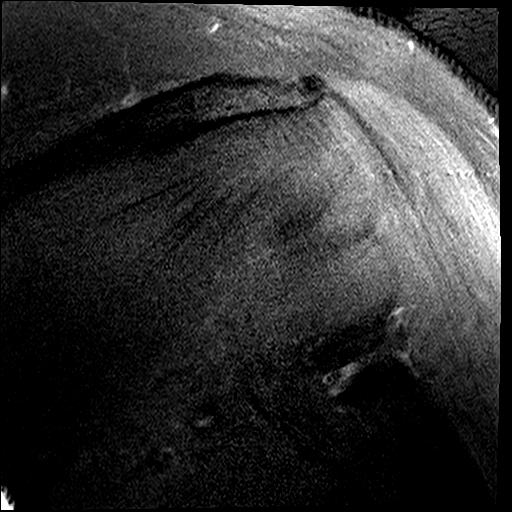
[im 21/21]
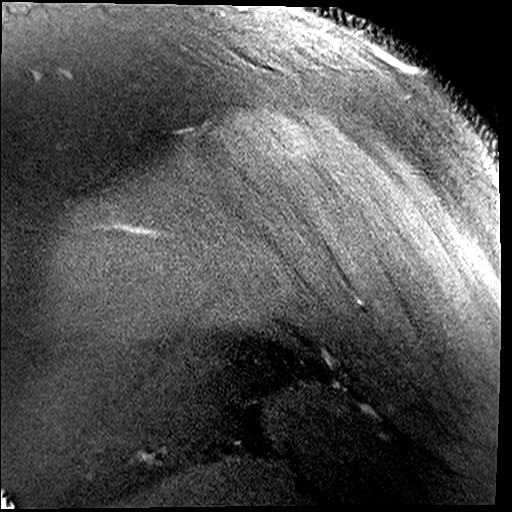

[Series 6: PD · oblique · left · 4.0mm · 0.55mm/px · 7 of 21 slices shown]
[im 1/21]
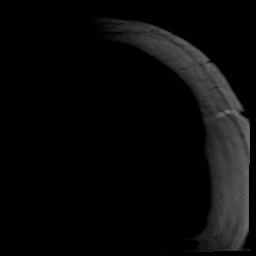
[im 4/21]
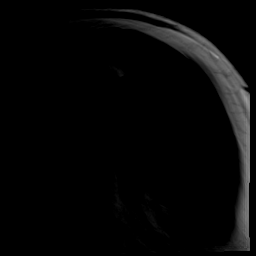
[im 7/21]
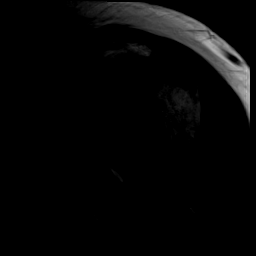
[im 11/21]
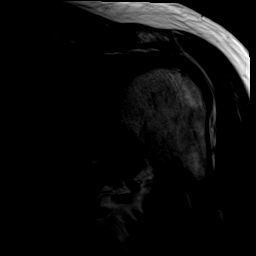
[im 14/21]
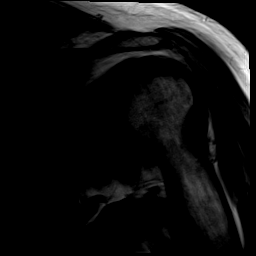
[im 17/21]
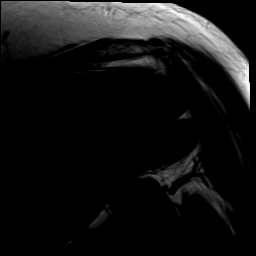
[im 21/21]
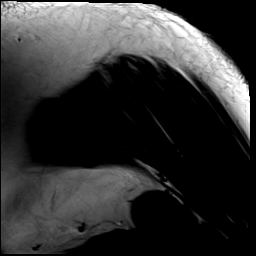

[Series 7: T2 fat-sat · sagittal · left · 4.0mm · 0.55mm/px · 7 of 22 slices shown (3 of 3)]
[im 1/22]
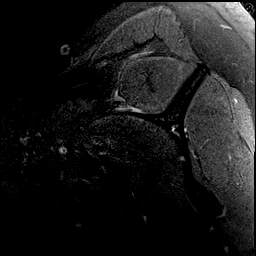
[im 4/22]
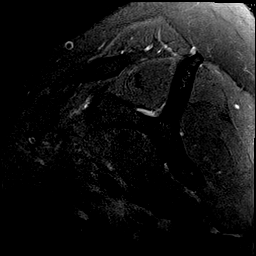
[im 7/22]
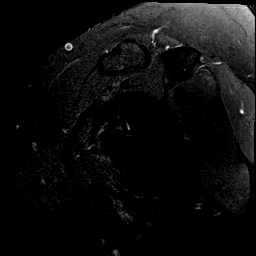
[im 10/22]
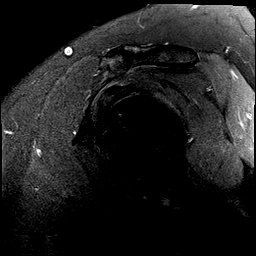
[im 13/22]
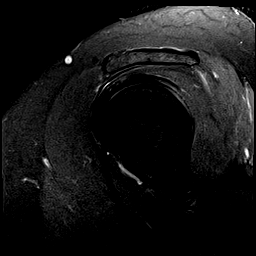
[im 16/22]
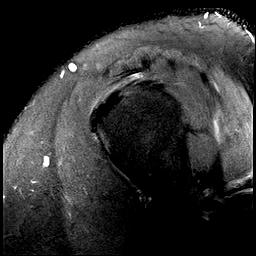
[im 19/22]
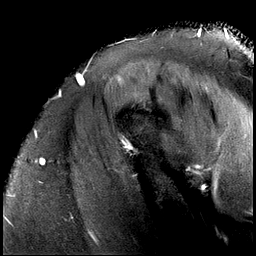

[29 of 40 positions shown; findings below may reference images not displayed]

FINDINGS: Despite efforts by the technologist and patient, motion artifact is
present on today's exam and could not be eliminated. This reduces
exam sensitivity and specificity.

Rotator cuff: Intact rotator cuff. Mild supraspinatus,
infraspinatus, and subscapularis tendinosis.

Muscles: No atrophy or abnormal signal of the muscles of the rotator
cuff.

Biceps long head:  Intact and normally positioned.

Acromioclavicular Joint: Mild arthropathy of the acromioclavicular
joint. Type II acromion. Trace subacromial/subdeltoid bursal fluid.

Glenohumeral Joint: No joint effusion. No chondral defect.

Labrum: Grossly intact, but evaluation is limited by lack of
intraarticular fluid. Anterior superior sublabral foramen.

Bones:  No marrow abnormality, fracture or dislocation.

Other: Partial loss of the normal fat within the rotator interval.
Thickening of the middle and inferior glenohumeral ligaments. No
significant edema in the axillary recess.
IMPRESSION: 1. Mild rotator cuff tendinosis. No rotator cuff tendon tear.
2. Findings which can be seen with adhesive capsulitis.
3. Mild acromioclavicular osteoarthritis.

## 2021-01-01 MED ORDER — ZOLPIDEM TARTRATE 10 MG PO TABS: 10.0000 mg | ORAL_TABLET | Freq: Every evening | ORAL | 1 refills | Status: DC | PRN

## 2021-01-01 MED ORDER — HYDROCODONE-ACETAMINOPHEN 7.5-325 MG PO TABS: 1.0000 | ORAL_TABLET | Freq: Four times a day (QID) | ORAL | 0 refills | Status: DC | PRN

## 2021-01-01 MED ORDER — GABAPENTIN 100 MG PO CAPS: 100.0000 mg | ORAL_CAPSULE | Freq: Three times a day (TID) | ORAL | 3 refills | Status: DC

## 2021-01-01 NOTE — Progress Notes (Signed)
Patient ID: Marcus Burns, male   DOB: 06/15/1980, 40 y.o.   MRN: [id number]         Chief Complaint:: wellness exam and left neck pain , insomnia, hld, hyperglycemia, low vit d       HPI:  Marcus Burns is a 40 y.o. male here for wellness exam; plans to call for eye exam soon; declines covid booster, and pneumovax, o/w up to date                        Also c/o know left cervical radiculopathy seen per ortho and MRI ordered, but pain is now 10/10, asking for pain contol.  Constant, severe with LUe mild weakness, not helped with cortisone  or prednisone.  Nothing else makes better or worse.  Pt denies chest pain, increased sob or doe, wheezing, orthopnea, PND, increased LE swelling, palpitations, dizziness or syncope.   Pt denies polydipsia, polyuria, or new focal neuro s/s.   Pt denies fever, wt loss, night sweats, loss of appetite, or other constitutional symptoms  Also c/o 2 mo worsning difficulty gettng to sleep most nights, just cant turn off the brain, Denies worsening depressive symptoms, suicidal ideation, or panic.   BP Readings from Last 3 Encounters:  01/01/21 136/80  12/23/20 (!) 140/92  10/17/19 140/90   Immunization History  Administered Date(s) Administered   Influenza Split 10/21/2010   Influenza, Seasonal, Injecte, Preservative Fre 01/14/2012   Influenza,inj,Quad PF,6+ Mos 11/19/2017, 11/29/2018, 01/01/2021   Influenza-Unspecified 11/05/2014, 11/07/2016   PFIZER(Purple Top)SARS-COV-2 Vaccination 04/17/2019, 05/09/2019   Pneumococcal Polysaccharide-23 05/13/2017   Td 02/05/2008   Tdap 09/15/2018   Health Maintenance Due  Topic Date Due   OPHTHALMOLOGY EXAM  02/19/2020      Past Medical History:  Diagnosis Date   Acute prostatitis 06/19/2007   ALLERGIC RHINITIS 06/19/2007   ANXIETY 06/19/2007   ELEVATED BLOOD PRESSURE WITHOUT DIAGNOSIS OF HYPERTENSION 06/19/2007   GERD 06/19/2007   GERD (gastroesophageal reflux disease)    History of COVID-19 01/24/2019    History of kidney stones    Inguinal hernia    Migraines    NEPHROLITHIASIS, HX OF 06/19/2007   Past Surgical History:  Procedure Laterality Date   CYSTOSCOPY WITH RETROGRADE PYELOGRAM, URETEROSCOPY AND STENT PLACEMENT Bilateral 02/09/2019   Procedure: CYSTOSCOPY WITH RETROGRADE PYELOGRAM, DIAGNOSTIC URETEROSCOPY AND STENT PLACEMENT;  Surgeon: Sebastian Ache, MD;  Location: [facility];  Service: Urology;  Laterality: Bilateral;  75 MINS   CYSTOSCOPY WITH RETROGRADE PYELOGRAM, URETEROSCOPY AND STENT PLACEMENT Bilateral 02/23/2019   Procedure: CYSTOSCOPY WITH RETROGRADE PYELOGRAM, URETEROSCOPY AND STENT PLACEMENT;  Surgeon: Sebastian Ache, MD;  Location: [facility];  Service: Urology;  Laterality: Bilateral;  1 HR   HERNIA REPAIR     HOLMIUM LASER APPLICATION Bilateral 02/23/2019   Procedure: HOLMIUM LASER APPLICATION;  Surgeon: Sebastian Ache, MD;  Location: [facility];  Service: Urology;  Laterality: Bilateral;    reports that he has quit smoking. He has quit using smokeless tobacco. He reports that he does not drink alcohol and does not use drugs. family history includes Anxiety disorder in his brother and mother; Coronary artery disease in his father; Diabetes in his brother. Allergies  Allergen Reactions   Doxycycline Other (See Comments)    Light headed , passed out   Current Outpatient Medications on File Prior to Visit  Medication Sig Dispense Refill   ALPRAZolam (XANAX) 0.5 MG tablet TAKE 1 TABLET(0.5 MG) BY MOUTH AT BEDTIME AS NEEDED FOR SLEEP  OR ANXIETY 30 tablet 2   APPLE CIDER VINEGAR PO Take 1 capsule by mouth daily.     cetirizine (ZYRTEC) 10 MG tablet Take 10 mg by mouth daily as needed for allergies.     Cholecalciferol (VITAMIN D) 50 MCG (2000 UT) tablet Take 2,000 Units by mouth daily.     citalopram (CELEXA) 20 MG tablet Take 1 tablet (20 mg total) by mouth daily. 90 tablet 3   diphenhydrAMINE (BENADRYL) 25 MG tablet Take 50 mg by mouth daily as needed for allergies.     Multiple  Vitamin (MULTIVITAMIN WITH MINERALS) TABS tablet Take 1 tablet by mouth at bedtime.     diclofenac Sodium (VOLTAREN) 1 % GEL Apply 4 g topically 4 (four) times daily. (Patient not taking: Reported on 01/01/2021) 100 g 0   [DISCONTINUED] triamcinolone (NASACORT) 55 MCG/ACT AERO nasal inhaler Place 2 sprays into the nose daily. (Patient not taking: Reported on 02/01/2019) 1 Inhaler 12   No current facility-administered medications on file prior to visit.        ROS:  All others reviewed and negative.  Objective        PE:  BP 136/80 (BP Location: Right Arm, Patient Position: Sitting, Cuff Size: Large)    Pulse 90    Temp 99.5 F (37.5 C) (Oral)    Ht 6\' 4"  (1.93 m)    Wt (!) 340 lb (154.2 kg)    SpO2 98%    BMI 41.39 kg/m                 Constitutional: Pt appears in NAD               HENT: Head: NCAT.                Right Ear: External ear normal.                 Left Ear: External ear normal.                Eyes: . Pupils are equal, round, and reactive to light. Conjunctivae and EOM are normal               Nose: without d/c or deformity               Neck: Neck supple. Gross normal ROM               Cardiovascular: Normal rate and regular rhythm.                 Pulmonary/Chest: Effort normal and breath sounds without rales or wheezing.                Abd:  Soft, NT, ND, + BS, no organomegaly               Neurological: Pt is alert. At baseline orientation, motor grossly intact except LUE 4/5               Skin: Skin is warm. No rashes, no other new lesions, LE edema - none               Psychiatric: Pt behavior is normal without agitation   Micro: none  Cardiac tracings I have personally interpreted today:  none  Pertinent Radiological findings (summarize): none   Lab Results  Component Value Date   WBC 7.2 01/01/2021   HGB 13.5 01/01/2021   HCT 41.0 01/01/2021   PLT 266.0 01/01/2021   GLUCOSE 90  01/01/2021   CHOL 228 (H) 01/01/2021   TRIG 144.0 01/01/2021   HDL 48.40  01/01/2021   LDLDIRECT 121.0 10/17/2019   LDLCALC 150 (H) 01/01/2021   ALT 21 01/01/2021   AST 14 01/01/2021   NA 138 01/01/2021   K 4.5 01/01/2021   CL 101 01/01/2021   CREATININE 0.94 01/01/2021   BUN 15 01/01/2021   CO2 32 01/01/2021   TSH 1.85 01/01/2021   PSA 0.83 01/01/2021   HGBA1C 6.3 01/01/2021   MICROALBUR <0.7 01/01/2021   Assessment/Plan:  Marcus Burns is a 40 y.o. Black or African American [2] male with  has a past medical history of Acute prostatitis (06/19/2007), ALLERGIC RHINITIS (06/19/2007), ANXIETY (06/19/2007), ELEVATED BLOOD PRESSURE WITHOUT DIAGNOSIS OF HYPERTENSION (06/19/2007), GERD (06/19/2007), GERD (gastroesophageal reflux disease), History of COVID-19 (01/24/2019), History of kidney stones, Inguinal hernia, Migraines, and NEPHROLITHIASIS, HX OF (06/19/2007).  Encounter for well adult exam with abnormal findings Age and sex appropriate education and counseling updated with regular exercise and diet Referrals for preventative services - pt states will call for eye exam soon Immunizations addressed - declines covid booster and pneumovax Smoking counseling  - none needed Evidence for depression or other mood disorder - none significant Most recent labs reviewed. I have personally reviewed and have noted: 1) the patient's medical and social history 2) The patient's current medications and supplements 3) The patient's height, weight, and BMI have been recorded in the chart   Hyperlipidemia Lab Results  Component Value Date   LDLCALC 150 (H) 01/01/2021   Severe uncontrolled, pt to continue lo chol diet, declines statin for now   Hyperglycemia Lab Results  Component Value Date   HGBA1C 6.3 01/01/2021   Stable, pt to continue current medical treatment   diet   Vitamin D deficiency Last vitamin D Lab Results  Component Value Date   VD25OH 15.51 (L) 01/01/2021   Low, to start oral replacement   Left cervical radiculopathy New recent worsening  - for hydrocodone prn, gabapentin 1000 tid and f/u MRI and ortho  Insomnia Ok for ambien qhs prn   Followup: Return in about 1 year (around 01/01/2022).  Oliver Barre, MD 01/03/2021 2:59 PM [facility]

## 2021-01-01 NOTE — Patient Instructions (Addendum)
Please take all new medication as prescribed - the hydrocodone for pain, gabapentin for nerve pain, and ambien for sleep as needed  Please continue all other medications as before, and refills have been done if requested.  Please have the pharmacy call with any other refills you may need.  Please continue your efforts at being more active, low cholesterol diet, and weight control.  Please keep your appointments with your specialists as you may have planned - MRI for the neck and shoulder later today, and Dr Jerl Santos after  Please go to the LAB at the blood drawing area for the tests to be done  You will be contacted by phone if any changes need to be made immediately.  Otherwise, you will receive a letter about your results with an explanation, but please check with MyChart first.  Please remember to sign up for MyChart if you have not done so, as this will be important to you in the future with finding out test results, communicating by private email, and scheduling acute appointments online when needed.  Please make an Appointment to return for your 1 year visit, or sooner if needed

## 2021-01-02 ENCOUNTER — Encounter: Payer: Self-pay | Admitting: Internal Medicine

## 2021-01-03 ENCOUNTER — Encounter: Payer: Self-pay | Admitting: Internal Medicine

## 2021-01-03 NOTE — Assessment & Plan Note (Signed)
Ok for ambien qhs prn 

## 2021-01-03 NOTE — Assessment & Plan Note (Signed)
Age and sex appropriate education and counseling updated with regular exercise and diet Referrals for preventative services - pt states will call for eye exam soon Immunizations addressed - declines covid booster and pneumovax Smoking counseling  - none needed Evidence for depression or other mood disorder - none significant Most recent labs reviewed. I have personally reviewed and have noted: 1) the patient's medical and social history 2) The patient's current medications and supplements 3) The patient's height, weight, and BMI have been recorded in the chart

## 2021-01-03 NOTE — Assessment & Plan Note (Signed)
Last vitamin D Lab Results  Component Value Date   VD25OH 15.51 (L) 01/01/2021   Low, to start oral replacement  

## 2021-01-03 NOTE — Assessment & Plan Note (Signed)
Lab Results  Component Value Date   LDLCALC 150 (H) 01/01/2021   Severe uncontrolled, pt to continue lo chol diet, declines statin for now

## 2021-01-03 NOTE — Assessment & Plan Note (Signed)
New recent worsening - for hydrocodone prn, gabapentin 1000 tid and f/u MRI and ortho

## 2021-01-03 NOTE — Assessment & Plan Note (Signed)
Lab Results  Component Value Date   HGBA1C 6.3 01/01/2021   Stable, pt to continue current medical treatment   diet

## 2021-01-14 MED ORDER — GABAPENTIN 300 MG PO CAPS: 300.0000 mg | ORAL_CAPSULE | Freq: Three times a day (TID) | ORAL | 3 refills | Status: DC

## 2021-03-03 ENCOUNTER — Other Ambulatory Visit: Payer: Self-pay | Admitting: Orthopedic Surgery

## 2021-03-23 NOTE — Progress Notes (Signed)
Surgical Instructions ? ? ? Your procedure is scheduled on April 01, 2021. ? Report to [facility] Main Entrance "A" at 11:00 A.M., then check in with the Admitting office. ? Call this number if you have problems the morning of surgery: ? [phone] ? ? If you have any questions prior to your surgery date call [phone]: Open Monday-Friday 8am-4pm ? ? Remember: ? Do not eat after midnight the night before your surgery ? ?You may drink clear liquids until 11:00am the morning of your surgery.   ?Clear liquids allowed are: Water, Non-Citrus Juices (without pulp), Carbonated Beverages, Clear Tea, Black Coffee ONLY (NO MILK, CREAM OR POWDERED CREAMER of any kind), and Gatorade ? ?Enhanced Recovery after Surgery for Orthopedics ?Enhanced Recovery after Surgery is a protocol used to improve the stress on your body and your recovery after surgery. ? ?Patient Instructions ? ?The day of surgery (if you do NOT have diabetes):  ?Drink ONE (1) Pre-Surgery Clear Ensure by 11:00am the morning of surgery   ?This drink was given to you during your hospital  ?pre-op appointment visit. ?Nothing else to drink after completing the  ?Pre-Surgery Clear Ensure. ? ?       If you have questions, please contact your surgeon?s office. ? ?  ? Take these medicines the morning of surgery with A SIP OF WATER:  ? ?If needed: ?Acetaminophen (Tylenol) ?Alprazolam (Xanax) ?Cetirizine (Zyrtec) ?Diphenhydramine (Benadryl) ?Gabapentin (Neurontin) ? ? ?As of today, STOP taking any Aspirin (unless otherwise instructed by your surgeon) Aleve, Naproxen, Ibuprofen, Motrin, Advil, Goody's, BC's, all herbal medications, fish oil, and all vitamins. ?         ?Do not wear jewelry or makeup ?Do not wear lotions, powders, perfumes/colognes, or deodorant. ?Do not shave 48 hours prior to surgery.  Men may shave face and neck. ?Do not bring valuables to the hospital. ?Do not wear nail polish, gel polish, artificial nails, or any other type of covering on natural  nails (fingers and toes) ?If you have artificial nails or gel coating that need to be removed by a nail salon, please have this removed prior to surgery. Artificial nails or gel coating may interfere with anesthesia's ability to adequately monitor your vital signs. ? ?[facility] is not responsible for any belongings or valuables. .  ? ?Do NOT Smoke (Tobacco/Vaping)  24 hours prior to your procedure ? ?If you use a CPAP at night, you may bring your mask for your overnight stay. ?  ?Contacts, glasses, hearing aids, dentures or partials may not be worn into surgery, please bring cases for these belongings ?  ?For patients admitted to the hospital, discharge time will be determined by your treatment team. ?  ?Patients discharged the day of surgery will not be allowed to drive home, and someone needs to stay with them for 24 hours. ? ?NO VISITORS WILL BE ALLOWED IN PRE-OP WHERE PATIENTS ARE PREPPED FOR SURGERY.  ONLY 1 SUPPORT PERSON MAY BE PRESENT IN THE WAITING ROOM WHILE YOU ARE IN SURGERY.  IF YOU ARE TO BE ADMITTED, ONCE YOU ARE IN YOUR ROOM YOU WILL BE ALLOWED TWO (2) VISITORS. 1 (ONE) VISITOR MAY STAY OVERNIGHT BUT MUST ARRIVE TO THE ROOM BY 8pm.  Minor children may have two parents present. Special consideration for safety and communication needs will be reviewed on a case by case basis. ? ?Special instructions:   ? ?Oral Hygiene is also important to reduce your risk of infection.  Remember - BRUSH YOUR TEETH THE  MORNING OF SURGERY WITH YOUR REGULAR TOOTHPASTE ? ? ?[facility]- Preparing For Surgery ? ?Before surgery, you can play an important role. Because skin is not sterile, your skin needs to be as free of germs as possible. You can reduce the number of germs on your skin by washing with CHG (chlorahexidine gluconate) Soap before surgery.  CHG is an antiseptic cleaner which kills germs and bonds with the skin to continue killing germs even after washing.   ? ? ?Please do not use if you have an allergy to CHG  or antibacterial soaps. If your skin becomes reddened/irritated stop using the CHG.  ?Do not shave (including legs and underarms) for at least 48 hours prior to first CHG shower. It is OK to shave your face. ? ?Please follow these instructions carefully. ?  ? ? Shower the NIGHT BEFORE SURGERY and the MORNING OF SURGERY with CHG Soap.  ? If you chose to wash your hair, wash your hair first as usual with your normal shampoo. After you shampoo, rinse your hair and body thoroughly to remove the shampoo.  Then [employer] and genitals (private parts) with your normal soap and rinse thoroughly to remove soap. ? ?After that Use CHG Soap as you would any other liquid soap. You can apply CHG directly to the skin and wash gently with a scrungie or a clean washcloth.  ? ?Apply the CHG Soap to your body ONLY FROM THE NECK DOWN.  Do not use on open wounds or open sores. Avoid contact with your eyes, ears, mouth and genitals (private parts). Wash Face and genitals (private parts)  with your normal soap.  ? ?Wash thoroughly, paying special attention to the area where your surgery will be performed. ? ?Thoroughly rinse your body with warm water from the neck down. ? ?DO NOT shower/wash with your normal soap after using and rinsing off the CHG Soap. ? ?Pat yourself dry with a CLEAN TOWEL. ? ?Wear CLEAN PAJAMAS to bed the night before surgery ? ?Place CLEAN SHEETS on your bed the night before your surgery ? ?DO NOT SLEEP WITH PETS. ? ? ?Day of Surgery: ? ?Take a shower with CHG soap. ?Wear Clean/Comfortable clothing the morning of surgery ?Do not apply any deodorants/lotions.   ?Remember to brush your teeth WITH YOUR REGULAR TOOTHPASTE. ? ? ? ?COVID testing ? ?If you are going to stay overnight or be admitted after your procedure/surgery and require a pre-op COVID test, please follow these instructions after your COVID test  ? ?You are not required to quarantine however you are required to wear a well-fitting mask when you are out and  around people not in your household.  If your mask becomes wet or soiled, replace with a new one. ? ?Wash your hands often with soap and water for 20 seconds or clean your hands with an alcohol-based hand sanitizer that contains at least 60% alcohol. ? ?Do not share personal items. ? ?Notify your provider: ?if you are in close contact with someone who has COVID  ?or if you develop a fever of 100.4 or greater, sneezing, cough, sore throat, shortness of breath or body aches. ? ?  ?Please read over the following fact sheets that you were given.  ? ?

## 2021-03-24 ENCOUNTER — Inpatient Hospital Stay (HOSPITAL_COMMUNITY)
Admission: RE | Admit: 2021-03-24 | Discharge: 2021-03-24 | Disposition: A | Discharge status: Home / Self Care | Payer: 59 | Source: Ambulatory Visit

## 2021-04-01 ENCOUNTER — Ambulatory Visit: Admit: 2021-04-01 | Payer: 59 | Admitting: Orthopedic Surgery

## 2021-04-01 SURGERY — ANTERIOR CERVICAL DECOMPRESSION/DISCECTOMY FUSION 2 LEVELS
Anesthesia: General

## 2021-06-05 ENCOUNTER — Encounter: Payer: Self-pay | Admitting: Family Medicine

## 2021-06-05 ENCOUNTER — Ambulatory Visit: Payer: 59 | Admitting: Internal Medicine

## 2021-06-05 ENCOUNTER — Ambulatory Visit (INDEPENDENT_AMBULATORY_CARE_PROVIDER_SITE_OTHER): Payer: 59 | Admitting: Family Medicine

## 2021-06-05 VITALS — BP 140/96 | HR 108 | Temp 98.4°F | Ht 76.0 in | Wt 333.0 lb

## 2021-06-05 DIAGNOSIS — L739 Follicular disorder, unspecified: Secondary | ICD-10-CM | POA: Diagnosis not present

## 2021-06-05 MED ORDER — SULFAMETHOXAZOLE-TRIMETHOPRIM 800-160 MG PO TABS: 1.0000 | ORAL_TABLET | Freq: Two times a day (BID) | ORAL | 0 refills | Status: DC

## 2021-06-05 MED ORDER — TRIAMCINOLONE ACETONIDE 0.1 % EX CREA: 1.0000 "application " | TOPICAL_CREAM | Freq: Two times a day (BID) | CUTANEOUS | 0 refills | Status: DC

## 2021-06-05 NOTE — Progress Notes (Signed)
Subjective:     Patient ID: Marcus Burns, male    DOB: Jul 03, 1980, 41 y.o.   MRN: [id number]  Chief Complaint  Patient presents with   Urticaria    Itchy red hives on abdomen and left side under arm. Mentioned he has switched deodorant.  Has taken benadryl     HPI Patient is in today for a 1 week history of pruritic rash on his abdomen and lower chest wall.  States he did start using a different deodorant than usual.  He also reports using a type of soap that has peppermint in it. No one else in the household has this rash.  No bug bites.  He has been taking Benadryl and using topical hydrocortisone.  No fever, chills, headache, neck pain, chest pain, palpitations, shortness of breath, abdominal pain, nausea, vomiting or diarrhea. No rash on hands or feet  Health Maintenance Due  Topic Date Due   COVID-19 Vaccine (3 - Booster for [employer] series) 07/04/2019   OPHTHALMOLOGY EXAM  02/19/2020    Past Medical History:  Diagnosis Date   Acute prostatitis 06/19/2007   ALLERGIC RHINITIS 06/19/2007   ANXIETY 06/19/2007   ELEVATED BLOOD PRESSURE WITHOUT DIAGNOSIS OF HYPERTENSION 06/19/2007   GERD 06/19/2007   GERD (gastroesophageal reflux disease)    History of COVID-19 01/24/2019   History of kidney stones    Inguinal hernia    Migraines    NEPHROLITHIASIS, HX OF 06/19/2007    Past Surgical History:  Procedure Laterality Date   CYSTOSCOPY WITH RETROGRADE PYELOGRAM, URETEROSCOPY AND STENT PLACEMENT Bilateral 02/09/2019   Procedure: CYSTOSCOPY WITH RETROGRADE PYELOGRAM, DIAGNOSTIC URETEROSCOPY AND STENT PLACEMENT;  Surgeon: Sebastian Ache, MD;  Location: [facility];  Service: Urology;  Laterality: Bilateral;  75 MINS   CYSTOSCOPY WITH RETROGRADE PYELOGRAM, URETEROSCOPY AND STENT PLACEMENT Bilateral 02/23/2019   Procedure: CYSTOSCOPY WITH RETROGRADE PYELOGRAM, URETEROSCOPY AND STENT PLACEMENT;  Surgeon: Sebastian Ache, MD;  Location: [facility];  Service: Urology;  Laterality: Bilateral;   1 HR   HERNIA REPAIR     HOLMIUM LASER APPLICATION Bilateral 02/23/2019   Procedure: HOLMIUM LASER APPLICATION;  Surgeon: Sebastian Ache, MD;  Location: [facility];  Service: Urology;  Laterality: Bilateral;    Family History  Problem Relation Age of Onset   Coronary artery disease Father    Anxiety disorder Mother    Anxiety disorder Brother    Diabetes Brother     Social History   Socioeconomic History   Marital status: Married    Spouse name: Not on file   Number of children: Not on file   Years of education: Not on file   Highest education level: Not on file  Occupational History   Not on file  Tobacco Use   Smoking status: Former   Smokeless tobacco: Former  Building services engineer Use: Never used  Substance and Sexual Activity   Alcohol use: No   Drug use: No   Sexual activity: Not on file  Other Topics Concern   Not on file  Social History Narrative   Not on file   Social Determinants of Health   Financial Resource Strain: Not on file  Food Insecurity: Not on file  Transportation Needs: Not on file  Physical Activity: Not on file  Stress: Not on file  Social Connections: Not on file  Intimate Partner Violence: Not on file    Outpatient Medications Prior to Visit  Medication Sig Dispense Refill   acetaminophen (TYLENOL) 500 MG tablet Take 1,000  mg by mouth every 6 (six) hours as needed (pain.).     ALPRAZolam (XANAX) 0.5 MG tablet TAKE 1 TABLET(0.5 MG) BY MOUTH AT BEDTIME AS NEEDED FOR SLEEP OR ANXIETY 30 tablet 2   APPLE CIDER VINEGAR PO Take 1 capsule by mouth daily.     cetirizine (ZYRTEC) 10 MG tablet Take 10 mg by mouth daily as needed for allergies.     Cholecalciferol (VITAMIN D) 50 MCG (2000 UT) tablet Take 2,000 Units by mouth daily.     diclofenac Sodium (VOLTAREN) 1 % GEL Apply 4 g topically 4 (four) times daily. 100 g 0   diphenhydrAMINE (BENADRYL) 25 MG tablet Take 50 mg by mouth daily as needed for allergies.     gabapentin (NEURONTIN) 300 MG  capsule Take 1 capsule (300 mg total) by mouth 3 (three) times daily. (Patient taking differently: Take 300 mg by mouth 3 (three) times daily as needed (pain.).) 90 capsule 3   HYDROcodone-acetaminophen (NORCO) 7.5-325 MG tablet Take 1 tablet by mouth every 6 (six) hours as needed for moderate pain. 30 tablet 0   ibuprofen (ADVIL) 200 MG tablet Take 600 mg by mouth every 8 (eight) hours as needed (for pain.).     Melatonin 10 MG TBDP Take 10 mg by mouth every other day. At night (sleep aid)     Multiple Vitamin (MULTIVITAMIN WITH MINERALS) TABS tablet Take 1 tablet by mouth at bedtime.     zolpidem (AMBIEN) 10 MG tablet Take 1 tablet (10 mg total) by mouth at bedtime as needed for sleep. 30 tablet 1   No facility-administered medications prior to visit.    Allergies  Allergen Reactions   Doxycycline Other (See Comments)    Light headed , passed out    ROS Pertinent positives and negatives in the history of present illness.     Objective:    Physical Exam Constitutional:      General: He is not in acute distress.    Appearance: He is not ill-appearing.  HENT:     Mouth/Throat:     Lips: Pink.     Mouth: Mucous membranes are moist.     Pharynx: Oropharynx is clear.  Cardiovascular:     Rate and Rhythm: Normal rate and regular rhythm.  Pulmonary:     Effort: Pulmonary effort is normal.     Breath sounds: Normal breath sounds.  Musculoskeletal:     Cervical back: Normal range of motion.  Skin:    Findings: Rash present.     Comments: Pruritic clusters and discrete small, pus filled bumps along hair follicles of his anterior lower chest and abdomen. Some excoriation noted.   Neurological:     Mental Status: He is alert.    BP (!) 140/96 (BP Location: Right Arm, Patient Position: Sitting, Cuff Size: Large)   Pulse (!) 108   Temp 98.4 F (36.9 C) (Temporal)   Ht 6\' 4"  (1.93 m)   Wt (!) 333 lb (151 kg)   SpO2 98%   BMI 40.53 kg/m  Wt Readings from Last 3 Encounters:   06/05/21 (!) 333 lb (151 kg)  01/01/21 (!) 340 lb (154.2 kg)  12/22/20 (!) 310 lb (140.6 kg)       Assessment & Plan:   Problem List Items Addressed This Visit   None Visit Diagnoses     Folliculitis    -  Primary   Relevant Medications   sulfamethoxazole-trimethoprim (BACTRIM DS) 800-160 MG tablet   triamcinolone cream (KENALOG) 0.1 %  Bactrim prescribed due to allergy to doxycycline.  Topical triamcinolone prescribed.  Recommend he go back to the deodorant he was using prior to the rash.  We also discussed using Dial soap.  He is wearing a spandex type undershirt today and he works out and this as well.  Advised him to avoid tight fitting clothing and change tops after exercise or sweating.  Follow-up if worsening or not back to baseline when he completes the antibiotic.  I am having Larena Glassman. Crapps start on sulfamethoxazole-trimethoprim and triamcinolone cream. I am also having him maintain his diphenhydrAMINE, Vitamin D, APPLE CIDER VINEGAR PO, multivitamin with minerals, cetirizine, ALPRAZolam, diclofenac Sodium, HYDROcodone-acetaminophen, zolpidem, gabapentin, Melatonin, ibuprofen, and acetaminophen.  Meds ordered this encounter  Medications   sulfamethoxazole-trimethoprim (BACTRIM DS) 800-160 MG tablet    Sig: Take 1 tablet by mouth 2 (two) times daily.    Dispense:  20 tablet    Refill:  0    Order Specific Question:   Supervising Provider    Answer:   Hillard Danker A [[id number]]   triamcinolone cream (KENALOG) 0.1 %    Sig: Apply 1 application. topically 2 (two) times daily.    Dispense:  30 g    Refill:  0    Order Specific Question:   Supervising Provider    Answer:   Hillard Danker A [[id number]]

## 2021-06-05 NOTE — Patient Instructions (Signed)
Take the antibiotic and use the steroid cream as prescribed. Never use steroid cream longer than 2 weeks  I recommend using Dial soap on your torso and extremities and a milder soap on your face and genital area.  Continue using Benadryl at bedtime if needed.  Cool compresses for itching  Follow-up if you are getting worse or not significantly better in the next week.   Folliculitis  Folliculitis is inflammation of the hair follicles. Folliculitis most commonly occurs on the scalp, thighs, legs, back, and buttocks. However, it can occur anywhere on the body. What are the causes? This condition may be caused by: A bacterial infection (common). A fungal infection. A viral infection. Contact with certain chemicals, especially oils and tars. Shaving or waxing. Greasy ointments or creams applied to the skin. Long-lasting folliculitis and folliculitis that keeps coming back may be caused by bacteria. This bacteria can live anywhere on your skin and is often found in the nostrils. What increases the risk? You are more likely to develop this condition if you have: A weakened immune system. Diabetes. Obesity. What are the signs or symptoms? Symptoms of this condition include: Redness. Soreness. Swelling. Itching. Small white or yellow, pus-filled, itchy spots (pustules) that appear over a reddened area. If there is an infection that goes deep into the follicle, these may develop into a boil (furuncle). A group of closely packed boils (carbuncle). These tend to form in hairy, sweaty areas of the body. How is this diagnosed? This condition is diagnosed with a skin exam. To find what is causing the condition, your health care provider may take a sample of one of the pustules or boils for testing in a lab. How is this treated? This condition may be treated by: Applying warm compresses to the affected areas. Taking an antibiotic medicine or applying an antibiotic medicine to the  skin. Applying or bathing with an antiseptic solution. Taking an over-the-counter medicine to help with itching. Having a procedure to drain any pustules or boils. This may be done if a pustule or boil contains a lot of pus or fluid. Having laser hair removal. This may be done to treat long-lasting folliculitis. Follow these instructions at home: Managing pain and swelling  If directed, apply heat to the affected area as often as told by your health care provider. Use the heat source that your health care provider recommends, such as a moist heat pack or a heating pad. Place a towel between your skin and the heat source. Leave the heat on for 20-30 minutes. Remove the heat if your skin turns bright red. This is especially important if you are unable to feel pain, heat, or cold. You may have a greater risk of getting burned. General instructions If you were prescribed an antibiotic medicine, take it or apply it as told by your health care provider. Do not stop using the antibiotic even if your condition improves. Check the irritated area every day for signs of infection. Check for: Redness, swelling, or pain. Fluid or blood. Warmth. Pus or a bad smell. Do not shave irritated skin. Take over-the-counter and prescription medicines only as told by your health care provider. Keep all follow-up visits as told by your health care provider. This is important. Get help right away if: You have more redness, swelling, or pain in the affected area. Red streaks are spreading from the affected area. You have a fever. Summary Folliculitis is inflammation of the hair follicles. Folliculitis most commonly occurs on the scalp,  thighs, legs, back, and buttocks. This condition may be treated by taking an antibiotic medicine or applying an antibiotic medicine to the skin, and applying or bathing with an antiseptic solution. If you were prescribed an antibiotic medicine, take it or apply it as told by your  health care provider. Do not stop using the antibiotic even if your condition improves. Get help right away if you have new or worsening symptoms. Keep all follow-up visits as told by your health care provider. This is important. This information is not intended to replace advice given to you by your health care provider. Make sure you discuss any questions you have with your health care provider. Document Revised: 10/16/2020 Document Reviewed: 10/16/2020 Elsevier Patient Education  2023 [employer].

## 2021-06-30 ENCOUNTER — Telehealth: Payer: Self-pay | Admitting: Internal Medicine

## 2021-06-30 NOTE — Telephone Encounter (Signed)
Pt stated he would like Prednisone for rash on torso,chest,leg.      Please Advise

## 2021-07-02 ENCOUNTER — Ambulatory Visit (INDEPENDENT_AMBULATORY_CARE_PROVIDER_SITE_OTHER): Payer: 59 | Admitting: Internal Medicine

## 2021-07-02 VITALS — BP 134/72 | HR 93 | Temp 99.0°F | Ht 76.0 in | Wt 331.5 lb

## 2021-07-02 DIAGNOSIS — E6609 Other obesity due to excess calories: Secondary | ICD-10-CM

## 2021-07-02 DIAGNOSIS — T7840XA Allergy, unspecified, initial encounter: Secondary | ICD-10-CM | POA: Diagnosis not present

## 2021-07-02 DIAGNOSIS — R21 Rash and other nonspecific skin eruption: Secondary | ICD-10-CM | POA: Diagnosis not present

## 2021-07-02 DIAGNOSIS — E559 Vitamin D deficiency, unspecified: Secondary | ICD-10-CM | POA: Diagnosis not present

## 2021-07-02 DIAGNOSIS — E7800 Pure hypercholesterolemia, unspecified: Secondary | ICD-10-CM

## 2021-07-02 DIAGNOSIS — R739 Hyperglycemia, unspecified: Secondary | ICD-10-CM

## 2021-07-02 DIAGNOSIS — R030 Elevated blood-pressure reading, without diagnosis of hypertension: Secondary | ICD-10-CM

## 2021-07-02 MED ORDER — CLOTRIMAZOLE-BETAMETHASONE 1-0.05 % EX CREA: 1.0000 | TOPICAL_CREAM | Freq: Every day | CUTANEOUS | 0 refills | Status: DC

## 2021-07-02 MED ORDER — CEPHALEXIN 500 MG PO CAPS: 500.0000 mg | ORAL_CAPSULE | Freq: Three times a day (TID) | ORAL | 0 refills | Status: DC

## 2021-07-02 MED ORDER — METHYLPREDNISOLONE ACETATE 80 MG/ML IJ SUSP
80.0000 mg | Freq: Once | INTRAMUSCULAR | Status: AC
  Administered 2021-07-02: 80 mg via INTRAMUSCULAR

## 2021-07-02 MED ORDER — PREDNISONE 10 MG PO TABS: ORAL_TABLET | ORAL | 0 refills | Status: DC

## 2021-07-02 NOTE — Patient Instructions (Signed)
You had the steroid shot today  Please take all new medication as prescribed - the prednisone, antibiotic, and new cream as needed  Please call if your insurance will pay for [facility] for wt loss (you may need to contact your HR department)  Please continue all other medications as before, and refills have been done if requested.  Please have the pharmacy call with any other refills you may need.  Please continue your efforts at being more active, low cholesterol diet, and weight control.  Please keep your appointments with your specialists as you may have planned - dermatology later this year  Please make an Appointment to return in 6 months, or sooner if needed

## 2021-07-02 NOTE — Progress Notes (Signed)
Patient ID: Marcus Burns, male   DOB: 1980-02-13, 41 y.o.   MRN: [id number]        Chief Complaint: follow up rash, low vit d, hld, obesity       HPI:  Marcus Burns is a 41 y.o. male here with c/o 2 wk onset rash to chest and abdomen, left leg primarily with intense itching at times for unclear reason; no fever, chills, trauma, but changed soap to dial, but did not help.  Did start to become more painful to the mid abdoment with scratchnig last 2 days.  Not taking vit d.  Trying to follow lower chol diet but could be better.   Has not been able to lose significant wt recently though has been working on overall less calories.  Pt denies chest pain, increased sob or doe, wheezing, orthopnea, PND, increased LE swelling, palpitations, dizziness or syncope.   Pt denies polydipsia, polyuria, or new focal neuro s/s.           Wt Readings from Last 3 Encounters:  07/02/21 (!) 331 lb 8 oz (150.4 kg)  06/05/21 (!) 333 lb (151 kg)  01/01/21 (!) 340 lb (154.2 kg)   BP Readings from Last 3 Encounters:  07/02/21 134/72  06/05/21 (!) 140/96  01/01/21 136/80         Past Medical History:  Diagnosis Date   Acute prostatitis 06/19/2007   ALLERGIC RHINITIS 06/19/2007   ANXIETY 06/19/2007   ELEVATED BLOOD PRESSURE WITHOUT DIAGNOSIS OF HYPERTENSION 06/19/2007   GERD 06/19/2007   GERD (gastroesophageal reflux disease)    History of COVID-19 01/24/2019   History of kidney stones    Inguinal hernia    Migraines    NEPHROLITHIASIS, HX OF 06/19/2007   Past Surgical History:  Procedure Laterality Date   CYSTOSCOPY WITH RETROGRADE PYELOGRAM, URETEROSCOPY AND STENT PLACEMENT Bilateral 02/09/2019   Procedure: CYSTOSCOPY WITH RETROGRADE PYELOGRAM, DIAGNOSTIC URETEROSCOPY AND STENT PLACEMENT;  Surgeon: Sebastian Ache, MD;  Location: [facility];  Service: Urology;  Laterality: Bilateral;  75 MINS   CYSTOSCOPY WITH RETROGRADE PYELOGRAM, URETEROSCOPY AND STENT PLACEMENT Bilateral 02/23/2019   Procedure: CYSTOSCOPY  WITH RETROGRADE PYELOGRAM, URETEROSCOPY AND STENT PLACEMENT;  Surgeon: Sebastian Ache, MD;  Location: [facility];  Service: Urology;  Laterality: Bilateral;  1 HR   HERNIA REPAIR     HOLMIUM LASER APPLICATION Bilateral 02/23/2019   Procedure: HOLMIUM LASER APPLICATION;  Surgeon: Sebastian Ache, MD;  Location: [facility];  Service: Urology;  Laterality: Bilateral;    reports that he has quit smoking. He has quit using smokeless tobacco. He reports that he does not drink alcohol and does not use drugs. family history includes Anxiety disorder in his brother and mother; Coronary artery disease in his father; Diabetes in his brother. Allergies  Allergen Reactions   Doxycycline Other (See Comments)    Light headed , passed out   Current Outpatient Medications on File Prior to Visit  Medication Sig Dispense Refill   acetaminophen (TYLENOL) 500 MG tablet Take 1,000 mg by mouth every 6 (six) hours as needed (pain.).     ALPRAZolam (XANAX) 0.5 MG tablet TAKE 1 TABLET(0.5 MG) BY MOUTH AT BEDTIME AS NEEDED FOR SLEEP OR ANXIETY 30 tablet 2   APPLE CIDER VINEGAR PO Take 1 capsule by mouth daily.     cetirizine (ZYRTEC) 10 MG tablet Take 10 mg by mouth daily as needed for allergies.     Cholecalciferol (VITAMIN D) 50 MCG (2000 UT) tablet Take 2,000 Units by mouth  daily.     diclofenac Sodium (VOLTAREN) 1 % GEL Apply 4 g topically 4 (four) times daily. 100 g 0   diphenhydrAMINE (BENADRYL) 25 MG tablet Take 50 mg by mouth daily as needed for allergies.     gabapentin (NEURONTIN) 300 MG capsule Take 1 capsule (300 mg total) by mouth 3 (three) times daily. (Patient taking differently: Take 300 mg by mouth 3 (three) times daily as needed (pain.).) 90 capsule 3   HYDROcodone-acetaminophen (NORCO) 7.5-325 MG tablet Take 1 tablet by mouth every 6 (six) hours as needed for moderate pain. 30 tablet 0   ibuprofen (ADVIL) 200 MG tablet Take 600 mg by mouth every 8 (eight) hours as needed (for pain.).     Melatonin 10 MG TBDP  Take 10 mg by mouth every other day. At night (sleep aid)     Multiple Vitamin (MULTIVITAMIN WITH MINERALS) TABS tablet Take 1 tablet by mouth at bedtime.     sulfamethoxazole-trimethoprim (BACTRIM DS) 800-160 MG tablet Take 1 tablet by mouth 2 (two) times daily. 20 tablet 0   triamcinolone cream (KENALOG) 0.1 % Apply 1 application. topically 2 (two) times daily. 30 g 0   zolpidem (AMBIEN) 10 MG tablet Take 1 tablet (10 mg total) by mouth at bedtime as needed for sleep. 30 tablet 1   [DISCONTINUED] triamcinolone (NASACORT) 55 MCG/ACT AERO nasal inhaler Place 2 sprays into the nose daily. (Patient not taking: Reported on 02/01/2019) 1 Inhaler 12   No current facility-administered medications on file prior to visit.        ROS:  All others reviewed and negative.  Objective        PE:  BP 134/72   Pulse 93   Temp 99 F (37.2 C) (Oral)   Ht 6\' 4"  (1.93 m)   Wt (!) 331 lb 8 oz (150.4 kg)   SpO2 96%   BMI 40.35 kg/m                 Constitutional: Pt appears in NAD               HENT: Head: NCAT.                Right Ear: External ear normal.                 Left Ear: External ear normal.                Eyes: . Pupils are equal, round, and reactive to light. Conjunctivae and EOM are normal               Nose: without d/c or deformity               Neck: Neck supple. Gross normal ROM               Cardiovascular: Normal rate and regular rhythm.                 Pulmonary/Chest: Effort normal and breath sounds without rales or wheezing.                Abd:  Soft, NT, ND, + BS, no organomegaly               Neurological: Pt is alert. At baseline orientation, motor grossly intact               Skin:  LE edema - none; has intense erythem rash to bilateral chest and abdomen primarily with  mild tender mid abdomen               Psychiatric: Pt behavior is normal without agitation   Micro: none  Cardiac tracings I have personally interpreted today:  none  Pertinent Radiological findings  (summarize): none   Lab Results  Component Value Date   WBC 7.2 01/01/2021   HGB 13.5 01/01/2021   HCT 41.0 01/01/2021   PLT 266.0 01/01/2021   GLUCOSE 90 01/01/2021   CHOL 228 (H) 01/01/2021   TRIG 144.0 01/01/2021   HDL 48.40 01/01/2021   LDLDIRECT 121.0 10/17/2019   LDLCALC 150 (H) 01/01/2021   ALT 21 01/01/2021   AST 14 01/01/2021   NA 138 01/01/2021   K 4.5 01/01/2021   CL 101 01/01/2021   CREATININE 0.94 01/01/2021   BUN 15 01/01/2021   CO2 32 01/01/2021   TSH 1.85 01/01/2021   PSA 0.83 01/01/2021   HGBA1C 6.3 01/01/2021   MICROALBUR <0.7 01/01/2021   Assessment/Plan:  ELLIS MEHAFFEY is a 42 y.o. Black or African American [2] male with  has a past medical history of Acute prostatitis (06/19/2007), ALLERGIC RHINITIS (06/19/2007), ANXIETY (06/19/2007), ELEVATED BLOOD PRESSURE WITHOUT DIAGNOSIS OF HYPERTENSION (06/19/2007), GERD (06/19/2007), GERD (gastroesophageal reflux disease), History of COVID-19 (01/24/2019), History of kidney stones, Inguinal hernia, Migraines, and NEPHROLITHIASIS, HX OF (06/19/2007).  Rash C/w most likely allergic dermatitis but cant r/o cellulitis mid abdomen as well; for change soap to dove, change detergents as well, avoid allergens if possible, depomedrol IM 80 mg today, prednisone taper, cephalexin course, and change triam cream to lotrisone prn  Hyperglycemia Lab Results  Component Value Date   HGBA1C 6.3 01/01/2021   Stable, pt to continue current medical treatment  - diet, wt control, excercise   ELEVATED BLOOD PRESSURE WITHOUT DIAGNOSIS OF HYPERTENSION BP Readings from Last 3 Encounters:  07/02/21 134/72  06/05/21 (!) 140/96  01/01/21 136/80   Stable, pt to continue medical treatment  -diet, wt control, activity increase   Obesity Morbid obesity - ok for wegovy asd if ok with insurance  Hyperlipidemia Lab Results  Component Value Date   LDLCALC 150 (H) 01/01/2021   uncontrolled, pt to continue current low chol diet, declines  statin for now   Vitamin D deficiency Last vitamin D Lab Results  Component Value Date   VD25OH 15.51 (L) 01/01/2021   Low, to start oral replacement  Followup: Return in about 6 months (around 01/01/2022).  Oliver Barre, MD 07/05/2021 2:01 PM [facility] [place] [facility]

## 2021-07-05 ENCOUNTER — Encounter: Payer: Self-pay | Admitting: Internal Medicine

## 2021-07-05 DIAGNOSIS — R21 Rash and other nonspecific skin eruption: Secondary | ICD-10-CM | POA: Insufficient documentation

## 2021-07-05 NOTE — Assessment & Plan Note (Signed)
BP Readings from Last 3 Encounters:  07/02/21 134/72  06/05/21 (!) 140/96  01/01/21 136/80   Stable, pt to continue medical treatment  -diet, wt control, activity increase

## 2021-07-05 NOTE — Assessment & Plan Note (Signed)
Lab Results  Component Value Date   LDLCALC 150 (H) 01/01/2021   uncontrolled, pt to continue current low chol diet, declines statin for now

## 2021-07-05 NOTE — Assessment & Plan Note (Signed)
C/w most likely allergic dermatitis but cant r/o cellulitis mid abdomen as well; for change soap to dove, change detergents as well, avoid allergens if possible, depomedrol IM 80 mg today, prednisone taper, cephalexin course, and change triam cream to lotrisone prn

## 2021-07-05 NOTE — Assessment & Plan Note (Signed)
Last vitamin D Lab Results  Component Value Date   VD25OH 15.51 (L) 01/01/2021   Low, to start oral replacement  

## 2021-07-05 NOTE — Assessment & Plan Note (Signed)
Morbid obesity - ok for wegovy asd if ok with insurance

## 2021-07-05 NOTE — Assessment & Plan Note (Signed)
Lab Results  Component Value Date   HGBA1C 6.3 01/01/2021   Stable, pt to continue current medical treatment  - diet, wt control, excercise  

## 2021-12-24 ENCOUNTER — Encounter: Payer: Self-pay | Admitting: Internal Medicine

## 2021-12-24 ENCOUNTER — Ambulatory Visit (INDEPENDENT_AMBULATORY_CARE_PROVIDER_SITE_OTHER): Payer: 59 | Admitting: Internal Medicine

## 2021-12-24 ENCOUNTER — Telehealth: Payer: Self-pay | Admitting: Internal Medicine

## 2021-12-24 VITALS — BP 144/90 | HR 81 | Temp 98.1°F | Ht 76.0 in | Wt 337.0 lb

## 2021-12-24 DIAGNOSIS — R051 Acute cough: Secondary | ICD-10-CM | POA: Diagnosis not present

## 2021-12-24 DIAGNOSIS — R062 Wheezing: Secondary | ICD-10-CM | POA: Insufficient documentation

## 2021-12-24 DIAGNOSIS — E7800 Pure hypercholesterolemia, unspecified: Secondary | ICD-10-CM

## 2021-12-24 DIAGNOSIS — E559 Vitamin D deficiency, unspecified: Secondary | ICD-10-CM | POA: Diagnosis not present

## 2021-12-24 DIAGNOSIS — R059 Cough, unspecified: Secondary | ICD-10-CM | POA: Insufficient documentation

## 2021-12-24 DIAGNOSIS — N200 Calculus of kidney: Secondary | ICD-10-CM

## 2021-12-24 DIAGNOSIS — Z125 Encounter for screening for malignant neoplasm of prostate: Secondary | ICD-10-CM

## 2021-12-24 DIAGNOSIS — E538 Deficiency of other specified B group vitamins: Secondary | ICD-10-CM

## 2021-12-24 DIAGNOSIS — R739 Hyperglycemia, unspecified: Secondary | ICD-10-CM

## 2021-12-24 LAB — POC INFLUENZA A&B (BINAX/QUICKVUE)
Influenza A, POC: NEGATIVE
Influenza B, POC: NEGATIVE

## 2021-12-24 LAB — POC SOFIA SARS ANTIGEN FIA: SARS Coronavirus 2 Ag: NEGATIVE

## 2021-12-24 LAB — POCT RESPIRATORY SYNCYTIAL VIRUS: RSV Rapid Ag: NEGATIVE

## 2021-12-24 MED ORDER — AZITHROMYCIN 250 MG PO TABS: ORAL_TABLET | ORAL | 1 refills | Status: AC

## 2021-12-24 MED ORDER — HYDROCODONE BIT-HOMATROP MBR 5-1.5 MG/5ML PO SOLN: 5.0000 mL | Freq: Four times a day (QID) | ORAL | 0 refills | Status: DC | PRN

## 2021-12-24 MED ORDER — HYDROCODONE BIT-HOMATROP MBR 5-1.5 MG/5ML PO SOLN: 5.0000 mL | Freq: Four times a day (QID) | ORAL | 0 refills | Status: AC | PRN

## 2021-12-24 MED ORDER — ALBUTEROL SULFATE HFA 108 (90 BASE) MCG/ACT IN AERS: 2.0000 | INHALATION_SPRAY | Freq: Four times a day (QID) | RESPIRATORY_TRACT | 1 refills | Status: DC | PRN

## 2021-12-24 MED ORDER — PREDNISONE 10 MG PO TABS: ORAL_TABLET | ORAL | 0 refills | Status: DC

## 2021-12-24 MED ORDER — DOXYCYCLINE HYCLATE 100 MG PO TABS: 100.0000 mg | ORAL_TABLET | Freq: Two times a day (BID) | ORAL | 0 refills | Status: DC

## 2021-12-24 NOTE — Assessment & Plan Note (Signed)
Lab Results  Component Value Date   LDLCALC 150 (H) 01/01/2021   Uncontrolled, pt for lower chol diet, declines statin for now

## 2021-12-24 NOTE — Assessment & Plan Note (Signed)
Incidentally passed last wk, no current pain or symptoms, pt to f/u urology as planned

## 2021-12-24 NOTE — Patient Instructions (Signed)
Your COVID, Flu and RSV testing is negative  Please take all new medication as prescribed - the antibiotic, cough medicine, prednisone, and inhaler  Please stay hydrated to help the kidney stones too  Please continue all other medications as before, and refills have been done if requested.  Please have the pharmacy call with any other refills you may need.  Please keep your appointments with your specialists as you may have planned  Please go to the LAB at the blood drawing area for the tests to be done - such as at the [facility] before Dec 29  Please make an Appointment to return in Dec 29, or sooner if needed

## 2021-12-24 NOTE — Telephone Encounter (Signed)
error 

## 2021-12-24 NOTE — Telephone Encounter (Signed)
Ok this is done 

## 2021-12-24 NOTE — Progress Notes (Signed)
Patient ID: Marcus Burns, male   DOB: 08-23-1980, 41 y.o.   MRN: [id number]        Chief Complaint: follow up cough, wheezing, right renal stone pain, hyperglycemia       HPI:  Marcus Burns is a 41 y.o. male Here with acute onset mild to mod 6 days ST, HA, general weakness and malaise, with prod cough greenish sputum, but Pt denies chest pain, orthopnea, PND, increased LE swelling, palpitations, dizziness or syncope, except for mild wheezing, sob as well in last 2 days   Pt denies polydipsia, polyuria, or new focal neuro s/s.    Pt denies fever, wt loss, night sweats, loss of appetite, or other constitutional symptoms  Denies urinary symptoms such as dysuria, frequency, urgency, hematuria or n/v, fever, chills, but did have right flank pain with radiation to the RLQ now resolved after passed small renal stone.  Has f/u appt here incidentally dec 29       Wt Readings from Last 3 Encounters:  12/24/21 (!) 337 lb (152.9 kg)  07/02/21 (!) 331 lb 8 oz (150.4 kg)  06/05/21 (!) 333 lb (151 kg)   BP Readings from Last 3 Encounters:  12/24/21 (!) 144/90  07/02/21 134/72  06/05/21 (!) 140/96         Past Medical History:  Diagnosis Date   Acute prostatitis 06/19/2007   ALLERGIC RHINITIS 06/19/2007   ANXIETY 06/19/2007   ELEVATED BLOOD PRESSURE WITHOUT DIAGNOSIS OF HYPERTENSION 06/19/2007   GERD 06/19/2007   GERD (gastroesophageal reflux disease)    History of COVID-19 01/24/2019   History of kidney stones    Inguinal hernia    Migraines    NEPHROLITHIASIS, HX OF 06/19/2007   Past Surgical History:  Procedure Laterality Date   CYSTOSCOPY WITH RETROGRADE PYELOGRAM, URETEROSCOPY AND STENT PLACEMENT Bilateral 02/09/2019   Procedure: CYSTOSCOPY WITH RETROGRADE PYELOGRAM, DIAGNOSTIC URETEROSCOPY AND STENT PLACEMENT;  Surgeon: Sebastian Ache, MD;  Location: [facility];  Service: Urology;  Laterality: Bilateral;  75 MINS   CYSTOSCOPY WITH RETROGRADE PYELOGRAM, URETEROSCOPY AND STENT PLACEMENT  Bilateral 02/23/2019   Procedure: CYSTOSCOPY WITH RETROGRADE PYELOGRAM, URETEROSCOPY AND STENT PLACEMENT;  Surgeon: Sebastian Ache, MD;  Location: [facility];  Service: Urology;  Laterality: Bilateral;  1 HR   HERNIA REPAIR     HOLMIUM LASER APPLICATION Bilateral 02/23/2019   Procedure: HOLMIUM LASER APPLICATION;  Surgeon: Sebastian Ache, MD;  Location: [facility];  Service: Urology;  Laterality: Bilateral;    reports that he has quit smoking. He has quit using smokeless tobacco. He reports that he does not drink alcohol and does not use drugs. family history includes Anxiety disorder in his brother and mother; Coronary artery disease in his father; Diabetes in his brother. Allergies  Allergen Reactions   Doxycycline Other (See Comments)    Light headed , passed out   Current Outpatient Medications on File Prior to Visit  Medication Sig Dispense Refill   acetaminophen (TYLENOL) 500 MG tablet Take 1,000 mg by mouth every 6 (six) hours as needed (pain.).     ALPRAZolam (XANAX) 0.5 MG tablet TAKE 1 TABLET(0.5 MG) BY MOUTH AT BEDTIME AS NEEDED FOR SLEEP OR ANXIETY 30 tablet 2   APPLE CIDER VINEGAR PO Take 1 capsule by mouth daily.     cephALEXin (KEFLEX) 500 MG capsule Take 1 capsule (500 mg total) by mouth 3 (three) times daily. 30 capsule 0   cetirizine (ZYRTEC) 10 MG tablet Take 10 mg by mouth daily as needed for allergies.  Cholecalciferol (VITAMIN D) 50 MCG (2000 UT) tablet Take 2,000 Units by mouth daily.     clotrimazole-betamethasone (LOTRISONE) cream Apply 1 Application topically daily. 30 g 0   diclofenac Sodium (VOLTAREN) 1 % GEL Apply 4 g topically 4 (four) times daily. 100 g 0   diphenhydrAMINE (BENADRYL) 25 MG tablet Take 50 mg by mouth daily as needed for allergies.     gabapentin (NEURONTIN) 300 MG capsule Take 1 capsule (300 mg total) by mouth 3 (three) times daily. (Patient taking differently: Take 300 mg by mouth 3 (three) times daily as needed (pain.).) 90 capsule 3    HYDROcodone-acetaminophen (NORCO) 7.5-325 MG tablet Take 1 tablet by mouth every 6 (six) hours as needed for moderate pain. 30 tablet 0   ibuprofen (ADVIL) 200 MG tablet Take 600 mg by mouth every 8 (eight) hours as needed (for pain.).     Melatonin 10 MG TBDP Take 10 mg by mouth every other day. At night (sleep aid)     Multiple Vitamin (MULTIVITAMIN WITH MINERALS) TABS tablet Take 1 tablet by mouth at bedtime.     sulfamethoxazole-trimethoprim (BACTRIM DS) 800-160 MG tablet Take 1 tablet by mouth 2 (two) times daily. 20 tablet 0   triamcinolone cream (KENALOG) 0.1 % Apply 1 application. topically 2 (two) times daily. 30 g 0   zolpidem (AMBIEN) 10 MG tablet Take 1 tablet (10 mg total) by mouth at bedtime as needed for sleep. 30 tablet 1   [DISCONTINUED] triamcinolone (NASACORT) 55 MCG/ACT AERO nasal inhaler Place 2 sprays into the nose daily. (Patient not taking: Reported on 02/01/2019) 1 Inhaler 12   No current facility-administered medications on file prior to visit.        ROS:  All others reviewed and negative.  Objective        PE:  BP (!) 144/90 (BP Location: Left Arm, Patient Position: Sitting, Cuff Size: Large)   Pulse 81   Temp 98.1 F (36.7 C) (Oral)   Ht 6\' 4"  (1.93 m)   Wt (!) 337 lb (152.9 kg)   SpO2 94%   BMI 41.02 kg/m                 Constitutional: Pt appears in NAD, mild ill, morbid obese               HENT: Head: NCAT.                Right Ear: External ear normal.                 Left Ear: External ear normal.  Bilat tm's with mild erythema.  Max sinus areas mild tender.  Pharynx with mild erythema, no exudate                Eyes: . Pupils are equal, round, and reactive to light. Conjunctivae and EOM are normal               Nose: without d/c or deformity               Neck: Neck supple. Gross normal ROM               Cardiovascular: Normal rate and regular rhythm.                 Pulmonary/Chest: Effort normal and breath sounds without rales but with few mild  bilateral wheezing.                Abd:  Soft, NT, ND, + BS, no organomegaly               Neurological: Pt is alert. At baseline orientation, motor grossly intact               Skin: Skin is warm. No rashes, no other new lesions, LE edema - none               Psychiatric: Pt behavior is normal without agitation   Micro: none  Cardiac tracings I have personally interpreted today:  none  Pertinent Radiological findings (summarize): none   Lab Results  Component Value Date   WBC 7.2 01/01/2021   HGB 13.5 01/01/2021   HCT 41.0 01/01/2021   PLT 266.0 01/01/2021   GLUCOSE 90 01/01/2021   CHOL 228 (H) 01/01/2021   TRIG 144.0 01/01/2021   HDL 48.40 01/01/2021   LDLDIRECT 121.0 10/17/2019   LDLCALC 150 (H) 01/01/2021   ALT 21 01/01/2021   AST 14 01/01/2021   NA 138 01/01/2021   K 4.5 01/01/2021   CL 101 01/01/2021   CREATININE 0.94 01/01/2021   BUN 15 01/01/2021   CO2 32 01/01/2021   TSH 1.85 01/01/2021   PSA 0.83 01/01/2021   HGBA1C 6.3 01/01/2021   MICROALBUR <0.7 01/01/2021   POCT today - COVID neg, Flu - neg, RSV - neg  Assessment/Plan:  Marcus Burns is a 41 y.o. Black or African American [2] male with  has a past medical history of Acute prostatitis (06/19/2007), ALLERGIC RHINITIS (06/19/2007), ANXIETY (06/19/2007), ELEVATED BLOOD PRESSURE WITHOUT DIAGNOSIS OF HYPERTENSION (06/19/2007), GERD (06/19/2007), GERD (gastroesophageal reflux disease), History of COVID-19 (01/24/2019), History of kidney stones, Inguinal hernia, Migraines, and NEPHROLITHIASIS, HX OF (06/19/2007).  Hyperglycemia Lab Results  Component Value Date   HGBA1C 6.3 01/01/2021   Stable, pt to continue current medical treatment  - diet, wt control, excercise   Hyperlipidemia Lab Results  Component Value Date   LDLCALC 150 (H) 01/01/2021   Uncontrolled, pt for lower chol diet, declines statin for now   Vitamin D deficiency Last vitamin D Lab Results  Component Value Date   VD25OH 15.51 (L)  01/01/2021   Low, reminded to start oral replacement   Cough Mild to mod, c/w bronchitis vs pna, declines cxr,  for antibx course, cough med prn,  to f/u any worsening symptoms or concerns   Right renal stone Incidentally passed last wk, no current pain or symptoms, pt to f/u urology as planned  Wheezing Mild, c/w bronchospasm most likely, for prednisone taper and inhaler prn,  to f/u any worsening symptoms or concerns  Followup: Return in about 8 days (around 01/01/2022).  Oliver Barre, MD 12/24/2021 12:54 PM [facility] [place] [facility]

## 2021-12-24 NOTE — Assessment & Plan Note (Signed)
Mild to mod, c/w bronchitis vs pna, declines cxr, for antibx course, cough med prn,  to f/u any worsening symptoms or concerns 

## 2021-12-24 NOTE — Assessment & Plan Note (Signed)
Mild, c/w bronchospasm most likely, for prednisone taper and inhaler prn,  to f/u any worsening symptoms or concerns

## 2021-12-24 NOTE — Telephone Encounter (Signed)
Please send cough syrup to [facility] on [address] as previous pharmacy has it on back order.

## 2021-12-24 NOTE — Assessment & Plan Note (Signed)
Last vitamin D Lab Results  Component Value Date   VD25OH 15.51 (L) 01/01/2021   Low, reminded to start oral replacement

## 2021-12-24 NOTE — Assessment & Plan Note (Signed)
Lab Results  Component Value Date   HGBA1C 6.3 01/01/2021   Stable, pt to continue current medical treatment  - diet, wt control, excercise

## 2021-12-24 NOTE — Addendum Note (Signed)
Addended by: Corwin Levins on: 12/24/2021 01:48 PM   Modules accepted: Orders

## 2022-01-01 ENCOUNTER — Ambulatory Visit: Payer: 59 | Admitting: Internal Medicine

## 2022-01-22 ENCOUNTER — Ambulatory Visit (INDEPENDENT_AMBULATORY_CARE_PROVIDER_SITE_OTHER): Payer: 59 | Admitting: Internal Medicine

## 2022-01-22 VITALS — BP 150/80 | HR 94 | Temp 97.9°F | Ht 76.0 in | Wt 338.0 lb

## 2022-01-22 DIAGNOSIS — Z125 Encounter for screening for malignant neoplasm of prostate: Secondary | ICD-10-CM

## 2022-01-22 DIAGNOSIS — E6609 Other obesity due to excess calories: Secondary | ICD-10-CM

## 2022-01-22 DIAGNOSIS — R739 Hyperglycemia, unspecified: Secondary | ICD-10-CM | POA: Diagnosis not present

## 2022-01-22 DIAGNOSIS — R030 Elevated blood-pressure reading, without diagnosis of hypertension: Secondary | ICD-10-CM

## 2022-01-22 DIAGNOSIS — E538 Deficiency of other specified B group vitamins: Secondary | ICD-10-CM | POA: Diagnosis not present

## 2022-01-22 DIAGNOSIS — F411 Generalized anxiety disorder: Secondary | ICD-10-CM

## 2022-01-22 DIAGNOSIS — Z0001 Encounter for general adult medical examination with abnormal findings: Secondary | ICD-10-CM | POA: Diagnosis not present

## 2022-01-22 DIAGNOSIS — E7800 Pure hypercholesterolemia, unspecified: Secondary | ICD-10-CM | POA: Diagnosis not present

## 2022-01-22 DIAGNOSIS — E559 Vitamin D deficiency, unspecified: Secondary | ICD-10-CM

## 2022-01-22 LAB — URINALYSIS, ROUTINE W REFLEX MICROSCOPIC
Bilirubin Urine: NEGATIVE
Ketones, ur: NEGATIVE
Leukocytes,Ua: NEGATIVE
Nitrite: NEGATIVE
Specific Gravity, Urine: <=1.005 — AB (ref 1.000–1.030)
Total Protein, Urine: NEGATIVE
Urine Glucose: NEGATIVE
Urobilinogen, UA: 0.2 (ref 0.0–1.0)
pH: 6 (ref 5.0–8.0)

## 2022-01-22 LAB — BASIC METABOLIC PANEL
BUN: 12 mg/dL (ref 6–23)
CO2: 28 mEq/L (ref 19–32)
Calcium: 9.8 mg/dL (ref 8.4–10.5)
Chloride: 101 mEq/L (ref 96–112)
Creatinine, Ser: 1.16 mg/dL (ref 0.40–1.50)
GFR: 77.59 mL/min (ref >60.00)
Glucose, Bld: 103 mg/dL — ABNORMAL HIGH (ref 70–99)
Potassium: 4 mEq/L (ref 3.5–5.1)
Sodium: 140 mEq/L (ref 135–145)

## 2022-01-22 LAB — CBC WITH DIFFERENTIAL/PLATELET
Basophils Absolute: 0 10*3/uL (ref 0.0–0.1)
Basophils Relative: 0.7 % (ref 0.0–3.0)
Eosinophils Absolute: 0.1 10*3/uL (ref 0.0–0.7)
Eosinophils Relative: 2 % (ref 0.0–5.0)
HCT: 39.7 % (ref 39.0–52.0)
Hemoglobin: 13.6 g/dL (ref 13.0–17.0)
Lymphocytes Relative: 28.7 % (ref 12.0–46.0)
Lymphs Abs: 1.5 10*3/uL (ref 0.7–4.0)
MCHC: 34.4 g/dL (ref 30.0–36.0)
MCV: 86 fl (ref 78.0–100.0)
Monocytes Absolute: 0.4 10*3/uL (ref 0.1–1.0)
Monocytes Relative: 7 % (ref 3.0–12.0)
Neutro Abs: 3.3 10*3/uL (ref 1.4–7.7)
Neutrophils Relative %: 61.6 % (ref 43.0–77.0)
Platelets: 312 10*3/uL (ref 150.0–400.0)
RBC: 4.61 Mil/uL (ref 4.22–5.81)
RDW: 14.9 % (ref 11.5–15.5)
WBC: 5.4 10*3/uL (ref 4.0–10.5)

## 2022-01-22 LAB — VITAMIN D 25 HYDROXY (VIT D DEFICIENCY, FRACTURES): VITD: 10.7 ng/mL — ABNORMAL LOW (ref 30.00–100.00)

## 2022-01-22 LAB — TSH: TSH: 1.99 u[IU]/mL (ref 0.35–5.50)

## 2022-01-22 LAB — LDL CHOLESTEROL, DIRECT: Direct LDL: 129 mg/dL

## 2022-01-22 LAB — HEPATIC FUNCTION PANEL
ALT: 18 U/L (ref 0–53)
AST: 18 U/L (ref 0–37)
Albumin: 4.3 g/dL (ref 3.5–5.2)
Alkaline Phosphatase: 80 U/L (ref 39–117)
Bilirubin, Direct: 0.1 mg/dL (ref 0.0–0.3)
Total Bilirubin: 0.8 mg/dL (ref 0.2–1.2)
Total Protein: 7.8 g/dL (ref 6.0–8.3)

## 2022-01-22 LAB — LIPID PANEL
Cholesterol: 229 mg/dL — ABNORMAL HIGH (ref 0–200)
HDL: 38.2 mg/dL — ABNORMAL LOW (ref >39.00)
NonHDL: 191.1
Total CHOL/HDL Ratio: 6
Triglycerides: 329 mg/dL — ABNORMAL HIGH (ref 0.0–149.0)
VLDL: 65.8 mg/dL — ABNORMAL HIGH (ref 0.0–40.0)

## 2022-01-22 LAB — VITAMIN B12: Vitamin B-12: 681 pg/mL (ref 211–911)

## 2022-01-22 LAB — PSA: PSA: 0.82 ng/mL (ref 0.10–4.00)

## 2022-01-22 LAB — HEMOGLOBIN A1C: Hgb A1c MFr Bld: 6.4 % (ref 4.6–6.5)

## 2022-01-22 MED ORDER — ALPRAZOLAM 0.5 MG PO TABS: ORAL_TABLET | ORAL | 5 refills | Status: DC

## 2022-01-22 NOTE — Progress Notes (Signed)
Patient ID: Marcus Burns, male   DOB: 10-30-1980, 42 y.o.   MRN: [id number]         Chief Complaint:: wellness exam and anxiety, elevated BP, hyperglycemia, hld, low vit d, obesity       HPI:  Marcus Burns is a 42 y.o. male here for wellness exam; has optho appt for next month, decliens covid booster and flu shot, o/w up to date                        Also Pt denies chest pain, increased sob or doe, wheezing, orthopnea, PND, increased LE swelling, palpitations, dizziness or syncope.   Pt denies polydipsia, polyuria, or new focal neuro s/s.    Pt denies fever, wt loss, night sweats, loss of appetite, or other constitutional symptoms  .Denies worsening depressive symptoms, suicidal ideation, or panic; has ongoing anxiety, increase recenlty having run out of xanax, delcines SSRI trial or need for referral.  Pt also adamant BP at home is controled < 140/90, gets nervous here.    Wt Readings from Last 3 Encounters:  01/22/22 (!) 338 lb (153.3 kg)  12/24/21 (!) 337 lb (152.9 kg)  07/02/21 (!) 331 lb 8 oz (150.4 kg)   BP Readings from Last 3 Encounters:  01/22/22 (!) 150/80  12/24/21 (!) 144/90  07/02/21 134/72   Immunization History  Administered Date(s) Administered   Influenza Split 10/21/2010   Influenza, Seasonal, Injecte, Preservative Fre 01/14/2012   Influenza,inj,Quad PF,6+ Mos 11/19/2017, 11/29/2018, 01/01/2021   Influenza-Unspecified 11/05/2014, 11/07/2016   PFIZER(Purple Top)SARS-COV-2 Vaccination 04/17/2019, 05/09/2019   Pneumococcal Polysaccharide-23 05/13/2017   Td 02/05/2008   Tdap 09/15/2018   Health Maintenance Due  Topic Date Due   OPHTHALMOLOGY EXAM  02/19/2020   Diabetic kidney evaluation - Urine ACR  01/01/2022      Past Medical History:  Diagnosis Date   Acute prostatitis 06/19/2007   ALLERGIC RHINITIS 06/19/2007   ANXIETY 06/19/2007   ELEVATED BLOOD PRESSURE WITHOUT DIAGNOSIS OF HYPERTENSION 06/19/2007   GERD 06/19/2007   GERD (gastroesophageal reflux  disease)    History of COVID-19 01/24/2019   History of kidney stones    Inguinal hernia    Migraines    NEPHROLITHIASIS, HX OF 06/19/2007   Past Surgical History:  Procedure Laterality Date   CYSTOSCOPY WITH RETROGRADE PYELOGRAM, URETEROSCOPY AND STENT PLACEMENT Bilateral 02/09/2019   Procedure: CYSTOSCOPY WITH RETROGRADE PYELOGRAM, DIAGNOSTIC URETEROSCOPY AND STENT PLACEMENT;  Surgeon: Sebastian Ache, MD;  Location: [facility];  Service: Urology;  Laterality: Bilateral;  75 MINS   CYSTOSCOPY WITH RETROGRADE PYELOGRAM, URETEROSCOPY AND STENT PLACEMENT Bilateral 02/23/2019   Procedure: CYSTOSCOPY WITH RETROGRADE PYELOGRAM, URETEROSCOPY AND STENT PLACEMENT;  Surgeon: Sebastian Ache, MD;  Location: [facility];  Service: Urology;  Laterality: Bilateral;  1 HR   HERNIA REPAIR     HOLMIUM LASER APPLICATION Bilateral 02/23/2019   Procedure: HOLMIUM LASER APPLICATION;  Surgeon: Sebastian Ache, MD;  Location: [facility];  Service: Urology;  Laterality: Bilateral;    reports that he has quit smoking. He has quit using smokeless tobacco. He reports that he does not drink alcohol and does not use drugs. family history includes Anxiety disorder in his brother and mother; Coronary artery disease in his father; Diabetes in his brother. Allergies  Allergen Reactions   Doxycycline Other (See Comments)    Light headed , passed out   Current Outpatient Medications on File Prior to Visit  Medication Sig Dispense Refill   acetaminophen (TYLENOL)  500 MG tablet Take 1,000 mg by mouth every 6 (six) hours as needed (pain.).     albuterol (VENTOLIN HFA) 108 (90 Base) MCG/ACT inhaler Inhale 2 puffs into the lungs every 6 (six) hours as needed for wheezing or shortness of breath. 8 g 1   APPLE CIDER VINEGAR PO Take 1 capsule by mouth daily.     cetirizine (ZYRTEC) 10 MG tablet Take 10 mg by mouth daily as needed for allergies.     Cholecalciferol (VITAMIN D) 50 MCG (2000 UT) tablet Take 2,000 Units by mouth daily.      clotrimazole-betamethasone (LOTRISONE) cream Apply 1 Application topically daily. 30 g 0   diphenhydrAMINE (BENADRYL) 25 MG tablet Take 50 mg by mouth daily as needed for allergies.     gabapentin (NEURONTIN) 300 MG capsule Take 1 capsule (300 mg total) by mouth 3 (three) times daily. (Patient taking differently: Take 300 mg by mouth 3 (three) times daily as needed (pain.).) 90 capsule 3   Multiple Vitamin (MULTIVITAMIN WITH MINERALS) TABS tablet Take 1 tablet by mouth at bedtime.     cephALEXin (KEFLEX) 500 MG capsule Take 1 capsule (500 mg total) by mouth 3 (three) times daily. (Patient not taking: Reported on 01/22/2022) 30 capsule 0   diclofenac Sodium (VOLTAREN) 1 % GEL Apply 4 g topically 4 (four) times daily. (Patient not taking: Reported on 01/22/2022) 100 g 0   HYDROcodone-acetaminophen (NORCO) 7.5-325 MG tablet Take 1 tablet by mouth every 6 (six) hours as needed for moderate pain. (Patient not taking: Reported on 01/22/2022) 30 tablet 0   HYDROMET 5-1.5 MG/5ML syrup Take 5 mLs by mouth every 6 (six) hours as needed. (Patient not taking: Reported on 01/22/2022)     ibuprofen (ADVIL) 200 MG tablet Take 600 mg by mouth every 8 (eight) hours as needed (for pain.). (Patient not taking: Reported on 01/22/2022)     Melatonin 10 MG TBDP Take 10 mg by mouth every other day. At night (sleep aid) (Patient not taking: Reported on 01/22/2022)     predniSONE (DELTASONE) 10 MG tablet 3 tabs by mouth per day for 3 days,2tabs per day for 3 days,1tab per day for 3 days (Patient not taking: Reported on 01/22/2022) 18 tablet 0   sulfamethoxazole-trimethoprim (BACTRIM DS) 800-160 MG tablet Take 1 tablet by mouth 2 (two) times daily. (Patient not taking: Reported on 01/22/2022) 20 tablet 0   triamcinolone cream (KENALOG) 0.1 % Apply 1 application. topically 2 (two) times daily. (Patient not taking: Reported on 01/22/2022) 30 g 0   zolpidem (AMBIEN) 10 MG tablet Take 1 tablet (10 mg total) by mouth at bedtime as needed for  sleep. (Patient not taking: Reported on 01/22/2022) 30 tablet 1   [DISCONTINUED] triamcinolone (NASACORT) 55 MCG/ACT AERO nasal inhaler Place 2 sprays into the nose daily. (Patient not taking: Reported on 02/01/2019) 1 Inhaler 12   No current facility-administered medications on file prior to visit.        ROS:  All others reviewed and negative.  Objective        PE:  BP (!) 150/80 (BP Location: Left Arm, Patient Position: Sitting, Cuff Size: Large)   Pulse 94   Temp 97.9 F (36.6 C) (Oral)   Ht 6\' 4"  (1.93 m)   Wt (!) 338 lb (153.3 kg)   SpO2 96%   BMI 41.14 kg/m                 Constitutional: Pt appears in NAD  HENT: Head: NCAT.                Right Ear: External ear normal.                 Left Ear: External ear normal.                Eyes: . Pupils are equal, round, and reactive to light. Conjunctivae and EOM are normal               Nose: without d/c or deformity               Neck: Neck supple. Gross normal ROM               Cardiovascular: Normal rate and regular rhythm.                 Pulmonary/Chest: Effort normal and breath sounds without rales or wheezing.                Abd:  Soft, NT, ND, + BS, no organomegaly               Neurological: Pt is alert. At baseline orientation, motor grossly intact               Skin: Skin is warm. No rashes, no other new lesions, LE edema - none               Psychiatric: Pt behavior is normal without agitation , anxious   Micro: none  Cardiac tracings I have personally interpreted today:  none  Pertinent Radiological findings (summarize): none   Lab Results  Component Value Date   WBC 5.4 01/22/2022   HGB 13.6 01/22/2022   HCT 39.7 01/22/2022   PLT 312.0 01/22/2022   GLUCOSE 103 (H) 01/22/2022   CHOL 229 (H) 01/22/2022   TRIG 329.0 (H) 01/22/2022   HDL 38.20 (L) 01/22/2022   LDLDIRECT 129.0 01/22/2022   LDLCALC 150 (H) 01/01/2021   ALT 18 01/22/2022   AST 18 01/22/2022   NA 140 01/22/2022   K 4.0  01/22/2022   CL 101 01/22/2022   CREATININE 1.16 01/22/2022   BUN 12 01/22/2022   CO2 28 01/22/2022   TSH 1.99 01/22/2022   PSA 0.82 01/22/2022   HGBA1C 6.4 01/22/2022   MICROALBUR <0.7 01/01/2021   Assessment/Plan:  HAWK MONES is a 42 y.o. Black or African American [2] male with  has a past medical history of Acute prostatitis (06/19/2007), ALLERGIC RHINITIS (06/19/2007), ANXIETY (06/19/2007), ELEVATED BLOOD PRESSURE WITHOUT DIAGNOSIS OF HYPERTENSION (06/19/2007), GERD (06/19/2007), GERD (gastroesophageal reflux disease), History of COVID-19 (01/24/2019), History of kidney stones, Inguinal hernia, Migraines, and NEPHROLITHIASIS, HX OF (06/19/2007).  Vitamin D deficiency Last vitamin D Lab Results  Component Value Date   VD25OH 15.51 (L) 01/01/2021   Low, to start oral replacement   Encounter for well adult exam with abnormal findings Age and sex appropriate education and counseling updated with regular exercise and diet Referrals for preventative services - for optho appt next month as scheduled Immunizations addressed - declines covid booster and flu shot Smoking counseling  - none needed Evidence for depression or other mood disorder - anxiety worsening, for xanax refill Most recent labs reviewed. I have personally reviewed and have noted: 1) the patient's medical and social history 2) The patient's current medications and supplements 3) The patient's height, weight, and BMI have been recorded in the chart   Anxiety state Mild worsening recenlty, for  restart xanax prn, declines trial ssri or referral  ELEVATED BLOOD PRESSURE WITHOUT DIAGNOSIS OF HYPERTENSION Pt adamant BP at home is controlled but uncontrolled here  BP Readings from Last 3 Encounters:  01/22/22 (!) 150/80  12/24/21 (!) 144/90  07/02/21 134/72    pt to continue medical treatment  - diet, wt control   Hyperglycemia Lab Results  Component Value Date   HGBA1C 6.4 01/22/2022   Stable, pt to  continue current medical treatment  - diet, wt control   Hyperlipidemia Lab Results  Component Value Date   [place] 150 (H) 01/01/2021   Uncontrolled, pt to continue current lower chol diet, declines statin fo rnow   Obesity Conts to worsen, consider wegovy but declines for now  Followup: Return in about 6 months (around 07/23/2022).  Cathlean Cower, MD 01/23/2022 9:33 PM [facility]

## 2022-01-22 NOTE — Patient Instructions (Signed)
Please continue all other medications as before, and refills have been done if requested - xanax  Please have the pharmacy call with any other refills you may need.  Please continue your efforts at being more active, low cholesterol diet, and weight control.  You are otherwise up to date with prevention measures today.  Please keep your appointments with your specialists as you may have planned  Please go to the LAB at the blood drawing area for the tests to be done  You will be contacted by phone if any changes need to be made immediately.  Otherwise, you will receive a letter about your results with an explanation, but please check with MyChart first.  Please remember to sign up for MyChart if you have not done so, as this will be important to you in the future with finding out test results, communicating by private email, and scheduling acute appointments online when needed.  Please make an Appointment to return in 6 months, or sooner if needed

## 2022-01-22 NOTE — Assessment & Plan Note (Signed)
Last vitamin D Lab Results  Component Value Date   VD25OH 15.51 (L) 01/01/2021   Low, to start oral replacement

## 2022-01-23 ENCOUNTER — Encounter: Payer: Self-pay | Admitting: Internal Medicine

## 2022-01-23 NOTE — Assessment & Plan Note (Signed)
Conts to worsen, consider wegovy but declines for now

## 2022-01-23 NOTE — Assessment & Plan Note (Signed)
Lab Results  Component Value Date   LDLCALC 150 (H) 01/01/2021   Uncontrolled, pt to continue current lower chol diet, declines statin fo rnow

## 2022-01-23 NOTE — Assessment & Plan Note (Signed)
Age and sex appropriate education and counseling updated with regular exercise and diet Referrals for preventative services - for optho appt next month as scheduled Immunizations addressed - declines covid booster and flu shot Smoking counseling  - none needed Evidence for depression or other mood disorder - anxiety worsening, for xanax refill Most recent labs reviewed. I have personally reviewed and have noted: 1) the patient's medical and social history 2) The patient's current medications and supplements 3) The patient's height, weight, and BMI have been recorded in the chart

## 2022-01-23 NOTE — Assessment & Plan Note (Signed)
Mild worsening recenlty, for restart xanax prn, declines trial ssri or referral

## 2022-01-23 NOTE — Assessment & Plan Note (Signed)
Pt adamant BP at home is controlled but uncontrolled here  BP Readings from Last 3 Encounters:  01/22/22 (!) 150/80  12/24/21 (!) 144/90  07/02/21 134/72    pt to continue medical treatment  - diet, wt control

## 2022-01-23 NOTE — Assessment & Plan Note (Signed)
Lab Results  Component Value Date   HGBA1C 6.4 01/22/2022   Stable, pt to continue current medical treatment  - diet, wt control

## 2022-01-29 ENCOUNTER — Ambulatory Visit: Payer: 59 | Admitting: Internal Medicine

## 2022-03-03 ENCOUNTER — Other Ambulatory Visit: Payer: Self-pay | Admitting: Urology

## 2022-03-05 ENCOUNTER — Other Ambulatory Visit: Payer: Self-pay | Admitting: Urology

## 2022-03-10 ENCOUNTER — Other Ambulatory Visit: Payer: Self-pay | Admitting: Internal Medicine

## 2022-03-10 ENCOUNTER — Encounter: Payer: Self-pay | Admitting: Internal Medicine

## 2022-03-10 ENCOUNTER — Ambulatory Visit (INDEPENDENT_AMBULATORY_CARE_PROVIDER_SITE_OTHER): Payer: 59 | Admitting: Internal Medicine

## 2022-03-10 ENCOUNTER — Ambulatory Visit (INDEPENDENT_AMBULATORY_CARE_PROVIDER_SITE_OTHER): Payer: 59

## 2022-03-10 VITALS — BP 142/88 | HR 96 | Temp 99.1°F | Ht 76.0 in | Wt 314.0 lb

## 2022-03-10 DIAGNOSIS — N2889 Other specified disorders of kidney and ureter: Secondary | ICD-10-CM | POA: Diagnosis not present

## 2022-03-10 DIAGNOSIS — R0781 Pleurodynia: Secondary | ICD-10-CM

## 2022-03-10 DIAGNOSIS — R030 Elevated blood-pressure reading, without diagnosis of hypertension: Secondary | ICD-10-CM

## 2022-03-10 DIAGNOSIS — R7989 Other specified abnormal findings of blood chemistry: Secondary | ICD-10-CM

## 2022-03-10 DIAGNOSIS — R0600 Dyspnea, unspecified: Secondary | ICD-10-CM | POA: Diagnosis not present

## 2022-03-10 DIAGNOSIS — F411 Generalized anxiety disorder: Secondary | ICD-10-CM | POA: Diagnosis not present

## 2022-03-10 LAB — HEPATIC FUNCTION PANEL
ALT: 11 U/L (ref 0–53)
AST: 14 U/L (ref 0–37)
Albumin: 4 g/dL (ref 3.5–5.2)
Alkaline Phosphatase: 79 U/L (ref 39–117)
Bilirubin, Direct: 0.2 mg/dL (ref 0.0–0.3)
Total Bilirubin: 1 mg/dL (ref 0.2–1.2)
Total Protein: 8.4 g/dL — ABNORMAL HIGH (ref 6.0–8.3)

## 2022-03-10 LAB — BASIC METABOLIC PANEL
BUN: 11 mg/dL (ref 6–23)
CO2: 29 mEq/L (ref 19–32)
Calcium: 10.4 mg/dL (ref 8.4–10.5)
Chloride: 102 mEq/L (ref 96–112)
Creatinine, Ser: 1.25 mg/dL (ref 0.40–1.50)
GFR: 70.87 mL/min (ref >60.00)
Glucose, Bld: 101 mg/dL — ABNORMAL HIGH (ref 70–99)
Potassium: 4.3 mEq/L (ref 3.5–5.1)
Sodium: 141 mEq/L (ref 135–145)

## 2022-03-10 LAB — CBC WITH DIFFERENTIAL/PLATELET
Basophils Absolute: 0 10*3/uL (ref 0.0–0.1)
Basophils Relative: 0.3 % (ref 0.0–3.0)
Eosinophils Absolute: 0.1 10*3/uL (ref 0.0–0.7)
Eosinophils Relative: 1.4 % (ref 0.0–5.0)
HCT: 40.1 % (ref 39.0–52.0)
Hemoglobin: 13.5 g/dL (ref 13.0–17.0)
Lymphocytes Relative: 15 % (ref 12.0–46.0)
Lymphs Abs: 1.1 10*3/uL (ref 0.7–4.0)
MCHC: 33.8 g/dL (ref 30.0–36.0)
MCV: 85.6 fl (ref 78.0–100.0)
Monocytes Absolute: 0.5 10*3/uL (ref 0.1–1.0)
Monocytes Relative: 7.2 % (ref 3.0–12.0)
Neutro Abs: 5.5 10*3/uL (ref 1.4–7.7)
Neutrophils Relative %: 76.1 % (ref 43.0–77.0)
Platelets: 310 10*3/uL (ref 150.0–400.0)
RBC: 4.68 Mil/uL (ref 4.22–5.81)
RDW: 14.7 % (ref 11.5–15.5)
WBC: 7.2 10*3/uL (ref 4.0–10.5)

## 2022-03-10 LAB — D-DIMER, QUANTITATIVE: D-Dimer, Quant: 5.38 mcg/mL FEU — ABNORMAL HIGH (ref <0.50)

## 2022-03-10 MED ORDER — CLONAZEPAM 0.5 MG PO TABS: 0.5000 mg | ORAL_TABLET | Freq: Two times a day (BID) | ORAL | 1 refills | Status: DC | PRN

## 2022-03-10 MED ORDER — LEVOFLOXACIN 500 MG PO TABS: 500.0000 mg | ORAL_TABLET | Freq: Every day | ORAL | 0 refills | Status: DC

## 2022-03-10 NOTE — Patient Instructions (Addendum)
Ok to change the xanax to klonopin twice per day as needed  Please continue all other medications as before, and refills have been done if requested.  Please have the pharmacy call with any other refills you may need.  Please keep your appointments with your specialists as you may have planned  Please go to the XRAY Department in the first floor for the x-ray testing  Please go to the LAB at the blood drawing area for the tests to be done  You will be contacted by phone if any changes need to be made immediately.  Otherwise, you will receive a letter about your results with an explanation, but please check with MyChart first.  Please remember to sign up for MyChart if you have not done so, as this will be important to you in the future with finding out test results, communicating by private email, and scheduling acute appointments online when needed.  Emmit Alexanders with your Surgury on Mar 20!

## 2022-03-10 NOTE — Progress Notes (Signed)
Patient ID: Marcus Burns, male   DOB: September 24, 1980, 42 y.o.   MRN: [id number]        Chief Complaint: follow up recent findng right renal mass for surgury mar 20, anxiety, mild dypspnea, right lateral pleuritic CP, elevated BP       HPI:  Marcus Burns is a 42 y.o. male here with c/o new finding right renal mass, appears probable malignant per pt (CT done per urology, no results here) and due for right nephrectomy on Mar 20 per pt.  Pt has ongoing mild anxiety improved overall recently and not taking the xanax only rarely, but now with no appetite, worry about surgury, recent wt loss and several panic attacks.  Also with c/o 3-4 days mild intermittent sharp but concerning right lateral pleuritic CP without unusual physical activity or new exercise.  Conts to work for [employer] at home mostly sitting for the work day.  Has been associated with SOB but not sure if that has to do with anxiety.   No hx of DVT PE but now has known probable right renal malignancy.          Wt Readings from Last 3 Encounters:  03/11/22 (!) 315 lb 14.7 oz (143.3 kg)  03/10/22 (!) 314 lb (142.4 kg)  01/22/22 (!) 338 lb (153.3 kg)   BP Readings from Last 3 Encounters:  03/12/22 (!) 146/94  03/10/22 (!) 142/88  01/22/22 (!) 150/80         Past Medical History:  Diagnosis Date   Acute prostatitis 06/19/2007   ALLERGIC RHINITIS 06/19/2007   ANXIETY 06/19/2007   ELEVATED BLOOD PRESSURE WITHOUT DIAGNOSIS OF HYPERTENSION 06/19/2007   GERD 06/19/2007   GERD (gastroesophageal reflux disease)    History of COVID-19 01/24/2019   History of kidney stones    Inguinal hernia    Migraines    NEPHROLITHIASIS, HX OF 06/19/2007   Past Surgical History:  Procedure Laterality Date   CYSTOSCOPY WITH RETROGRADE PYELOGRAM, URETEROSCOPY AND STENT PLACEMENT Bilateral 02/09/2019   Procedure: CYSTOSCOPY WITH RETROGRADE PYELOGRAM, DIAGNOSTIC URETEROSCOPY AND STENT PLACEMENT;  Surgeon: Alexis Frock, MD;  Location: [facility];  Service: Urology;   Laterality: Bilateral;  55 MINS   CYSTOSCOPY WITH RETROGRADE PYELOGRAM, URETEROSCOPY AND STENT PLACEMENT Bilateral 02/23/2019   Procedure: CYSTOSCOPY WITH RETROGRADE PYELOGRAM, URETEROSCOPY AND STENT PLACEMENT;  Surgeon: Alexis Frock, MD;  Location: [facility];  Service: Urology;  Laterality: Bilateral;  1 HR   HERNIA REPAIR     HOLMIUM LASER APPLICATION Bilateral 01/23/4172   Procedure: HOLMIUM LASER APPLICATION;  Surgeon: Alexis Frock, MD;  Location: [facility];  Service: Urology;  Laterality: Bilateral;    reports that he has quit smoking. He has quit using smokeless tobacco. He reports that he does not drink alcohol and does not use drugs. family history includes Anxiety disorder in his brother and mother; Coronary artery disease in his father; Diabetes in his brother. Allergies  Allergen Reactions   Doxycycline Other (See Comments)    Light headed , passed out   No current facility-administered medications on file prior to visit.   Current Outpatient Medications on File Prior to Visit  Medication Sig Dispense Refill   albuterol (VENTOLIN HFA) 108 (90 Base) MCG/ACT inhaler Inhale 2 puffs into the lungs every 6 (six) hours as needed for wheezing or shortness of breath. 8 g 1   amLODipine (NORVASC) 2.5 MG tablet Take by mouth.     APPLE CIDER VINEGAR PO Take 1 capsule by mouth daily.  cetirizine (ZYRTEC) 10 MG tablet Take 10 mg by mouth daily as needed for allergies.     Cholecalciferol (VITAMIN D) 50 MCG (2000 UT) tablet Take 2,000 Units by mouth daily.     acetaminophen (TYLENOL) 500 MG tablet Take 1,000 mg by mouth every 6 (six) hours as needed (pain.). (Patient not taking: Reported on 03/10/2022)     clotrimazole-betamethasone (LOTRISONE) cream Apply 1 Application topically daily. (Patient not taking: Reported on 03/10/2022) 30 g 0   diclofenac Sodium (VOLTAREN) 1 % GEL Apply 4 g topically 4 (four) times daily. (Patient not taking: Reported on 01/22/2022) 100 g 0   diphenhydrAMINE  (BENADRYL) 25 MG tablet Take 50 mg by mouth daily as needed for allergies. (Patient not taking: Reported on 03/10/2022)     gabapentin (NEURONTIN) 300 MG capsule Take 1 capsule (300 mg total) by mouth 3 (three) times daily. (Patient not taking: Reported on 03/10/2022) 90 capsule 3   HYDROcodone-acetaminophen (NORCO) 7.5-325 MG tablet Take 1 tablet by mouth every 6 (six) hours as needed for moderate pain. (Patient not taking: Reported on 01/22/2022) 30 tablet 0   HYDROMET 5-1.5 MG/5ML syrup Take 5 mLs by mouth every 6 (six) hours as needed. (Patient not taking: Reported on 01/22/2022)     ibuprofen (ADVIL) 200 MG tablet Take 600 mg by mouth every 8 (eight) hours as needed (for pain.). (Patient not taking: Reported on 01/22/2022)     Melatonin 10 MG TBDP Take 10 mg by mouth every other day. At night (sleep aid) (Patient not taking: Reported on 01/22/2022)     Multiple Vitamin (MULTIVITAMIN WITH MINERALS) TABS tablet Take 1 tablet by mouth at bedtime. (Patient not taking: Reported on 03/10/2022)     predniSONE (DELTASONE) 10 MG tablet 3 tabs by mouth per day for 3 days,2tabs per day for 3 days,1tab per day for 3 days (Patient not taking: Reported on 01/22/2022) 18 tablet 0   sulfamethoxazole-trimethoprim (BACTRIM DS) 800-160 MG tablet Take 1 tablet by mouth 2 (two) times daily. (Patient not taking: Reported on 01/22/2022) 20 tablet 0   triamcinolone cream (KENALOG) 0.1 % Apply 1 application. topically 2 (two) times daily. (Patient not taking: Reported on 01/22/2022) 30 g 0   zolpidem (AMBIEN) 10 MG tablet Take 1 tablet (10 mg total) by mouth at bedtime as needed for sleep. (Patient not taking: Reported on 01/22/2022) 30 tablet 1   [DISCONTINUED] triamcinolone (NASACORT) 55 MCG/ACT AERO nasal inhaler Place 2 sprays into the nose daily. (Patient not taking: Reported on 02/01/2019) 1 Inhaler 12        ROS:  All others reviewed and negative.  Objective        PE:  BP (!) 142/88 (BP Location: Right Arm, Patient Position:  Sitting, Cuff Size: Large)   Pulse 96   Temp 99.1 F (37.3 C) (Oral)   Ht 6\' 4"  (1.93 m)   Wt (!) 314 lb (142.4 kg)   SpO2 96%   BMI 38.22 kg/m                 Constitutional: Pt appears non toxic               HENT: Head: NCAT.                Right Ear: External ear normal.                 Left Ear: External ear normal.  Eyes: . Pupils are equal, round, and reactive to light. Conjunctivae and EOM are normal               Nose: without d/c or deformity               Neck: Neck supple. Gross normal ROM               Cardiovascular: Normal rate and regular rhythm.                 Pulmonary/Chest: Effort normal and breath sounds without rales or wheezing.                Abd:  Soft, NT, ND, + BS, no organomegaly               Neurological: Pt is alert. At baseline orientation, motor grossly intact               Skin: Skin is warm. No rashes, no other new lesions, LE edema - none               Psychiatric: Pt behavior is normal without agitation but mod nervous  Micro: none  Cardiac tracings I have personally interpreted today:  none  Pertinent Radiological findings (summarize): none   Lab Results  Component Value Date   WBC 9.6 03/12/2022   HGB 12.2 (L) 03/12/2022   HCT 38.6 (L) 03/12/2022   PLT 260 03/12/2022   GLUCOSE 110 (H) 03/12/2022   CHOL 229 (H) 01/22/2022   TRIG 329.0 (H) 01/22/2022   HDL 38.20 (L) 01/22/2022   LDLDIRECT 129.0 01/22/2022   LDLCALC 150 (H) 01/01/2021   ALT 12 03/12/2022   AST 14 (L) 03/12/2022   NA 138 03/12/2022   K 4.0 03/12/2022   CL 101 03/12/2022   CREATININE 1.28 (H) 03/12/2022   BUN 13 03/12/2022   CO2 26 03/12/2022   TSH 1.99 01/22/2022   PSA 0.82 01/22/2022   INR 1.0 03/11/2022   HGBA1C 6.4 01/22/2022   MICROALBUR <0.7 01/01/2021   Assessment/Plan:  Marcus Burns is a 42 y.o. Black or African American [2] male with  has a past medical history of Acute prostatitis (06/19/2007), ALLERGIC RHINITIS (06/19/2007),  ANXIETY (06/19/2007), ELEVATED BLOOD PRESSURE WITHOUT DIAGNOSIS OF HYPERTENSION (06/19/2007), GERD (06/19/2007), GERD (gastroesophageal reflux disease), History of COVID-19 (01/24/2019), History of kidney stones, Inguinal hernia, Migraines, and NEPHROLITHIASIS, HX OF (06/19/2007).  Anxiety state Mild to mod worsening due to situation with upcoming surgury, now for change xanax to klnoopin 0.5 bid prn  Pleuritic chest pain Etiology unclear, for cxr r/o pna, also d dimer with labs given hx of probable malignancy and can't r/o PE  Right renal mass For surgical management mar 20 per urology  ELEVATED BLOOD PRESSURE WITHOUT DIAGNOSIS OF HYPERTENSION Mild increased today, possibly situational, ok to continue to follow with current med tx - norvasc 5 mg qd  BP Readings from Last 3 Encounters:  03/12/22 (!) 146/94  03/10/22 (!) 142/88  01/22/22 (!) 150/80    Followup: Return if symptoms worsen or fail to improve.  Cathlean Cower, MD 03/12/2022 8:30 PM [facility]

## 2022-03-11 ENCOUNTER — Inpatient Hospital Stay (HOSPITAL_COMMUNITY)
Admission: EM | Admit: 2022-03-11 | Discharge: 2022-03-14 | DRG: 175 | Disposition: A | Discharge status: Home / Self Care | Payer: 59 | Attending: Family Medicine | Admitting: Family Medicine

## 2022-03-11 ENCOUNTER — Other Ambulatory Visit: Payer: Self-pay

## 2022-03-11 ENCOUNTER — Inpatient Hospital Stay (HOSPITAL_COMMUNITY): Payer: 59

## 2022-03-11 ENCOUNTER — Encounter (HOSPITAL_COMMUNITY): Payer: Self-pay

## 2022-03-11 ENCOUNTER — Emergency Department (HOSPITAL_COMMUNITY): Payer: 59

## 2022-03-11 DIAGNOSIS — Z6838 Body mass index (BMI) 38.0-38.9, adult: Secondary | ICD-10-CM

## 2022-03-11 DIAGNOSIS — R739 Hyperglycemia, unspecified: Secondary | ICD-10-CM | POA: Diagnosis present

## 2022-03-11 DIAGNOSIS — E785 Hyperlipidemia, unspecified: Secondary | ICD-10-CM | POA: Diagnosis present

## 2022-03-11 DIAGNOSIS — I2609 Other pulmonary embolism with acute cor pulmonale: Principal | ICD-10-CM

## 2022-03-11 DIAGNOSIS — Z86711 Personal history of pulmonary embolism: Secondary | ICD-10-CM | POA: Diagnosis present

## 2022-03-11 DIAGNOSIS — J189 Pneumonia, unspecified organism: Secondary | ICD-10-CM

## 2022-03-11 DIAGNOSIS — Z8616 Personal history of COVID-19: Secondary | ICD-10-CM

## 2022-03-11 DIAGNOSIS — N2889 Other specified disorders of kidney and ureter: Secondary | ICD-10-CM | POA: Diagnosis present

## 2022-03-11 DIAGNOSIS — Z87891 Personal history of nicotine dependence: Secondary | ICD-10-CM | POA: Diagnosis not present

## 2022-03-11 DIAGNOSIS — C641 Malignant neoplasm of right kidney, except renal pelvis: Secondary | ICD-10-CM | POA: Diagnosis present

## 2022-03-11 DIAGNOSIS — Z818 Family history of other mental and behavioral disorders: Secondary | ICD-10-CM | POA: Diagnosis not present

## 2022-03-11 DIAGNOSIS — K219 Gastro-esophageal reflux disease without esophagitis: Secondary | ICD-10-CM | POA: Diagnosis present

## 2022-03-11 DIAGNOSIS — Z22322 Carrier or suspected carrier of Methicillin resistant Staphylococcus aureus: Secondary | ICD-10-CM | POA: Diagnosis not present

## 2022-03-11 DIAGNOSIS — I2699 Other pulmonary embolism without acute cor pulmonale: Principal | ICD-10-CM | POA: Diagnosis present

## 2022-03-11 DIAGNOSIS — Z8249 Family history of ischemic heart disease and other diseases of the circulatory system: Secondary | ICD-10-CM | POA: Diagnosis not present

## 2022-03-11 DIAGNOSIS — Z833 Family history of diabetes mellitus: Secondary | ICD-10-CM | POA: Diagnosis not present

## 2022-03-11 DIAGNOSIS — Z79899 Other long term (current) drug therapy: Secondary | ICD-10-CM | POA: Diagnosis not present

## 2022-03-11 DIAGNOSIS — I10 Essential (primary) hypertension: Secondary | ICD-10-CM | POA: Diagnosis present

## 2022-03-11 DIAGNOSIS — E669 Obesity, unspecified: Secondary | ICD-10-CM | POA: Diagnosis present

## 2022-03-11 LAB — COMPREHENSIVE METABOLIC PANEL
ALT: 14 U/L (ref 0–44)
AST: 18 U/L (ref 15–41)
Albumin: 3.9 g/dL (ref 3.5–5.0)
Alkaline Phosphatase: 76 U/L (ref 38–126)
Anion gap: 6 (ref 5–15)
BUN: 14 mg/dL (ref 6–20)
CO2: 26 mmol/L (ref 22–32)
Calcium: 9.4 mg/dL (ref 8.9–10.3)
Chloride: 103 mmol/L (ref 98–111)
Creatinine, Ser: 1.21 mg/dL (ref 0.61–1.24)
GFR, Estimated: >60 mL/min (ref >60)
Glucose, Bld: 113 mg/dL — ABNORMAL HIGH (ref 70–99)
Potassium: 3.5 mmol/L (ref 3.5–5.1)
Sodium: 135 mmol/L (ref 135–145)
Total Bilirubin: 0.9 mg/dL (ref 0.3–1.2)
Total Protein: 8.5 g/dL — ABNORMAL HIGH (ref 6.5–8.1)

## 2022-03-11 LAB — CBC WITH DIFFERENTIAL/PLATELET
Abs Immature Granulocytes: 0.01 10*3/uL (ref 0.00–0.07)
Basophils Absolute: 0 10*3/uL (ref 0.0–0.1)
Basophils Relative: 0 %
Eosinophils Absolute: 0.2 10*3/uL (ref 0.0–0.5)
Eosinophils Relative: 3 %
HCT: 39 % (ref 39.0–52.0)
Hemoglobin: 12.7 g/dL — ABNORMAL LOW (ref 13.0–17.0)
Immature Granulocytes: 0 %
Lymphocytes Relative: 23 %
Lymphs Abs: 1.8 10*3/uL (ref 0.7–4.0)
MCH: 28.4 pg (ref 26.0–34.0)
MCHC: 32.6 g/dL (ref 30.0–36.0)
MCV: 87.2 fL (ref 80.0–100.0)
Monocytes Absolute: 0.9 10*3/uL (ref 0.1–1.0)
Monocytes Relative: 11 %
Neutro Abs: 5.1 10*3/uL (ref 1.7–7.7)
Neutrophils Relative %: 63 %
Platelets: 277 10*3/uL (ref 150–400)
RBC: 4.47 MIL/uL (ref 4.22–5.81)
RDW: 13.7 % (ref 11.5–15.5)
WBC: 7.9 10*3/uL (ref 4.0–10.5)
nRBC: 0 % (ref 0.0–0.2)

## 2022-03-11 LAB — MAGNESIUM: Magnesium: 2.2 mg/dL (ref 1.7–2.4)

## 2022-03-11 LAB — HEPARIN LEVEL (UNFRACTIONATED)
Heparin Unfractionated: 0.44 IU/mL (ref 0.30–0.70)
Heparin Unfractionated: 0.32 IU/mL (ref 0.30–0.70)

## 2022-03-11 LAB — TROPONIN I (HIGH SENSITIVITY): Troponin I (High Sensitivity): <2 ng/L (ref <18)

## 2022-03-11 LAB — PROTIME-INR
INR: 1 (ref 0.8–1.2)
Prothrombin Time: 13.5 seconds (ref 11.4–15.2)

## 2022-03-11 LAB — ECHOCARDIOGRAM COMPLETE
AR max vel: 3.55 cm2
AV Area VTI: 3.38 cm2
AV Area mean vel: 3.39 cm2
AV Mean grad: 4 mmHg
AV Peak grad: 7.3 mmHg
Ao pk vel: 1.35 m/s
Area-P 1/2: 4.06 cm2
Height: 76 in
S' Lateral: 2.8 cm
Weight: 5054.71 oz

## 2022-03-11 LAB — MRSA NEXT GEN BY PCR, NASAL: MRSA by PCR Next Gen: DETECTED — AB

## 2022-03-11 LAB — APTT: aPTT: 30 seconds (ref 24–36)

## 2022-03-11 LAB — LACTIC ACID, PLASMA: Lactic Acid, Venous: 0.8 mmol/L (ref 0.5–1.9)

## 2022-03-11 LAB — PHOSPHORUS: Phosphorus: 4 mg/dL (ref 2.5–4.6)

## 2022-03-11 LAB — BRAIN NATRIURETIC PEPTIDE: B Natriuretic Peptide: 7.5 pg/mL (ref 0.0–100.0)

## 2022-03-11 MED ORDER — FAMOTIDINE 20 MG PO TABS
20.0000 mg | ORAL_TABLET | Freq: Two times a day (BID) | ORAL | Status: DC
  Administered 2022-03-11 – 2022-03-14 (×7): 20 mg via ORAL
  Filled 2022-03-11 (×7): qty 1

## 2022-03-11 MED ORDER — ORAL CARE MOUTH RINSE: 15.0000 mL | OROMUCOSAL | Status: DC | PRN

## 2022-03-11 MED ORDER — AMLODIPINE BESYLATE 5 MG PO TABS
5.0000 mg | ORAL_TABLET | Freq: Every day | ORAL | Status: DC
  Administered 2022-03-11 – 2022-03-13 (×3): 5 mg via ORAL
  Filled 2022-03-11 (×4): qty 1

## 2022-03-11 MED ORDER — HYDRALAZINE HCL 20 MG/ML IJ SOLN: 10.0000 mg | INTRAMUSCULAR | Status: DC | PRN

## 2022-03-11 MED ORDER — IOHEXOL 350 MG/ML SOLN
100.0000 mL | Freq: Once | INTRAVENOUS | Status: AC | PRN
  Administered 2022-03-11: 100 mL via INTRAVENOUS

## 2022-03-11 MED ORDER — CHLORHEXIDINE GLUCONATE CLOTH 2 % EX PADS
6.0000 | MEDICATED_PAD | Freq: Every day | CUTANEOUS | Status: DC
  Administered 2022-03-11 – 2022-03-14 (×3): 6 via TOPICAL

## 2022-03-11 MED ORDER — HEPARIN (PORCINE) 25000 UT/250ML-% IV SOLN
2300.0000 [IU]/h | INTRAVENOUS | Status: DC
  Administered 2022-03-11 (×2): 2000 [IU]/h via INTRAVENOUS
  Administered 2022-03-12: 2300 [IU]/h via INTRAVENOUS
  Filled 2022-03-11 (×3): qty 250

## 2022-03-11 MED ORDER — SODIUM CHLORIDE 0.9 % IV BOLUS
500.0000 mL | Freq: Once | INTRAVENOUS | Status: AC
  Administered 2022-03-11: 500 mL via INTRAVENOUS

## 2022-03-11 MED ORDER — PERFLUTREN LIPID MICROSPHERE
1.0000 mL | INTRAVENOUS | Status: AC | PRN
  Administered 2022-03-11: 3 mL via INTRAVENOUS

## 2022-03-11 MED ORDER — HEPARIN BOLUS VIA INFUSION
5000.0000 [IU] | Freq: Once | INTRAVENOUS | Status: AC
  Administered 2022-03-11: 5000 [IU] via INTRAVENOUS
  Filled 2022-03-11: qty 5000

## 2022-03-11 MED ORDER — OXYCODONE HCL 5 MG PO TABS
5.0000 mg | ORAL_TABLET | ORAL | Status: DC | PRN
  Administered 2022-03-11 – 2022-03-14 (×6): 5 mg via ORAL
  Filled 2022-03-11 (×6): qty 1

## 2022-03-11 MED ORDER — MUPIROCIN 2 % EX OINT
1.0000 | TOPICAL_OINTMENT | Freq: Two times a day (BID) | CUTANEOUS | Status: DC
  Administered 2022-03-11 – 2022-03-14 (×7): 1 via NASAL
  Filled 2022-03-11 (×3): qty 22

## 2022-03-11 MED ORDER — ACETAMINOPHEN 325 MG PO TABS
650.0000 mg | ORAL_TABLET | Freq: Four times a day (QID) | ORAL | Status: DC | PRN
  Administered 2022-03-11 – 2022-03-12 (×2): 650 mg via ORAL
  Filled 2022-03-11 (×2): qty 2

## 2022-03-11 MED ORDER — CHLORHEXIDINE GLUCONATE CLOTH 2 % EX PADS
6.0000 | MEDICATED_PAD | Freq: Every day | CUTANEOUS | Status: DC
  Administered 2022-03-11: 6 via TOPICAL

## 2022-03-11 MED ORDER — ACETAMINOPHEN 650 MG RE SUPP: 650.0000 mg | Freq: Four times a day (QID) | RECTAL | Status: DC | PRN

## 2022-03-11 MED ORDER — SODIUM CHLORIDE (PF) 0.9 % IJ SOLN
INTRAMUSCULAR | Status: AC
  Filled 2022-03-11: qty 50

## 2022-03-11 MED ORDER — MELATONIN 3 MG PO TABS: 3.0000 mg | ORAL_TABLET | Freq: Every evening | ORAL | Status: DC | PRN

## 2022-03-11 NOTE — Plan of Care (Signed)

## 2022-03-11 NOTE — Progress Notes (Signed)
BLE venous duplex has been completed   Results can be found under chart review under CV PROC. 03/11/2022 3:57 PM Ariella Voit RVT, RDMS

## 2022-03-11 NOTE — Progress Notes (Signed)
  Echocardiogram 2D Echocardiogram has been performed.  Marcus Burns 03/11/2022, 9:26 AM

## 2022-03-11 NOTE — H&P (Signed)
History and Physical    Patient: Marcus Burns [id number] DOB: Jan 22, 1980 DOA: 03/11/2022 DOS: the patient was seen and examined on 03/11/2022 PCP: Biagio Borg, MD  Patient coming from: Home  Chief Complaint:  Chief Complaint  Patient presents with   Shortness of Breath   HPI: Marcus Burns is a 42 y.o. male with medical history significant of acute Protuss titer, allergic rhinitis, anxiety, hypertension, GERD, COVID-19, nephrolithiasis, inguinal hernia, migraine headaches who presented to the emergency department with complaints of progressively worse dyspnea associated with right-sided pleuritic chest pain for the past 3 days.  He was seen by his PCP who sent him for an x-ray and a D-dimer.  His D-dimer was elevated.  CTA chest ordered to be done today, but the patient stated that he was unable to sleep due to the pain and came to the emergency department. He denied fever, chills, rhinorrhea, sore throat, wheezing or hemoptysis.  No chest pain, palpitations, diaphoresis, PND, orthopnea or pitting edema of the lower extremities.  No abdominal pain, nausea, emesis, diarrhea, constipation, melena or hematochezia.  No flank pain, dysuria, frequency or hematuria.  No polyuria, polydipsia, polyphagia or blurred vision.   ED course: Initial vital signs were temperature 98.1 F, pulse 85, respiration 15, BP 179/106 mmHg and O2 sat 100% on room air.  The patient received 500 mL of normal saline bolus and was started on a heparin infusion.  Lab work: CBC showed a white count of 7.9, hemoglobin 12.7 g/dL platelets 277.  Normal PT, INR and PTT.  Normal lactic acid, magnesium, phosphorus, troponin and BNP.  CMP showed a glucose of 113 mg/dL and total protein of 8.5 g/dL.  The rest of the CMP measurements were within normal limits.  Imaging: CTA chest showing extensive right-sided pulmonary emboli with evidence of right heart strain.  Pleural-based somewhat rounded areas of increased airspace  opacity in the right lower lobe likely representing early changes of pulmonary infarct.  There were also changes consistent with an history of right renal cell carcinoma.   Review of Systems: As mentioned in the history of present illness. All other systems reviewed and are negative.  Past Medical History:  Diagnosis Date   Acute prostatitis 06/19/2007   ALLERGIC RHINITIS 06/19/2007   ANXIETY 06/19/2007   ELEVATED BLOOD PRESSURE WITHOUT DIAGNOSIS OF HYPERTENSION 06/19/2007   GERD 06/19/2007   GERD (gastroesophageal reflux disease)    History of COVID-19 01/24/2019   History of kidney stones    Inguinal hernia    Migraines    NEPHROLITHIASIS, HX OF 06/19/2007   Past Surgical History:  Procedure Laterality Date   CYSTOSCOPY WITH RETROGRADE PYELOGRAM, URETEROSCOPY AND STENT PLACEMENT Bilateral 02/09/2019   Procedure: CYSTOSCOPY WITH RETROGRADE PYELOGRAM, DIAGNOSTIC URETEROSCOPY AND STENT PLACEMENT;  Surgeon: Alexis Frock, MD;  Location: [facility];  Service: Urology;  Laterality: Bilateral;  33 MINS   CYSTOSCOPY WITH RETROGRADE PYELOGRAM, URETEROSCOPY AND STENT PLACEMENT Bilateral 02/23/2019   Procedure: CYSTOSCOPY WITH RETROGRADE PYELOGRAM, URETEROSCOPY AND STENT PLACEMENT;  Surgeon: Alexis Frock, MD;  Location: [facility];  Service: Urology;  Laterality: Bilateral;  1 HR   HERNIA REPAIR     HOLMIUM LASER APPLICATION Bilateral [id number]   Procedure: HOLMIUM LASER APPLICATION;  Surgeon: Alexis Frock, MD;  Location: [facility];  Service: Urology;  Laterality: Bilateral;   Social History:  reports that he has quit smoking. He has quit using smokeless tobacco. He reports that he does not drink alcohol and does not use drugs.  Allergies  Allergen Reactions   Doxycycline Other (See Comments)    Light headed , passed out    Family History  Problem Relation Age of Onset   Coronary artery disease Father    Anxiety disorder Mother    Anxiety disorder Brother    Diabetes Brother     Prior to  Admission medications   Medication Sig Start Date End Date Taking? Authorizing Provider  acetaminophen (TYLENOL) 500 MG tablet Take 1,000 mg by mouth every 6 (six) hours as needed (pain.). Patient not taking: Reported on 03/10/2022    [provider]  albuterol (VENTOLIN HFA) 108 (90 Base) MCG/ACT inhaler Inhale 2 puffs into the lungs every 6 (six) hours as needed for wheezing or shortness of breath. 12/24/21   Biagio Borg, MD  amLODipine (NORVASC) 2.5 MG tablet Take by mouth. 08/22/20   [provider]  APPLE CIDER VINEGAR PO Take 1 capsule by mouth daily.    [provider]  cetirizine (ZYRTEC) 10 MG tablet Take 10 mg by mouth daily as needed for allergies.    [provider]  Cholecalciferol (VITAMIN D) 50 MCG (2000 UT) tablet Take 2,000 Units by mouth daily.    [provider]  clonazePAM (KLONOPIN) 0.5 MG tablet Take 1 tablet (0.5 mg total) by mouth 2 (two) times daily as needed for anxiety. 03/10/22   Biagio Borg, MD  clotrimazole-betamethasone (LOTRISONE) cream Apply 1 Application topically daily. Patient not taking: Reported on 03/10/2022 07/02/21   Biagio Borg, MD  diclofenac Sodium (VOLTAREN) 1 % GEL Apply 4 g topically 4 (four) times daily. Patient not taking: Reported on 01/22/2022 12/23/20   Deno Etienne, DO  diphenhydrAMINE (BENADRYL) 25 MG tablet Take 50 mg by mouth daily as needed for allergies. Patient not taking: Reported on 03/10/2022    [provider]  gabapentin (NEURONTIN) 300 MG capsule Take 1 capsule (300 mg total) by mouth 3 (three) times daily. Patient not taking: Reported on 03/10/2022 01/14/21   Biagio Borg, MD  HYDROcodone-acetaminophen [facility]) 7.5-325 MG tablet Take 1 tablet by mouth every 6 (six) hours as needed for moderate pain. Patient not taking: Reported on 01/22/2022 01/01/21   Biagio Borg, MD  HYDROMET 5-1.5 MG/5ML syrup Take 5 mLs by mouth every 6 (six) hours as needed. Patient not taking: Reported on  01/22/2022 01/06/22   [provider]  ibuprofen (ADVIL) 200 MG tablet Take 600 mg by mouth every 8 (eight) hours as needed (for pain.). Patient not taking: Reported on 01/22/2022    [provider]  levofloxacin (LEVAQUIN) 500 MG tablet Take 1 tablet (500 mg total) by mouth daily for 10 days. 03/10/22 03/20/22  Biagio Borg, MD  Melatonin 10 MG TBDP Take 10 mg by mouth every other day. At night (sleep aid) Patient not taking: Reported on 01/22/2022    [provider]  Multiple Vitamin (MULTIVITAMIN WITH MINERALS) TABS tablet Take 1 tablet by mouth at bedtime. Patient not taking: Reported on 03/10/2022    [provider]  predniSONE (DELTASONE) 10 MG tablet 3 tabs by mouth per day for 3 days,2tabs per day for 3 days,1tab per day for 3 days Patient not taking: Reported on 01/22/2022 12/24/21   Biagio Borg, MD  sulfamethoxazole-trimethoprim (BACTRIM DS) 800-160 MG tablet Take 1 tablet by mouth 2 (two) times daily. Patient not taking: Reported on 01/22/2022 06/05/21   Harland Dingwall L, NP-C  triamcinolone cream (KENALOG) 0.1 % Apply 1 application. topically 2 (two) times  daily. Patient not taking: Reported on 01/22/2022 06/05/21   Harland Dingwall L, NP-C  zolpidem (AMBIEN) 10 MG tablet Take 1 tablet (10 mg total) by mouth at bedtime as needed for sleep. Patient not taking: Reported on 01/22/2022 01/01/21   Biagio Borg, MD  triamcinolone (NASACORT) 55 MCG/ACT AERO nasal inhaler Place 2 sprays into the nose daily. Patient not taking: Reported on 02/01/2019 05/13/17 02/01/19  Biagio Borg, MD    Physical Exam: Vitals:   03/11/22 0540 03/11/22 0555 03/11/22 0600 03/11/22 0810  BP:  (!) 161/106 (!) 144/93   Pulse:   85   Resp:  18 (!) 28   Temp: 98.7 F (37.1 C)   98.4 F (36.9 C)  TempSrc: Oral   Oral  SpO2:  99% 96%   Weight:   (!) 143.3 kg   Height: '6\' 4"'$  (1.93 m)  '6\' 4"'$  (1.93 m)    Physical Exam Vitals and nursing note reviewed.  Constitutional:      General: He  is awake. He is not in acute distress.    Appearance: He is well-developed. He is obese.  HENT:     Head: Normocephalic.     Nose: No rhinorrhea.     Mouth/Throat:     Mouth: Mucous membranes are moist.  Eyes:     General: No scleral icterus.    Pupils: Pupils are equal, round, and reactive to light.  Neck:     Vascular: No JVD.  Cardiovascular:     Rate and Rhythm: Normal rate and regular rhythm.  Pulmonary:     Effort: Pulmonary effort is normal.     Breath sounds: No wheezing, rhonchi or rales.  Abdominal:     General: Bowel sounds are normal. There is no distension.     Palpations: Abdomen is soft.     Tenderness: There is no abdominal tenderness. There is no right CVA tenderness, left CVA tenderness or guarding.  Musculoskeletal:     Cervical back: Neck supple.     Right lower leg: No edema.     Left lower leg: No edema.  Skin:    General: Skin is warm and dry.  Neurological:     General: No focal deficit present.     Mental Status: He is alert and oriented to person, place, and time.  Psychiatric:        Mood and Affect: Mood normal.        Behavior: Behavior normal. Behavior is cooperative.   Data Reviewed:  Results are pending, will review when available.  Assessment and Plan: Principal Problem:   Acute pulmonary embolism ([place]) Inpatient/stepdown. Supplemental oxygen as needed. Continue heparin infusion. Incentive spirometry as tolerated. Continue analgesics as needed. Obtain echocardiogram. Check lower extremity Doppler to rule out DVT.  Active Problems:   Hyperglycemia Check fasting glucose in AM.    Right renal mass Scheduled for surgery on 03/24/2022. Sent message to Dr. Tresa Moore. Would need enoxaparin or heparin perioperatively.    MRSA carrier Continue preventive measures. Continue nasal mupirocin.    Hypertension Restart amlodipine 5 mg p.o. daily. Monitor blood pressure and heart rate.    GERD Will need GI prophylaxis while on  anticoagulation. Begin famotidine 20 mg p.o. twice daily.    Hyperlipidemia Currently not on medical therapy. Follow-up with primary care provider.     Advance Care Planning:   Code Status: Full Code   Consults:   Family Communication:   Severity of Illness: The appropriate patient status for this  patient is INPATIENT. Inpatient status is judged to be reasonable and necessary in order to provide the required intensity of service to ensure the patient's safety. The patient's presenting symptoms, physical exam findings, and initial radiographic and laboratory data in the context of their chronic comorbidities is felt to place them at high risk for further clinical deterioration. Furthermore, it is not anticipated that the patient will be medically stable for discharge from the hospital within 2 midnights of admission.   * I certify that at the point of admission it is my clinical judgment that the patient will require inpatient hospital care spanning beyond 2 midnights from the point of admission due to high intensity of service, high risk for further deterioration and high frequency of surveillance required.*  Author: Reubin Milan, MD 03/11/2022 8:11 AM  For on call review [email or web address].   This document was prepared using Dragon voice recognition software and may contain some unintended transcription errors.

## 2022-03-11 NOTE — ED Notes (Addendum)
**   of note, pt does report that he was recently diagnosed with right kidney cancer and is scheduled for nephrectomy on 3/20

## 2022-03-11 NOTE — Progress Notes (Signed)
ANTICOAGULATION CONSULT NOTE - Initial Consult  Pharmacy Consult for heparin Indication: pulmonary embolus  Allergies  Allergen Reactions   Doxycycline Other (See Comments)    Light headed , passed out    Patient Measurements: Height: '6\' 4"'$  (193 cm) Weight: (!) 142.4 kg (314 lb) IBW/kg (Calculated) : 86.8 Heparin Dosing Weight: 118kg  Vital Signs: Temp: 98.1 F (36.7 C) (03/07 0127) BP: 179/106 (03/07 0127) Pulse Rate: 85 (03/07 0127)  Labs: Recent Labs    03/10/22 1120 03/11/22 0146  HGB 13.5 12.7*  HCT 40.1 39.0  PLT 310.0 277  CREATININE 1.25 1.21    Estimated Creatinine Clearance: 122.6 mL/min (by C-G formula based on SCr of 1.21 mg/dL).   Medical History: Past Medical History:  Diagnosis Date   Acute prostatitis 06/19/2007   ALLERGIC RHINITIS 06/19/2007   ANXIETY 06/19/2007   ELEVATED BLOOD PRESSURE WITHOUT DIAGNOSIS OF HYPERTENSION 06/19/2007   GERD 06/19/2007   GERD (gastroesophageal reflux disease)    History of COVID-19 01/24/2019   History of kidney stones    Inguinal hernia    Migraines    NEPHROLITHIASIS, HX OF 06/19/2007     Assessment: 42 year old male presents with complaint of right side pleuritic pain and shortness of breath. No hx of DVT PE but now has known probable right renal malignancy. Pharmacy to dose heparin for PE, no prior AC noted  Hgb 12.7, plts 277, DDimer 5.38, Scr 1.21  Goal of Therapy:  Heparin level 0.3-0.7 units/ml Monitor platelets by anticoagulation protocol: Yes   Plan:  Heparin bolus 5000 units x 1  Start heparin drip at 2000 units/hr Heparin level in 6 hours Daily CBC   Dolly Rias RPh 03/11/2022, 3:00 AM

## 2022-03-11 NOTE — Progress Notes (Signed)
ANTICOAGULATION CONSULT NOTE - Follow Up Consult  Pharmacy Consult for Heparin Indication: pulmonary embolus  Allergies  Allergen Reactions   Doxycycline Other (See Comments)    Light headed , passed out    Patient Measurements: Height: '6\' 4"'$  (193 cm) Weight: (!) 143.3 kg (315 lb 14.7 oz) IBW/kg (Calculated) : 86.8 Heparin Dosing Weight: 119 kg  Vital Signs: Temp: 98.7 F (37.1 C) (03/07 0540) Temp Source: Oral (03/07 0540) BP: 144/93 (03/07 0600) Pulse Rate: 85 (03/07 0600)  Labs: Recent Labs    03/10/22 1120 03/11/22 0146  HGB 13.5 12.7*  HCT 40.1 39.0  PLT 310.0 277  APTT  --  30  LABPROT  --  13.5  INR  --  1.0  CREATININE 1.25 1.21  TROPONINIHS  --  <2    Estimated Creatinine Clearance: 123.1 mL/min (by C-G formula based on SCr of 1.21 mg/dL).   Medications:  Infusions:   heparin 2,000 Units/hr (03/11/22 [id number])    Assessment: 13 yoM presented on 3/7 with complaint of right side pleuritic pain and shortness of breath. No hx of DVT PE but now has known probable right renal malignancy. Pharmacy to dose heparin for PE, no prior AC noted.  Pharmacy is consulted to dose Heparin for acute PE.   Today, 03/11/2022: Heparin level 0.44, therapeutic on heparin 2000 units/hr CBC: Hgb down to 12.7, Plt 277 No bleeding or complications reported.    Goal of Therapy:  Heparin level 0.3-0.7 units/ml Monitor platelets by anticoagulation protocol: Yes   Plan:  Continue heparin IV infusion at 2000 units/hr Confirmatory heparin level in 6 hours Daily heparin level and CBC   Gretta Arab PharmD, BCPS [facility] [phone] 03/11/2022 8:10 AM

## 2022-03-11 NOTE — ED Triage Notes (Signed)
Pt reports increased SOB x 3 days with right side pain with inspiration. Pt saw PMD and CXR showed "possible infection" and prescribed Levaquin, but was also told that his D-Dimer was elevated and CT ordered for tomorrow but pt states that he couldn't sleep because pain was so bad.

## 2022-03-11 NOTE — Progress Notes (Signed)
Carryover admission to the Day Admitter.  I discussed this case with the EDP, Suella Broad, PA.  Per these discussions:   This is a 42 year old male with a right kidney mass for which she is scheduled to undergo right nephrectomy in a few weeks from now, who is being admitted with acute pulmonary embolism associated with right heart strain presenting with 2 to 3 days of progressive right-sided pleuritic chest discomfort associated with shortness of breath.  He had presented to his PCP with these complaints, at which time D-dimer was found to be elevated.  He was then sent for CTA chest in the emergency department which demonstrated evidence of acute pulmonary embolism with CT evidence of right heart strain.  No evidence of hypotension.  Oxygen saturations in the high 90s on room air.  EDP discussed patient's case with on-call PCCM physician, who recommended TRH admission to SDU.   He was started on heparin drip.  I have placed an order for inpatient admit to stepdown unit for further evaluation management of the above.  I have placed some additional preliminary admit orders via the adult multi-morbid admission order set. I have also ordered echocardiogram for the morning as well as serum magnesium and phosphorus levels.    Babs Bertin, DO Hospitalist

## 2022-03-11 NOTE — ED Provider Notes (Signed)
[facility] Provider Note   CSN: [id number] Arrival date & time: 03/11/22  0119     History  Chief Complaint  Patient presents with   Shortness of Breath    Marcus Burns is a 42 y.o. male.  42 year old male presents with complaint of right side pleuritic pain and shortness of breath.  Symptoms started 3 days ago, progressively worsening, worse if he tries to lay down in bed, present if he takes a deep breath, located right axillary area.  History of right kidney mass, scheduled for nephrectomy on 320.  Went to PCP today, started on Levaquin for possible infection on chest x-ray, labs sent found to have elevated D-dimer and a CT was ordered but has not been scheduled.  Patient was unable to sleep due to his symptoms and came to the ER.  He denies lower extremity swelling, prior PE or DVT.  Fevers, chills.  No other complaints or concerns.       Home Medications Prior to Admission medications   Medication Sig Start Date End Date Taking? Authorizing Provider  acetaminophen (TYLENOL) 500 MG tablet Take 1,000 mg by mouth every 6 (six) hours as needed (pain.). Patient not taking: Reported on 03/10/2022    [provider]  albuterol (VENTOLIN HFA) 108 (90 Base) MCG/ACT inhaler Inhale 2 puffs into the lungs every 6 (six) hours as needed for wheezing or shortness of breath. 12/24/21   Biagio Borg, MD  amLODipine (NORVASC) 2.5 MG tablet Take by mouth. 08/22/20   [provider]  APPLE CIDER VINEGAR PO Take 1 capsule by mouth daily.    [provider]  cetirizine (ZYRTEC) 10 MG tablet Take 10 mg by mouth daily as needed for allergies.    [provider]  Cholecalciferol (VITAMIN D) 50 MCG (2000 UT) tablet Take 2,000 Units by mouth daily.    [provider]  clonazePAM (KLONOPIN) 0.5 MG tablet Take 1 tablet (0.5 mg total) by mouth 2 (two) times daily as needed for anxiety. 03/10/22   Biagio Borg, MD   clotrimazole-betamethasone (LOTRISONE) cream Apply 1 Application topically daily. Patient not taking: Reported on 03/10/2022 07/02/21   Biagio Borg, MD  diclofenac Sodium (VOLTAREN) 1 % GEL Apply 4 g topically 4 (four) times daily. Patient not taking: Reported on 01/22/2022 12/23/20   Deno Etienne, DO  diphenhydrAMINE (BENADRYL) 25 MG tablet Take 50 mg by mouth daily as needed for allergies. Patient not taking: Reported on 03/10/2022    [provider]  gabapentin (NEURONTIN) 300 MG capsule Take 1 capsule (300 mg total) by mouth 3 (three) times daily. Patient not taking: Reported on 03/10/2022 01/14/21   Biagio Borg, MD  HYDROcodone-acetaminophen [facility]) 7.5-325 MG tablet Take 1 tablet by mouth every 6 (six) hours as needed for moderate pain. Patient not taking: Reported on 01/22/2022 01/01/21   Biagio Borg, MD  HYDROMET 5-1.5 MG/5ML syrup Take 5 mLs by mouth every 6 (six) hours as needed. Patient not taking: Reported on 01/22/2022 01/06/22   [provider]  ibuprofen (ADVIL) 200 MG tablet Take 600 mg by mouth every 8 (eight) hours as needed (for pain.). Patient not taking: Reported on 01/22/2022    [provider]  levofloxacin (LEVAQUIN) 500 MG tablet Take 1 tablet (500 mg total) by mouth daily for 10 days. 03/10/22 03/20/22  Biagio Borg, MD  Melatonin 10 MG TBDP Take 10 mg by mouth every other day. At night (  sleep aid) Patient not taking: Reported on 01/22/2022    [provider]  Multiple Vitamin (MULTIVITAMIN WITH MINERALS) TABS tablet Take 1 tablet by mouth at bedtime. Patient not taking: Reported on 03/10/2022    [provider]  predniSONE (DELTASONE) 10 MG tablet 3 tabs by mouth per day for 3 days,2tabs per day for 3 days,1tab per day for 3 days Patient not taking: Reported on 01/22/2022 12/24/21   Biagio Borg, MD  sulfamethoxazole-trimethoprim (BACTRIM DS) 800-160 MG tablet Take 1 tablet by mouth 2 (two) times daily. Patient not taking: Reported on  01/22/2022 06/05/21   Harland Dingwall L, NP-C  triamcinolone cream (KENALOG) 0.1 % Apply 1 application. topically 2 (two) times daily. Patient not taking: Reported on 01/22/2022 06/05/21   Harland Dingwall L, NP-C  zolpidem (AMBIEN) 10 MG tablet Take 1 tablet (10 mg total) by mouth at bedtime as needed for sleep. Patient not taking: Reported on 01/22/2022 01/01/21   Biagio Borg, MD  triamcinolone (NASACORT) 55 MCG/ACT AERO nasal inhaler Place 2 sprays into the nose daily. Patient not taking: Reported on 02/01/2019 05/13/17 02/01/19  Biagio Borg, MD      Allergies    Doxycycline    Review of Systems   Review of Systems Negative except as per HPI Physical Exam Updated Vital Signs BP (!) 160/103   Pulse 77   Temp 98.1 F (36.7 C)   Resp 19   Ht '6\' 4"'$  (1.93 m)   Wt (!) 142.4 kg   SpO2 98%   BMI 38.22 kg/m  Physical Exam Vitals and nursing note reviewed.  Constitutional:      General: He is not in acute distress.    Appearance: He is well-developed. He is obese. He is not diaphoretic.  HENT:     Head: Normocephalic and atraumatic.  Cardiovascular:     Rate and Rhythm: Normal rate and regular rhythm.  Pulmonary:     Effort: Pulmonary effort is normal.     Breath sounds: Normal breath sounds.  Chest:     Chest wall: No tenderness.  Musculoskeletal:     Cervical back: Neck supple.     Right lower leg: No tenderness. No edema.     Left lower leg: No tenderness. No edema.  Skin:    General: Skin is warm and dry.     Findings: No erythema or rash.  Neurological:     Mental Status: He is alert and oriented to person, place, and time.  Psychiatric:        Behavior: Behavior normal.     ED Results / Procedures / Treatments   Labs (all labs ordered are listed, but only abnormal results are displayed) Labs Reviewed  CBC WITH DIFFERENTIAL/PLATELET - Abnormal; Notable for the following components:      Result Value   Hemoglobin 12.7 (*)    All other components within normal limits   COMPREHENSIVE METABOLIC PANEL - Abnormal; Notable for the following components:   Glucose, Bld 113 (*)    Total Protein 8.5 (*)    All other components within normal limits  BRAIN NATRIURETIC PEPTIDE  PROTIME-INR  APTT  LACTIC ACID, PLASMA  HEPARIN LEVEL (UNFRACTIONATED)  TROPONIN I (HIGH SENSITIVITY)    EKG EKG Interpretation  Date/Time:  Thursday March 11 2022 01:31:42 EST Ventricular Rate:  87 PR Interval:  160 QRS Duration: 95 QT Interval:  354 QTC Calculation: 426 R Axis:   19 Text Interpretation: Sinus rhythm No significant change was found  Confirmed by Ezequiel Essex [phone]) on 03/11/2022 1:33:40 AM  Radiology CT Angio Chest PE W/Cm &/Or Wo Cm  Result Date: 03/11/2022 CLINICAL DATA:  Shortness of breath for several days on the right, history of recent right renal cell carcinoma diagnosis EXAM: CT ANGIOGRAPHY CHEST WITH CONTRAST TECHNIQUE: Multidetector CT imaging of the chest was performed using the standard protocol during bolus administration of intravenous contrast. Multiplanar CT image reconstructions and MIPs were obtained to evaluate the vascular anatomy. RADIATION DOSE REDUCTION: This exam was performed according to the departmental dose-optimization program which includes automated exposure control, adjustment of the mA and/or kV according to patient size and/or use of iterative reconstruction technique. CONTRAST:  163m OMNIPAQUE IOHEXOL 350 MG/ML SOLN COMPARISON:  02/26/2022 FINDINGS: Cardiovascular: Thoracic aorta shows no aneurysmal dilatation or dissection. Pulmonary artery demonstrates a normal branching pattern bilaterally. Large central right pulmonary embolus is noted with extension into the segmental and subsegmental branches primarily in the lower lobe. RV/LV ratio is greater than 1 consistent with right heart strain. Heart is enlarged in size. No coronary calcifications are noted. Mediastinum/Nodes: Thoracic inlet is within normal limits. No hilar or mediastinal  adenopathy is noted. The esophagus as visualized is within normal limits. Lungs/Pleura: Left lung is well aerated and clear. Right lung demonstrates several pleural based rounded areas of increased airspace opacity in the lower lobe likely representing early pulmonary infarcts. No sizable parenchymal nodule is noted. No effusion is seen. Upper Abdomen: There is again visualized in the upper pole mass lesion in the right kidney incompletely evaluated on this exam but stable from the prior study. No other focal abnormality in the upper abdomen is noted. Musculoskeletal: Degenerative changes of the thoracic spine are noted. No acute rib abnormality is noted. Review of the MIP images confirms the above findings. IMPRESSION: Extensive right-sided pulmonary emboli with evidence of right heart strain. Pleural based somewhat rounded areas of increased airspace opacity in the right lower lobe likely representing early changes of pulmonary infarct. Changes consistent with the known history of right renal cell carcinoma. Critical Value/emergent results were called by telephone at the time of interpretation on 03/11/2022 at 2:47 am [facility] verb[facility] these results. Electronically Signed   By: MInez CatalinaM.D.   On: 03/11/2022 02:49   DG Chest 2 View  Result Date: 03/10/2022 CLINICAL DATA:  Right lateral pleuritic chest pain. Cough. Former smoker. EXAM: CHEST - 2 VIEW COMPARISON:  None Available. FINDINGS: An ill-defined rounded density is identified in the periphery of the right lung base on the frontal view. No other pulmonary nodule, mass, or infiltrate. The cardiomediastinal silhouette is normal. No pneumothorax. IMPRESSION: 1. An ill-defined rounded density is projected in the periphery of the right lung base. This could represent an infectious or inflammatory process. A pulmonary nodule is not excluded on this single study. Recommend a short-term follow-up x-ray in 2 or 3 weeks to ensure resolution  of the finding. If the finding does not resolve, recommend a CT scan. Electronically Signed   By: DDorise BullionIII M.D.   On: 03/10/2022 11:34    Procedures .Critical Care  Performed by: MTacy Learn PA-C Authorized by: MTacy Learn PA-C   Critical care provider statement:    Critical care time (minutes):  30   Critical care was time spent personally by me on the following activities:  Development of treatment plan with patient or surrogate, discussions with consultants, evaluation of patient's response to treatment, examination of patient, ordering and  review of laboratory studies, ordering and review of radiographic studies, ordering and performing treatments and interventions, pulse oximetry, re-evaluation of patient's condition and review of old charts     Medications Ordered in ED Medications  heparin ADULT infusion 100 units/mL (25000 units/215m) (2,000 Units/hr Intravenous New Bag/Given 03/11/22 0319)  sodium chloride 0.9 % bolus 500 mL (0 mLs Intravenous Stopped 03/11/22 0229)  sodium chloride (PF) 0.9 % injection (  Given 03/11/22 0330)  iohexol (OMNIPAQUE) 350 MG/ML injection 100 mL (100 mLs Intravenous Contrast Given 03/11/22 0231)  heparin bolus via infusion 5,000 Units (5,000 Units Intravenous Bolus from Bag 03/11/22 0317)    ED Course/ Medical Decision Making/ A&P                             Medical Decision Making Amount and/or Complexity of Data Reviewed Labs: ordered. Radiology: ordered.   This patient presents to the ED for concern of shortness of breath, pleuritic pain, this involves an extensive number of treatment options, and is a complaint that carries with it a high risk of complications and morbidity.  The differential diagnosis includes but not limited to PE, Pneumonia, muscle strain,   Co morbidities that complicate the patient evaluation  GERD, hypertension, kidney stones, renal mass with scheduled nephrectomy with concern for cancer   Additional  history obtained:  Additional history obtained from family at bedside who contributes to history as above External records from outside source obtained and reviewed including D-dimer positive at 5 outpatient today, chest x-ray with concern for right-sided pneumonia   Lab Tests:  I Ordered, and personally interpreted labs.  The pertinent results include: CBC without significant findings.  CMP without significant findings.  Troponin negative, INR within normal notes.  BNP within normal notes.  Lactic acid negative.   Imaging Studies ordered:  I ordered imaging studies including CTA chest I independently visualized and interpreted imaging which showed right-sided PE with heart strain I agree with the radiologist interpretation   Cardiac Monitoring: / EKG:  The patient was maintained on a cardiac monitor.  I personally viewed and interpreted the cardiac monitored which showed an underlying rhythm of: Sinus rhythm, rate 87   Consultations Obtained:  I requested consultation with the intensivist,  and discussed lab and imaging findings as well as pertinent plan - they recommend: Hospitalist to admit Case discussed with Dr. HVelia Meyerwith [facility] who will consult for admission.    Problem List / ED Course / Critical interventions / Medication management  42year old male presents with complaint of shortness of breath and pleuritic pain on right side of chest concerning for PE.  Lungs clear although does have pleuritic pain which stops patient from taking full deep breath.  CT is positive for right-sided PE with heart strain.  Heparin ordered per pharmacy.  Discussed with intensivist who recommends hospitalist admission.  Satting 100% on room air.  Discussed with hospitalist who will consult for admission. I ordered medication including heparin for PE Reevaluation of the patient after these medicines showed that the patient stayed the same I have reviewed the patients home  medicines and have made adjustments as needed   Social Determinants of Health:  Lives with family, has specialty care team   Test / Admission - Considered:  Admit         Final Clinical Impression(s) / ED Diagnoses Final diagnoses:  Acute pulmonary embolism with acute cor pulmonale, unspecified pulmonary embolism type ([place]  Rx / DC Orders ED Discharge Orders     None         Roque Lias 03/11/22 Suezanne Jacquet, MD 03/11/22 [phone]

## 2022-03-11 NOTE — Progress Notes (Signed)
ANTICOAGULATION CONSULT NOTE - Follow Up Consult  Pharmacy Consult for Heparin Indication: pulmonary embolus  Allergies  Allergen Reactions   Doxycycline Other (See Comments)    Light headed , passed out    Patient Measurements: Height: '6\' 4"'$  (193 cm) Weight: (!) 143.3 kg (315 lb 14.7 oz) IBW/kg (Calculated) : 86.8 Heparin Dosing Weight: 119 kg  Vital Signs: Temp: 98.4 F (36.9 C) (03/07 0810) Temp Source: Oral (03/07 0810) BP: 136/84 (03/07 1342) Pulse Rate: 89 (03/07 0900)  Labs: Recent Labs    03/10/22 1120 03/11/22 0146 03/11/22 0916 03/11/22 1613  HGB 13.5 12.7*  --   --   HCT 40.1 39.0  --   --   PLT 310.0 277  --   --   APTT  --  30  --   --   LABPROT  --  13.5  --   --   INR  --  1.0  --   --   HEPARINUNFRC  --   --  0.44 0.32  CREATININE 1.25 1.21  --   --   TROPONINIHS  --  <2  --   --      Estimated Creatinine Clearance: 123.1 mL/min (by C-G formula based on SCr of 1.21 mg/dL).   Medications:  Infusions:   heparin 2,000 Units/hr (03/11/22 1351)    Assessment: 49 yoM presented on 3/7 with complaint of right side pleuritic pain and shortness of breath. No hx of DVT PE but now has known probable right renal malignancy. Pharmacy to dose heparin for PE, no prior AC noted.  Pharmacy is consulted to dose Heparin for acute PE.   Today, 03/11/2022: Heparin level 0.32, therapeutic on heparin 2000 units/hr CBC: Hgb down to 12.7, Plt 277 No bleeding or complications reported.    Goal of Therapy:  Heparin level 0.3-0.7 units/ml Monitor platelets by anticoagulation protocol: Yes   Plan:  Continue heparin IV infusion at 2000 units/hr Daily heparin level and CBC Monitor for signs and symptoms of bleeding    Royetta Asal, PharmD, BCPS 03/11/2022 5:58 PM

## 2022-03-12 ENCOUNTER — Other Ambulatory Visit (HOSPITAL_COMMUNITY): Payer: Self-pay

## 2022-03-12 ENCOUNTER — Telehealth: Payer: Self-pay | Admitting: Hematology and Oncology

## 2022-03-12 ENCOUNTER — Encounter: Payer: Self-pay | Admitting: Internal Medicine

## 2022-03-12 DIAGNOSIS — E669 Obesity, unspecified: Secondary | ICD-10-CM | POA: Insufficient documentation

## 2022-03-12 DIAGNOSIS — I10 Essential (primary) hypertension: Secondary | ICD-10-CM

## 2022-03-12 DIAGNOSIS — I2699 Other pulmonary embolism without acute cor pulmonale: Secondary | ICD-10-CM

## 2022-03-12 DIAGNOSIS — N2889 Other specified disorders of kidney and ureter: Secondary | ICD-10-CM

## 2022-03-12 LAB — CBC
HCT: 38.6 % — ABNORMAL LOW (ref 39.0–52.0)
Hemoglobin: 12.2 g/dL — ABNORMAL LOW (ref 13.0–17.0)
MCH: 27.9 pg (ref 26.0–34.0)
MCHC: 31.6 g/dL (ref 30.0–36.0)
MCV: 88.3 fL (ref 80.0–100.0)
Platelets: 260 10*3/uL (ref 150–400)
RBC: 4.37 MIL/uL (ref 4.22–5.81)
RDW: 13.7 % (ref 11.5–15.5)
WBC: 9.6 10*3/uL (ref 4.0–10.5)
nRBC: 0 % (ref 0.0–0.2)

## 2022-03-12 LAB — COMPREHENSIVE METABOLIC PANEL
ALT: 12 U/L (ref 0–44)
AST: 14 U/L — ABNORMAL LOW (ref 15–41)
Albumin: 3.7 g/dL (ref 3.5–5.0)
Alkaline Phosphatase: 70 U/L (ref 38–126)
Anion gap: 11 (ref 5–15)
BUN: 13 mg/dL (ref 6–20)
CO2: 26 mmol/L (ref 22–32)
Calcium: 9.3 mg/dL (ref 8.9–10.3)
Chloride: 101 mmol/L (ref 98–111)
Creatinine, Ser: 1.28 mg/dL — ABNORMAL HIGH (ref 0.61–1.24)
GFR, Estimated: >60 mL/min (ref >60)
Glucose, Bld: 110 mg/dL — ABNORMAL HIGH (ref 70–99)
Potassium: 4 mmol/L (ref 3.5–5.1)
Sodium: 138 mmol/L (ref 135–145)
Total Bilirubin: 1.1 mg/dL (ref 0.3–1.2)
Total Protein: 8.1 g/dL (ref 6.5–8.1)

## 2022-03-12 LAB — HEPARIN LEVEL (UNFRACTIONATED): Heparin Unfractionated: 0.19 IU/mL — ABNORMAL LOW (ref 0.30–0.70)

## 2022-03-12 MED ORDER — APIXABAN 5 MG PO TABS
10.0000 mg | ORAL_TABLET | Freq: Two times a day (BID) | ORAL | Status: DC
  Administered 2022-03-12 – 2022-03-14 (×5): 10 mg via ORAL
  Filled 2022-03-12 (×5): qty 2

## 2022-03-12 MED ORDER — HEPARIN BOLUS VIA INFUSION
3500.0000 [IU] | Freq: Once | INTRAVENOUS | Status: AC
  Administered 2022-03-12: 3500 [IU] via INTRAVENOUS
  Filled 2022-03-12: qty 3500

## 2022-03-12 MED ORDER — APIXABAN (ELIQUIS) VTE STARTER PACK (10MG AND 5MG)
ORAL_TABLET | ORAL | 0 refills | Status: DC
  Filled 2022-03-12: qty 74, 30d supply, fill #0

## 2022-03-12 MED ORDER — APIXABAN 5 MG PO TABS: 5.0000 mg | ORAL_TABLET | Freq: Two times a day (BID) | ORAL | Status: DC

## 2022-03-12 NOTE — Assessment & Plan Note (Signed)
Mild to mod worsening due to situation with upcoming surgury, now for change xanax to klnoopin 0.5 bid prn

## 2022-03-12 NOTE — Telephone Encounter (Signed)
scheduled per 3/8 referral , pt and pt wife has been called and confirmed date and time. Pt is aware of location and to arrive early for check in

## 2022-03-12 NOTE — Consult Note (Signed)
Reason for Consult: Right Renal Cancer, PE  Referring Physician: Norina Buzzard MD  Marcus Burns is an 42 y.o. male.   HPI:   1 - Large Right Renal Cancer - 10cm Rt upper enhancing solid mass with central necrosis new on Abd CT 02/2022. Appears cortical from upper pole. 1 artery / 1 vein right renovascular anatomy. This appears new since CT 2021 suggesting very rapid progression. No known sickle disease / trait.   2 - Pulmonary Embolism - very large Rt sided PE with heart strain on CT 03/11/22 on ER eval. Stared on heparin gtt. His renal cancer is only known risk factor.   PMH sig for obesity, LIH. His PCP is Cathlean Cower MD. 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 His wife Estill Bamberg is involved and also in healthcare They have 80yo and 68yo kids.   Today " Marcus Burns " is seen for opinion on above. He is tentatvely scheduled for nephrectomy on 3/20, but now has large symptomatic PE. This is unfortunate.   Past Medical History:  Diagnosis Date   Acute prostatitis 06/19/2007   ALLERGIC RHINITIS 06/19/2007   ANXIETY 06/19/2007   ELEVATED BLOOD PRESSURE WITHOUT DIAGNOSIS OF HYPERTENSION 06/19/2007   GERD 06/19/2007   GERD (gastroesophageal reflux disease)    History of COVID-19 01/24/2019   History of kidney stones    Inguinal hernia    Migraines    NEPHROLITHIASIS, HX OF 06/19/2007    Past Surgical History:  Procedure Laterality Date   CYSTOSCOPY WITH RETROGRADE PYELOGRAM, URETEROSCOPY AND STENT PLACEMENT Bilateral 02/09/2019   Procedure: CYSTOSCOPY WITH RETROGRADE PYELOGRAM, DIAGNOSTIC URETEROSCOPY AND STENT PLACEMENT;  Surgeon: Alexis Frock, MD;  Location: [facility];  Service: Urology;  Laterality: Bilateral;  11 MINS   CYSTOSCOPY WITH RETROGRADE PYELOGRAM, URETEROSCOPY AND STENT PLACEMENT Bilateral 02/23/2019   Procedure: CYSTOSCOPY WITH RETROGRADE PYELOGRAM, URETEROSCOPY AND STENT PLACEMENT;  Surgeon: Alexis Frock, MD;  Location: [facility];  Service: Urology;   Laterality: Bilateral;  1 HR   HERNIA REPAIR     HOLMIUM LASER APPLICATION Bilateral [id number]   Procedure: HOLMIUM LASER APPLICATION;  Surgeon: Alexis Frock, MD;  Location: [facility];  Service: Urology;  Laterality: Bilateral;    Family History  Problem Relation Age of Onset   Coronary artery disease Father    Anxiety disorder Mother    Anxiety disorder Brother    Diabetes Brother     Social History:  reports that he has quit smoking. He has quit using smokeless tobacco. He reports that he does not drink alcohol and does not use drugs.  Allergies:  Allergies  Allergen Reactions   Doxycycline Other (See Comments)    Light headed , passed out    Medications: I have reviewed the patient's current medications.  Results for orders placed or performed during the hospital encounter of 03/11/22 (from the past 48 hour(s))  CBC with Differential     Status: Abnormal   Collection Time: 03/11/22  1:46 AM  Result Value Ref Range   WBC 7.9 4.0 - 10.5 K/uL   RBC 4.47 4.22 - 5.81 MIL/uL   Hemoglobin 12.7 (L) 13.0 - 17.0 g/dL   HCT 39.0 39.0 - 52.0 %   MCV 87.2 80.0 - 100.0 fL   MCH 28.4 26.0 - 34.0 pg   MCHC 32.6 30.0 - 36.0 g/dL   RDW 13.7 11.5 - 15.5 %   Platelets 277 150 - 400 K/uL   nRBC 0.0 0.0 - 0.2 %  Neutrophils Relative % 63 %   Neutro Abs 5.1 1.7 - 7.7 K/uL   Lymphocytes Relative 23 %   Lymphs Abs 1.8 0.7 - 4.0 K/uL   Monocytes Relative 11 %   Monocytes Absolute 0.9 0.1 - 1.0 K/uL   Eosinophils Relative 3 %   Eosinophils Absolute 0.2 0.0 - 0.5 K/uL   Basophils Relative 0 %   Basophils Absolute 0.0 0.0 - 0.1 K/uL   Immature Granulocytes 0 %   Abs Immature Granulocytes 0.01 0.00 - 0.07 K/uL    Comment: Performed at [facility], [place] [address] [place], [place] [address]  Comprehensive metabolic panel     Status: Abnormal   Collection Time: 03/11/22  1:46 AM  Result Value Ref Range   Sodium 135 135 - 145 mmol/L   Potassium 3.5 3.5 - 5.1 mmol/L    Chloride 103 98 - 111 mmol/L   CO2 26 22 - 32 mmol/L   Glucose, Bld 113 (H) 70 - 99 mg/dL    Comment: Glucose reference range applies only to samples taken after fasting for at least 8 hours.   BUN 14 6 - 20 mg/dL   Creatinine, Ser 1.21 0.61 - 1.24 mg/dL   Calcium 9.4 8.9 - 10.3 mg/dL   Total Protein 8.5 (H) 6.5 - 8.1 g/dL   Albumin 3.9 3.5 - 5.0 g/dL   AST 18 15 - 41 U/L   ALT 14 0 - 44 U/L   Alkaline Phosphatase 76 38 - 126 U/L   Total Bilirubin 0.9 0.3 - 1.2 mg/dL   GFR, Estimated >60 >60 mL/min    Comment: (NOTE) Calculated using the CKD-EPI Creatinine Equation (2021)    Anion gap 6 5 - 15    Comment: Performed at [facility], [place] [address] [place], [place] [address]  Troponin I (High Sensitivity)     Status: None   Collection Time: 03/11/22  1:46 AM  Result Value Ref Range   Troponin I (High Sensitivity) <2 <18 ng/L    Comment: (NOTE) Elevated high sensitivity troponin I (hsTnI) values and significant  changes across serial measurements may suggest ACS but many other  chronic and acute conditions are known to elevate hsTnI results.  Refer to the "Links" section for chest pain algorithms and additional  guidance. Performed at [facility], [place] [address], [place], [place] [address]   Brain natriuretic peptide     Status: None   Collection Time: 03/11/22  1:46 AM  Result Value Ref Range   B Natriuretic Peptide 7.5 0.0 - 100.0 pg/mL    Comment: Performed at [facility], [place] [address] [place], [place] [address]  Protime-INR     Status: None   Collection Time: 03/11/22  1:46 AM  Result Value Ref Range   Prothrombin Time 13.5 11.4 - 15.2 seconds   INR 1.0 0.8 - 1.2    Comment: (NOTE) INR goal varies based on device and disease states. Performed at [facility], [place] [address] [place], [place] [address]   APTT     Status: None   Collection Time: 03/11/22  1:46 AM  Result Value Ref Range    aPTT 30 24 - 36 seconds    Comment: Performed at [facility], [place] [address] [place], [place] [address]  Magnesium     Status: None   Collection Time: 03/11/22  1:46 AM  Result Value Ref Range   Magnesium 2.2 1.7 -  2.4 mg/dL    Comment: Performed at [facility], [place] [address], [place], [place] [address]  Phosphorus     Status: None   Collection Time: 03/11/22  1:46 AM  Result Value Ref Range   Phosphorus 4.0 2.5 - 4.6 mg/dL    Comment: Performed at [facility], [place] [address], [place], [place] [address]  Lactic acid, plasma     Status: None   Collection Time: 03/11/22  3:07 AM  Result Value Ref Range   Lactic Acid, Venous 0.8 0.5 - 1.9 mmol/L    Comment: Performed at [facility], [place] [address], [place], [place] [address]  MRSA Next Gen by PCR, Nasal     Status: Abnormal   Collection Time: 03/11/22  5:39 AM   Specimen: Nasal Mucosa; Nasal Swab  Result Value Ref Range   MRSA by PCR Next Gen DETECTED (A) NOT DETECTED    Comment: RESULT CALLED TO, READ BACK BY AND VERIFIED WITH: MAY, B. RN AT 0745 ON 03/11/2022 BY MECIAL J. (NOTE) The GeneXpert MRSA Assay (FDA approved for NASAL specimens only), is one component of a comprehensive MRSA colonization surveillance program. It is not intended to diagnose MRSA infection nor to guide or monitor treatment for MRSA infections. Test performance is not FDA approved in patients less than 40 years old. Performed at [facility], [place] [address] [place], [place] [address]   Heparin level (unfractionated)     Status: None   Collection Time: 03/11/22  9:16 AM  Result Value Ref Range   Heparin Unfractionated 0.44 0.30 - 0.70 IU/mL    Comment: (NOTE) The clinical reportable range upper limit is being lowered to >1.10 to align with the FDA approved guidance for the current laboratory assay.  If heparin results are below expected values, and  patient dosage has  been confirmed, suggest follow up testing of antithrombin III levels. Performed at [facility], [place] [address], [place], [place] [address]   Heparin level (unfractionated)     Status: None   Collection Time: 03/11/22  4:13 PM  Result Value Ref Range   Heparin Unfractionated 0.32 0.30 - 0.70 IU/mL    Comment: (NOTE) The clinical reportable range upper limit is being lowered to >1.10 to align with the FDA approved guidance for the current laboratory assay.  If heparin results are below expected values, and patient dosage has  been confirmed, suggest follow up testing of antithrombin III levels. Performed at [facility], [place] [address] [place], [place] [address]   CBC     Status: Abnormal   Collection Time: 03/12/22  3:40 AM  Result Value Ref Range   WBC 9.6 4.0 - 10.5 K/uL   RBC 4.37 4.22 - 5.81 MIL/uL   Hemoglobin 12.2 (L) 13.0 - 17.0 g/dL   HCT 38.6 (L) 39.0 - 52.0 %   MCV 88.3 80.0 - 100.0 fL   MCH 27.9 26.0 - 34.0 pg   MCHC 31.6 30.0 - 36.0 g/dL   RDW 13.7 11.5 - 15.5 %   Platelets 260 150 - 400 K/uL   nRBC 0.0 0.0 - 0.2 %    Comment: Performed at [facility], [place] [address] [place], [place] [address]  Comprehensive metabolic panel     Status: Abnormal   Collection Time: 03/12/22  3:40 AM  Result Value Ref Range   Sodium 138 135 - 145 mmol/L   Potassium 4.0 3.5 - 5.1  mmol/L   Chloride 101 98 - 111 mmol/L   CO2 26 22 - 32 mmol/L   Glucose, Bld 110 (H) 70 - 99 mg/dL    Comment: Glucose reference range applies only to samples taken after fasting for at least 8 hours.   BUN 13 6 - 20 mg/dL   Creatinine, Ser 1.28 (H) 0.61 - 1.24 mg/dL   Calcium 9.3 8.9 - 10.3 mg/dL   Total Protein 8.1 6.5 - 8.1 g/dL   Albumin 3.7 3.5 - 5.0 g/dL   AST 14 (L) 15 - 41 U/L   ALT 12 0 - 44 U/L   Alkaline Phosphatase 70 38 - 126 U/L   Total Bilirubin 1.1 0.3 - 1.2 mg/dL   GFR, Estimated >60 >60 mL/min     Comment: (NOTE) Calculated using the CKD-EPI Creatinine Equation (2021)    Anion gap 11 5 - 15    Comment: Performed at [facility], [place] [address] [place], [place] [address]  Heparin level (unfractionated)     Status: Abnormal   Collection Time: 03/12/22  3:40 AM  Result Value Ref Range   Heparin Unfractionated 0.19 (L) 0.30 - 0.70 IU/mL    Comment: (NOTE) The clinical reportable range upper limit is being lowered to >1.10 to align with the FDA approved guidance for the current laboratory assay.  If heparin results are below expected values, and patient dosage has  been confirmed, suggest follow up testing of antithrombin III levels. Performed at [facility], [place] [address] [place], [place] [address]     VAS [place] LOWER EXTREMITY VENOUS (DVT)  Result Date: 03/11/2022  Lower Venous DVT Study Patient Name:  Marcus Burns  Date of Exam:   03/11/2022 Medical Rec #: [id number]          Accession #:    [id number] Date of Birth: Jan 21, 1980          Patient Gender: M Patient Age:   15 years Exam Location:  [facility] Procedure:      VAS [place] LOWER EXTREMITY VENOUS (DVT) Referring Phys: DAVID ORTIZ --------------------------------------------------------------------------------  Indications: Pulmonary embolism.  Comparison Study: No previous exams Performing Technologist: Jody Hill RVT, RDMS  Examination Guidelines: A complete evaluation includes B-mode imaging, spectral Doppler, color Doppler, and power Doppler as needed of all accessible portions of each vessel. Bilateral testing is considered an integral part of a complete examination. Limited examinations for reoccurring indications may be performed as noted. The reflux portion of the exam is performed with the patient in reverse Trendelenburg.  +---------+---------------+---------+-----------+----------+--------------+ RIGHT    CompressibilityPhasicitySpontaneityPropertiesThrombus Aging  +---------+---------------+---------+-----------+----------+--------------+ CFV      Full           Yes      Yes                                 +---------+---------------+---------+-----------+----------+--------------+ SFJ      Full                                                        +---------+---------------+---------+-----------+----------+--------------+ FV Prox  Full           Yes      Yes                                 +---------+---------------+---------+-----------+----------+--------------+  FV Mid   Full           Yes      Yes                                 +---------+---------------+---------+-----------+----------+--------------+ FV DistalFull           Yes      Yes                                 +---------+---------------+---------+-----------+----------+--------------+ PFV      Full                                                        +---------+---------------+---------+-----------+----------+--------------+ POP      Full           Yes      Yes                                 +---------+---------------+---------+-----------+----------+--------------+ PTV      Full                                                        +---------+---------------+---------+-----------+----------+--------------+ PERO     Full                                                        +---------+---------------+---------+-----------+----------+--------------+   +---------+---------------+---------+-----------+----------+--------------+ LEFT     CompressibilityPhasicitySpontaneityPropertiesThrombus Aging +---------+---------------+---------+-----------+----------+--------------+ CFV      Full           Yes      Yes                                 +---------+---------------+---------+-----------+----------+--------------+ SFJ      Full                                                         +---------+---------------+---------+-----------+----------+--------------+ FV Prox  Full           Yes      Yes                                 +---------+---------------+---------+-----------+----------+--------------+ FV Mid   Full           Yes      Yes                                 +---------+---------------+---------+-----------+----------+--------------+ FV DistalFull  No       Yes                                 +---------+---------------+---------+-----------+----------+--------------+ PFV      Full                                                        +---------+---------------+---------+-----------+----------+--------------+ POP      Full           Yes      Yes                                 +---------+---------------+---------+-----------+----------+--------------+ PTV      Full                                                        +---------+---------------+---------+-----------+----------+--------------+ PERO     Full                                                        +---------+---------------+---------+-----------+----------+--------------+     Summary: BILATERAL: - No evidence of deep vein thrombosis seen in the lower extremities, bilaterally. -No evidence of popliteal cyst, bilaterally.   *See table(s) above for measurements and observations. Electronically signed by Orlie Pollen on 03/11/2022 at 4:18:57 PM.    Final    ECHOCARDIOGRAM COMPLETE  Result Date: 03/11/2022    ECHOCARDIOGRAM REPORT   Patient Name:   Marcus Burns Date of Exam: 03/11/2022 Medical Rec #:  [id number]         Height:       76.0 in Accession #:    [id number]        Weight:       315.9 lb Date of Birth:  04-06-1980         BSA:          2.690 m Patient Age:    33 years          BP:           149/101 mmHg Patient Gender: M                 HR:           90 bpm. Exam Location:  Inpatient Procedure: 2D Echo and Intracardiac Opacification Agent Indications:     pulmonary embolus  History:        Patient has no prior history of Echocardiogram examinations.  Sonographer:    Harvie Junior Referring Phys: [id number] Rhetta Mura  Sonographer Comments: Technically difficult study due to poor echo windows, patient is obese and no subcostal window. Image acquisition challenging due to patient body habitus, Image acquisition challenging due to respiratory motion and lung artifact. IMPRESSIONS  1. Left ventricular ejection fraction, by estimation, is 60 to 65%. The left ventricle has normal function. The left ventricle has no regional  wall motion abnormalities. Left ventricular diastolic parameters were normal.  2. Right ventricular systolic function is normal. The right ventricular size is normal. There is normal pulmonary artery systolic pressure.  3. The mitral valve is normal in structure. No evidence of mitral valve regurgitation. No evidence of mitral stenosis.  4. The aortic valve is normal in structure. Aortic valve regurgitation is not visualized. No aortic stenosis is present.  5. The inferior vena cava is normal in size with greater than 50% respiratory variability, suggesting right atrial pressure of 3 mmHg. FINDINGS  Left Ventricle: Left ventricular ejection fraction, by estimation, is 60 to 65%. The left ventricle has normal function. The left ventricle has no regional wall motion abnormalities. Definity contrast agent was given IV to delineate the left ventricular  endocardial borders. The left ventricular internal cavity size was normal in size. There is no left ventricular hypertrophy. Left ventricular diastolic parameters were normal. Right Ventricle: The right ventricular size is normal. No increase in right ventricular wall thickness. Right ventricular systolic function is normal. There is normal pulmonary artery systolic pressure. The tricuspid regurgitant velocity is 1.20 m/s, and  with an assumed right atrial pressure of 3 mmHg, the estimated right  ventricular systolic pressure is 8.8 mmHg. Left Atrium: Left atrial size was normal in size. Right Atrium: Right atrial size was normal in size. Pericardium: There is no evidence of pericardial effusion. Mitral Valve: The mitral valve is normal in structure. No evidence of mitral valve regurgitation. No evidence of mitral valve stenosis. Tricuspid Valve: The tricuspid valve is normal in structure. Tricuspid valve regurgitation is not demonstrated. No evidence of tricuspid stenosis. Aortic Valve: The aortic valve is normal in structure. Aortic valve regurgitation is not visualized. No aortic stenosis is present. Aortic valve mean gradient measures 4.0 mmHg. Aortic valve peak gradient measures 7.3 mmHg. Aortic valve area, by VTI measures 3.38 cm. Pulmonic Valve: The pulmonic valve was normal in structure. Pulmonic valve regurgitation is not visualized. No evidence of pulmonic stenosis. Aorta: The aortic root is normal in size and structure. Venous: The inferior vena cava is normal in size with greater than 50% respiratory variability, suggesting right atrial pressure of 3 mmHg. IAS/Shunts: No atrial level shunt detected by color flow Doppler.  LEFT VENTRICLE PLAX 2D LVIDd:         4.50 cm   Diastology LVIDs:         2.80 cm   LV e' medial:    10.00 cm/s LV PW:         1.00 cm   LV E/e' medial:  6.1 LV IVS:        1.00 cm   LV e' lateral:   16.10 cm/s LVOT diam:     2.20 cm   LV E/e' lateral: 3.8 LV SV:         77 LV SV Index:   29 LVOT Area:     3.80 cm  RIGHT VENTRICLE RV S prime:     15.30 cm/s LEFT ATRIUM           Index        RIGHT ATRIUM           Index LA diam:      3.60 cm 1.34 cm/m   RA Area:     13.90 cm LA Vol (A4C): 68.1 ml 25.31 ml/m  RA Volume:   34.80 ml  12.94 ml/m  AORTIC VALVE  PULMONIC VALVE AV Area (Vmax):    3.55 cm     PV Vmax:       1.47 m/s AV Area (Vmean):   3.39 cm     PV Peak grad:  8.6 mmHg AV Area (VTI):     3.38 cm AV Vmax:           135.00 cm/s AV Vmean:           89.900 cm/s AV VTI:            0.227 m AV Peak Grad:      7.3 mmHg AV Mean Grad:      4.0 mmHg LVOT Vmax:         126.00 cm/s LVOT Vmean:        80.100 cm/s LVOT VTI:          0.202 m LVOT/AV VTI ratio: 0.89  AORTA Ao Root diam: 3.60 cm Ao Asc diam:  3.60 cm MITRAL VALVE               TRICUSPID VALVE MV Area (PHT): 4.06 cm    TR Peak grad:   5.8 mmHg MV Decel Time: 187 msec    TR Vmax:        120.00 cm/s MV E velocity: 60.70 cm/s MV A velocity: 69.60 cm/s  SHUNTS MV E/A ratio:  0.87        Systemic VTI:  0.20 m                            Systemic Diam: 2.20 cm Dani Gobble Croitoru MD Electronically signed by Sanda Klein MD Signature Date/Time: 03/11/2022/1:28:22 PM    Final    CT Angio Chest PE W/Cm &/Or Wo Cm  Result Date: 03/11/2022 CLINICAL DATA:  Shortness of breath for several days on the right, history of recent right renal cell carcinoma diagnosis EXAM: CT ANGIOGRAPHY CHEST WITH CONTRAST TECHNIQUE: Multidetector CT imaging of the chest was performed using the standard protocol during bolus administration of intravenous contrast. Multiplanar CT image reconstructions and MIPs were obtained to evaluate the vascular anatomy. RADIATION DOSE REDUCTION: This exam was performed according to the departmental dose-optimization program which includes automated exposure control, adjustment of the mA and/or kV according to patient size and/or use of iterative reconstruction technique. CONTRAST:  171m OMNIPAQUE IOHEXOL 350 MG/ML SOLN COMPARISON:  02/26/2022 FINDINGS: Cardiovascular: Thoracic aorta shows no aneurysmal dilatation or dissection. Pulmonary artery demonstrates a normal branching pattern bilaterally. Large central right pulmonary embolus is noted with extension into the segmental and subsegmental branches primarily in the lower lobe. RV/LV ratio is greater than 1 consistent with right heart strain. Heart is enlarged in size. No coronary calcifications are noted. Mediastinum/Nodes: Thoracic inlet is within  normal limits. No hilar or mediastinal adenopathy is noted. The esophagus as visualized is within normal limits. Lungs/Pleura: Left lung is well aerated and clear. Right lung demonstrates several pleural based rounded areas of increased airspace opacity in the lower lobe likely representing early pulmonary infarcts. No sizable parenchymal nodule is noted. No effusion is seen. Upper Abdomen: There is again visualized in the upper pole mass lesion in the right kidney incompletely evaluated on this exam but stable from the prior study. No other focal abnormality in the upper abdomen is noted. Musculoskeletal: Degenerative changes of the thoracic spine are noted. No acute rib abnormality is noted. Review of the MIP images confirms the above findings. IMPRESSION: Extensive right-sided pulmonary emboli  with evidence of right heart strain. Pleural based somewhat rounded areas of increased airspace opacity in the right lower lobe likely representing early changes of pulmonary infarct. Changes consistent with the known history of right renal cell carcinoma. Critical Value/emergent results were called by telephone at the time of interpretation on 03/11/2022 at 2:47 am to DR [facility], who verbally acknowledged these results. Electronically Signed   By: Inez Catalina M.D.   On: 03/11/2022 02:49   DG Chest 2 View  Result Date: 03/10/2022 CLINICAL DATA:  Right lateral pleuritic chest pain. Cough. Former smoker. EXAM: CHEST - 2 VIEW COMPARISON:  None Available. FINDINGS: An ill-defined rounded density is identified in the periphery of the right lung base on the frontal view. No other pulmonary nodule, mass, or infiltrate. The cardiomediastinal silhouette is normal. No pneumothorax. IMPRESSION: 1. An ill-defined rounded density is projected in the periphery of the right lung base. This could represent an infectious or inflammatory process. A pulmonary nodule is not excluded on this single study. Recommend a short-term follow-up  x-ray in 2 or 3 weeks to ensure resolution of the finding. If the finding does not resolve, recommend a CT scan. Electronically Signed   By: Dorise Bullion III M.D.   On: 03/10/2022 11:34    Review of Systems  Constitutional:  Negative for chills and fever.  Respiratory:  Positive for chest tightness.   All other systems reviewed and are negative.  Blood pressure (!) 155/90, pulse 84, temperature 98.8 F (37.1 C), temperature source Oral, resp. rate 19, height '6\' 4"'$  (1.93 m), weight (!) 143.3 kg, SpO2 99 %. Physical Exam Vitals reviewed.  Constitutional:      Comments: Extremely pleasant, at baseline. Large stature.   HENT:     Head: Normocephalic.  Pulmonary:     Effort: Pulmonary effort is normal.     Comments: Non-labored breathign on [place] O2 at present. NO splinting.  Abdominal:     Palpations: Abdomen is soft.  Genitourinary:    Comments: No CVAT at present.  Musculoskeletal:        General: Normal range of motion.     Cervical back: Normal range of motion.  Skin:    General: Skin is warm.  Neurological:     General: No focal deficit present.     Mental Status: He is alert.    Assessment/Plan:  AGree with aggressive medical management of PE. I feel safest to plan on postponing nephrectomy  as his clot is huge and symptomatic.   Would appreciate heme-onc eval to establish for PE comanagment and we will arrange for FU with [place] in abtou 6 weeks ro re-assess. Hopefully his clot will resolve more quickly and can reconsdier surgeyr in 54motime frame instead of usual 621mo  Greatly appreciate hospitalist comanagement.   ThAlexis Frock/08/2022, 8:14 AM

## 2022-03-12 NOTE — Hospital Course (Signed)
Marcus Burns is a 42 y.o. M with obesity, right renal mass pending nephrectomy who presented with pleuritic chest pain and DOE.  In the ER, CTA showed large right PE.  O2 normal and hemodynamically stable.  Admitted on heparin.

## 2022-03-12 NOTE — Assessment & Plan Note (Signed)
BP normal - Continue home amlodipine

## 2022-03-12 NOTE — Progress Notes (Signed)
  Progress Note   Patient: Marcus Burns SWN:[id number] DOB: 08/22/1980 DOA: 03/11/2022     1 DOS: the patient was seen and examined on 03/12/2022 at 9:05AM      Brief hospital course: Marcus Burns is a 42 y.o. M with obesity, right renal mass pending nephrectomy who presented with pleuritic chest pain and DOE.  In the ER, CTA showed large right PE.  O2 normal and hemodynamically stable.  Admitted on heparin.     Assessment and Plan: * Acute pulmonary embolism ([place]) Large right DVT with some pulmonary infarction.  Stable on room air, BP normal, echo without right heart strain, DVT US negative. - Stop heparin gtt - Start Eliquis    Obesity (BMI 30-39.9) BMI 38  Hypertension BP normal - Continue home amlodipine  Right renal mass Discussed with Urology, this will complicate surgery. - Referral to Hematology for question: how soon can anticoagulation reasonably be stopped for nephrectomy?          Subjective: Still with pleuritic chest pain, still dyspneic with exertion.  No hemoptysis, no fever, no hypoxia.     Physical Exam: BP (!) 149/86   Pulse 88   Temp 98.5 F (36.9 C) (Oral)   Resp (!) 25   Ht 6\' 4"  (1.93 m)   Wt (!) 143.3 kg   SpO2 95%   BMI 38.45 kg/m   Adult male, sitting up in bed, interactive and appropriate Tachycardic, regular, no murmurs, no peripheral edema, no JVD Respiratory rate seems normal, lungs clear without rales or wheezes although lung effort is diminished Abdomen soft without tenderness palpation Attention normal, affect appropriate, judgment and insight appear normal    Data Reviewed: Discussed with urology Anticoagulation manage Basic metabolic panel and CBC unremarkable Ultrasound of the bilateral lower extremities unremarkable Echocardiogram shows no right heart strain    Family Communication: Wife at the bedside    Disposition: Status is: Inpatient We will transition to oral anticoagulation and monitor  overnight  If he remains stable on room air, symptoms are managed with oral analgesics, and urology are able to see him, likely home tomorrow        Author: Edwin Dada, MD 03/12/2022 11:07 AM  For on call review [email or web address].

## 2022-03-12 NOTE — Assessment & Plan Note (Signed)
Mild increased today, possibly situational, ok to continue to follow with current med tx - norvasc 5 mg qd  BP Readings from Last 3 Encounters:  03/12/22 (!) 146/94  03/10/22 (!) 142/88  01/22/22 (!) 150/80

## 2022-03-12 NOTE — Discharge Instructions (Addendum)
Information on my medicine - ELIQUIS (apixaban)     Why was Eliquis prescribed for you? Eliquis was prescribed to treat blood clots that may have been found in the veins of your legs (deep vein thrombosis) or in your lungs (pulmonary embolism) and to reduce the risk of them occurring again.  What do You need to know about Eliquis ? The starting dose is 10 mg (two 5 mg tablets) taken TWICE daily for the FIRST SEVEN (7) DAYS, then on (enter date)  03/19/22  the dose is reduced to ONE 5 mg tablet taken TWICE daily.  Eliquis may be taken with or without food.   Try to take the dose about the same time in the morning and in the evening. If you have difficulty swallowing the tablet whole please discuss with your pharmacist how to take the medication safely.  Take Eliquis exactly as prescribed and DO NOT stop taking Eliquis without talking to the doctor who prescribed the medication.  Stopping may increase your risk of developing a new blood clot.  Refill your prescription before you run out.  After discharge, you should have regular check-up appointments with your healthcare provider that is prescribing your Eliquis.    What do you do if you miss a dose? If a dose of ELIQUIS is not taken at the scheduled time, take it as soon as possible on the same day and twice-daily administration should be resumed. The dose should not be doubled to make up for a missed dose.  Important Safety Information A possible side effect of Eliquis is bleeding. You should call your healthcare provider right away if you experience any of the following: Bleeding from an injury or your nose that does not stop. Unusual colored urine (red or dark brown) or unusual colored stools (red or black). Unusual bruising for unknown reasons. A serious fall or if you hit your head (even if there is no bleeding).  Some medicines may interact with Eliquis and might increase your risk of bleeding or clotting while on Eliquis.  To help avoid this, consult your healthcare provider or pharmacist prior to using any new prescription or non-prescription medications, including herbals, vitamins, non-steroidal anti-inflammatory drugs (NSAIDs) and supplements.  This website has more information on Eliquis (apixaban): http://www.eliquis.com/eliquis/home

## 2022-03-12 NOTE — Assessment & Plan Note (Signed)
BMI 38 

## 2022-03-12 NOTE — Assessment & Plan Note (Signed)
For surgical management mar 20 per urology

## 2022-03-12 NOTE — TOC Benefit Eligibility Note (Signed)
Patient Teacher, English as a foreign language completed.    The patient is currently admitted and upon discharge could be taking Eliquis 5 mg.  The current 30 day co-pay is $40.00.   The patient is insured through [place], [place] Patient [place] Patient Advocate Team Direct Number: [phone]  Fax: [phone]

## 2022-03-12 NOTE — Assessment & Plan Note (Signed)
Large right DVT with some pulmonary infarction.  Stable on room air, BP normal, echo without right heart strain, DVT US negative. - Stop heparin gtt - Start Eliquis

## 2022-03-12 NOTE — Progress Notes (Signed)
ANTICOAGULATION CONSULT NOTE - Follow Up Consult  Pharmacy Consult for Heparin Indication: pulmonary embolus  Allergies  Allergen Reactions   Doxycycline Other (See Comments)    Light headed , passed out    Patient Measurements: Height: '6\' 4"'$  (193 cm) Weight: (!) 143.3 kg (315 lb 14.7 oz) IBW/kg (Calculated) : 86.8 Heparin Dosing Weight: 119 kg  Vital Signs: Temp: 98.8 F (37.1 C) (03/08 0444) Temp Source: Oral (03/08 0444) BP: 148/90 (03/08 0400) Pulse Rate: 89 (03/08 0400)  Labs: Recent Labs    03/10/22 1120 03/11/22 0146 03/11/22 0916 03/11/22 1613 03/12/22 0340  HGB 13.5 12.7*  --   --  12.2*  HCT 40.1 39.0  --   --  38.6*  PLT 310.0 277  --   --  260  APTT  --  30  --   --   --   LABPROT  --  13.5  --   --   --   INR  --  1.0  --   --   --   HEPARINUNFRC  --   --  0.44 0.32 0.19*  CREATININE 1.25 1.21  --   --  1.28*  TROPONINIHS  --  <2  --   --   --      Estimated Creatinine Clearance: 116.3 mL/min (A) (by C-G formula based on SCr of 1.28 mg/dL (H)).   Medications:  Infusions:   heparin 2,000 Units/hr (03/11/22 1800)    Assessment: 31 yoM presented on 3/7 with complaint of right side pleuritic pain and shortness of breath. No hx of DVT PE but now has known probable right renal malignancy. Pharmacy to dose heparin for PE, no prior AC noted.  Pharmacy is consulted to dose Heparin for acute PE.   Today, 03/12/2022: Heparin level 0.19,sub therapeutic on heparin 2000 units/hr CBC: Hgb down to 12.2, Plt 260 No bleeding or interruptions per RN   Goal of Therapy:  Heparin level 0.3-0.7 units/ml Monitor platelets by anticoagulation protocol: Yes   Plan:  Bolus heparin drip 3500 units x 1 increase heparin IV infusion to 2300 units/hr Heparin level in 6 hours Daily heparin level and CBC Monitor for signs and symptoms of bleeding   Dolly Rias RPh 03/12/2022, 4:51 AM

## 2022-03-12 NOTE — Progress Notes (Signed)
PT maintained oxygen saturation of 95-97% while ambulating and performing morning care routines. PT reported less discomfort breathing since taking pain medication as prescribed. At home eliquis dose and instructions given by pharmacy- wife plans on taking medication home with her today. Meeting goals currently for transfer/discharge.

## 2022-03-12 NOTE — Assessment & Plan Note (Signed)
Discussed with Urology, this will complicate surgery. - Referral to Hematology for question: how soon can anticoagulation reasonably be stopped for nephrectomy?

## 2022-03-12 NOTE — Assessment & Plan Note (Signed)
Etiology unclear, for cxr r/o pna, also d dimer with labs given hx of probable malignancy and can't r/o PE

## 2022-03-12 NOTE — TOC Progression Note (Signed)
Transition of Care [facility]) - Progression Note   Patient Details  Name: Marcus Burns MRN: [id number] Date of Birth: 05/31/1980  [facility]) CM/SW [place], LCSW Phone Number: 03/12/2022, 11:05 AM  Clinical Narrative: [facility] consulted by ED PA for benefits check for medications, but hospitalist to coordinate with pharmacy. TOC signing off, but can be consulted again if needed.    Barriers to Discharge: Continued Medical Work up  Social Determinants of Health (SDOH) Interventions SDOH Screenings   Food Insecurity: No Food Insecurity (03/11/2022)  Housing: Low Risk  (03/11/2022)  Transportation Needs: No Transportation Needs (03/11/2022)  Utilities: Not At Risk (03/11/2022)  Depression (PHQ2-9): Medium Risk (03/10/2022)  Tobacco Use: Medium Risk (03/11/2022)   Readmission Risk Interventions     No data to display

## 2022-03-13 DIAGNOSIS — J189 Pneumonia, unspecified organism: Secondary | ICD-10-CM

## 2022-03-13 MED ORDER — SODIUM CHLORIDE 0.9 % IV SOLN
2.0000 g | INTRAVENOUS | Status: DC
  Administered 2022-03-13: 2 g via INTRAVENOUS
  Filled 2022-03-13: qty 20

## 2022-03-13 NOTE — Assessment & Plan Note (Signed)
New fever overnight.  No new symptoms other than cough, chest discomfort. - Start Rocephin

## 2022-03-13 NOTE — Progress Notes (Signed)
  Progress Note   Patient: Marcus Burns [id number] DOB: Mar 07, 1980 DOA: 03/11/2022     2 DOS: the patient was seen and examined on 03/13/2022        Brief hospital course: Mr. Angert is a 42 y.o. M with obesity, right renal mass pending nephrectomy who presented with pleuritic chest pain and DOE.  In the ER, CTA showed large right PE.  O2 normal and hemodynamically stable.  Admitted on heparin.     Assessment and Plan: * Acute pulmonary embolism ([place]) Large right DVT with some pulmonary infarction.  Stable on room air, BP normal, echo without right heart strain, DVT US negative. - Continue Eliquis   CAP (community acquired pneumonia) New fever overnight.  No new symptoms other than cough, chest discomfort. - Start Rocephin  Obesity (BMI 30-39.9) BMI 38  Hypertension BP normal - Continue home amlodipine  Right renal mass Discussed with Urology, this will complicate surgery. - Referral to Hematology for question: how soon can anticoagulation reasonably be stopped for nephrectomy?          Subjective: Up and walking, feeling okay.  Still with chest discomfort, pleuritic pain.  New fever ovenright.  No other new focal symptoms.     Physical Exam: BP (!) 151/91 (BP Location: Left Arm)   Pulse 99   Temp 98.2 F (36.8 C) (Oral)   Resp 18   Ht 6\' 4"  (1.93 m)   Wt 135.9 kg   SpO2 100%   BMI 36.48 kg/m   Adult male, lying in bed, interactive and appropriate RRR, no murmurs, no peripheral edema Respiratory normal, lungs clear without rales or wheezes Abdomen soft without tenderness palpation Attention normal, affect normal, judgment insight appear normal    Data Reviewed:   Family Communication: Wife at the bedside    Disposition: Status is: Inpatient Requires IV antibiotics        Author: Edwin Dada, MD 03/13/2022 4:22 PM  For on call review [email or web address]

## 2022-03-14 LAB — CBC
HCT: 36.5 % — ABNORMAL LOW (ref 39.0–52.0)
Hemoglobin: 11.7 g/dL — ABNORMAL LOW (ref 13.0–17.0)
MCH: 28 pg (ref 26.0–34.0)
MCHC: 32.1 g/dL (ref 30.0–36.0)
MCV: 87.3 fL (ref 80.0–100.0)
Platelets: 320 10*3/uL (ref 150–400)
RBC: 4.18 MIL/uL — ABNORMAL LOW (ref 4.22–5.81)
RDW: 13.4 % (ref 11.5–15.5)
WBC: 11.1 10*3/uL — ABNORMAL HIGH (ref 4.0–10.5)
nRBC: 0 % (ref 0.0–0.2)

## 2022-03-14 LAB — BASIC METABOLIC PANEL
Anion gap: 13 (ref 5–15)
BUN: 14 mg/dL (ref 6–20)
CO2: 24 mmol/L (ref 22–32)
Calcium: 9.5 mg/dL (ref 8.9–10.3)
Chloride: 98 mmol/L (ref 98–111)
Creatinine, Ser: 1.11 mg/dL (ref 0.61–1.24)
GFR, Estimated: >60 mL/min (ref >60)
Glucose, Bld: 106 mg/dL — ABNORMAL HIGH (ref 70–99)
Potassium: 3.8 mmol/L (ref 3.5–5.1)
Sodium: 135 mmol/L (ref 135–145)

## 2022-03-14 MED ORDER — APIXABAN 5 MG PO TABS: 5.0000 mg | ORAL_TABLET | Freq: Two times a day (BID) | ORAL | 1 refills | Status: DC

## 2022-03-14 MED ORDER — SODIUM CHLORIDE 0.9 % IV SOLN
2.0000 g | INTRAVENOUS | Status: DC
  Administered 2022-03-14: 2 g via INTRAVENOUS
  Filled 2022-03-14: qty 20

## 2022-03-14 MED ORDER — AMOXICILLIN-POT CLAVULANATE 875-125 MG PO TABS: 1.0000 | ORAL_TABLET | Freq: Two times a day (BID) | ORAL | 0 refills | Status: DC

## 2022-03-14 MED ORDER — OXYCODONE HCL 5 MG PO TABS: 5.0000 mg | ORAL_TABLET | Freq: Four times a day (QID) | ORAL | 0 refills | Status: DC | PRN

## 2022-03-14 NOTE — Plan of Care (Signed)

## 2022-03-14 NOTE — Discharge Summary (Signed)
Physician Discharge Summary   Patient: Marcus Burns MRN: [id number] DOB: 1980/09/24  Admit date:     03/11/2022  Discharge date: 03/14/22  Discharge Physician: Edwin Dada   PCP: Biagio Borg, MD     Recommendations at discharge:  Follow up with PCP Dr. Jenny Reichmann for new PE, pulmonary infarction and pneumonia Follow up with Urology Dr. Tresa Moore for renal mass Follow up with Hematology Dr. Chryl Heck for risk/benefit assessment of holding anticoagulation for nephrectomy prior to 6 months     Discharge Diagnoses: Principal Problem:   Acute pulmonary embolism ([place]) Active Problems:   Pulmonary infarction   Right lower lobe pneumonia    Right renal mass   Hypertension   Obesity (BMI 30-39.9)       Hospital Course: Marcus Burns is a 42 y.o. M with obesity, right renal mass pending nephrectomy who presented with pleuritic chest pain and DOE.  In the ER, CTA showed large right PE.  O2 normal and hemodynamically stable.  Admitted on heparin.       * Acute pulmonary embolism (HCC) Large right DVT with some pulmonary infarction.    Echo without right heart strain, DVT US negative.  Transitioned to Eliquis, remained hemodynamically stable and on room air throughout stay.  Pain controlled with oxycodone.   Given that nephrectomy is time-sensitive, but there is uncertainty about the risk/benefit of holding anticoagulation prior to 6 months, we have referred to Hematology.   CAP (community acquired pneumonia) Developed fever to 101F on hospital day 2.  Can't differentiate fever due to pulmonary infarction from actual infection, will err on caution and treat with 5 days antibiotics, discharged on Augmentin.  Obesity (BMI 30-39.9) BMI 38  Hypertension Follow up with PCP  Right renal mass Follow up with Urology            The [place] Registry was reviewed for this patient prior to discharge.   Consultants:   Urology  Procedures performed:   CTA chest Echo US Lower extremities   Disposition: Home Diet recommendation:  Regular  DISCHARGE MEDICATION: Allergies as of 03/14/2022       Reactions   Doxycycline Other (See Comments)   Light headed , passed out        Medication List     STOP taking these medications    levofloxacin 500 MG tablet Commonly known as: Levaquin       TAKE these medications    acetaminophen 500 MG tablet Commonly known as: TYLENOL Take 1,000 mg by mouth every 6 (six) hours as needed (pain.).   albuterol 108 (90 Base) MCG/ACT inhaler Commonly known as: VENTOLIN HFA Inhale 2 puffs into the lungs every 6 (six) hours as needed for wheezing or shortness of breath.   amoxicillin-clavulanate 875-125 MG tablet Commonly known as: AUGMENTIN Take 1 tablet by mouth 2 (two) times daily.   APPLE CIDER VINEGAR PO Take 1 capsule by mouth daily.   cetirizine 10 MG tablet Commonly known as: ZYRTEC Take 10 mg by mouth daily as needed for allergies.   clonazePAM 0.5 MG tablet Commonly known as: KLONOPIN Take 1 tablet (0.5 mg total) by mouth 2 (two) times daily as needed for anxiety.   Eliquis DVT/PE Starter Pack Generic drug: Apixaban Starter Pack ('10mg'$  and '5mg'$ ) Take as directed on package: start with two-'5mg'$  tablets twice daily for 7 days. On day 8, switch to one-'5mg'$  tablet twice daily.   apixaban 5 MG Tabs tablet Commonly known as: Eliquis Take  1 tablet (5 mg total) by mouth 2 (two) times daily. Start taking on: April 12, 2022   gabapentin 300 MG capsule Commonly known as: NEURONTIN Take 1 capsule (300 mg total) by mouth 3 (three) times daily. What changed:  how much to take when to take this reasons to take this   ibuprofen 200 MG tablet Commonly known as: ADVIL Take 600 mg by mouth every 8 (eight) hours as needed (for pain.).   multivitamin with minerals Tabs tablet Take 1 tablet by mouth at bedtime.   oxyCODONE 5 MG immediate release  tablet Commonly known as: Oxy IR/ROXICODONE Take 1 tablet (5 mg total) by mouth every 6 (six) hours as needed for moderate pain or breakthrough pain.   Vitamin D 50 MCG (2000 UT) tablet Take 2,000 Units by mouth daily.        Follow-up Information     Alexis Frock, MD Follow up.   Specialty: Urology Why: Office will call to arrange FU wtih Dr. Tresa Moore in about a month. Contact information: [place] [address] [phone]         Biagio Borg, MD. Schedule an appointment as soon as possible for a visit in 1 week(s).   Specialties: Internal Medicine, Radiology Contact information: [place] [address] [phone]                 Discharge Instructions     Ambulatory referral to Hematology / Oncology   Complete by: As directed    For new DVT in setting of renal cancer requiring nephrectomy by Dr. Tresa Moore: What is soonest patient could hold anticoagulation for surgery?   Discharge instructions   Complete by: As directed    **IMPORTANT DISCHARGE INSTRUCTIONS**   From Dr. Loleta Books: You were admitted for a blood clot in the chest (a "pulmonary embolism")  You were treated with heparin infusion and then started on Eliquis (apixaban)  Take apixaban/Eliquis 10 mg (two tabs) twice daily for the next four days (to complete the 7 day loading period)  Starting Friday Mar 15, reduce to Eliquis 5 mg twice daily  You will need to take this for several months  In order to determine the timing of surgery, go see Hematology Dr. Chryl Heck later this month (see below in To Do section for date and time)  Follow up with Dr. Tresa Moore as he instructed  You had ultrasounds of the legs that showed no more clot left in the legs (it probably started there before traveling to the lungs)  You had an echo of the heart that showed good heart function  You may have had a pneumonia due to the "pulmonary infarctions" and so you shold take antibiotics with  Augmentin for a 5 day course  YOu got two days of antibiotics here, so starting Monday monring, take Augmentin 875-125 mg twice daily for 3 more days  Go see Dr. Jenny Reichmann in 1 week   Increase activity slowly   Complete by: As directed        Discharge Exam: Filed Weights   03/11/22 0129 03/11/22 0600 03/13/22 0558  Weight: (!) 142.4 kg (!) 143.3 kg 135.9 kg    General: Pt is alert, awake, not in acute distress Cardiovascular: RRR, nl S1-S2, no murmurs appreciated.   No LE edema.   Respiratory: Normal respiratory rate and rhythm.  CTAB without rales or wheezes. Abdominal: Abdomen soft and non-tender.  No distension or HSM.   Neuro/Psych: Strength symmetric in  upper and lower extremities.  Judgment and insight appear normal.   Condition at discharge: good  The results of significant diagnostics from this hospitalization (including imaging, microbiology, ancillary and laboratory) are listed below for reference.   Imaging Studies: VAS [place] LOWER EXTREMITY VENOUS (DVT)  Result Date: 03/11/2022  Lower Venous DVT Study Patient Name:  PATTRICK RAMIRES  Date of Exam:   03/11/2022 Medical Rec #: [id number]          Accession #:    [id number] Date of Birth: 1980/03/19          Patient Gender: M Patient Age:   65 years Exam Location:  [facility] Procedure:      VAS [place] LOWER EXTREMITY VENOUS (DVT) Referring Phys: DAVID ORTIZ --------------------------------------------------------------------------------  Indications: Pulmonary embolism.  Comparison Study: No previous exams Performing Technologist: Jody Hill RVT, RDMS  Examination Guidelines: A complete evaluation includes B-mode imaging, spectral Doppler, color Doppler, and power Doppler as needed of all accessible portions of each vessel. Bilateral testing is considered an integral part of a complete examination. Limited examinations for reoccurring indications may be performed as noted. The reflux portion of the exam is performed with the patient  in reverse Trendelenburg.  +---------+---------------+---------+-----------+----------+--------------+ RIGHT    CompressibilityPhasicitySpontaneityPropertiesThrombus Aging +---------+---------------+---------+-----------+----------+--------------+ CFV      Full           Yes      Yes                                 +---------+---------------+---------+-----------+----------+--------------+ SFJ      Full                                                        +---------+---------------+---------+-----------+----------+--------------+ FV Prox  Full           Yes      Yes                                 +---------+---------------+---------+-----------+----------+--------------+ FV Mid   Full           Yes      Yes                                 +---------+---------------+---------+-----------+----------+--------------+ FV DistalFull           Yes      Yes                                 +---------+---------------+---------+-----------+----------+--------------+ PFV      Full                                                        +---------+---------------+---------+-----------+----------+--------------+ POP      Full           Yes      Yes                                 +---------+---------------+---------+-----------+----------+--------------+  PTV      Full                                                        +---------+---------------+---------+-----------+----------+--------------+ PERO     Full                                                        +---------+---------------+---------+-----------+----------+--------------+   +---------+---------------+---------+-----------+----------+--------------+ LEFT     CompressibilityPhasicitySpontaneityPropertiesThrombus Aging +---------+---------------+---------+-----------+----------+--------------+ CFV      Full           Yes      Yes                                  +---------+---------------+---------+-----------+----------+--------------+ SFJ      Full                                                        +---------+---------------+---------+-----------+----------+--------------+ FV Prox  Full           Yes      Yes                                 +---------+---------------+---------+-----------+----------+--------------+ FV Mid   Full           Yes      Yes                                 +---------+---------------+---------+-----------+----------+--------------+ FV DistalFull           No       Yes                                 +---------+---------------+---------+-----------+----------+--------------+ PFV      Full                                                        +---------+---------------+---------+-----------+----------+--------------+ POP      Full           Yes      Yes                                 +---------+---------------+---------+-----------+----------+--------------+ PTV      Full                                                        +---------+---------------+---------+-----------+----------+--------------+  PERO     Full                                                        +---------+---------------+---------+-----------+----------+--------------+     Summary: BILATERAL: - No evidence of deep vein thrombosis seen in the lower extremities, bilaterally. -No evidence of popliteal cyst, bilaterally.   *See table(s) above for measurements and observations. Electronically signed by Orlie Pollen on 03/11/2022 at 4:18:57 PM.    Final    ECHOCARDIOGRAM COMPLETE  Result Date: 03/11/2022    ECHOCARDIOGRAM REPORT   Patient Name:   TERVON KUYPER Date of Exam: 03/11/2022 Medical Rec #:  [id number]         Height:       76.0 in Accession #:    [id number]        Weight:       315.9 lb Date of Birth:  08/27/1980         BSA:          2.690 m Patient Age:    89 years          BP:           149/101 mmHg  Patient Gender: M                 HR:           90 bpm. Exam Location:  Inpatient Procedure: 2D Echo and Intracardiac Opacification Agent Indications:    pulmonary embolus  History:        Patient has no prior history of Echocardiogram examinations.  Sonographer:    Harvie Junior Referring Phys: [id number] Rhetta Mura  Sonographer Comments: Technically difficult study due to poor echo windows, patient is obese and no subcostal window. Image acquisition challenging due to patient body habitus, Image acquisition challenging due to respiratory motion and lung artifact. IMPRESSIONS  1. Left ventricular ejection fraction, by estimation, is 60 to 65%. The left ventricle has normal function. The left ventricle has no regional wall motion abnormalities. Left ventricular diastolic parameters were normal.  2. Right ventricular systolic function is normal. The right ventricular size is normal. There is normal pulmonary artery systolic pressure.  3. The mitral valve is normal in structure. No evidence of mitral valve regurgitation. No evidence of mitral stenosis.  4. The aortic valve is normal in structure. Aortic valve regurgitation is not visualized. No aortic stenosis is present.  5. The inferior vena cava is normal in size with greater than 50% respiratory variability, suggesting right atrial pressure of 3 mmHg. FINDINGS  Left Ventricle: Left ventricular ejection fraction, by estimation, is 60 to 65%. The left ventricle has normal function. The left ventricle has no regional wall motion abnormalities. Definity contrast agent was given IV to delineate the left ventricular  endocardial borders. The left ventricular internal cavity size was normal in size. There is no left ventricular hypertrophy. Left ventricular diastolic parameters were normal. Right Ventricle: The right ventricular size is normal. No increase in right ventricular wall thickness. Right ventricular systolic function is normal. There is normal pulmonary  artery systolic pressure. The tricuspid regurgitant velocity is 1.20 m/s, and  with an assumed right atrial pressure of 3 mmHg, the estimated right ventricular systolic pressure is 8.8 mmHg. Left Atrium: Left atrial size was normal in size. Right  Atrium: Right atrial size was normal in size. Pericardium: There is no evidence of pericardial effusion. Mitral Valve: The mitral valve is normal in structure. No evidence of mitral valve regurgitation. No evidence of mitral valve stenosis. Tricuspid Valve: The tricuspid valve is normal in structure. Tricuspid valve regurgitation is not demonstrated. No evidence of tricuspid stenosis. Aortic Valve: The aortic valve is normal in structure. Aortic valve regurgitation is not visualized. No aortic stenosis is present. Aortic valve mean gradient measures 4.0 mmHg. Aortic valve peak gradient measures 7.3 mmHg. Aortic valve area, by VTI measures 3.38 cm. Pulmonic Valve: The pulmonic valve was normal in structure. Pulmonic valve regurgitation is not visualized. No evidence of pulmonic stenosis. Aorta: The aortic root is normal in size and structure. Venous: The inferior vena cava is normal in size with greater than 50% respiratory variability, suggesting right atrial pressure of 3 mmHg. IAS/Shunts: No atrial level shunt detected by color flow Doppler.  LEFT VENTRICLE PLAX 2D LVIDd:         4.50 cm   Diastology LVIDs:         2.80 cm   LV e' medial:    10.00 cm/s LV PW:         1.00 cm   LV E/e' medial:  6.1 LV IVS:        1.00 cm   LV e' lateral:   16.10 cm/s LVOT diam:     2.20 cm   LV E/e' lateral: 3.8 LV SV:         77 LV SV Index:   29 LVOT Area:     3.80 cm  RIGHT VENTRICLE RV S prime:     15.30 cm/s LEFT ATRIUM           Index        RIGHT ATRIUM           Index LA diam:      3.60 cm 1.34 cm/m   RA Area:     13.90 cm LA Vol (A4C): 68.1 ml 25.31 ml/m  RA Volume:   34.80 ml  12.94 ml/m  AORTIC VALVE                    PULMONIC VALVE AV Area (Vmax):    3.55 cm     PV  Vmax:       1.47 m/s AV Area (Vmean):   3.39 cm     PV Peak grad:  8.6 mmHg AV Area (VTI):     3.38 cm AV Vmax:           135.00 cm/s AV Vmean:          89.900 cm/s AV VTI:            0.227 m AV Peak Grad:      7.3 mmHg AV Mean Grad:      4.0 mmHg LVOT Vmax:         126.00 cm/s LVOT Vmean:        80.100 cm/s LVOT VTI:          0.202 m LVOT/AV VTI ratio: 0.89  AORTA Ao Root diam: 3.60 cm Ao Asc diam:  3.60 cm MITRAL VALVE               TRICUSPID VALVE MV Area (PHT): 4.06 cm    TR Peak grad:   5.8 mmHg MV Decel Time: 187 msec    TR Vmax:  120.00 cm/s MV E velocity: 60.70 cm/s MV A velocity: 69.60 cm/s  SHUNTS MV E/A ratio:  0.87        Systemic VTI:  0.20 m                            Systemic Diam: 2.20 cm Dani Gobble Croitoru MD Electronically signed by Sanda Klein MD Signature Date/Time: 03/11/2022/1:28:22 PM    Final    CT Angio Chest PE W/Cm &/Or Wo Cm  Result Date: 03/11/2022 CLINICAL DATA:  Shortness of breath for several days on the right, history of recent right renal cell carcinoma diagnosis EXAM: CT ANGIOGRAPHY CHEST WITH CONTRAST TECHNIQUE: Multidetector CT imaging of the chest was performed using the standard protocol during bolus administration of intravenous contrast. Multiplanar CT image reconstructions and MIPs were obtained to evaluate the vascular anatomy. RADIATION DOSE REDUCTION: This exam was performed according to the departmental dose-optimization program which includes automated exposure control, adjustment of the mA and/or kV according to patient size and/or use of iterative reconstruction technique. CONTRAST:  166m OMNIPAQUE IOHEXOL 350 MG/ML SOLN COMPARISON:  02/26/2022 FINDINGS: Cardiovascular: Thoracic aorta shows no aneurysmal dilatation or dissection. Pulmonary artery demonstrates a normal branching pattern bilaterally. Large central right pulmonary embolus is noted with extension into the segmental and subsegmental branches primarily in the lower lobe. RV/LV ratio is greater  than 1 consistent with right heart strain. Heart is enlarged in size. No coronary calcifications are noted. Mediastinum/Nodes: Thoracic inlet is within normal limits. No hilar or mediastinal adenopathy is noted. The esophagus as visualized is within normal limits. Lungs/Pleura: Left lung is well aerated and clear. Right lung demonstrates several pleural based rounded areas of increased airspace opacity in the lower lobe likely representing early pulmonary infarcts. No sizable parenchymal nodule is noted. No effusion is seen. Upper Abdomen: There is again visualized in the upper pole mass lesion in the right kidney incompletely evaluated on this exam but stable from the prior study. No other focal abnormality in the upper abdomen is noted. Musculoskeletal: Degenerative changes of the thoracic spine are noted. No acute rib abnormality is noted. Review of the MIP images confirms the above findings. IMPRESSION: Extensive right-sided pulmonary emboli with evidence of right heart strain. Pleural based somewhat rounded areas of increased airspace opacity in the right lower lobe likely representing early changes of pulmonary infarct. Changes consistent with the known history of right renal cell carcinoma. Critical Value/emergent results were called by telephone at the time of interpretation on 03/11/2022 at 2:47 am to [facility] who verbally acknowledged these results. Electronically Signed   By: MInez CatalinaM.D.   On: 03/11/2022 02:49   DG Chest 2 View  Result Date: 03/10/2022 CLINICAL DATA:  Right lateral pleuritic chest pain. Cough. Former smoker. EXAM: CHEST - 2 VIEW COMPARISON:  None Available. FINDINGS: An ill-defined rounded density is identified in the periphery of the right lung base on the frontal view. No other pulmonary nodule, mass, or infiltrate. The cardiomediastinal silhouette is normal. No pneumothorax. IMPRESSION: 1. An ill-defined rounded density is projected in the periphery of the right lung base.  This could represent an infectious or inflammatory process. A pulmonary nodule is not excluded on this single study. Recommend a short-term follow-up x-ray in 2 or 3 weeks to ensure resolution of the finding. If the finding does not resolve, recommend a CT scan. Electronically Signed   By: DDorise BullionIII M.D.   On: 03/10/2022 11:34  Microbiology: Results for orders placed or performed during the hospital encounter of 03/11/22  MRSA Next Gen by PCR, Nasal     Status: Abnormal   Collection Time: 03/11/22  5:39 AM   Specimen: Nasal Mucosa; Nasal Swab  Result Value Ref Range Status   MRSA by PCR Next Gen DETECTED (A) NOT DETECTED Final    Comment: RESULT CALLED TO, READ BACK BY AND VERIFIED WITH: MAY, B. RN AT 0745 ON 03/11/2022 BY MECIAL J. (NOTE) The GeneXpert MRSA Assay (FDA approved for NASAL specimens only), is one component of a comprehensive MRSA colonization surveillance program. It is not intended to diagnose MRSA infection nor to guide or monitor treatment for MRSA infections. Test performance is not FDA approved in patients less than 32 years old. Performed at [facility], [place] [address] [place], [place] [address]     Labs: CBC: Recent Labs  Lab 03/10/22 1120 03/11/22 0146 03/12/22 0340 03/14/22 0616  WBC 7.2 7.9 9.6 11.1*  NEUTROABS 5.5 5.1  --   --   HGB 13.5 12.7* 12.2* 11.7*  HCT 40.1 39.0 38.6* 36.5*  MCV 85.6 87.2 88.3 87.3  PLT 310.0 277 260 [id number]   Basic Metabolic Panel: Recent Labs  Lab 03/10/22 1120 03/11/22 0146 03/12/22 0340 03/14/22 0616  NA 141 135 138 135  K 4.3 3.5 4.0 3.8  CL 102 103 101 98  CO2 '29 26 26 24  '$ GLUCOSE 101* 113* 110* 106*  BUN '11 14 13 14  '$ CREATININE 1.25 1.21 1.28* 1.11  CALCIUM 10.4 9.4 9.3 9.5  MG  --  2.2  --   --   PHOS  --  4.0  --   --    Liver Function Tests: Recent Labs  Lab 03/10/22 1120 03/11/22 0146 03/12/22 0340  AST 14 18 14*  ALT '11 14 12  '$ ALKPHOS 79 76 70  BILITOT 1.0 0.9  1.1  PROT 8.4* 8.5* 8.1  ALBUMIN 4.0 3.9 3.7   CBG: No results for input(s): "GLUCAP" in the last 168 hours.  Discharge time spent: approximately 35 minutes spent on discharge counseling, evaluation of patient on day of discharge, and coordination of discharge planning with nursing, social work, pharmacy and case management  Signed: Edwin Dada, MD [facility] 03/14/2022

## 2022-03-14 NOTE — Plan of Care (Signed)
  Problem: Education: Goal: Knowledge of General Education information will improve Description: Including pain rating scale, medication(s)/side effects and non-pharmacologic comfort measures 03/14/2022 1134 by Vernie Shanks, RN Outcome: Completed/Met 03/14/2022 1100 by Vernie Shanks, RN Outcome: Progressing   Problem: Health Behavior/Discharge Planning: Goal: Ability to manage health-related needs will improve 03/14/2022 1134 by Vernie Shanks, RN Outcome: Completed/Met 03/14/2022 1100 by Vernie Shanks, RN Outcome: Progressing   Problem: Clinical Measurements: Goal: Ability to maintain clinical measurements within normal limits will improve 03/14/2022 1134 by Vernie Shanks, RN Outcome: Completed/Met 03/14/2022 1100 by Vernie Shanks, RN Outcome: Progressing Goal: Will remain free from infection 03/14/2022 1134 by Vernie Shanks, RN Outcome: Completed/Met 03/14/2022 1100 by Vernie Shanks, RN Outcome: Progressing Goal: Diagnostic test results will improve 03/14/2022 1134 by Vernie Shanks, RN Outcome: Completed/Met 03/14/2022 1100 by Vernie Shanks, RN Outcome: Progressing Goal: Respiratory complications will improve 03/14/2022 1134 by Vernie Shanks, RN Outcome: Completed/Met 03/14/2022 1100 by Vernie Shanks, RN Outcome: Progressing Goal: Cardiovascular complication will be avoided 03/14/2022 1134 by Vernie Shanks, RN Outcome: Completed/Met 03/14/2022 1100 by Vernie Shanks, RN Outcome: Progressing   Problem: Activity: Goal: Risk for activity intolerance will decrease 03/14/2022 1134 by Vernie Shanks, RN Outcome: Completed/Met 03/14/2022 1100 by Vernie Shanks, RN Outcome: Progressing   Problem: Nutrition: Goal: Adequate nutrition will be maintained 03/14/2022 1134 by Vernie Shanks, RN Outcome: Completed/Met 03/14/2022 1100 by Vernie Shanks, RN Outcome: Progressing   Problem: Coping: Goal: Level of anxiety will  decrease 03/14/2022 1134 by Vernie Shanks, RN Outcome: Completed/Met 03/14/2022 1100 by Vernie Shanks, RN Outcome: Progressing   Problem: Elimination: Goal: Will not experience complications related to bowel motility 03/14/2022 1134 by Vernie Shanks, RN Outcome: Completed/Met 03/14/2022 1100 by Vernie Shanks, RN Outcome: Progressing Goal: Will not experience complications related to urinary retention 03/14/2022 1134 by Vernie Shanks, RN Outcome: Completed/Met 03/14/2022 1100 by Vernie Shanks, RN Outcome: Progressing   Problem: Pain Managment: Goal: General experience of comfort will improve 03/14/2022 1134 by Vernie Shanks, RN Outcome: Completed/Met 03/14/2022 1100 by Vernie Shanks, RN Outcome: Progressing   Problem: Safety: Goal: Ability to remain free from injury will improve 03/14/2022 1134 by Vernie Shanks, RN Outcome: Completed/Met 03/14/2022 1100 by Vernie Shanks, RN Outcome: Progressing   Problem: Skin Integrity: Goal: Risk for impaired skin integrity will decrease 03/14/2022 1134 by Vernie Shanks, RN Outcome: Completed/Met 03/14/2022 1100 by Vernie Shanks, RN Outcome: Progressing

## 2022-03-15 ENCOUNTER — Telehealth: Payer: Self-pay

## 2022-03-15 NOTE — Transitions of Care (Post Inpatient/ED Visit) (Signed)
   03/15/2022  Name: Marcus Burns MRN: [id number] DOB: 07-01-1980  Today's TOC FU Call Status: Today's TOC FU Call Status:: Successful TOC FU Call Competed TOC FU Call Complete Date: 03/15/22  Transition Care Management Follow-up Telephone Call Date of Discharge: 03/14/22 Discharge Facility: [facility]) Type of Discharge: Inpatient Admission Primary Inpatient Discharge Diagnosis:: Acute pulmonary embolism How have you been since you were released from the hospital?: Better Any questions or concerns?: No  Items Reviewed: Did you receive and understand the discharge instructions provided?: Yes Medications obtained and verified?: Yes (Medications Reviewed) Any new allergies since your discharge?: No Dietary orders reviewed?: Yes Do you have support at home?: Yes  Home Care and Equipment/Supplies: [place]?: No Any new equipment or medical supplies ordered?: No  Functional Questionnaire: Do you need assistance with bathing/showering or dressing?: No Do you need assistance with meal preparation?: No Do you need assistance with eating?: No Do you have difficulty maintaining continence: No Do you need assistance with getting out of bed/getting out of a chair/moving?: No Do you have difficulty managing or taking your medications?: No  Folllow up appointments reviewed: PCP Follow-up appointment confirmed?: Yes Date of PCP follow-up appointment?: 03/22/22 Follow-up Provider: Dr Jenny Reichmann [facility] Follow-up appointment confirmed?: No (pt will call Dr Tresa Moore to make fu appt) Reason Specialist Follow-Up Not Confirmed: Patient has Specialist Provider Number and will Call for Appointment Do you need transportation to your follow-up appointment?: No Do you understand care options if your condition(s) worsen?: Yes-patient verbalized understanding    Seymour LPN [place] [facility] [phone]

## 2022-03-16 ENCOUNTER — Encounter: Payer: Self-pay | Admitting: Internal Medicine

## 2022-03-17 ENCOUNTER — Telehealth: Payer: Self-pay | Admitting: Internal Medicine

## 2022-03-17 ENCOUNTER — Encounter (HOSPITAL_COMMUNITY)
Admission: RE | Admit: 2022-03-17 | Discharge: 2022-03-17 | Disposition: A | Discharge status: Home / Self Care | Payer: 59 | Source: Ambulatory Visit | Attending: Internal Medicine | Admitting: Internal Medicine

## 2022-03-17 NOTE — Telephone Encounter (Signed)
Patient called and said he needs intermittent FMLA papers filled out by Dr. Jenny Reichmann. His wife will be bringing it by later today 03/17/22. He needs it due to his recent pulmonary embolism. Best callback number is [phone].

## 2022-03-17 NOTE — Telephone Encounter (Signed)
Patient dropped off document FMLA, to be filled out by provider. Patient requested to send it via Call Patient to pick up within 7-days. Document is located in providers tray at front office.Please advise at [facility] [phone]

## 2022-03-22 ENCOUNTER — Other Ambulatory Visit: Payer: Self-pay | Admitting: Internal Medicine

## 2022-03-22 ENCOUNTER — Encounter: Payer: Self-pay | Admitting: Internal Medicine

## 2022-03-22 ENCOUNTER — Ambulatory Visit (INDEPENDENT_AMBULATORY_CARE_PROVIDER_SITE_OTHER): Payer: 59 | Admitting: Internal Medicine

## 2022-03-22 VITALS — BP 138/86 | HR 92 | Temp 99.4°F | Ht 76.0 in | Wt 308.0 lb

## 2022-03-22 DIAGNOSIS — D509 Iron deficiency anemia, unspecified: Secondary | ICD-10-CM

## 2022-03-22 DIAGNOSIS — D649 Anemia, unspecified: Secondary | ICD-10-CM | POA: Insufficient documentation

## 2022-03-22 DIAGNOSIS — I2699 Other pulmonary embolism without acute cor pulmonale: Secondary | ICD-10-CM

## 2022-03-22 DIAGNOSIS — N2889 Other specified disorders of kidney and ureter: Secondary | ICD-10-CM | POA: Diagnosis not present

## 2022-03-22 DIAGNOSIS — R030 Elevated blood-pressure reading, without diagnosis of hypertension: Secondary | ICD-10-CM

## 2022-03-22 DIAGNOSIS — Z8701 Personal history of pneumonia (recurrent): Secondary | ICD-10-CM

## 2022-03-22 DIAGNOSIS — J189 Pneumonia, unspecified organism: Secondary | ICD-10-CM

## 2022-03-22 LAB — CBC WITH DIFFERENTIAL/PLATELET
Basophils Absolute: 0 10*3/uL (ref 0.0–0.1)
Basophils Relative: 0.7 % (ref 0.0–3.0)
Eosinophils Absolute: 0.1 10*3/uL (ref 0.0–0.7)
Eosinophils Relative: 2.3 % (ref 0.0–5.0)
HCT: 37.6 % — ABNORMAL LOW (ref 39.0–52.0)
Hemoglobin: 12.7 g/dL — ABNORMAL LOW (ref 13.0–17.0)
Lymphocytes Relative: 24.3 % (ref 12.0–46.0)
Lymphs Abs: 1.4 10*3/uL (ref 0.7–4.0)
MCHC: 33.7 g/dL (ref 30.0–36.0)
MCV: 85.1 fl (ref 78.0–100.0)
Monocytes Absolute: 0.4 10*3/uL (ref 0.1–1.0)
Monocytes Relative: 6.6 % (ref 3.0–12.0)
Neutro Abs: 3.8 10*3/uL (ref 1.4–7.7)
Neutrophils Relative %: 66.1 % (ref 43.0–77.0)
Platelets: 479 10*3/uL — ABNORMAL HIGH (ref 150.0–400.0)
RBC: 4.42 Mil/uL (ref 4.22–5.81)
RDW: 14.1 % (ref 11.5–15.5)
WBC: 5.7 10*3/uL (ref 4.0–10.5)

## 2022-03-22 LAB — BASIC METABOLIC PANEL
BUN: 14 mg/dL (ref 6–23)
CO2: 27 mEq/L (ref 19–32)
Calcium: 10.2 mg/dL (ref 8.4–10.5)
Chloride: 101 mEq/L (ref 96–112)
Creatinine, Ser: 1.37 mg/dL (ref 0.40–1.50)
GFR: 63.47 mL/min (ref >60.00)
Glucose, Bld: 96 mg/dL (ref 70–99)
Potassium: 4.3 mEq/L (ref 3.5–5.1)
Sodium: 140 mEq/L (ref 135–145)

## 2022-03-22 LAB — IBC PANEL
Iron: 37 ug/dL — ABNORMAL LOW (ref 42–165)
Saturation Ratios: 12.1 % — ABNORMAL LOW (ref 20.0–50.0)
TIBC: 305.2 ug/dL (ref 250.0–450.0)
Transferrin: 218 mg/dL (ref 212.0–360.0)

## 2022-03-22 LAB — HEPATIC FUNCTION PANEL
ALT: 14 U/L (ref 0–53)
AST: 17 U/L (ref 0–37)
Albumin: 3.9 g/dL (ref 3.5–5.2)
Alkaline Phosphatase: 71 U/L (ref 39–117)
Bilirubin, Direct: 0.1 mg/dL (ref 0.0–0.3)
Total Bilirubin: 0.5 mg/dL (ref 0.2–1.2)
Total Protein: 8.6 g/dL — ABNORMAL HIGH (ref 6.0–8.3)

## 2022-03-22 LAB — FERRITIN: Ferritin: 127.8 ng/mL (ref 22.0–322.0)

## 2022-03-22 MED ORDER — POLYSACCHARIDE IRON COMPLEX 150 MG PO CAPS: 150.0000 mg | ORAL_CAPSULE | Freq: Every day | ORAL | 1 refills | Status: DC

## 2022-03-22 NOTE — Assessment & Plan Note (Signed)
Improved symptoms, now on eliquis anticoagulation

## 2022-03-22 NOTE — Assessment & Plan Note (Signed)
Severe up to sbp 200 per pt while hospd with acute illness likely reactive, now improved with much lower BP last 2 days at home, ok to continue to monitor at home, hold further amlodipine for now

## 2022-03-22 NOTE — Progress Notes (Signed)
Patient ID: Marcus Burns, male   DOB: 1980/04/12, 42 y.o.   MRN: [id number]        Chief Complaint: follow up recent hospn with acute PE, possible pneumonia, BP elevation flare, and new mild anemia       HPI:  Marcus Burns is a 42 y.o. male here with above mar 7 - mar 10, CTA showed large right sided PE with small pulm infarction vs pna, oxygenation and hemodynamically stable, but due to large clot in lung was placed on eliquis and hospd for stabilization, LE venous dopplers neg, antibiotic changed to augmentin to which he has completed.  Also noted to have marked elevated BP and tx with norvasc while hospd, but given none at d/c, and checking at home has been steadily improving to today relatively well controlled.  Note on labs he had mild leukocytosis, and new slight anemia with Hgb 12.7, no overt bleeding, also had IVF tx as well.  Has f/u with heme planned for later this wk, with ? Of when to consider right renal mass removal per Dr Tresa Moore, and anticoagulation management / ? Long term recommendation.        Transitional Care Management elements noted today: 1)  Date of D/C: as above 2)  Medication reconciliation:  done today at end visit 3)  Review of D/C summary or other information:  done today 4)  Review of need for f/u on pending diagnostic tests and treatments:  done today - none needed 5)  Review of need for Interaction with other providers who will assume or resume care of pt specific problems: done today - for heme f/u later this wk 6)  Education of patient/family/guardian or caregiver: done today - wife present Wt Readings from Last 3 Encounters:  03/22/22 (!) 308 lb (139.7 kg)  03/13/22 299 lb 11.2 oz (135.9 kg)  03/10/22 (!) 314 lb (142.4 kg)   BP Readings from Last 3 Encounters:  03/22/22 138/86  03/14/22 125/87  03/10/22 (!) 142/88         Past Medical History:  Diagnosis Date   Acute prostatitis 06/19/2007   ALLERGIC RHINITIS 06/19/2007   ANXIETY 06/19/2007    ELEVATED BLOOD PRESSURE WITHOUT DIAGNOSIS OF HYPERTENSION 06/19/2007   GERD 06/19/2007   GERD (gastroesophageal reflux disease)    History of COVID-19 01/24/2019   History of kidney stones    Inguinal hernia    Migraines    NEPHROLITHIASIS, HX OF 06/19/2007   Past Surgical History:  Procedure Laterality Date   CYSTOSCOPY WITH RETROGRADE PYELOGRAM, URETEROSCOPY AND STENT PLACEMENT Bilateral 02/09/2019   Procedure: CYSTOSCOPY WITH RETROGRADE PYELOGRAM, DIAGNOSTIC URETEROSCOPY AND STENT PLACEMENT;  Surgeon: Alexis Frock, MD;  Location: [facility];  Service: Urology;  Laterality: Bilateral;  40 MINS   CYSTOSCOPY WITH RETROGRADE PYELOGRAM, URETEROSCOPY AND STENT PLACEMENT Bilateral 02/23/2019   Procedure: CYSTOSCOPY WITH RETROGRADE PYELOGRAM, URETEROSCOPY AND STENT PLACEMENT;  Surgeon: Alexis Frock, MD;  Location: [facility];  Service: Urology;  Laterality: Bilateral;  1 HR   HERNIA REPAIR     HOLMIUM LASER APPLICATION Bilateral [id number]   Procedure: HOLMIUM LASER APPLICATION;  Surgeon: Alexis Frock, MD;  Location: [facility];  Service: Urology;  Laterality: Bilateral;    reports that he has quit smoking. He has quit using smokeless tobacco. He reports that he does not drink alcohol and does not use drugs. family history includes Anxiety disorder in his brother and mother; Coronary artery disease in his father; Diabetes in his brother. Allergies  Allergen Reactions  Doxycycline Other (See Comments)    Light headed , passed out   Current Outpatient Medications on File Prior to Visit  Medication Sig Dispense Refill   acetaminophen (TYLENOL) 500 MG tablet Take 1,000 mg by mouth every 6 (six) hours as needed (pain.).     albuterol (VENTOLIN HFA) 108 (90 Base) MCG/ACT inhaler Inhale 2 puffs into the lungs every 6 (six) hours as needed for wheezing or shortness of breath. 8 g 1   amoxicillin-clavulanate (AUGMENTIN) 875-125 MG tablet Take 1 tablet by mouth 2 (two) times daily. 6 tablet 0   [START ON  04/12/2022] apixaban (ELIQUIS) 5 MG TABS tablet Take 1 tablet (5 mg total) by mouth 2 (two) times daily. 60 tablet 1   APIXABAN (ELIQUIS) VTE STARTER PACK (10MG  AND 5MG ) Take as directed on package: start with two-5mg  tablets twice daily for 7 days. On day 8, switch to one-5mg  tablet twice daily. 74 each 0   APPLE CIDER VINEGAR PO Take 1 capsule by mouth daily.     cetirizine (ZYRTEC) 10 MG tablet Take 10 mg by mouth daily as needed for allergies.     clonazePAM (KLONOPIN) 0.5 MG tablet Take 1 tablet (0.5 mg total) by mouth 2 (two) times daily as needed for anxiety. 60 tablet 1   gabapentin (NEURONTIN) 300 MG capsule Take 1 capsule (300 mg total) by mouth 3 (three) times daily. (Patient taking differently: Take 100 mg by mouth as needed (pain).) 90 capsule 3   oxyCODONE (OXY IR/ROXICODONE) 5 MG immediate release tablet Take 1 tablet (5 mg total) by mouth every 6 (six) hours as needed for moderate pain or breakthrough pain. 20 tablet 0   Cholecalciferol (VITAMIN D) 50 MCG (2000 UT) tablet Take 2,000 Units by mouth daily. (Patient not taking: Reported on 03/22/2022)     ibuprofen (ADVIL) 200 MG tablet Take 600 mg by mouth every 8 (eight) hours as needed (for pain.). (Patient not taking: Reported on 03/22/2022)     Multiple Vitamin (MULTIVITAMIN WITH MINERALS) TABS tablet Take 1 tablet by mouth at bedtime. (Patient not taking: Reported on 03/22/2022)     [DISCONTINUED] triamcinolone (NASACORT) 55 MCG/ACT AERO nasal inhaler Place 2 sprays into the nose daily. (Patient not taking: Reported on 02/01/2019) 1 Inhaler 12   No current facility-administered medications on file prior to visit.        ROS:  All others reviewed and negative.  Objective        PE:  BP 138/86 (BP Location: Left Arm, Patient Position: Sitting, Cuff Size: Normal)   Pulse 92   Temp 99.4 F (37.4 C) (Oral)   Ht 6\' 4"  (1.93 m)   Wt (!) 308 lb (139.7 kg)   SpO2 94%   BMI 37.49 kg/m                 Constitutional: Pt appears in NAD                HENT: Head: NCAT.                Right Ear: External ear normal.                 Left Ear: External ear normal.                Eyes: . Pupils are equal, round, and reactive to light. Conjunctivae and EOM are normal               Nose: without d/c or  deformity               Neck: Neck supple. Gross normal ROM               Cardiovascular: Normal rate and regular rhythm.                 Pulmonary/Chest: Effort normal and breath sounds without rales or wheezing.                Abd:  Soft, NT, ND, + BS, no organomegaly               Neurological: Pt is alert. At baseline orientation, motor grossly intact               Skin: Skin is warm. No rashes, no other new lesions, LE edema - none               Psychiatric: Pt behavior is normal without agitation   Micro: none  Cardiac tracings I have personally interpreted today:  none  Pertinent Radiological findings (summarize): none   Lab Results  Component Value Date   WBC 5.7 03/22/2022   HGB 12.7 (L) 03/22/2022   HCT 37.6 (L) 03/22/2022   PLT 479.0 (H) 03/22/2022   GLUCOSE 96 03/22/2022   CHOL 229 (H) 01/22/2022   TRIG 329.0 (H) 01/22/2022   HDL 38.20 (L) 01/22/2022   LDLDIRECT 129.0 01/22/2022   LDLCALC 150 (H) 01/01/2021   ALT 14 03/22/2022   AST 17 03/22/2022   NA 140 03/22/2022   K 4.3 03/22/2022   CL 101 03/22/2022   CREATININE 1.37 03/22/2022   BUN 14 03/22/2022   CO2 27 03/22/2022   TSH 1.99 01/22/2022   PSA 0.82 01/22/2022   INR 1.0 03/11/2022   HGBA1C 6.4 01/22/2022   MICROALBUR <0.7 01/01/2021   Assessment/Plan:  Marcus Burns is a 42 y.o. Black or African American [2] male with  has a past medical history of Acute prostatitis (06/19/2007), ALLERGIC RHINITIS (06/19/2007), ANXIETY (06/19/2007), ELEVATED BLOOD PRESSURE WITHOUT DIAGNOSIS OF HYPERTENSION (06/19/2007), GERD (06/19/2007), GERD (gastroesophageal reflux disease), History of COVID-19 (01/24/2019), History of kidney stones, Inguinal hernia, Migraines,  and NEPHROLITHIASIS, HX OF (06/19/2007).  Right renal mass Will need surgical removal soon pending heme recommendation on timing with respect to anticoagulation  ELEVATED BLOOD PRESSURE WITHOUT DIAGNOSIS OF HYPERTENSION Severe up to sbp 200 per pt while hospd with acute illness likely reactive, now improved with much lower BP last 2 days at home, ok to continue to monitor at home, hold further amlodipine for now  Acute pulmonary embolism (HCC) Improved symptoms, now on eliquis anticoagulation  CAP (community acquired pneumonia) Unclear if present to start, but clinically stable today, hold further imaging or antibx for now  Anemia New onset mild, ? Dilutional related to IVF, for f/u iron with labs  Lab Results  Component Value Date   WBC 5.7 03/22/2022   HGB 12.7 (L) 03/22/2022   HCT 37.6 (L) 03/22/2022   MCV 85.1 03/22/2022   PLT 479.0 (H) 03/22/2022   Followup: Return in about 6 months (around 09/22/2022).  Cathlean Cower, MD 03/22/2022 3:37 PM [facility]

## 2022-03-22 NOTE — Patient Instructions (Signed)
Please continue to monitor the BP and call in 1-2 wk (or mychart message) if the top number (SBP) still is in the 130's and 140's, to consider start amlodipine 5 mg per day  Please continue all other medications as before, and refills have been done if requested.  Please have the pharmacy call with any other refills you may need.  Please continue your efforts at being more active, low cholesterol diet, and weight control  Please keep your appointments with your specialists as you may have planned - hematology Mar 22  Please go to the LAB at the blood drawing area for the tests to be done  You will be contacted by phone if any changes need to be made immediately.  Otherwise, you will receive a letter about your results with an explanation, but please check with MyChart first.  Please remember to sign up for MyChart if you have not done so, as this will be important to you in the future with finding out test results, communicating by private email, and scheduling acute appointments online when needed.  Please make an Appointment to return in 6 months, or sooner if needed

## 2022-03-22 NOTE — Assessment & Plan Note (Signed)
Unclear if present to start, but clinically stable today, hold further imaging or antibx for now

## 2022-03-22 NOTE — Assessment & Plan Note (Signed)
New onset mild, ? Dilutional related to IVF, for f/u iron with labs  Lab Results  Component Value Date   WBC 5.7 03/22/2022   HGB 12.7 (L) 03/22/2022   HCT 37.6 (L) 03/22/2022   MCV 85.1 03/22/2022   PLT 479.0 (H) 03/22/2022

## 2022-03-22 NOTE — Assessment & Plan Note (Signed)
Will need surgical removal soon pending heme recommendation on timing with respect to anticoagulation

## 2022-03-23 NOTE — Telephone Encounter (Signed)
Forms received,completion in progress 

## 2022-03-24 ENCOUNTER — Inpatient Hospital Stay: Admit: 2022-03-24 | Payer: 59 | Admitting: Urology

## 2022-03-24 SURGERY — NEPHRECTOMY, RADICAL, ROBOT-ASSISTED, LAPAROSCOPIC, ADULT
Anesthesia: General | Laterality: Right

## 2022-03-26 ENCOUNTER — Inpatient Hospital Stay: Payer: 59

## 2022-03-26 ENCOUNTER — Other Ambulatory Visit: Payer: Self-pay

## 2022-03-26 ENCOUNTER — Telehealth: Payer: Self-pay

## 2022-03-26 ENCOUNTER — Inpatient Hospital Stay: Payer: 59 | Attending: Hematology and Oncology | Admitting: Hematology and Oncology

## 2022-03-26 VITALS — BP 141/80 | HR 104 | Temp 98.2°F | Resp 16 | Ht 76.0 in | Wt 307.9 lb

## 2022-03-26 DIAGNOSIS — I2693 Single subsegmental pulmonary embolism without acute cor pulmonale: Secondary | ICD-10-CM | POA: Insufficient documentation

## 2022-03-26 DIAGNOSIS — N2889 Other specified disorders of kidney and ureter: Secondary | ICD-10-CM

## 2022-03-26 DIAGNOSIS — Z7901 Long term (current) use of anticoagulants: Secondary | ICD-10-CM | POA: Insufficient documentation

## 2022-03-26 DIAGNOSIS — I2699 Other pulmonary embolism without acute cor pulmonale: Secondary | ICD-10-CM

## 2022-03-26 NOTE — Progress Notes (Signed)
[name]  Patient Care Team: Biagio Borg, MD as PCP - General  CHIEF COMPLAINTS/PURPOSE OF CONSULTATION:  PE  ASSESSMENT & PLAN:   This is a very pleasant 42 year old male patient with past medical history significant for renal mass concerning for renal cell carcinoma now diagnosed with a right-sided PE and on anticoagulation referred to hematology for additional recommendations.  He apparently was being planned for right nephrectomy however ended up having severe shortness of breath and was diagnosed with right-sided pulmonary embolism and right heart strain.  He was started on anticoagulation.  Surgery had to be delayed given the new onset PE.  He is tolerating anticoagulation well, has noticed some improvement in shortness of breath and chest pain.  He had no prior history of DVT/PE.  No family history of hypercoagulable disorders.  No known provoking factors for this besides the renal mass in date.    We have clearly discussed that although it would have been ideal to proceed with nephrectomy as soon as possible, in the setting of acute PE, would not recommend elective surgeries for at least 3 months.  If the surgery is indeed deemed emergent, he may have to assume risks and proceed with surgery.  He however would be at very high risk of cardiovascular complications including death in the postop setting.   He understands that he may have to take blood thinners for at least 3 months before reconsidering the surgery.  I have also ordered CT PE protocol to be done in about 3 months.  He will return to clinic in first week of June after his CT. He understands that the renal mass may grow in the interim and in the case scenario could even metastasize.  And I agree with follow-up imaging per Dr. Hubbard Robinson.  I do not have any specific recommendations on the frequency of imaging. All his questions were answered to the best my knowledge.  Will consider hypercoagulable workup  after completing anticoagulation.  Thank you for consulting [place] in the care of this patient.  Please do not hesitate to contact us with any additional questions or concerns.  HISTORY OF PRESENTING ILLNESS:  Marcus Burns 42 y.o. male is here because of PE and renal mass.  This is a very pleasant 42 yr old male patient with no significant PMH of nephrolithiasis, now diagnosed with a large PE referred to hematology for recommendations since he also happens to be diagnosed with right renal mass.  Ms. Laruth Bouchard is here for an initial visit with his wife.  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  He denies any major health issues.  No personal history of DVT or PE.  No known family history of hypercoagulable disorders.  He denies any major trauma, sedentary habitus, testosterone supplementation or recent travel.  He started noticing shortness of breath the day of ER visit and went for further evaluation.  He had CT chest PE protocol which showed extensive right-sided pulmonary emboli with evidence of right heart strain, pleural-based somewhat rounded areas of increased airspace opacity in the right lower lobe likely representing early changes of pulmonary infarct, changes consistent with known history of right renal cell carcinoma.  He is now on Eliquis, tolerating it well.  He has noticed some improvement in shortness of breath and chest pain.  Rest of the pertinent 10 point ROS reviewed and negative   MEDICAL HISTORY:  Past Medical History:  Diagnosis Date   Acute prostatitis 06/19/2007  ALLERGIC RHINITIS 06/19/2007   ANXIETY 06/19/2007   ELEVATED BLOOD PRESSURE WITHOUT DIAGNOSIS OF HYPERTENSION 06/19/2007   GERD 06/19/2007   GERD (gastroesophageal reflux disease)    History of COVID-19 01/24/2019   History of kidney stones    Inguinal hernia    Migraines    NEPHROLITHIASIS, HX OF 06/19/2007    SURGICAL HISTORY: Past Surgical History:  Procedure Laterality Date   CYSTOSCOPY WITH  RETROGRADE PYELOGRAM, URETEROSCOPY AND STENT PLACEMENT Bilateral 02/09/2019   Procedure: CYSTOSCOPY WITH RETROGRADE PYELOGRAM, DIAGNOSTIC URETEROSCOPY AND STENT PLACEMENT;  Surgeon: Alexis Frock, MD;  Location: [facility];  Service: Urology;  Laterality: Bilateral;  40 MINS   CYSTOSCOPY WITH RETROGRADE PYELOGRAM, URETEROSCOPY AND STENT PLACEMENT Bilateral 02/23/2019   Procedure: CYSTOSCOPY WITH RETROGRADE PYELOGRAM, URETEROSCOPY AND STENT PLACEMENT;  Surgeon: Alexis Frock, MD;  Location: [facility];  Service: Urology;  Laterality: Bilateral;  1 HR   HERNIA REPAIR     HOLMIUM LASER APPLICATION Bilateral [id number]   Procedure: HOLMIUM LASER APPLICATION;  Surgeon: Alexis Frock, MD;  Location: [facility];  Service: Urology;  Laterality: Bilateral;    SOCIAL HISTORY: Social History   Socioeconomic History   Marital status: Married    Spouse name: Not on file   Number of children: Not on file   Years of education: Not on file   Highest education level: Not on file  Occupational History   Not on file  Tobacco Use   Smoking status: Former   Smokeless tobacco: Former  Scientific laboratory technician Use: Never used  Substance and Sexual Activity   Alcohol use: No   Drug use: No   Sexual activity: Not on file  Other Topics Concern   Not on file  Social History Narrative   Not on file   Social Determinants of Health   Financial Resource Strain: Not on file  Food Insecurity: No Food Insecurity (03/11/2022)   Hunger Vital Sign    Worried About Running Out of Food in the Last Year: Never true    Ran Out of Food in the Last Year: Never true  Transportation Needs: No Transportation Needs (03/11/2022)   PRAPARE - Hydrologist (Medical): No    Lack of Transportation (Non-Medical): No  Physical Activity: Not on file  Stress: Not on file  Social Connections: Not on file  Intimate Partner Violence: Not At Risk (03/11/2022)   Humiliation, Afraid, Rape, and Kick questionnaire    Fear  of Current or Ex-Partner: No    Emotionally Abused: No    Physically Abused: No    Sexually Abused: No    FAMILY HISTORY: Family History  Problem Relation Age of Onset   Coronary artery disease Father    Anxiety disorder Mother    Anxiety disorder Brother    Diabetes Brother     ALLERGIES:  is allergic to doxycycline.  MEDICATIONS:  Current Outpatient Medications  Medication Sig Dispense Refill   acetaminophen (TYLENOL) 500 MG tablet Take 1,000 mg by mouth every 6 (six) hours as needed (pain.).     albuterol (VENTOLIN HFA) 108 (90 Base) MCG/ACT inhaler Inhale 2 puffs into the lungs every 6 (six) hours as needed for wheezing or shortness of breath. 8 g 1   amoxicillin-clavulanate (AUGMENTIN) 875-125 MG tablet Take 1 tablet by mouth 2 (two) times daily. 6 tablet 0   [START ON 04/12/2022] apixaban (ELIQUIS) 5 MG TABS tablet Take 1 tablet (5 mg total) by mouth 2 (two) times daily. 60 tablet  1   APIXABAN (ELIQUIS) VTE STARTER PACK (10MG  AND 5MG ) Take as directed on package: start with two-5mg  tablets twice daily for 7 days. On day 8, switch to one-5mg  tablet twice daily. 74 each 0   APPLE CIDER VINEGAR PO Take 1 capsule by mouth daily.     cetirizine (ZYRTEC) 10 MG tablet Take 10 mg by mouth daily as needed for allergies.     Cholecalciferol (VITAMIN D) 50 MCG (2000 UT) tablet Take 2,000 Units by mouth daily. (Patient not taking: Reported on 03/22/2022)     clonazePAM (KLONOPIN) 0.5 MG tablet Take 1 tablet (0.5 mg total) by mouth 2 (two) times daily as needed for anxiety. 60 tablet 1   gabapentin (NEURONTIN) 300 MG capsule Take 1 capsule (300 mg total) by mouth 3 (three) times daily. (Patient taking differently: Take 100 mg by mouth as needed (pain).) 90 capsule 3   ibuprofen (ADVIL) 200 MG tablet Take 600 mg by mouth every 8 (eight) hours as needed (for pain.). (Patient not taking: Reported on 03/22/2022)     iron polysaccharides (NU-IRON) 150 MG capsule Take 1 capsule (150 mg total) by mouth  daily. 90 capsule 1   Multiple Vitamin (MULTIVITAMIN WITH MINERALS) TABS tablet Take 1 tablet by mouth at bedtime. (Patient not taking: Reported on 03/22/2022)     oxyCODONE (OXY IR/ROXICODONE) 5 MG immediate release tablet Take 1 tablet (5 mg total) by mouth every 6 (six) hours as needed for moderate pain or breakthrough pain. 20 tablet 0   No current facility-administered medications for this visit.     PHYSICAL EXAMINATION: ECOG PERFORMANCE STATUS: 0 - Asymptomatic  Vitals:   03/26/22 1003  BP: (!) 141/80  Pulse: (!) 104  Resp: 16  Temp: 98.2 F (36.8 C)  SpO2: 98%   Filed Weights   03/26/22 1003  Weight: (!) 307 lb 14.4 oz (139.7 kg)    GENERAL:alert, no distress and comfortable SKIN: skin color, texture, turgor are normal, no rashes or significant lesions EYES: normal, conjunctiva are pink and non-injected, sclera clear OROPHARYNX:no exudate, no erythema and lips, buccal mucosa, and tongue normal  NECK: supple, thyroid normal size, non-tender, without nodularity LYMPH:  no palpable lymphadenopathy in the cervical, axillary LUNGS: clear to auscultation and percussion with normal breathing effort HEART: regular rate & rhythm and no murmurs and no lower extremity edema ABDOMEN:abdomen soft, non-tender and normal bowel sounds Musculoskeletal:no cyanosis of digits and no clubbing  PSYCH: alert & oriented x 3 with fluent speech NEURO: no focal motor/sensory deficits  LABORATORY DATA:  I have reviewed the data as listed Lab Results  Component Value Date   WBC 5.7 03/22/2022   HGB 12.7 (L) 03/22/2022   HCT 37.6 (L) 03/22/2022   MCV 85.1 03/22/2022   PLT 479.0 (H) 03/22/2022     Chemistry      Component Value Date/Time   NA 140 03/22/2022 0952   K 4.3 03/22/2022 0952   CL 101 03/22/2022 0952   CO2 27 03/22/2022 0952   BUN 14 03/22/2022 0952   CREATININE 1.37 03/22/2022 0952      Component Value Date/Time   CALCIUM 10.2 03/22/2022 0952   ALKPHOS 71 03/22/2022  0952   AST 17 03/22/2022 0952   ALT 14 03/22/2022 0952   BILITOT 0.5 03/22/2022 0952       RADIOGRAPHIC STUDIES: I have personally reviewed the radiological images as listed and agreed with the findings in the report. VAS [place] LOWER EXTREMITY VENOUS (DVT)  Result Date:  03/11/2022  Lower Venous DVT Study Patient Name:  ZIGGY DICAPUA  Date of Exam:   03/11/2022 Medical Rec #: [id number]          Accession #:    [id number] Date of Birth: 1980-05-14          Patient Gender: M Patient Age:   89 years Exam Location:  [facility] Procedure:      VAS [place] LOWER EXTREMITY VENOUS (DVT) Referring Phys: DAVID ORTIZ --------------------------------------------------------------------------------  Indications: Pulmonary embolism.  Comparison Study: No previous exams Performing Technologist: Jody Hill RVT, RDMS  Examination Guidelines: A complete evaluation includes B-mode imaging, spectral Doppler, color Doppler, and power Doppler as needed of all accessible portions of each vessel. Bilateral testing is considered an integral part of a complete examination. Limited examinations for reoccurring indications may be performed as noted. The reflux portion of the exam is performed with the patient in reverse Trendelenburg.  +---------+---------------+---------+-----------+----------+--------------+ RIGHT    CompressibilityPhasicitySpontaneityPropertiesThrombus Aging +---------+---------------+---------+-----------+----------+--------------+ CFV      Full           Yes      Yes                                 +---------+---------------+---------+-----------+----------+--------------+ SFJ      Full                                                        +---------+---------------+---------+-----------+----------+--------------+ FV Prox  Full           Yes      Yes                                 +---------+---------------+---------+-----------+----------+--------------+ FV Mid   Full            Yes      Yes                                 +---------+---------------+---------+-----------+----------+--------------+ FV DistalFull           Yes      Yes                                 +---------+---------------+---------+-----------+----------+--------------+ PFV      Full                                                        +---------+---------------+---------+-----------+----------+--------------+ POP      Full           Yes      Yes                                 +---------+---------------+---------+-----------+----------+--------------+ PTV      Full                                                        +---------+---------------+---------+-----------+----------+--------------+  PERO     Full                                                        +---------+---------------+---------+-----------+----------+--------------+   +---------+---------------+---------+-----------+----------+--------------+ LEFT     CompressibilityPhasicitySpontaneityPropertiesThrombus Aging +---------+---------------+---------+-----------+----------+--------------+ CFV      Full           Yes      Yes                                 +---------+---------------+---------+-----------+----------+--------------+ SFJ      Full                                                        +---------+---------------+---------+-----------+----------+--------------+ FV Prox  Full           Yes      Yes                                 +---------+---------------+---------+-----------+----------+--------------+ FV Mid   Full           Yes      Yes                                 +---------+---------------+---------+-----------+----------+--------------+ FV DistalFull           No       Yes                                 +---------+---------------+---------+-----------+----------+--------------+ PFV      Full                                                         +---------+---------------+---------+-----------+----------+--------------+ POP      Full           Yes      Yes                                 +---------+---------------+---------+-----------+----------+--------------+ PTV      Full                                                        +---------+---------------+---------+-----------+----------+--------------+ PERO     Full                                                        +---------+---------------+---------+-----------+----------+--------------+  Summary: BILATERAL: - No evidence of deep vein thrombosis seen in the lower extremities, bilaterally. -No evidence of popliteal cyst, bilaterally.   *See table(s) above for measurements and observations. Electronically signed by Orlie Pollen on 03/11/2022 at 4:18:57 PM.    Final    ECHOCARDIOGRAM COMPLETE  Result Date: 03/11/2022    ECHOCARDIOGRAM REPORT   Patient Name:   RYLEY BAYERL Date of Exam: 03/11/2022 Medical Rec #:  [id number]         Height:       76.0 in Accession #:    [id number]        Weight:       315.9 lb Date of Birth:  1980/07/16         BSA:          2.690 m Patient Age:    52 years          BP:           149/101 mmHg Patient Gender: M                 HR:           90 bpm. Exam Location:  Inpatient Procedure: 2D Echo and Intracardiac Opacification Agent Indications:    pulmonary embolus  History:        Patient has no prior history of Echocardiogram examinations.  Sonographer:    Harvie Junior Referring Phys: [id number] Rhetta Mura  Sonographer Comments: Technically difficult study due to poor echo windows, patient is obese and no subcostal window. Image acquisition challenging due to patient body habitus, Image acquisition challenging due to respiratory motion and lung artifact. IMPRESSIONS  1. Left ventricular ejection fraction, by estimation, is 60 to 65%. The left ventricle has normal function. The left ventricle has no regional wall motion abnormalities. Left  ventricular diastolic parameters were normal.  2. Right ventricular systolic function is normal. The right ventricular size is normal. There is normal pulmonary artery systolic pressure.  3. The mitral valve is normal in structure. No evidence of mitral valve regurgitation. No evidence of mitral stenosis.  4. The aortic valve is normal in structure. Aortic valve regurgitation is not visualized. No aortic stenosis is present.  5. The inferior vena cava is normal in size with greater than 50% respiratory variability, suggesting right atrial pressure of 3 mmHg. FINDINGS  Left Ventricle: Left ventricular ejection fraction, by estimation, is 60 to 65%. The left ventricle has normal function. The left ventricle has no regional wall motion abnormalities. Definity contrast agent was given IV to delineate the left ventricular  endocardial borders. The left ventricular internal cavity size was normal in size. There is no left ventricular hypertrophy. Left ventricular diastolic parameters were normal. Right Ventricle: The right ventricular size is normal. No increase in right ventricular wall thickness. Right ventricular systolic function is normal. There is normal pulmonary artery systolic pressure. The tricuspid regurgitant velocity is 1.20 m/s, and  with an assumed right atrial pressure of 3 mmHg, the estimated right ventricular systolic pressure is 8.8 mmHg. Left Atrium: Left atrial size was normal in size. Right Atrium: Right atrial size was normal in size. Pericardium: There is no evidence of pericardial effusion. Mitral Valve: The mitral valve is normal in structure. No evidence of mitral valve regurgitation. No evidence of mitral valve stenosis. Tricuspid Valve: The tricuspid valve is normal in structure. Tricuspid valve regurgitation is not demonstrated. No evidence of tricuspid stenosis. Aortic Valve: The aortic valve is normal in structure. Aortic  valve regurgitation is not visualized. No aortic stenosis is present.  Aortic valve mean gradient measures 4.0 mmHg. Aortic valve peak gradient measures 7.3 mmHg. Aortic valve area, by VTI measures 3.38 cm. Pulmonic Valve: The pulmonic valve was normal in structure. Pulmonic valve regurgitation is not visualized. No evidence of pulmonic stenosis. Aorta: The aortic root is normal in size and structure. Venous: The inferior vena cava is normal in size with greater than 50% respiratory variability, suggesting right atrial pressure of 3 mmHg. IAS/Shunts: No atrial level shunt detected by color flow Doppler.  LEFT VENTRICLE PLAX 2D LVIDd:         4.50 cm   Diastology LVIDs:         2.80 cm   LV e' medial:    10.00 cm/s LV PW:         1.00 cm   LV E/e' medial:  6.1 LV IVS:        1.00 cm   LV e' lateral:   16.10 cm/s LVOT diam:     2.20 cm   LV E/e' lateral: 3.8 LV SV:         77 LV SV Index:   29 LVOT Area:     3.80 cm  RIGHT VENTRICLE RV S prime:     15.30 cm/s LEFT ATRIUM           Index        RIGHT ATRIUM           Index LA diam:      3.60 cm 1.34 cm/m   RA Area:     13.90 cm LA Vol (A4C): 68.1 ml 25.31 ml/m  RA Volume:   34.80 ml  12.94 ml/m  AORTIC VALVE                    PULMONIC VALVE AV Area (Vmax):    3.55 cm     PV Vmax:       1.47 m/s AV Area (Vmean):   3.39 cm     PV Peak grad:  8.6 mmHg AV Area (VTI):     3.38 cm AV Vmax:           135.00 cm/s AV Vmean:          89.900 cm/s AV VTI:            0.227 m AV Peak Grad:      7.3 mmHg AV Mean Grad:      4.0 mmHg LVOT Vmax:         126.00 cm/s LVOT Vmean:        80.100 cm/s LVOT VTI:          0.202 m LVOT/AV VTI ratio: 0.89  AORTA Ao Root diam: 3.60 cm Ao Asc diam:  3.60 cm MITRAL VALVE               TRICUSPID VALVE MV Area (PHT): 4.06 cm    TR Peak grad:   5.8 mmHg MV Decel Time: 187 msec    TR Vmax:        120.00 cm/s MV E velocity: 60.70 cm/s MV A velocity: 69.60 cm/s  SHUNTS MV E/A ratio:  0.87        Systemic VTI:  0.20 m                            Systemic Diam: 2.20 cm Sanda Klein MD Electronically signed  by Sanda Klein MD Signature Date/Time: 03/11/2022/1:28:22 PM    Final    CT Angio Chest PE W/Cm &/Or Wo Cm  Result Date: 03/11/2022 CLINICAL DATA:  Shortness of breath for several days on the right, history of recent right renal cell carcinoma diagnosis EXAM: CT ANGIOGRAPHY CHEST WITH CONTRAST TECHNIQUE: Multidetector CT imaging of the chest was performed using the standard protocol during bolus administration of intravenous contrast. Multiplanar CT image reconstructions and MIPs were obtained to evaluate the vascular anatomy. RADIATION DOSE REDUCTION: This exam was performed according to the departmental dose-optimization program which includes automated exposure control, adjustment of the mA and/or kV according to patient size and/or use of iterative reconstruction technique. CONTRAST:  147mL OMNIPAQUE IOHEXOL 350 MG/ML SOLN COMPARISON:  02/26/2022 FINDINGS: Cardiovascular: Thoracic aorta shows no aneurysmal dilatation or dissection. Pulmonary artery demonstrates a normal branching pattern bilaterally. Large central right pulmonary embolus is noted with extension into the segmental and subsegmental branches primarily in the lower lobe. RV/LV ratio is greater than 1 consistent with right heart strain. Heart is enlarged in size. No coronary calcifications are noted. Mediastinum/Nodes: Thoracic inlet is within normal limits. No hilar or mediastinal adenopathy is noted. The esophagus as visualized is within normal limits. Lungs/Pleura: Left lung is well aerated and clear. Right lung demonstrates several pleural based rounded areas of increased airspace opacity in the lower lobe likely representing early pulmonary infarcts. No sizable parenchymal nodule is noted. No effusion is seen. Upper Abdomen: There is again visualized in the upper pole mass lesion in the right kidney incompletely evaluated on this exam but stable from the prior study. No other focal abnormality in the upper abdomen is noted. Musculoskeletal:  Degenerative changes of the thoracic spine are noted. No acute rib abnormality is noted. Review of the MIP images confirms the above findings. IMPRESSION: Extensive right-sided pulmonary emboli with evidence of right heart strain. Pleural based somewhat rounded areas of increased airspace opacity in the right lower lobe likely representing early changes of pulmonary infarct. Changes consistent with the known history of right renal cell carcinoma. Critical Value/emergent results were called by telephone at the time of interpretation on 03/11/2022 at 2:47 am to DR [facility], who verbally acknowledged these results. Electronically Signed   By: Inez Catalina M.D.   On: 03/11/2022 02:49   DG Chest 2 View  Result Date: 03/10/2022 CLINICAL DATA:  Right lateral pleuritic chest pain. Cough. Former smoker. EXAM: CHEST - 2 VIEW COMPARISON:  None Available. FINDINGS: An ill-defined rounded density is identified in the periphery of the right lung base on the frontal view. No other pulmonary nodule, mass, or infiltrate. The cardiomediastinal silhouette is normal. No pneumothorax. IMPRESSION: 1. An ill-defined rounded density is projected in the periphery of the right lung base. This could represent an infectious or inflammatory process. A pulmonary nodule is not excluded on this single study. Recommend a short-term follow-up x-ray in 2 or 3 weeks to ensure resolution of the finding. If the finding does not resolve, recommend a CT scan. Electronically Signed   By: Dorise Bullion III M.D.   On: 03/10/2022 11:34    All questions were answered. The patient knows to call the clinic with any problems, questions or concerns. I spent 45 minutes in the care of this patient including H and P, review of records, counseling and coordination of care.     Benay Pike, MD 03/26/2022 10:06 AM

## 2022-03-26 NOTE — Telephone Encounter (Signed)
Pt picked up copies of paper work and then staff faxed to respective FMLA contact.  Fax confirmation sent to pt via MyChart.

## 2022-03-29 ENCOUNTER — Encounter: Payer: Self-pay | Admitting: Hematology and Oncology

## 2022-03-29 ENCOUNTER — Encounter: Payer: Self-pay | Admitting: Internal Medicine

## 2022-03-30 NOTE — Telephone Encounter (Signed)
Patient dropped off document FMLA, to be filled out by provider. Patient requested to send it via Fax within 2-days. Document is located in providers tray at front office.Please advise at [facility] [phone]

## 2022-04-05 ENCOUNTER — Other Ambulatory Visit: Payer: Self-pay | Admitting: *Deleted

## 2022-04-05 MED ORDER — OXYCODONE HCL 5 MG PO TABS: 5.0000 mg | ORAL_TABLET | Freq: Four times a day (QID) | ORAL | 0 refills | Status: DC | PRN

## 2022-04-05 NOTE — Telephone Encounter (Signed)
PT calls back today following up on the FMLA paperwork that was mentioned in the past note. I let him know that Rodman Pickle had been working on it but that she had been out and I wasn't sure if these forms had been completed since last week.   CB for PT: [phone]

## 2022-04-06 ENCOUNTER — Telehealth: Payer: Self-pay | Admitting: Internal Medicine

## 2022-04-06 NOTE — Telephone Encounter (Signed)
PT also spoke with me today expressing urgency for this form to be completed. I did express to him that [employer], which who handling Dr.John's paperwork, had been out and would be spending today catching up on all forms. PT is just wanting to know an ETA for completion on these forms to then inform his wife's job about.  CB: [phone]

## 2022-04-06 NOTE — Telephone Encounter (Signed)
Patient called back and states that he needs the fmla paperwork today.  Please call patient

## 2022-04-15 NOTE — Telephone Encounter (Signed)
F/u on email.Marland KitchenRaechel Chute

## 2022-04-19 MED ORDER — AMLODIPINE BESYLATE 5 MG PO TABS: 5.0000 mg | ORAL_TABLET | Freq: Every day | ORAL | 3 refills | Status: DC

## 2022-04-19 NOTE — Addendum Note (Signed)
Addended by: Corwin Levins on: 04/19/2022 12:53 PM   Modules accepted: Orders

## 2022-05-04 ENCOUNTER — Telehealth: Payer: Self-pay | Admitting: *Deleted

## 2022-05-04 NOTE — Telephone Encounter (Addendum)
-----   Message from Rachel Moulds, MD sent at 05/03/2022  3:08 PM EDT ----- Val,  Can we get this CT PE protocol scheduled in first 2 weeks of May?  Thanks,  This RN contacted [employer] and obtained an appointment for Monday May 6 at 7am at [facility].  Informed pt who is agreeable to time and location.  Above note will be sent to MD for review and inquire if pt needs telephone visit for follow up.

## 2022-05-05 MED ORDER — TELMISARTAN 20 MG PO TABS: 20.0000 mg | ORAL_TABLET | Freq: Every day | ORAL | 1 refills | Status: DC

## 2022-05-05 NOTE — Addendum Note (Signed)
Addended by: Pincus Sanes on: 05/05/2022 09:15 PM   Modules accepted: Orders

## 2022-05-10 ENCOUNTER — Telehealth: Payer: Self-pay | Admitting: *Deleted

## 2022-05-10 ENCOUNTER — Ambulatory Visit (HOSPITAL_BASED_OUTPATIENT_CLINIC_OR_DEPARTMENT_OTHER)
Admission: RE | Admit: 2022-05-10 | Discharge: 2022-05-10 | Disposition: A | Discharge status: Home / Self Care | Payer: 59 | Source: Ambulatory Visit | Attending: Hematology and Oncology | Admitting: Hematology and Oncology

## 2022-05-10 DIAGNOSIS — I2699 Other pulmonary embolism without acute cor pulmonale: Secondary | ICD-10-CM

## 2022-05-10 LAB — POCT I-STAT CREATININE: Creatinine, Ser: 1.6 mg/dL — ABNORMAL HIGH (ref 0.61–1.24)

## 2022-05-10 MED ORDER — IOHEXOL 350 MG/ML SOLN
100.0000 mL | Freq: Once | INTRAVENOUS | Status: AC | PRN
  Administered 2022-05-10: 80 mL via INTRAVENOUS

## 2022-05-10 NOTE — Telephone Encounter (Signed)
Per Dr.Iruku, called to f/u with pt about recent creatinine labs. Advised pt to increase water intake. Pt verbalized understanding.

## 2022-05-11 ENCOUNTER — Encounter: Payer: Self-pay | Admitting: Hematology and Oncology

## 2022-05-11 NOTE — Progress Notes (Unsigned)
    Subjective:    Patient ID: Marcus Burns, male    DOB: 1980/05/01, 42 y.o.   MRN: [id number]      HPI Marcus Burns is here for No chief complaint on file.        Medications and allergies reviewed with patient and updated if appropriate.  Current Outpatient Medications on File Prior to Visit  Medication Sig Dispense Refill   acetaminophen (TYLENOL) 500 MG tablet Take 1,000 mg by mouth every 6 (six) hours as needed (pain.).     albuterol (VENTOLIN HFA) 108 (90 Base) MCG/ACT inhaler Inhale 2 puffs into the lungs every 6 (six) hours as needed for wheezing or shortness of breath. 8 g 1   amoxicillin-clavulanate (AUGMENTIN) 875-125 MG tablet Take 1 tablet by mouth 2 (two) times daily. 6 tablet 0   apixaban (ELIQUIS) 5 MG TABS tablet Take 1 tablet (5 mg total) by mouth 2 (two) times daily. 60 tablet 1   APIXABAN (ELIQUIS) VTE STARTER PACK (10MG  AND 5MG ) Take as directed on package: start with two-5mg  tablets twice daily for 7 days. On day 8, switch to one-5mg  tablet twice daily. 74 each 0   APPLE CIDER VINEGAR PO Take 1 capsule by mouth daily.     cetirizine (ZYRTEC) 10 MG tablet Take 10 mg by mouth daily as needed for allergies.     Cholecalciferol (VITAMIN D) 50 MCG (2000 UT) tablet Take 2,000 Units by mouth daily. (Patient not taking: Reported on 03/22/2022)     clonazePAM (KLONOPIN) 0.5 MG tablet Take 1 tablet (0.5 mg total) by mouth 2 (two) times daily as needed for anxiety. 60 tablet 1   gabapentin (NEURONTIN) 300 MG capsule Take 1 capsule (300 mg total) by mouth 3 (three) times daily. (Patient taking differently: Take 100 mg by mouth as needed (pain).) 90 capsule 3   ibuprofen (ADVIL) 200 MG tablet Take 600 mg by mouth every 8 (eight) hours as needed (for pain.). (Patient not taking: Reported on 03/22/2022)     iron polysaccharides (NU-IRON) 150 MG capsule Take 1 capsule (150 mg total) by mouth daily. 90 capsule 1   Multiple Vitamin (MULTIVITAMIN WITH MINERALS) TABS tablet Take 1  tablet by mouth at bedtime. (Patient not taking: Reported on 03/22/2022)     oxyCODONE (OXY IR/ROXICODONE) 5 MG immediate release tablet Take 1 tablet (5 mg total) by mouth every 6 (six) hours as needed for moderate pain or breakthrough pain. 20 tablet 0   telmisartan (MICARDIS) 20 MG tablet Take 1 tablet (20 mg total) by mouth daily. 30 tablet 1   [DISCONTINUED] triamcinolone (NASACORT) 55 MCG/ACT AERO nasal inhaler Place 2 sprays into the nose daily. (Patient not taking: Reported on 02/01/2019) 1 Inhaler 12   No current facility-administered medications on file prior to visit.    Review of Systems     Objective:  There were no vitals filed for this visit. BP Readings from Last 3 Encounters:  03/26/22 (!) 141/80  03/22/22 138/86  03/14/22 125/87   Wt Readings from Last 3 Encounters:  03/26/22 (!) 307 lb 14.4 oz (139.7 kg)  03/22/22 (!) 308 lb (139.7 kg)  03/13/22 299 lb 11.2 oz (135.9 kg)   There is no height or weight on file to calculate BMI.    Physical Exam         Assessment & Plan:    See Problem List for Assessment and Plan of chronic medical problems.

## 2022-05-12 ENCOUNTER — Ambulatory Visit (INDEPENDENT_AMBULATORY_CARE_PROVIDER_SITE_OTHER): Payer: 59 | Admitting: Internal Medicine

## 2022-05-12 ENCOUNTER — Encounter: Payer: Self-pay | Admitting: Internal Medicine

## 2022-05-12 VITALS — BP 128/84 | HR 85 | Temp 97.9°F | Ht 76.0 in | Wt 320.0 lb

## 2022-05-12 DIAGNOSIS — I10 Essential (primary) hypertension: Secondary | ICD-10-CM | POA: Diagnosis not present

## 2022-05-12 MED ORDER — METOPROLOL SUCCINATE ER 25 MG PO TB24: 25.0000 mg | ORAL_TABLET | Freq: Every day | ORAL | 3 refills | Status: DC

## 2022-05-12 NOTE — Patient Instructions (Addendum)
       BP goal < 130/80     Medications changes include :   metoprolol xl 25 mg daily,  continue your telmisartan.

## 2022-05-12 NOTE — Telephone Encounter (Signed)
Needs ROV please 

## 2022-05-14 ENCOUNTER — Other Ambulatory Visit: Payer: Self-pay | Admitting: Urology

## 2022-05-26 ENCOUNTER — Encounter: Payer: Self-pay | Admitting: *Deleted

## 2022-06-02 ENCOUNTER — Encounter (HOSPITAL_COMMUNITY): Payer: Self-pay

## 2022-06-04 NOTE — Patient Instructions (Signed)
SURGICAL WAITING ROOM VISITATION  Patients having surgery or a procedure may have no more than 2 support people in the waiting area - these visitors may rotate.    Children under the age of 47 must have an adult with them who is not the patient.  Due to an increase in RSV and influenza rates and associated hospitalizations, children ages 73 and under may not visit patients in [facility] hospitals.  If the patient needs to stay at the hospital during part of their recovery, the visitor guidelines for inpatient rooms apply. Pre-op nurse will coordinate an appropriate time for 1 support person to accompany patient in pre-op.  This support person may not rotate.    Please refer to the [facility] website for the visitor guidelines for Inpatients (after your surgery is over and you are in a regular room).    Your procedure is scheduled on: 06/18/22   Report to [facility] Main Entrance    Report to admitting at 8:45 AM   Call this number if you have problems the morning of surgery [phone]   Follow a clear liquid diet the day before surgery.  Water Non-Citrus Juices (without pulp, NO RED-Apple, White grape, White cranberry) Black Coffee (NO MILK/CREAM OR CREAMERS, sugar ok)  Clear Tea (NO MILK/CREAM OR CREAMERS, sugar ok) regular and decaf                             Plain Jell-O (NO RED)                                           Fruit ices (not with fruit pulp, NO RED)                                     Popsicles (NO RED)                                                               Sports drinks like Gatorade (NO RED)  Nothing to drink after midnight.                      If you have questions, please contact your surgeon's office.   FOLLOW BOWEL PREP AND ANY ADDITIONAL PRE OP INSTRUCTIONS YOU RECEIVED FROM YOUR SURGEON'S OFFICE!!!     Oral Hygiene is also important to reduce your risk of infection.                                    Remember - BRUSH YOUR TEETH THE  MORNING OF SURGERY WITH YOUR REGULAR TOOTHPASTE  DENTURES WILL BE REMOVED PRIOR TO SURGERY PLEASE DO NOT APPLY "Poly grip" OR ADHESIVES!!!   Take these medicines the morning of surgery with A SIP OF WATER: Tylenol, Albuterol, Zyrtec, Clonazepam, Metoprolol  These are anesthesia recommendations for holding your anticoagulants.  Please contact your prescribing physician to confirm IF it is safe to hold your anticoagulants for this length of  time.   Eliquis Apixaban   72 hours   Xarelto Rivaroxaban   72 hours  Plavix Clopidogrel   120 hours  Pletal Cilostazol   120 hours                                You may not have any metal on your body including jewelry, and body piercing             Do not wear lotions, powders, cologne, or deodorant              Men may shave face and neck.   Do not bring valuables to the hospital. [facility] IS NOT             RESPONSIBLE   FOR VALUABLES.   Contacts, glasses, dentures or bridgework may not be worn into surgery.   Bring small overnight bag day of surgery.   DO NOT BRING YOUR HOME MEDICATIONS TO THE HOSPITAL. PHARMACY WILL DISPENSE MEDICATIONS LISTED ON YOUR MEDICATION LIST TO YOU DURING YOUR ADMISSION IN THE HOSPITAL!              Please read over the following fact sheets you were given: IF YOU HAVE QUESTIONS ABOUT YOUR PRE-OP INSTRUCTIONS PLEASE CALL [phone]   If you received a COVID test during your pre-op visit  it is requested that you wear a mask when out in public, stay away from anyone that may not be feeling well and notify your surgeon if you develop symptoms. If you test positive for Covid or have been in contact with anyone that has tested positive in the last 10 days please notify you surgeon.    [place] - Preparing for Surgery Before surgery, you can play an important role.  Because skin is not sterile, your skin needs to be as free of germs as possible.  You can reduce the number of germs on your skin by  washing with CHG (chlorahexidine gluconate) soap before surgery.  CHG is an antiseptic cleaner which kills germs and bonds with the skin to continue killing germs even after washing. Please DO NOT use if you have an allergy to CHG or antibacterial soaps.  If your skin becomes reddened/irritated stop using the CHG and inform your nurse when you arrive at Short Stay. Do not shave (including legs and underarms) for at least 48 hours prior to the first CHG shower.  You may shave your face/neck.  Please follow these instructions carefully:  1.  Shower with CHG Soap the night before surgery and the  morning of surgery.  2.  If you choose to wash your hair, wash your hair first as usual with your normal  shampoo.  3.  After you shampoo, rinse your hair and body thoroughly to remove the shampoo.                             4.  Use CHG as you would any other liquid soap.  You can apply chg directly to the skin and wash.  Gently with a scrungie or clean washcloth.  5.  Apply the CHG Soap to your body ONLY FROM THE NECK DOWN.   Do   not use on face/ open  Wound or open sores. Avoid contact with eyes, ears mouth and   genitals (private parts).                       Wash face,  Genitals (private parts) with your normal soap.             6.  Wash thoroughly, paying special attention to the area where your    surgery  will be performed.  7.  Thoroughly rinse your body with warm water from the neck down.  8.  DO NOT shower/wash with your normal soap after using and rinsing off the CHG Soap.                9.  Pat yourself dry with a clean towel.            10.  Wear clean pajamas.            11.  Place clean sheets on your bed the night of your first shower and do not  sleep with pets. Day of Surgery : Do not apply any lotions/deodorants the morning of surgery.  Please wear clean clothes to the hospital/surgery center.  FAILURE TO FOLLOW THESE INSTRUCTIONS MAY RESULT IN THE CANCELLATION  OF YOUR SURGERY  PATIENT SIGNATURE_________________________________  NURSE SIGNATURE__________________________________  ________________________________________________________________________ WHAT IS A BLOOD TRANSFUSION? Blood Transfusion Information  A transfusion is the replacement of blood or some of its parts. Blood is made up of multiple cells which provide different functions. Red blood cells carry oxygen and are used for blood loss replacement. White blood cells fight against infection. Platelets control bleeding. Plasma helps clot blood. Other blood products are available for specialized needs, such as hemophilia or other clotting disorders. BEFORE THE TRANSFUSION  Who gives blood for transfusions?  Healthy volunteers who are fully evaluated to make sure their blood is safe. This is blood bank blood. Transfusion therapy is the safest it has ever been in the practice of medicine. Before blood is taken from a donor, a complete history is taken to make sure that person has no history of diseases nor engages in risky social behavior (examples are intravenous drug use or sexual activity with multiple partners). The donor's travel history is screened to minimize risk of transmitting infections, such as malaria. The donated blood is tested for signs of infectious diseases, such as HIV and hepatitis. The blood is then tested to be sure it is compatible with you in order to minimize the chance of a transfusion reaction. If you or a relative donates blood, this is often done in anticipation of surgery and is not appropriate for emergency situations. It takes many days to process the donated blood. RISKS AND COMPLICATIONS Although transfusion therapy is very safe and saves many lives, the main dangers of transfusion include:  Getting an infectious disease. Developing a transfusion reaction. This is an allergic reaction to something in the blood you were given. Every precaution is taken to prevent  this. The decision to have a blood transfusion has been considered carefully by your caregiver before blood is given. Blood is not given unless the benefits outweigh the risks. AFTER THE TRANSFUSION Right after receiving a blood transfusion, you will usually feel much better and more energetic. This is especially true if your red blood cells have gotten low (anemic). The transfusion raises the level of the red blood cells which carry oxygen, and this usually causes an energy increase. The nurse administering the transfusion will  monitor you carefully for complications. HOME CARE INSTRUCTIONS  No special instructions are needed after a transfusion. You may find your energy is better. Speak with your caregiver about any limitations on activity for underlying diseases you may have. SEEK MEDICAL CARE IF:  Your condition is not improving after your transfusion. You develop redness or irritation at the intravenous (IV) site. SEEK IMMEDIATE MEDICAL CARE IF:  Any of the following symptoms occur over the next 12 hours: Shaking chills. You have a temperature by mouth above 102 F (38.9 C), not controlled by medicine. Chest, back, or muscle pain. People around you feel you are not acting correctly or are confused. Shortness of breath or difficulty breathing. Dizziness and fainting. You get a rash or develop hives. You have a decrease in urine output. Your urine turns a dark color or changes to pink, red, or brown. Any of the following symptoms occur over the next 10 days: You have a temperature by mouth above 102 F (38.9 C), not controlled by medicine. Shortness of breath. Weakness after normal activity. The white part of the eye turns yellow (jaundice). You have a decrease in the amount of urine or are urinating less often. Your urine turns a dark color or changes to pink, red, or brown. Document Released: 12/19/1999 Document Revised: 03/15/2011 Document Reviewed: 08/07/2007 [facility] Patient  Information 2014 [place], [place].  _______________________________________________________________________

## 2022-06-04 NOTE — Progress Notes (Signed)
COVID Vaccine Completed: yes  Date of COVID positive in last 90 days:  PCP - Oliver Barre, MD Cardiologist -  Hematologist- Audrielle Vankuren Moulds, MD  Chest x-ray - 03/10/22 Epic EKG - 03/12/22 Epic Stress Test -  ECHO - 03/11/22 Epic Cardiac Cath -  Pacemaker/ICD device last checked: Spinal Cord Stimulator:  Bowel Prep -   Sleep Study -  CPAP -   Fasting Blood Sugar -  Checks Blood Sugar _____ times a day  Last dose of GLP1 agonist-  N/A GLP1 instructions:  N/A   Last dose of SGLT-2 inhibitors-  N/A SGLT-2 instructions: N/A   Blood Thinner Instructions:  Eliquis Aspirin Instructions: Last Dose:  Activity level:  Can go up a flight of stairs and perform activities of daily living without stopping and without symptoms of chest pain or shortness of breath.  Able to exercise without symptoms  Unable to go up a flight of stairs without symptoms of     Anesthesia review: PE, HTN,  Patient denies shortness of breath, fever, cough and chest pain at PAT appointment  Patient verbalized understanding of instructions that were given to them at the PAT appointment. Patient was also instructed that they will need to review over the PAT instructions again at home before surgery.

## 2022-06-07 ENCOUNTER — Other Ambulatory Visit: Payer: Self-pay

## 2022-06-07 ENCOUNTER — Encounter (HOSPITAL_COMMUNITY)
Admission: RE | Admit: 2022-06-07 | Discharge: 2022-06-07 | Disposition: A | Discharge status: Home / Self Care | Payer: 59 | Source: Ambulatory Visit | Attending: Urology | Admitting: Urology

## 2022-06-07 ENCOUNTER — Encounter (HOSPITAL_COMMUNITY): Payer: Self-pay

## 2022-06-07 VITALS — BP 143/99 | HR 85 | Temp 99.2°F | Resp 14 | Ht 76.0 in | Wt 311.0 lb

## 2022-06-07 DIAGNOSIS — Z87891 Personal history of nicotine dependence: Secondary | ICD-10-CM | POA: Diagnosis not present

## 2022-06-07 DIAGNOSIS — I10 Essential (primary) hypertension: Secondary | ICD-10-CM | POA: Insufficient documentation

## 2022-06-07 DIAGNOSIS — Z7901 Long term (current) use of anticoagulants: Secondary | ICD-10-CM | POA: Insufficient documentation

## 2022-06-07 DIAGNOSIS — N2889 Other specified disorders of kidney and ureter: Secondary | ICD-10-CM | POA: Diagnosis not present

## 2022-06-07 DIAGNOSIS — Z01812 Encounter for preprocedural laboratory examination: Secondary | ICD-10-CM | POA: Diagnosis present

## 2022-06-07 DIAGNOSIS — Z01818 Encounter for other preprocedural examination: Secondary | ICD-10-CM

## 2022-06-07 HISTORY — DX: Malignant (primary) neoplasm, unspecified: C80.1

## 2022-06-07 HISTORY — DX: Essential (primary) hypertension: I10

## 2022-06-07 HISTORY — DX: Other pulmonary embolism without acute cor pulmonale: I26.99

## 2022-06-07 LAB — SURGICAL PCR SCREEN
MRSA, PCR: NEGATIVE
Staphylococcus aureus: NEGATIVE

## 2022-06-07 LAB — CBC
HCT: 34.8 % — ABNORMAL LOW (ref 39.0–52.0)
Hemoglobin: 10.8 g/dL — ABNORMAL LOW (ref 13.0–17.0)
MCH: 26.4 pg (ref 26.0–34.0)
MCHC: 31 g/dL (ref 30.0–36.0)
MCV: 85.1 fL (ref 80.0–100.0)
Platelets: 276 10*3/uL (ref 150–400)
RBC: 4.09 MIL/uL — ABNORMAL LOW (ref 4.22–5.81)
RDW: 15.9 % — ABNORMAL HIGH (ref 11.5–15.5)
WBC: 4.4 10*3/uL (ref 4.0–10.5)
nRBC: 0 % (ref 0.0–0.2)

## 2022-06-07 LAB — BASIC METABOLIC PANEL
Anion gap: 8 (ref 5–15)
BUN: 19 mg/dL (ref 6–20)
CO2: 25 mmol/L (ref 22–32)
Calcium: 9.7 mg/dL (ref 8.9–10.3)
Chloride: 105 mmol/L (ref 98–111)
Creatinine, Ser: 1.52 mg/dL — ABNORMAL HIGH (ref 0.61–1.24)
GFR, Estimated: 58 mL/min — ABNORMAL LOW (ref >60)
Glucose, Bld: 102 mg/dL — ABNORMAL HIGH (ref 70–99)
Potassium: 4.4 mmol/L (ref 3.5–5.1)
Sodium: 138 mmol/L (ref 135–145)

## 2022-06-07 LAB — TYPE AND SCREEN

## 2022-06-08 ENCOUNTER — Other Ambulatory Visit: Payer: Self-pay | Admitting: *Deleted

## 2022-06-08 MED ORDER — ENOXAPARIN SODIUM 120 MG/0.8ML IJ SOSY: 120.0000 mg | PREFILLED_SYRINGE | Freq: Two times a day (BID) | INTRAMUSCULAR | 0 refills | Status: DC

## 2022-06-09 NOTE — Progress Notes (Signed)
Case: [id number] Date/Time: 06/18/22 1045   Procedure: XI ROBOTIC ASSISTED LAPAROSCOPIC RADICAL NEPHRECTOMY NEPHRECTOMY (Right) - 3 HRS   Anesthesia type: General   Pre-op diagnosis: RIGHT RENAL MASS   [facility] ROOM 03 / [facility]   Surgeons: Loletta Parish., MD       DISCUSSION: Marcus Burns is a 42 year old male who presents to PAT prior to surgery listed above.  Patient is a former smoker with history of right-sided renal mass diagnosed in March of this year.  Complicated by development of PE with extensive clot burden, pulmonary infarct, and right heart strain now on chronic anticoagulation.  Patient followed up with hematology for recommendations regarding timing of surgery.  It was recommended that he waited at least 3 months continuously being on blood thinners prior to undergoing nephrectomy due to being high risk for cardiovascular complications. Due to this, nephrectomy had to be rescheduled until now.   He will be undergoing Lovenox bridge prior to surgery. Last day of Eliquis is 6/10.  BP has been elevated and his PCP recently started him on Telmisartan and Metoprolol.    VS: BP (!) 143/99   Pulse 85   Temp 37.3 C (Oral)   Resp 14   Ht 6\' 4"  (1.93 m)   Wt (!) 141.1 kg   SpO2 100%   BMI 37.86 kg/m   PROVIDERS: Corwin Levins, MD Hematology: Rachel Moulds, MD  LABS: Labs reviewed: Acceptable for surgery. SCr noted to be 1.5 which is up from baseline, his PCP is aware and advised to increase fluid intake. Hgb noted to be 10.8 which is down from baseline. Repeat CBC and BMP DOS (all labs ordered are listed, but only abnormal results are displayed)  Labs Reviewed  BASIC METABOLIC PANEL - Abnormal; Notable for the following components:      Result Value   Glucose, Bld 102 (*)    Creatinine, Ser 1.52 (*)    GFR, Estimated 58 (*)    All other components within normal limits  CBC - Abnormal; Notable for the following components:   RBC 4.09 (*)    Hemoglobin  10.8 (*)    HCT 34.8 (*)    RDW 15.9 (*)    All other components within normal limits  SURGICAL PCR SCREEN  SICKLE CELL SCREEN  TYPE AND SCREEN     IMAGES:  CTA Chest 05/10/22:  IMPRESSION: 1. Significantly decreased though not entirely resolved clot burden in the right lung with persistent small volume mural adherent clot. 2. Linear opacities in the right lower lobe may reflect atelectasis or developing scar. 3. Right upper pole renal mass again seen, incompletely imaged. 4. Cholelithiasis.   EKG 03/11/22:  Sinus rhythm, rate 87   CV:  Echo 03/11/22:  IMPRESSIONS     1. Left ventricular ejection fraction, by estimation, is 60 to 65%. The  left ventricle has normal function. The left ventricle has no regional  wall motion abnormalities. Left ventricular diastolic parameters were  normal.   2. Right ventricular systolic function is normal. The right ventricular  size is normal. There is normal pulmonary artery systolic pressure.   3. The mitral valve is normal in structure. No evidence of mitral valve  regurgitation. No evidence of mitral stenosis.   4. The aortic valve is normal in structure. Aortic valve regurgitation is  not visualized. No aortic stenosis is present.   5. The inferior vena cava is normal in size with greater than 50%  respiratory variability, suggesting  right atrial pressure of 3 mmHg.   Past Medical History:  Diagnosis Date   Acute prostatitis 06/19/2007   ALLERGIC RHINITIS 06/19/2007   ANXIETY 06/19/2007   Cancer (HCC)    kidney   ELEVATED BLOOD PRESSURE WITHOUT DIAGNOSIS OF HYPERTENSION 06/19/2007   GERD 06/19/2007   GERD (gastroesophageal reflux disease)    History of COVID-19 01/24/2019   History of kidney stones    Hypertension    Inguinal hernia    Migraines    NEPHROLITHIASIS, HX OF 06/19/2007   PE (pulmonary thromboembolism) (HCC)     Past Surgical History:  Procedure Laterality Date   CYSTOSCOPY WITH RETROGRADE PYELOGRAM,  URETEROSCOPY AND STENT PLACEMENT Bilateral 02/09/2019   Procedure: CYSTOSCOPY WITH RETROGRADE PYELOGRAM, DIAGNOSTIC URETEROSCOPY AND STENT PLACEMENT;  Surgeon: Sebastian Ache, MD;  Location: [facility];  Service: Urology;  Laterality: Bilateral;  75 MINS   CYSTOSCOPY WITH RETROGRADE PYELOGRAM, URETEROSCOPY AND STENT PLACEMENT Bilateral 02/23/2019   Procedure: CYSTOSCOPY WITH RETROGRADE PYELOGRAM, URETEROSCOPY AND STENT PLACEMENT;  Surgeon: Sebastian Ache, MD;  Location[facility];  Service: Urology;  Laterality: Bilateral;  1 HR   HERNIA REPAIR     HOLMIUM LASER APPLICATION Bilateral 02/23/2019   Procedure: HOLMIUM LASER APPLICATION;  Surgeon: Sebastian Ache, MD;  Location[facility];  Service: Urology;  Laterality: Bilateral;    MEDICATIONS:  enoxaparin (LOVENOX) 120 MG/0.8ML injection   acetaminophen (TYLENOL) 500 MG tablet   albuterol (VENTOLIN HFA) 108 (90 Base) MCG/ACT inhaler   apixaban (ELIQUIS) 5 MG TABS tablet   APPLE CIDER VINEGAR PO   cetirizine (ZYRTEC) 10 MG tablet   cholecalciferol (VITAMIN D3) 25 MCG (1000 UNIT) tablet   clonazePAM (KLONOPIN) 0.5 MG tablet   iron polysaccharides (NU-IRON) 150 MG capsule   metoprolol succinate (TOPROL-XL) 25 MG 24 hr tablet   telmisartan (MICARDIS) 20 MG tablet   No current facility-administered medications for this encounter.   Marcille Blanco [facility] Short Stay/Anesthesiology [facility] Phone [phone] 06/09/2022 2:28 PM

## 2022-06-09 NOTE — Anesthesia Preprocedure Evaluation (Addendum)
Anesthesia Evaluation  Patient identified by MRN, date of birth, ID band Patient awake    Reviewed: Allergy & Precautions, NPO status , Patient's Chart, lab work & pertinent test results, reviewed documented beta blocker date and time   Airway Mallampati: II  TM Distance: >3 FB Neck ROM: Full    Dental  (+) Dental Advisory Given, Missing, Caps   Pulmonary pneumonia, resolved, former smoker, PE   Pulmonary exam normal breath sounds clear to auscultation       Cardiovascular hypertension, Pt. on medications and Pt. on home beta blockers Normal cardiovascular exam Rhythm:Regular Rate:Normal  EKG 03/11/22 NSR, Normal EKG  Echo 03/11/22 1. Left ventricular ejection fraction, by estimation, is 60 to 65%. The  left ventricle has normal function. The left ventricle has no regional  wall motion abnormalities. Left ventricular diastolic parameters were  normal.   2. Right ventricular systolic function is normal. The right ventricular  size is normal. There is normal pulmonary artery systolic pressure.   3. The mitral valve is normal in structure. No evidence of mitral valve  regurgitation. No evidence of mitral stenosis.   4. The aortic valve is normal in structure. Aortic valve regurgitation is  not visualized. No aortic stenosis is present.   5. The inferior vena cava is normal in size with greater than 50%  respiratory variability, suggesting right atrial pressure of 3 mmHg.      Neuro/Psych  Headaches  Anxiety     Cervical radiculopathy  Neuromuscular disease  negative psych ROS   GI/Hepatic Neg liver ROS,GERD  Medicated,,  Endo/Other  Obesity Hyperlipidemia  Renal/GU Renal InsufficiencyRenal diseaseHx/o renal calculi Right renal mass  negative genitourinary   Musculoskeletal negative musculoskeletal ROS (+)    Abdominal  (+) + obese  Peds  Hematology  (+) Blood dyscrasia, anemia Eliquis therapy- last dose  6/10 Lovenox bridge - last dose 6/13 pm   Anesthesia Other Findings   Reproductive/Obstetrics                             Anesthesia Physical Anesthesia Plan  ASA: 2  Anesthesia Plan: General   Post-op Pain Management: Dilaudid IV, Precedex and Ofirmev IV (intra-op)*   Induction: Intravenous and Cricoid pressure planned  PONV Risk Score and Plan: 4 or greater and Treatment may vary due to age or medical condition, Midazolam, Ondansetron and Dexamethasone  Airway Management Planned: Oral ETT  Additional Equipment: None  Intra-op Plan:   Post-operative Plan: Extubation in OR  Informed Consent: I have reviewed the patients History and Physical, chart, labs and discussed the procedure including the risks, benefits and alternatives for the proposed anesthesia with the patient or authorized representative who has indicated his/her understanding and acceptance.     Dental advisory given  Plan Discussed with: CRNA and Anesthesiologist  Anesthesia Plan Comments: (See PAT note from 6/3 by Sherlie Ban PA-C )        Anesthesia Quick Evaluation

## 2022-06-10 ENCOUNTER — Other Ambulatory Visit: Payer: Self-pay | Admitting: *Deleted

## 2022-06-10 LAB — SICKLE CELL SCREEN: Sickle Cell Screen: NEGATIVE

## 2022-06-10 MED ORDER — APIXABAN 5 MG PO TABS: 5.0000 mg | ORAL_TABLET | Freq: Two times a day (BID) | ORAL | 6 refills | Status: DC

## 2022-06-11 ENCOUNTER — Telehealth: Payer: Self-pay | Admitting: Hematology and Oncology

## 2022-06-11 NOTE — Telephone Encounter (Signed)
Spoke with patient confirming upcoming appointment  

## 2022-06-16 ENCOUNTER — Ambulatory Visit: Payer: 59 | Admitting: Hematology and Oncology

## 2022-06-18 ENCOUNTER — Encounter (HOSPITAL_COMMUNITY): Payer: Self-pay | Admitting: Urology

## 2022-06-18 ENCOUNTER — Other Ambulatory Visit: Payer: Self-pay

## 2022-06-18 ENCOUNTER — Inpatient Hospital Stay (HOSPITAL_COMMUNITY): Payer: 59 | Admitting: Medical

## 2022-06-18 ENCOUNTER — Inpatient Hospital Stay (HOSPITAL_COMMUNITY)
Admission: RE | Admit: 2022-06-18 | Discharge: 2022-06-25 | DRG: 656 | Disposition: A | Discharge status: Home / Self Care | Payer: 59 | Source: Ambulatory Visit | Attending: Urology | Admitting: Urology

## 2022-06-18 ENCOUNTER — Encounter (HOSPITAL_COMMUNITY)
Admission: RE | Disposition: A | Discharge status: Home / Self Care | Payer: Self-pay | Source: Ambulatory Visit | Attending: Urology

## 2022-06-18 DIAGNOSIS — Z86711 Personal history of pulmonary embolism: Secondary | ICD-10-CM | POA: Diagnosis not present

## 2022-06-18 DIAGNOSIS — Z87891 Personal history of nicotine dependence: Secondary | ICD-10-CM

## 2022-06-18 DIAGNOSIS — R7989 Other specified abnormal findings of blood chemistry: Secondary | ICD-10-CM

## 2022-06-18 DIAGNOSIS — Z833 Family history of diabetes mellitus: Secondary | ICD-10-CM | POA: Diagnosis not present

## 2022-06-18 DIAGNOSIS — F419 Anxiety disorder, unspecified: Secondary | ICD-10-CM | POA: Diagnosis present

## 2022-06-18 DIAGNOSIS — Z7901 Long term (current) use of anticoagulants: Secondary | ICD-10-CM | POA: Diagnosis not present

## 2022-06-18 DIAGNOSIS — Z8249 Family history of ischemic heart disease and other diseases of the circulatory system: Secondary | ICD-10-CM

## 2022-06-18 DIAGNOSIS — Z6837 Body mass index (BMI) 37.0-37.9, adult: Secondary | ICD-10-CM

## 2022-06-18 DIAGNOSIS — Z888 Allergy status to other drugs, medicaments and biological substances status: Secondary | ICD-10-CM | POA: Diagnosis not present

## 2022-06-18 DIAGNOSIS — E875 Hyperkalemia: Secondary | ICD-10-CM | POA: Diagnosis present

## 2022-06-18 DIAGNOSIS — K219 Gastro-esophageal reflux disease without esophagitis: Secondary | ICD-10-CM | POA: Diagnosis present

## 2022-06-18 DIAGNOSIS — E785 Hyperlipidemia, unspecified: Secondary | ICD-10-CM

## 2022-06-18 DIAGNOSIS — D62 Acute posthemorrhagic anemia: Secondary | ICD-10-CM | POA: Diagnosis not present

## 2022-06-18 DIAGNOSIS — I10 Essential (primary) hypertension: Secondary | ICD-10-CM | POA: Diagnosis present

## 2022-06-18 DIAGNOSIS — J189 Pneumonia, unspecified organism: Secondary | ICD-10-CM | POA: Diagnosis not present

## 2022-06-18 DIAGNOSIS — Z881 Allergy status to other antibiotic agents status: Secondary | ICD-10-CM | POA: Diagnosis not present

## 2022-06-18 DIAGNOSIS — C641 Malignant neoplasm of right kidney, except renal pelvis: Secondary | ICD-10-CM | POA: Diagnosis present

## 2022-06-18 DIAGNOSIS — Z8616 Personal history of COVID-19: Secondary | ICD-10-CM

## 2022-06-18 DIAGNOSIS — Z818 Family history of other mental and behavioral disorders: Secondary | ICD-10-CM | POA: Diagnosis not present

## 2022-06-18 DIAGNOSIS — D649 Anemia, unspecified: Principal | ICD-10-CM

## 2022-06-18 DIAGNOSIS — N2889 Other specified disorders of kidney and ureter: Secondary | ICD-10-CM | POA: Diagnosis present

## 2022-06-18 DIAGNOSIS — E669 Obesity, unspecified: Secondary | ICD-10-CM | POA: Diagnosis present

## 2022-06-18 DIAGNOSIS — R000 Tachycardia, unspecified: Secondary | ICD-10-CM | POA: Diagnosis present

## 2022-06-18 HISTORY — PX: ROBOT ASSISTED LAPAROSCOPIC NEPHRECTOMY: SHX5140

## 2022-06-18 LAB — CBC
HCT: 40.7 % (ref 39.0–52.0)
Hemoglobin: 12.5 g/dL — ABNORMAL LOW (ref 13.0–17.0)
MCH: 26.4 pg (ref 26.0–34.0)
MCHC: 30.7 g/dL (ref 30.0–36.0)
MCV: 86 fL (ref 80.0–100.0)
Platelets: 295 10*3/uL (ref 150–400)
RBC: 4.73 MIL/uL (ref 4.22–5.81)
RDW: 16 % — ABNORMAL HIGH (ref 11.5–15.5)
WBC: 5.2 10*3/uL (ref 4.0–10.5)
nRBC: 0 % (ref 0.0–0.2)

## 2022-06-18 LAB — BASIC METABOLIC PANEL
Anion gap: 11 (ref 5–15)
BUN: 13 mg/dL (ref 6–20)
CO2: 25 mmol/L (ref 22–32)
Calcium: 9.7 mg/dL (ref 8.9–10.3)
Chloride: 102 mmol/L (ref 98–111)
Creatinine, Ser: 1.49 mg/dL — ABNORMAL HIGH (ref 0.61–1.24)
GFR, Estimated: 59 mL/min — ABNORMAL LOW (ref >60)
Glucose, Bld: 102 mg/dL — ABNORMAL HIGH (ref 70–99)
Potassium: 4.1 mmol/L (ref 3.5–5.1)
Sodium: 138 mmol/L (ref 135–145)

## 2022-06-18 LAB — TYPE AND SCREEN
Antibody Screen: NEGATIVE
Unit division: 0

## 2022-06-18 LAB — ABO/RH: ABO/RH(D): A POS

## 2022-06-18 LAB — MRSA NEXT GEN BY PCR, NASAL: MRSA by PCR Next Gen: NOT DETECTED

## 2022-06-18 LAB — PREPARE RBC (CROSSMATCH)

## 2022-06-18 LAB — BPAM RBC: ISSUE DATE / TIME: 202406141533

## 2022-06-18 LAB — HEMOGLOBIN AND HEMATOCRIT, BLOOD
HCT: 34.9 % — ABNORMAL LOW (ref 39.0–52.0)
Hemoglobin: 10.7 g/dL — ABNORMAL LOW (ref 13.0–17.0)

## 2022-06-18 SURGERY — NEPHRECTOMY, RADICAL, ROBOT-ASSISTED, LAPAROSCOPIC, ADULT
Anesthesia: General | Site: Abdomen | Laterality: Right

## 2022-06-18 MED ORDER — DOCUSATE SODIUM 100 MG PO CAPS: 100.0000 mg | ORAL_CAPSULE | Freq: Two times a day (BID) | ORAL | Status: DC

## 2022-06-18 MED ORDER — SODIUM CHLORIDE 0.45 % IV SOLN: INTRAVENOUS | Status: DC

## 2022-06-18 MED ORDER — DEXAMETHASONE SODIUM PHOSPHATE 10 MG/ML IJ SOLN
INTRAMUSCULAR | Status: DC | PRN
  Administered 2022-06-18: 10 mg via INTRAVENOUS

## 2022-06-18 MED ORDER — ROCURONIUM BROMIDE 10 MG/ML (PF) SYRINGE
PREFILLED_SYRINGE | INTRAVENOUS | Status: DC | PRN
  Administered 2022-06-18: 100 mg via INTRAVENOUS
  Administered 2022-06-18 (×2): 30 mg via INTRAVENOUS
  Administered 2022-06-18: 40 mg via INTRAVENOUS
  Administered 2022-06-18: 30 mg via INTRAVENOUS

## 2022-06-18 MED ORDER — ONDANSETRON HCL 4 MG/2ML IJ SOLN: 4.0000 mg | Freq: Once | INTRAMUSCULAR | Status: DC | PRN

## 2022-06-18 MED ORDER — HYDROMORPHONE HCL 1 MG/ML IJ SOLN
0.5000 mg | INTRAMUSCULAR | Status: DC | PRN
  Administered 2022-06-18 – 2022-06-20 (×12): 1 mg via INTRAVENOUS
  Administered 2022-06-20: 0.5 mg via INTRAVENOUS
  Administered 2022-06-21 – 2022-06-25 (×20): 1 mg via INTRAVENOUS
  Filled 2022-06-18 (×35): qty 1

## 2022-06-18 MED ORDER — ALBUMIN HUMAN 5 % IV SOLN: INTRAVENOUS | Status: DC | PRN

## 2022-06-18 MED ORDER — SODIUM CHLORIDE (PF) 0.9 % IJ SOLN
INTRAMUSCULAR | Status: AC
  Filled 2022-06-18: qty 20

## 2022-06-18 MED ORDER — TRIPLE ANTIBIOTIC 3.5-400-5000 EX OINT: 1.0000 | TOPICAL_OINTMENT | Freq: Three times a day (TID) | CUTANEOUS | Status: DC | PRN

## 2022-06-18 MED ORDER — BUPIVACAINE LIPOSOME 1.3 % IJ SUSP
INTRAMUSCULAR | Status: DC | PRN
  Administered 2022-06-18: 20 mL

## 2022-06-18 MED ORDER — CEFAZOLIN IN SODIUM CHLORIDE 3-0.9 GM/100ML-% IV SOLN
3.0000 g | INTRAVENOUS | Status: AC
  Administered 2022-06-18: 3 g via INTRAVENOUS
  Filled 2022-06-18: qty 100

## 2022-06-18 MED ORDER — DIPHENHYDRAMINE HCL 50 MG/ML IJ SOLN
12.5000 mg | Freq: Four times a day (QID) | INTRAMUSCULAR | Status: DC | PRN
  Administered 2022-06-23 – 2022-06-25 (×6): 25 mg via INTRAVENOUS
  Filled 2022-06-18 (×6): qty 1

## 2022-06-18 MED ORDER — LACTATED RINGERS IV SOLN: INTRAVENOUS | Status: DC

## 2022-06-18 MED ORDER — LIDOCAINE 2% (20 MG/ML) 5 ML SYRINGE
INTRAMUSCULAR | Status: DC | PRN
  Administered 2022-06-18: 100 mg via INTRAVENOUS

## 2022-06-18 MED ORDER — DOCUSATE SODIUM 100 MG PO CAPS
100.0000 mg | ORAL_CAPSULE | Freq: Two times a day (BID) | ORAL | Status: DC
  Administered 2022-06-19 – 2022-06-24 (×10): 100 mg via ORAL
  Filled 2022-06-18 (×13): qty 1

## 2022-06-18 MED ORDER — SURGIFLO WITH THROMBIN (HEMOSTATIC MATRIX KIT) OPTIME
TOPICAL | Status: DC | PRN
  Administered 2022-06-18: 1 via TOPICAL

## 2022-06-18 MED ORDER — METOPROLOL SUCCINATE ER 25 MG PO TB24
25.0000 mg | ORAL_TABLET | Freq: Every day | ORAL | Status: DC
  Administered 2022-06-19 – 2022-06-25 (×5): 25 mg via ORAL
  Filled 2022-06-18 (×7): qty 1

## 2022-06-18 MED ORDER — ALBUTEROL SULFATE HFA 108 (90 BASE) MCG/ACT IN AERS: 2.0000 | INHALATION_SPRAY | Freq: Four times a day (QID) | RESPIRATORY_TRACT | Status: DC | PRN

## 2022-06-18 MED ORDER — MIDAZOLAM HCL 5 MG/5ML IJ SOLN
INTRAMUSCULAR | Status: DC | PRN
  Administered 2022-06-18: 2 mg via INTRAVENOUS

## 2022-06-18 MED ORDER — LACTATED RINGERS IR SOLN
Status: DC | PRN
  Administered 2022-06-18: 1000 mL

## 2022-06-18 MED ORDER — ALBUTEROL SULFATE (2.5 MG/3ML) 0.083% IN NEBU: 2.5000 mg | INHALATION_SOLUTION | Freq: Four times a day (QID) | RESPIRATORY_TRACT | Status: DC | PRN

## 2022-06-18 MED ORDER — FENTANYL CITRATE (PF) 100 MCG/2ML IJ SOLN
INTRAMUSCULAR | Status: DC | PRN
  Administered 2022-06-18: 100 ug via INTRAVENOUS

## 2022-06-18 MED ORDER — ONDANSETRON HCL 4 MG/2ML IJ SOLN
4.0000 mg | INTRAMUSCULAR | Status: DC | PRN
  Administered 2022-06-19: 4 mg via INTRAVENOUS
  Filled 2022-06-18: qty 2

## 2022-06-18 MED ORDER — DEXMEDETOMIDINE HCL IN NACL 80 MCG/20ML IV SOLN
INTRAVENOUS | Status: DC | PRN
  Administered 2022-06-18: 8 ug via INTRAVENOUS

## 2022-06-18 MED ORDER — HEMOSTATIC AGENTS (NO CHARGE) OPTIME
TOPICAL | Status: DC | PRN
  Administered 2022-06-18 (×2): 1 via TOPICAL

## 2022-06-18 MED ORDER — PROPOFOL 10 MG/ML IV BOLUS
INTRAVENOUS | Status: DC | PRN
  Administered 2022-06-18: 200 mg via INTRAVENOUS

## 2022-06-18 MED ORDER — PHENYLEPHRINE 80 MCG/ML (10ML) SYRINGE FOR IV PUSH (FOR BLOOD PRESSURE SUPPORT)
PREFILLED_SYRINGE | INTRAVENOUS | Status: DC | PRN
  Administered 2022-06-18 (×2): 80 ug via INTRAVENOUS
  Administered 2022-06-18: 160 ug via INTRAVENOUS

## 2022-06-18 MED ORDER — OXYCODONE HCL 5 MG/5ML PO SOLN: 5.0000 mg | Freq: Once | ORAL | Status: DC | PRN

## 2022-06-18 MED ORDER — FENTANYL CITRATE (PF) 100 MCG/2ML IJ SOLN
INTRAMUSCULAR | Status: AC
  Filled 2022-06-18: qty 2

## 2022-06-18 MED ORDER — ORAL CARE MOUTH RINSE: 15.0000 mL | Freq: Once | OROMUCOSAL | Status: AC

## 2022-06-18 MED ORDER — IRBESARTAN 150 MG PO TABS
75.0000 mg | ORAL_TABLET | Freq: Every day | ORAL | Status: DC
  Administered 2022-06-18 – 2022-06-19 (×2): 75 mg via ORAL
  Filled 2022-06-18 (×2): qty 1

## 2022-06-18 MED ORDER — DROPERIDOL 2.5 MG/ML IJ SOLN: 0.6250 mg | Freq: Once | INTRAMUSCULAR | Status: DC | PRN

## 2022-06-18 MED ORDER — SODIUM CHLORIDE (PF) 0.9 % IJ SOLN
INTRAMUSCULAR | Status: DC | PRN
  Administered 2022-06-18: 20 mL

## 2022-06-18 MED ORDER — HYDROMORPHONE HCL 1 MG/ML IJ SOLN
INTRAMUSCULAR | Status: AC
  Administered 2022-06-18: 0.5 mg via INTRAVENOUS
  Filled 2022-06-18: qty 1

## 2022-06-18 MED ORDER — MIDAZOLAM HCL 2 MG/2ML IJ SOLN
INTRAMUSCULAR | Status: AC
  Filled 2022-06-18: qty 2

## 2022-06-18 MED ORDER — ORAL CARE MOUTH RINSE: 15.0000 mL | OROMUCOSAL | Status: DC | PRN

## 2022-06-18 MED ORDER — POLYSACCHARIDE IRON COMPLEX 150 MG PO CAPS
150.0000 mg | ORAL_CAPSULE | Freq: Every day | ORAL | Status: DC
  Administered 2022-06-19 – 2022-06-25 (×7): 150 mg via ORAL
  Filled 2022-06-18 (×7): qty 1

## 2022-06-18 MED ORDER — OXYCODONE HCL 5 MG PO TABS
5.0000 mg | ORAL_TABLET | ORAL | Status: DC | PRN
  Administered 2022-06-18 – 2022-06-25 (×12): 5 mg via ORAL
  Filled 2022-06-18 (×13): qty 1

## 2022-06-18 MED ORDER — HYDROCODONE-ACETAMINOPHEN 5-325 MG PO TABS: 1.0000 | ORAL_TABLET | Freq: Four times a day (QID) | ORAL | 0 refills | Status: DC | PRN

## 2022-06-18 MED ORDER — DIPHENHYDRAMINE HCL 12.5 MG/5ML PO ELIX: 12.5000 mg | ORAL_SOLUTION | Freq: Four times a day (QID) | ORAL | Status: DC | PRN

## 2022-06-18 MED ORDER — HYDROMORPHONE HCL 1 MG/ML IJ SOLN
INTRAMUSCULAR | Status: AC
  Administered 2022-06-18: 0.5 mg via INTRAVENOUS
  Filled 2022-06-18: qty 2

## 2022-06-18 MED ORDER — HYDROMORPHONE HCL 1 MG/ML IJ SOLN
0.2500 mg | INTRAMUSCULAR | Status: DC | PRN
  Administered 2022-06-18 (×2): 0.5 mg via INTRAVENOUS

## 2022-06-18 MED ORDER — PROPOFOL 10 MG/ML IV BOLUS
INTRAVENOUS | Status: AC
  Filled 2022-06-18: qty 20

## 2022-06-18 MED ORDER — DEXAMETHASONE SODIUM PHOSPHATE 10 MG/ML IJ SOLN
INTRAMUSCULAR | Status: AC
  Filled 2022-06-18: qty 1

## 2022-06-18 MED ORDER — STERILE WATER FOR IRRIGATION IR SOLN
Status: DC | PRN
  Administered 2022-06-18: 1000 mL

## 2022-06-18 MED ORDER — HYOSCYAMINE SULFATE 0.125 MG SL SUBL: 0.1250 mg | SUBLINGUAL_TABLET | SUBLINGUAL | Status: DC | PRN

## 2022-06-18 MED ORDER — ACETAMINOPHEN 10 MG/ML IV SOLN
INTRAVENOUS | Status: AC
  Administered 2022-06-18: 1000 mg via INTRAVENOUS
  Filled 2022-06-18: qty 100

## 2022-06-18 MED ORDER — MAGNESIUM CITRATE PO SOLN: 1.0000 | Freq: Once | ORAL | Status: DC

## 2022-06-18 MED ORDER — CHLORHEXIDINE GLUCONATE CLOTH 2 % EX PADS
6.0000 | MEDICATED_PAD | Freq: Every day | CUTANEOUS | Status: DC
  Administered 2022-06-18 – 2022-06-19 (×2): 6 via TOPICAL

## 2022-06-18 MED ORDER — KETAMINE HCL 50 MG/5ML IJ SOSY
PREFILLED_SYRINGE | INTRAMUSCULAR | Status: AC
  Filled 2022-06-18: qty 5

## 2022-06-18 MED ORDER — KETAMINE HCL 10 MG/ML IJ SOLN
INTRAMUSCULAR | Status: DC | PRN
  Administered 2022-06-18 (×2): 20 mg via INTRAVENOUS
  Administered 2022-06-18: 10 mg via INTRAVENOUS

## 2022-06-18 MED ORDER — SUGAMMADEX SODIUM 200 MG/2ML IV SOLN
INTRAVENOUS | Status: DC | PRN
  Administered 2022-06-18: 200 mg via INTRAVENOUS

## 2022-06-18 MED ORDER — ACETAMINOPHEN 10 MG/ML IV SOLN
1000.0000 mg | Freq: Four times a day (QID) | INTRAVENOUS | Status: AC
  Administered 2022-06-19 (×3): 1000 mg via INTRAVENOUS
  Filled 2022-06-18 (×3): qty 100

## 2022-06-18 MED ORDER — OXYCODONE HCL 5 MG PO TABS: 5.0000 mg | ORAL_TABLET | Freq: Once | ORAL | Status: DC | PRN

## 2022-06-18 MED ORDER — ONDANSETRON HCL 4 MG/2ML IJ SOLN
INTRAMUSCULAR | Status: DC | PRN
  Administered 2022-06-18: 4 mg via INTRAVENOUS

## 2022-06-18 MED ORDER — CHLORHEXIDINE GLUCONATE 0.12 % MT SOLN
15.0000 mL | Freq: Once | OROMUCOSAL | Status: AC
  Administered 2022-06-18: 15 mL via OROMUCOSAL

## 2022-06-18 MED ORDER — ONDANSETRON HCL 4 MG/2ML IJ SOLN
INTRAMUSCULAR | Status: AC
  Filled 2022-06-18: qty 2

## 2022-06-18 SURGICAL SUPPLY — 74 items
ADH SKN CLS APL DERMABOND .7 (GAUZE/BANDAGES/DRESSINGS) ×2
AGENT HMST KT MTR STRL THRMB (HEMOSTASIS) ×1
APL ESCP 34 STRL LF DISP (HEMOSTASIS) ×1
APL PRP STRL LF DISP 70% ISPRP (MISCELLANEOUS) ×1
APPLICATOR SURGIFLO ENDO (HEMOSTASIS) IMPLANT
BAG COUNTER SPONGE SURGICOUNT (BAG) ×1 IMPLANT
BAG LAPAROSCOPIC 12 15 PORT 16 (BASKET) ×1 IMPLANT
BAG RETRIEVAL 12/15 (BASKET) ×1
BAG SPNG CNTER NS LX DISP (BAG) ×1
CHLORAPREP W/TINT 26 (MISCELLANEOUS) ×1 IMPLANT
CLIP LIGATING HEM O LOK PURPLE (MISCELLANEOUS) ×1 IMPLANT
CLIP LIGATING HEMO LOK XL GOLD (MISCELLANEOUS) ×1 IMPLANT
CLIP LIGATING HEMO O LOK GREEN (MISCELLANEOUS) ×1 IMPLANT
COVER SURGICAL LIGHT HANDLE (MISCELLANEOUS) ×1 IMPLANT
COVER TIP SHEARS 8 DVNC (MISCELLANEOUS) ×1 IMPLANT
CUTTER ECHEON FLEX ENDO 45 340 (ENDOMECHANICALS) IMPLANT
DERMABOND ADVANCED .7 DNX12 (GAUZE/BANDAGES/DRESSINGS) ×2 IMPLANT
DRAIN CHANNEL 15F RND FF 3/16 (WOUND CARE) IMPLANT
DRAPE ARM DVNC X/XI (DISPOSABLE) ×4 IMPLANT
DRAPE COLUMN DVNC XI (DISPOSABLE) ×1 IMPLANT
DRAPE INCISE IOBAN 66X45 STRL (DRAPES) ×1 IMPLANT
DRAPE SHEET LG 3/4 BI-LAMINATE (DRAPES) ×1 IMPLANT
DRIVER NDL LRG 8 DVNC XI (INSTRUMENTS) ×2 IMPLANT
DRIVER NDLE LRG 8 DVNC XI (INSTRUMENTS) ×2 IMPLANT
DRSG TEGADERM 4X4.75 (GAUZE/BANDAGES/DRESSINGS) IMPLANT
ELECT PENCIL ROCKER SW 15FT (MISCELLANEOUS) ×1 IMPLANT
ELECT REM PT RETURN 15FT ADLT (MISCELLANEOUS) ×1 IMPLANT
EVACUATOR SILICONE 100CC (DRAIN) IMPLANT
FORCEPS BPLR FENES DVNC XI (FORCEP) ×1 IMPLANT
FORCEPS PROGRASP DVNC XI (FORCEP) ×1 IMPLANT
GAUZE SPONGE 2X2 8PLY STRL LF (GAUZE/BANDAGES/DRESSINGS) IMPLANT
GLOVE BIO SURGEON STRL SZ 6.5 (GLOVE) ×1 IMPLANT
GLOVE SURG LX STRL 7.5 STRW (GLOVE) ×2 IMPLANT
GOWN SRG XL LVL 4 BRTHBL STRL (GOWNS) ×1 IMPLANT
GOWN STRL NON-REIN XL LVL4 (GOWNS) ×1
GOWN STRL REUS W/ TWL XL LVL3 (GOWN DISPOSABLE) ×2 IMPLANT
GOWN STRL REUS W/TWL XL LVL3 (GOWN DISPOSABLE) ×2
HEMOSTAT SURGICEL 4X8 (HEMOSTASIS) IMPLANT
HOLDER FOLEY CATH W/STRAP (MISCELLANEOUS) ×1 IMPLANT
IRRIG SUCT STRYKERFLOW 2 WTIP (MISCELLANEOUS) ×1
IRRIGATION SUCT STRKRFLW 2 WTP (MISCELLANEOUS) ×1 IMPLANT
KIT BASIN OR (CUSTOM PROCEDURE TRAY) ×1 IMPLANT
KIT TURNOVER KIT A (KITS) IMPLANT
LOOP VESSEL MAXI BLUE (MISCELLANEOUS) IMPLANT
NDL INSUFFLATION 14GA 120MM (NEEDLE) ×1 IMPLANT
NEEDLE INSUFFLATION 14GA 120MM (NEEDLE) ×1 IMPLANT
PORT ACCESS TROCAR AIRSEAL 12 (TROCAR) ×1 IMPLANT
PROTECTOR NERVE ULNAR (MISCELLANEOUS) ×2 IMPLANT
RELOAD STAPLE 45 2.6 WHT THIN (STAPLE) IMPLANT
RELOAD STAPLE 45 4.1 GRN THCK (STAPLE) IMPLANT
SCISSORS MNPLR CVD DVNC XI (INSTRUMENTS) ×1 IMPLANT
SEAL UNIV 5-12 XI (MISCELLANEOUS) ×4 IMPLANT
SET TRI-LUMEN FLTR TB AIRSEAL (TUBING) ×1 IMPLANT
SOL ELECTROSURG ANTI STICK (MISCELLANEOUS) ×1
SOLUTION ELECTROSURG ANTI STCK (MISCELLANEOUS) ×1 IMPLANT
SPIKE FLUID TRANSFER (MISCELLANEOUS) ×1 IMPLANT
SPONGE T-LAP 4X18 ~~LOC~~+RFID (SPONGE) IMPLANT
STAPLE RELOAD 45 GRN (STAPLE) ×3 IMPLANT
STAPLE RELOAD 45 WHT (STAPLE) ×1 IMPLANT
STAPLE RELOAD 45MM GREEN (STAPLE) ×3
STAPLE RELOAD 45MM WHITE (STAPLE) ×1
SURGIFLO W/THROMBIN 8M KIT (HEMOSTASIS) IMPLANT
SUT ETHILON 3 0 PS 1 (SUTURE) IMPLANT
SUT MNCRL AB 4-0 PS2 18 (SUTURE) ×2 IMPLANT
SUT PDS AB 1 CT1 27 (SUTURE) ×3 IMPLANT
SUT VIC AB 2-0 SH 27 (SUTURE) ×1
SUT VIC AB 2-0 SH 27X BRD (SUTURE) ×1 IMPLANT
SUT VICRYL 0 UR6 27IN ABS (SUTURE) IMPLANT
TOWEL OR 17X26 10 PK STRL BLUE (TOWEL DISPOSABLE) ×1 IMPLANT
TRAY FOLEY MTR SLVR 16FR STAT (SET/KITS/TRAYS/PACK) ×1 IMPLANT
TRAY LAPAROSCOPIC (CUSTOM PROCEDURE TRAY) ×1 IMPLANT
TROCAR Z THREAD OPTICAL 12X100 (TROCAR) ×1 IMPLANT
TROCAR Z-THREAD OPTICAL 5X100M (TROCAR) IMPLANT
WATER STERILE IRR 1000ML POUR (IV SOLUTION) ×1 IMPLANT

## 2022-06-18 NOTE — Transfer of Care (Signed)
Immediate Anesthesia Transfer of Care Note  Patient: Marcus Burns  Procedure(s) Performed: Procedure(s) with comments: XI ROBOTIC ASSISTED LAPAROSCOPIC RADICAL NEPHRECTOMY AND PERICAVAL LYMPH NODE DISSECTION (Right) - 3 HRS  Patient Location: PACU  Anesthesia Type:General  Level of Consciousness: Alert, Awake, Oriented  Airway & Oxygen Therapy: Patient Spontanous Breathing  Post-op Assessment: Report given to RN  Post vital signs: Reviewed and stable  Last Vitals:  Vitals:   06/18/22 0851  BP: (!) 147/102  Pulse: 80  Resp: 18  Temp: 37.2 C  SpO2: 100%    Complications: No apparent anesthesia complications

## 2022-06-18 NOTE — Anesthesia Procedure Notes (Signed)
Procedure Name: Intubation Date/Time: 06/18/2022 12:05 PM  Performed by: Basilio Cairo, CRNAPre-anesthesia Checklist: Patient identified, Patient being monitored, Timeout performed, Emergency Drugs available and Suction available Patient Re-evaluated:Patient Re-evaluated prior to induction Oxygen Delivery Method: Circle system utilized Preoxygenation: Pre-oxygenation with 100% oxygen Induction Type: IV induction Ventilation: Mask ventilation without difficulty Laryngoscope Size: Mac and 3 Grade View: Grade I Tube type: Oral Tube size: 7.5 mm Number of attempts: 1 Airway Equipment and Method: Stylet Placement Confirmation: ETT inserted through vocal cords under direct vision, positive ETCO2 and breath sounds checked- equal and bilateral Secured at: 23 cm Tube secured with: Tape Dental Injury: Teeth and Oropharynx as per pre-operative assessment

## 2022-06-18 NOTE — Discharge Instructions (Signed)

## 2022-06-18 NOTE — Anesthesia Postprocedure Evaluation (Signed)
Anesthesia Post Note  Patient: Marcus Burns  Procedure(s) Performed: XI ROBOTIC ASSISTED LAPAROSCOPIC RADICAL NEPHRECTOMY AND PERICAVAL LYMPH NODE DISSECTION (Right: Abdomen)     Patient location during evaluation: PACU Anesthesia Type: General Level of consciousness: awake and alert and oriented Pain management: pain level controlled Vital Signs Assessment: post-procedure vital signs reviewed and stable Respiratory status: spontaneous breathing, nonlabored ventilation and respiratory function stable Cardiovascular status: blood pressure returned to baseline and stable Postop Assessment: no apparent nausea or vomiting Anesthetic complications: no   No notable events documented.  Last Vitals:  Vitals:   06/18/22 1700 06/18/22 1715  BP: 139/81 124/70  Pulse: 81 78  Resp: 19 16  Temp:    SpO2: 100% 93%    Last Pain:  Vitals:   06/18/22 0914  TempSrc:   PainSc: 0-No pain                 Kemet Nijjar A.

## 2022-06-18 NOTE — H&P (Signed)
Marcus Burns is an 42 y.o. male.    Chief Complaint: Pre-Op RIGHT robotic radical nephrectomy  HPI:   1 - Recurrent Urolithiasis -  Pre 2021 - medical passage x many  02/2019 - Bilateral ureteroscopy to stone free for Rt ureteral / bilateral renal stones. Required pre-stenting as ureters very narrow.   Recent Surveillance:  02/2022 - CT - left mild nephrocalcinosis only, no sig stones   2 - Medical STone Disease / Midl hypocitraturia / Mild Oliguria -  Eval 2021: BMP, PTH, Urate - normal; Composition - 90% CaOx / 10% Urate; 24 Hr Urines - mild low vol (1.7L), mild low citrate (379) ==> BID cyrstal lite in 12 oz water   3 - Large Right Renal Cancer - 10cm Rt upper enhancing solid mass with central necrosis new on Abd CT 02/2022. Appears cortical from upper pole. 1 artery / 1 vein right renovascular anatomy. This appears new since CT 2021 suggesting very rapid progression. No known sickle disease / trait.   4 - Pulmonary Embolism - large symptomatic PE by CT 03/2022 on eval shortness of breath. Placed on eliquus with repeat chest imaging pending for late June 2024. Managed by Rachel Moulds MD with heme-onc. Repeat CT 05/2022 with minimal clot and asymptoamtic, cleared for surgery.   PMH sig for obesity, LIH. His PCP is Oliver Barre MD. 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 His wife Marchelle Folks is involved and also in healthcare They have 14yo and 17yo kids.   Today " Marcus Burns " is seen to proceed with RIGHT robotic radical nephrectomy. He has been on lovenox bridge as directed, and that has been held >24 hours. Hgb 10.8, Ct 1.52.    Past Medical History:  Diagnosis Date   Acute prostatitis 06/19/2007   ALLERGIC RHINITIS 06/19/2007   ANXIETY 06/19/2007   Cancer (HCC)    kidney   ELEVATED BLOOD PRESSURE WITHOUT DIAGNOSIS OF HYPERTENSION 06/19/2007   GERD 06/19/2007   GERD (gastroesophageal reflux disease)    History of COVID-19 01/24/2019   History of kidney  stones    Hypertension    Inguinal hernia    Migraines    NEPHROLITHIASIS, HX OF 06/19/2007   PE (pulmonary thromboembolism) (HCC)     Past Surgical History:  Procedure Laterality Date   CYSTOSCOPY WITH RETROGRADE PYELOGRAM, URETEROSCOPY AND STENT PLACEMENT Bilateral 02/09/2019   Procedure: CYSTOSCOPY WITH RETROGRADE PYELOGRAM, DIAGNOSTIC URETEROSCOPY AND STENT PLACEMENT;  Surgeon: Sebastian Ache, MD;  Location: [facility];  Service: Urology;  Laterality: Bilateral;  75 MINS   CYSTOSCOPY WITH RETROGRADE PYELOGRAM, URETEROSCOPY AND STENT PLACEMENT Bilateral 02/23/2019   Procedure: CYSTOSCOPY WITH RETROGRADE PYELOGRAM, URETEROSCOPY AND STENT PLACEMENT;  Surgeon: Sebastian Ache, MD;  Location: [facility];  Service: Urology;  Laterality: Bilateral;  1 HR   HERNIA REPAIR     HOLMIUM LASER APPLICATION Bilateral 02/23/2019   Procedure: HOLMIUM LASER APPLICATION;  Surgeon: Sebastian Ache, MD;  Location: [facility];  Service: Urology;  Laterality: Bilateral;    Family History  Problem Relation Age of Onset   Coronary artery disease Father    Anxiety disorder Mother    Anxiety disorder Brother    Diabetes Brother    Social History:  reports that he has quit smoking. He has quit using smokeless tobacco. He reports that he does not drink alcohol and does not use drugs.  Allergies:  Allergies  Allergen Reactions   Amlodipine     Edema ( 5 mg)  Doxycycline Other (See Comments)    Light headed , passed out    No medications prior to admission.    No results found for this or any previous visit (from the past 48 hour(s)). No results found.  Review of Systems  Constitutional:  Negative for chills and fever.  All other systems reviewed and are negative.   There were no vitals taken for this visit. Physical Exam Vitals reviewed.  Constitutional:      Comments: Very pleasant, at baseline.Marland Kitchen   HENT:     Head: Normocephalic.  Eyes:     Pupils: Pupils are equal, round, and reactive to light.   Cardiovascular:     Rate and Rhythm: Normal rate.  Pulmonary:     Effort: Pulmonary effort is normal.  Abdominal:     General: Abdomen is flat.     Comments: Stable moderate truncal obesity  Genitourinary:    Comments: No CVAT Musculoskeletal:        General: Normal range of motion.     Cervical back: Normal range of motion.  Skin:    General: Skin is warm.  Neurological:     General: No focal deficit present.     Mental Status: He is alert.  Psychiatric:        Mood and Affect: Mood normal.      Assessment/Plan  Proceed as planned with RIGHT robotic radical nephrectomy. Risks, benefits, alternatives, expected peri-op course discussed previously and reiterated today. He understands that large nature of mass and his recent PE and treatmetn of that increas risk of complications such as acute blood loss, pulm failure, and mortality.   Loletta Parish., MD 06/18/2022, 6:48 AM

## 2022-06-18 NOTE — Op Note (Signed)
NAMEJAGAN, VODA MEDICAL RECORD NO: [id number] ACCOUNT NO: [id number] DATE OF BIRTH: 08-04-1980 FACILITY: [facility] LOCATION: [id number] PHYSICIAN: Sebastian Ache, MD  Operative Report   DATE OF PROCEDURE: 06/18/2022  PREOPERATIVE DIAGNOSES:  Rapidly progressive right renal mass.  History of pulmonary embolism.  POSTOPERATIVE DIAGNOSES: Rapidly progressive right renal mass, history of pulmonary embolism, likely stage IV renal cancer with superficial hepatic invasion, hilar adenopathy, query tumor thrombus.  ASSISTANT:  Harrie Foreman, PA  ESTIMATED BLOOD LOSS:  550 mL  PROCEDURE:  Right robotic radical nephrectomy with retroperitoneal lymph node dissection.  FINDINGS:   1.  Very aggressive appearing right upper pole renal mass with innumerable parasitic vessels, hilar adenopathy, superficial hepatic invasion. 2.  Single artery, single vein right renovascular anatomy.  Artery completely encased in desmoplastic reaction around the area of the renal vein, less desmoplastic reaction in the plane between the aorta and inferior vena cava.  The artery was taken.   3.  Query tumor thrombus, right renal vein very large caliber.  INDICATIONS:  The patient is an incredibly pleasant 42 year old man with remote history of urolithiasis.  He was treated for that in 2021 and in 2024 was noted to have a large right renal mass approximately 10 cm. As it appeared to be new since 2021,  this was highly concerning for a very aggressive neoplasm and he was expeditiously counseled towards the right radical nephrectomy as at this point, he had no obvious signs of locally advanced or distant disease.  Unfortunately, he developed symptomatic  pulmonary embolism as only risk factor being his renal malignancy.  He was appropriately placed on anticoagulation for several months under the very aggressive amanagement of medical oncology colleagues.  Repeat chest CT revealed near complete  resolution of his pulmonary  embolism and he had essentially resolved all symptoms of this and given the aggressive nature of his malignancy, clinically both his medical oncologist and myself felt it would be prudent to proceed with nephrectomy for  maximal oncologic benefit.  He was bridged with Lovenox as per instructions and presents for nephrectomy today.  Informed consent was obtained and placed in medical record.  PROCEDURE IN DETAIL:  The patient being himself verified, procedure being right robotic radical nephrectomy was confirmed.  Procedure timeout was performed.  Intravenous administered.  General endotracheal anesthesia induced.  Foley catheter was placed  per urethra to straight drain.  The patient placed into right side up full flank position,pulling 15 degrees of table flexion, superior arm elevator, axillary roll,sequential compression devices, bottom leg bent, top leg straight.  He was further  fastened to operating table using 3-inch tape over foam padding across the supraxiphoid chest and his pelvis.  Beanbag was deployed as was superior arm elevator. All bony prominences were carefully padded with foam and a sterile field was created,  prepped and draped the patient's right flank and abdomen using chlorhexidine gluconate and a high flow, low pressure, pneumoperitoneum was obtained using Veress technique in the right lower quadrant, having passed the aspiration drop test.  Next, an 8 mm  robotic camera port was placed in position approximately 1/2 handbreadth superior lateral to the umbilicus.  Laparoscopic examination of peritoneal cavity revealed a very large liver and already some parasitic vessels were seen coursing towards the area  of the right renal neoplasm.  Additional ports were placed as follows:  Right subcostal 8 mm robotic port, right far lateral 8 mm robotic port approximately 4 fingerbreadths superior medial to the anterior iliac spine, right  paramedian inferior robotic  port approximately 1  handbreadth superior to pubic ramus, two 12 mm assistant port sites to midline, one approximately 3 fingerbreadths inferior to the plane of the camera port, one approximately 3 fingerbreadths superior and a 5 mm port for liver  retraction in the subxiphoid location.  Robot was docked and passed the electronic checks.  Initial attention was directed at initial hepatic mobilization.  Liver was quite large and the inferior aspect of this was carefully mobilized superiorly.  There  were several loose adhesions between the omentum and the inferior aspect of the liver and a self-locking laparoscopic grasper was used with a 5 mm port to retract the inferior border of the liver superiorly to allow better visualization and access to the  anterior surface Gerota's fascia and the hilar area. Immediately, the mass in the upper pole was noted to be in direct apposition to the liver, but at this point no gross invasion was appreciated.  The lower pole of the kidney area was identified,  placed on gentle lateral traction.  The ascending colon was mobilized.    However, the area of the cecum did require some mobilization by incising lateral  posterior peritoneum and further developing the retroperitoneum.  Lower pole of the kidney area was again identified and dissection proceeded medial to this.  The area of the ureter and gonadal vessels were noted.  The gonadal vessels were allowed to  fall medially.  The ureter was carefully swept laterally.  Dissection proceeded in this plane towards the area of renal hilum.  As we approached the renal hilum, tissue became progressively more edematous, desmoplastic and there were innumerable  parasitic vessels and essentially in all planes medially, laterally and inferiorly.  At this point, the renal vein was identified.  The caliber of this was quite large and there was some concern based on this of bland versus possible malignant thrombus;  however, the extent of thrombus appeared to  be such that it ended before the level of the cava and did appear amenable to continuing invasive approach.  There was significant desmoplastic reaction, especially in the posterior aspect of the vein and very  careful dissection was performed with initial effort to identify the renal artery in typical location behind the vein.  Pulsations of the artery were noted.  However, given the severe desmoplasia and numerous parasitic vessels coursing towards the  inferior aspect of the renal vein, the artery could not be safely circumferentially mobilized at this location. Given the patient's young age and desire for agressive treatment, As such we proceeding and mobilized the duodenum, kocherizing, allowing it to  fall lateral to the aorta and I began dissecting the area between the aorta and vena cava in effort to isolate the right renal artery in this location.  The left renal vein was encountered.  It did not appear to be involved with any sort of thrombus or  tumor directly and inferior to the left renal vein, the right renal artery was encountered.  It was quite large caliber; however, at this very medial location, there was a significant less desmoplasia and this was able to circumferentially mobilized.  This was controlled using extra-large Hem-o-lok clip proximal, vascular stapler distal, which resulted in excellent hemostatic control of the right renal artery.  I was quite happy with the safety of this dissection and maintenance of integrity of all  left renal structures including the left renal vein.  Attention was redirected to the hilum and having control  of the arterial inflow, I felt much more comfortable with controlling the vein en bloc with the artery as the artery had now essentially zero  flow.  This was carefully controlled using green load stapler given the large caliber in a position that appeared to be medial to any obvious thrombus.  This resulted in quite satisfactory hemostatic control of  the hilum.  Notably, planned resection was  chosen directly on the inferior vena cava as there was some adenopathy in the area, both inferior and superior to the hilum.  Thus nearly performing a very aggressive retroperitoneal lymph node dissection.  Having controlled the hilar structures, the  medial plane was continued superiorly. The adrenal gland was completely taken with the radical nephrectomy specimen.  The kidney was then further placed on inferior traction to allow to elevate the superior plane.  Unfortunately, the superior plane  between the kidney and the liver was completely obliterated and there was high suspicion of superficial hepatic involvement directly from the upper pole tumor.  Again, the involvement appeared to be at a level that I felt that very careful resection  could still proceed and I circumferentially dissected out the interface between the liver and tumor that is exquisitely careful technique using high current monopolar at the area of the liver as needed.  There was inherently superficial hepatic resection  with this, which again I felt was safe and warranted given the interface and the patient's young age and desire for maximal oncologic benefit, having separated the superior plane from the liver, which I again strongly felt that this probably was likely  not margin free resection, but consistent with maximal cytoreduction.  We then freed up the lateral plane and there were numerous parasitic vessels, which were very carefully controlled using a combination of vascular stapling, vascular clipping and  bipolar energy.  Similarly on the inferior plane, ureter was stapled in one of the packets.  This completely freed up the large right radical nephrectomy specimen.  The area of hepatic interface was very carefully inspected.  Additional coagulation  current applied as needed, which resulted in quite good hemostasis.  I then placed FloSeal and Surgicel in a packing-type fashion  against the hepatic interface, took down the liver retractor allowing the liver to also provide tamponade.  Again,  hemostasis was quite excellent.  We achieved the goals of the extirpated portion of procedure today.  Given the numerous parasitic vessels and aggressive hepatic interface resection, I elected to place a Jackson-Pratt drain medial lateral most robotic  port site into the peritoneal cavity.  Specimen was retrieved by extending the previous assistant port sites in the midline and then even further inferiorly for a total distance of approximately 12 cm and a large right radical nephrectomy specimen was  removed and set aside for permanent pathology.  Extraction site was closed with fascia using figure-of-eight PDS times approximately 10 followed by reapproximation of Scarpa's with running Vicryl.  All incision sites were infiltrated with dilute  lipolyzed Marcaine and closed at the level of skin using subcuticular Monocryl for Dermabond.  Procedure was then terminated.  The patient tolerated the procedure well, no immediate perioperative complications.  The patient was taken to postanesthesia  care in stable condition and planned for step-down admission for very close monitoring.  We will hold his anticoagulation for now, verifying hemodynamic stability before resuming in a bridging-type fashion.  Please note, first assistant, Harrie Foreman, was crucial for all portions of surgery today.  She provided invaluable  retraction, suctioning, vascular clipping, vascular stapling, robotic instrument exchange and general first assistance.      PAA D: 06/18/2022 6:38:44 pm T: 06/18/2022 11:21:00 pm  JOB: [id number]/ [id number]

## 2022-06-18 NOTE — Brief Op Note (Signed)
06/18/2022  6:21 PM  PATIENT:  Marcus Burns  41 y.o. male  PRE-OPERATIVE DIAGNOSIS:  RIGHT RENAL MASS  POST-OPERATIVE DIAGNOSIS:  RIGHT RENAL MASS with Hepatic Invasion and Hilar Adenopathy  PROCEDURE:  Procedure(s) with comments: XI ROBOTIC ASSISTED LAPAROSCOPIC RADICAL NEPHRECTOMY AND PERICAVAL LYMPH NODE DISSECTION (Right) - 3 HRS  SURGEON:  Surgeon(s) and Role:    * Louisa Favaro, Delbert Phenix., MD - Primary  PHYSICIAN ASSISTANT:   ASSISTANTS: Harrie Foreman PA   ANESTHESIA:   local and general  EBL:  550 mL   BLOOD ADMINISTERED:none  DRAINS:  1- JP to bulb; 2 - Foley to gravity    LOCAL MEDICATIONS USED:  MARCAINE     SPECIMEN:  Source of Specimen:  Right kidney with paracaval lymph nodes  DISPOSITION OF SPECIMEN:  PATHOLOGY  COUNTS:  YES  TOURNIQUET:  * No tourniquets in log *  DICTATION: .Other Dictation: Dictation Number [id number]  PLAN OF CARE: Admit to inpatient   PATIENT DISPOSITION:  PACU - hemodynamically stable.   Delay start of Pharmacological VTE agent (>24hrs) due to surgical blood loss or risk of bleeding: yes

## 2022-06-19 ENCOUNTER — Encounter (HOSPITAL_COMMUNITY): Payer: Self-pay | Admitting: Urology

## 2022-06-19 ENCOUNTER — Other Ambulatory Visit: Payer: Self-pay

## 2022-06-19 LAB — BASIC METABOLIC PANEL
Anion gap: 9 (ref 5–15)
BUN: 18 mg/dL (ref 6–20)
CO2: 24 mmol/L (ref 22–32)
Calcium: 8.6 mg/dL — ABNORMAL LOW (ref 8.9–10.3)
Chloride: 101 mmol/L (ref 98–111)
Creatinine, Ser: 1.81 mg/dL — ABNORMAL HIGH (ref 0.61–1.24)
GFR, Estimated: 47 mL/min — ABNORMAL LOW (ref >60)
Glucose, Bld: 113 mg/dL — ABNORMAL HIGH (ref 70–99)
Potassium: 5.9 mmol/L — ABNORMAL HIGH (ref 3.5–5.1)
Sodium: 134 mmol/L — ABNORMAL LOW (ref 135–145)

## 2022-06-19 LAB — HEMOGLOBIN AND HEMATOCRIT, BLOOD
HCT: 33.8 % — ABNORMAL LOW (ref 39.0–52.0)
Hemoglobin: 10.3 g/dL — ABNORMAL LOW (ref 13.0–17.0)

## 2022-06-19 LAB — POTASSIUM: Potassium: 4.2 mmol/L (ref 3.5–5.1)

## 2022-06-19 MED ORDER — SODIUM CHLORIDE 0.9 % IV BOLUS
500.0000 mL | Freq: Once | INTRAVENOUS | Status: AC
  Administered 2022-06-19: 500 mL via INTRAVENOUS

## 2022-06-19 NOTE — Progress Notes (Signed)
PT ambulated from bed to chair which was approximately 10 feet. PT continues to have high level of pain and complains of " burning/shooting" sensation in his midline incision. PT tolerated sitting up in chair for two hours before requesting to go back to bed. Foley removed at 1230. Due to void by 1830.

## 2022-06-19 NOTE — Progress Notes (Signed)
1 Day Post-Op Subjective: NAEO. Abdominal/incisional pain. Requiring IV dilaudid for pain control. Otherwise uneventful night  Objective: Vital signs in last 24 hours: Temp:  [97.8 F (36.6 C)-98.5 F (36.9 C)] 98.5 F (36.9 C) (06/15 0800) Pulse Rate:  [66-82] 82 (06/15 0800) Resp:  [11-27] 20 (06/15 0800) BP: (103-146)/(70-100) 112/80 (06/15 0800) SpO2:  [93 %-100 %] 99 % (06/15 0800) Weight:  [139.4 kg] 139.4 kg (06/14 1745)  Intake/Output from previous day: 06/14 0701 - 06/15 0700 In: 3602.2 [I.V.:3057.7; IV Piggyback:544.6] Out: 1945 [Urine:1225; Drains:170; Blood:550] Intake/Output this shift: No intake/output data recorded.  Physical Exam:  General: Alert and oriented CV: RRR Lungs: Clear Abdomen: Soft, ND Incisions:c/d/I. JP with s/s output GU: foley with clear yellow urine Ext: NT, No erythema  Lab Results: Recent Labs    06/18/22 0850 06/18/22 1709 06/19/22 0308  HGB 12.5* 10.7* 10.3*  HCT 40.7 34.9* 33.8*   BMET Recent Labs    06/18/22 0850 06/19/22 0308  NA 138 134*  K 4.1 5.9*  CL 102 101  CO2 25 24  GLUCOSE 102* 113*  BUN 13 18  CREATININE 1.49* 1.81*  CALCIUM 9.7 8.6*     Studies/Results: No results found.  Assessment/Plan: #Right renal mass with tumor thrombus - Regular diet, medlock when adequate PO intake - OOB to chair, light ambulation, IS - Multimodal analgesia - TOV today - Continue JP drain to bulb suction - Pending electrolytes on repeat labs, may be able to move from Stepdown to floor later this morning  #Hyperkalemia - Repeat K+; will shift PRN   LOS: 1 day   Marcus Burns 06/19/2022, 9:27 AM

## 2022-06-20 LAB — BASIC METABOLIC PANEL
Anion gap: 10 (ref 5–15)
BUN: 23 mg/dL — ABNORMAL HIGH (ref 6–20)
CO2: 23 mmol/L (ref 22–32)
Calcium: 8.7 mg/dL — ABNORMAL LOW (ref 8.9–10.3)
Chloride: 99 mmol/L (ref 98–111)
Creatinine, Ser: 1.97 mg/dL — ABNORMAL HIGH (ref 0.61–1.24)
GFR, Estimated: 42 mL/min — ABNORMAL LOW (ref >60)
Glucose, Bld: 123 mg/dL — ABNORMAL HIGH (ref 70–99)
Potassium: 4.4 mmol/L (ref 3.5–5.1)
Sodium: 132 mmol/L — ABNORMAL LOW (ref 135–145)

## 2022-06-20 LAB — CBC
HCT: 30 % — ABNORMAL LOW (ref 39.0–52.0)
Hemoglobin: 9.5 g/dL — ABNORMAL LOW (ref 13.0–17.0)
MCH: 26.9 pg (ref 26.0–34.0)
MCHC: 31.7 g/dL (ref 30.0–36.0)
MCV: 85 fL (ref 80.0–100.0)
Platelets: 235 10*3/uL (ref 150–400)
RBC: 3.53 MIL/uL — ABNORMAL LOW (ref 4.22–5.81)
RDW: 15.8 % — ABNORMAL HIGH (ref 11.5–15.5)
WBC: 9.8 10*3/uL (ref 4.0–10.5)
nRBC: 0 % (ref 0.0–0.2)

## 2022-06-20 LAB — HEMOGLOBIN AND HEMATOCRIT, BLOOD
HCT: 26.8 % — ABNORMAL LOW (ref 39.0–52.0)
Hemoglobin: 8.5 g/dL — ABNORMAL LOW (ref 13.0–17.0)

## 2022-06-20 MED ORDER — ENOXAPARIN SODIUM 80 MG/0.8ML IJ SOSY
70.0000 mg | PREFILLED_SYRINGE | Freq: Every day | INTRAMUSCULAR | Status: DC
  Administered 2022-06-20: 70 mg via SUBCUTANEOUS
  Filled 2022-06-20: qty 0.8

## 2022-06-20 MED ORDER — ACETAMINOPHEN 10 MG/ML IV SOLN: 1000.0000 mg | Freq: Four times a day (QID) | INTRAVENOUS | Status: DC

## 2022-06-20 MED ORDER — ENOXAPARIN SODIUM 40 MG/0.4ML IJ SOSY: 40.0000 mg | PREFILLED_SYRINGE | INTRAMUSCULAR | Status: DC

## 2022-06-20 MED ORDER — ACETAMINOPHEN 10 MG/ML IV SOLN
1000.0000 mg | Freq: Four times a day (QID) | INTRAVENOUS | Status: AC
  Administered 2022-06-20 (×4): 1000 mg via INTRAVENOUS
  Filled 2022-06-20 (×4): qty 100

## 2022-06-20 NOTE — Progress Notes (Signed)
Received a callback from an urologist and received new orders of IV Tylenol, 12 EKG, and CBC. After receiving IV Tylenol, pt's temp and pulse decreased. Will continue to monitor.

## 2022-06-20 NOTE — Progress Notes (Signed)
Rapid Response Event Note   Reason for Call : Temperature 100.4  and increased HR 126   Initial Focused Assessment: pt A/O and following all commands.  Lung sounds decreased.  Pt is on RA.  Pt rated his pain 5/10.  Pt has received pain medication per Primary RN.  Pt abdomen remains soft and obese.  Pt received tylenol as well.  Temperature is now 99.2 oral.  RR 20.  EKG reveals ST.  Pt HR 114.  Interventions: reviewed sepsis bundle, pt does not meet SIRS criteria as to latest VS.  See Flow sheet.  Primary RN awaiting lab results and will report to Provider as needed.  Plan of Care: Continue to monitor, if pt develops more changes, please call.  Event Summary:   MD Notified: previously per Primary RN Call Time: 0330 Arrival Time:0335 End Time: 0410  Conley Rolls, RN

## 2022-06-20 NOTE — Progress Notes (Signed)
ANTICOAGULATION CONSULT NOTE   Pharmacy Consult for enoxaparin Indication: VTE prophylaxis  Allergies  Allergen Reactions   Amlodipine     Edema ( 5 mg)   Doxycycline Other (See Comments)    Light headed , passed out    Patient Measurements: Height: 6\' 4"  (193 cm) Weight: (!) 139.4 kg (307 lb 5.1 oz) IBW/kg (Calculated) : 86.8 Heparin Dosing Weight:   Vital Signs: Temp: 99.2 F (37.3 C) (06/16 0831) Temp Source: Oral (06/16 0831) BP: 102/72 (06/16 0831) Pulse Rate: 100 (06/16 0831)  Labs: Recent Labs    06/18/22 0850 06/18/22 1709 06/19/22 0308 06/20/22 0435  HGB 12.5* 10.7* 10.3* 9.5*  HCT 40.7 34.9* 33.8* 30.0*  PLT 295  --   --  235  CREATININE 1.49*  --  1.81* 1.97*    Estimated Creatinine Clearance: 73.7 mL/min (A) (by C-G formula based on SCr of 1.97 mg/dL (H)).   Medications:  Scheduled:   Chlorhexidine Gluconate Cloth  6 each Topical Daily   docusate sodium  100 mg Oral BID   enoxaparin (LOVENOX) injection  70 mg Subcutaneous Daily   irbesartan  75 mg Oral Daily   iron polysaccharides  150 mg Oral Daily   metoprolol succinate  25 mg Oral Daily    Assessment: Pharmacy is consulted to dose enoxaparin for VTE prophylaxis.   Today, 06/20/22  Hgb 9.5 low but stable  Plt 235 TBW 139.4 kg  Scr 1.97 mg/dl, CrCL 74 ml/min   Goal of Therapy:  Anti-Xa level 0.6-1 units/ml 4hrs after LMWH dose given Monitor platelets by anticoagulation protocol: Yes   Plan:  Enoxaparin 70 mg ( 0.5 mg/kg) SQ daily for BMI 37 Monitor SCr, CBC  Monitor for signs and symptoms of bleeding     Dosage will likely remain stable at above dosage  and need for further dosage adjustment appears unlikely at present.    Will sign off at this time.  Please reconsult if a change in clinical status warrants re-evaluation of dosage.   Adalberto Cole, PharmD, BCPS 06/20/2022 9:06 AM

## 2022-06-20 NOTE — Progress Notes (Signed)
2 Days Post-Op Subjective: NAEO. Pain improving. + flatus. Tmax 100.8 overnight. Tachycardic to 120s, EKG with sinus tachycardia. Held off on fever work-up as not true surgical fever. HR and temp improved following administration of IV tylenol. Denies chest pain, SOB, difficulty breathing.  Objective: Vital signs in last 24 hours: Temp:  [98 F (36.7 C)-100.8 F (38.2 C)] 99.2 F (37.3 C) (06/16 0831) Pulse Rate:  [85-126] 100 (06/16 0831) Resp:  [17-30] 17 (06/16 0643) BP: (97-125)/(60-85) 102/72 (06/16 0831) SpO2:  [93 %-100 %] 98 % (06/16 0643)  Intake/Output from previous day: 06/15 0701 - 06/16 0700 In: 1963.1 [I.V.:1632.4; IV Piggyback:330.6] Out: 1050 [Urine:925; Drains:125] Intake/Output this shift: Total I/O In: 548.2 [I.V.:548.2] Out: 60 [Drains:60]  Physical Exam:  General: Alert and oriented CV: RRR Lungs: Clear Abdomen: Soft, ND Incisions:c/d/I. JP with s/s output GU: voiding spontaneously Ext: NT, No erythema  Lab Results: Recent Labs    06/18/22 1709 06/19/22 0308 06/20/22 0435  HGB 10.7* 10.3* 9.5*  HCT 34.9* 33.8* 30.0*    BMET Recent Labs    06/19/22 0308 06/19/22 0934 06/20/22 0435  NA 134*  --  132*  K 5.9* 4.2 4.4  CL 101  --  99  CO2 24  --  23  GLUCOSE 113*  --  123*  BUN 18  --  23*  CREATININE 1.81*  --  1.97*  CALCIUM 8.6*  --  8.7*      Studies/Results: No results found.  Assessment/Plan: #Right renal mass with tumor thrombus - Regular diet, medlocked - OOB to chair, light ambulation, IS - Multimodal analgesia - Continue JP drain to bulb suction - Prophylactic LVX today  #Tachycardia, low grade temperature - Low threshold to perform CTPA to evaluate for PE. Will defer at this time given improvement in HR with better pain control. - If any signs of chest pain, SOB, hypoxia, will order STAT CT PE  #Hyperkalemia - Resolved   LOS: 2 days   Marcus Burns 06/20/2022, 8:47 AM

## 2022-06-20 NOTE — Progress Notes (Signed)
   06/20/22 2203  Assess: MEWS Score  Temp (!) 101.2 F (38.4 C)  BP 113/70  MAP (mmHg) 81  Pulse Rate (!) 120  Resp (!) 24  SpO2 99 %  Assess: MEWS Score  MEWS Temp 1  MEWS Systolic 0  MEWS Pulse 2  MEWS RR 1  MEWS LOC 0  MEWS Score 4  MEWS Score Color Red  Assess: if the MEWS score is Yellow or Red  Were vital signs taken at a resting state? Yes  Focused Assessment Change from prior assessment (see assessment flowsheet)  Does the patient meet 2 or more of the SIRS criteria? Yes  Does the patient have a confirmed or suspected source of infection? No  MEWS guidelines implemented  Yes, red  Treat  MEWS Interventions Considered administering scheduled or prn medications/treatments as ordered  Take Vital Signs  Increase Vital Sign Frequency  Red: Q1hr x2, continue Q4hrs until patient remains green for 12hrs  Escalate  MEWS: Escalate Red: Discuss with charge nurse and notify provider. Consider notifying RRT. If remains red for 2 hours consider need for higher level of care  Notify: Charge Nurse/RN  Name of Charge Nurse/RN Notified Luren, RN  Notify: Rapid Response  Date Rapid Response Notified 06/20/22  Time Rapid Response Notified 2230  Assess: SIRS CRITERIA  SIRS Temperature  1  SIRS Pulse 1  SIRS Respirations  1  SIRS WBC 0  SIRS Score Sum  3   Red MEWS protocol starts d/t elevated temp, pulse, resp. Notified CN and rapid. Pt has scheduled IV Tylenol @ 0000 but will be given one hour early. Will continue to monitor.

## 2022-06-20 NOTE — Progress Notes (Signed)
   06/20/22 0031  Assess: MEWS Score  Temp (!) 100.4 F (38 C)  BP 124/79  MAP (mmHg) 89  Pulse Rate (!) 126  Resp 20  SpO2 94 %  O2 Device Nasal Cannula  O2 Flow Rate (L/min) 1 L/min  Assess: MEWS Score  MEWS Temp 0  MEWS Systolic 0  MEWS Pulse 2  MEWS RR 0  MEWS LOC 0  MEWS Score 2  MEWS Score Color Yellow  Assess: if the MEWS score is Yellow or Red  Were vital signs taken at a resting state? Yes (but pt is in severe pain)  Focused Assessment No change from prior assessment  Does the patient meet 2 or more of the SIRS criteria? No  Does the patient have a confirmed or suspected source of infection? No  MEWS guidelines implemented  Yes, yellow  Treat  MEWS Interventions Considered administering scheduled or prn medications/treatments as ordered  Take Vital Signs  Increase Vital Sign Frequency  Yellow: Q2hr x1, continue Q4hrs until patient remains green for 12hrs  Escalate  MEWS: Escalate Yellow: Discuss with charge nurse and consider notifying provider and/or RRT  Notify: Charge Nurse/RN  Name of Charge Nurse/RN Notified Chloe, RN  Assess: SIRS CRITERIA  SIRS Temperature  0  SIRS Pulse 1  SIRS Respirations  0  SIRS WBC 0  SIRS Score Sum  1   Yellow MEWS (d/t elevated pulse) started. However pt is in severe pain. PRN Dilaudid given. Pt also has low grade fever (100.4 F). No PRN Tylenol available. Set the room lowest temp, took all the blankets from pt, and placed two ice packs armpits. Will recheck vital in an hour. CN made aware.

## 2022-06-20 NOTE — Plan of Care (Signed)
  Problem: Education: Goal: Knowledge of the prescribed therapeutic regimen will improve Outcome: Not Progressing   Problem: Bowel/Gastric: Goal: Gastrointestinal status for postoperative course will improve Outcome: Not Progressing

## 2022-06-20 NOTE — Progress Notes (Addendum)
Temp backed up from 99.5 to 100.81F despite applying ice packs in armpits and groins, and setting up lowest room temp. Pulse ranged from 120-132 when resting and elevated up to 157 when getting up for voiding. Notified urologist and waiting for a call.  Update @0230 : temp even elevated to 100.8 F. Still waiting for callback

## 2022-06-21 ENCOUNTER — Inpatient Hospital Stay (HOSPITAL_COMMUNITY): Payer: 59

## 2022-06-21 LAB — BASIC METABOLIC PANEL
Anion gap: 8 (ref 5–15)
BUN: 30 mg/dL — ABNORMAL HIGH (ref 6–20)
CO2: 26 mmol/L (ref 22–32)
Calcium: 8.5 mg/dL — ABNORMAL LOW (ref 8.9–10.3)
Chloride: 97 mmol/L — ABNORMAL LOW (ref 98–111)
Creatinine, Ser: 2.3 mg/dL — ABNORMAL HIGH (ref 0.61–1.24)
GFR, Estimated: 35 mL/min — ABNORMAL LOW (ref >60)
Glucose, Bld: 121 mg/dL — ABNORMAL HIGH (ref 70–99)
Potassium: 4.2 mmol/L (ref 3.5–5.1)
Sodium: 131 mmol/L — ABNORMAL LOW (ref 135–145)

## 2022-06-21 LAB — CBC
HCT: 26.5 % — ABNORMAL LOW (ref 39.0–52.0)
Hemoglobin: 8.2 g/dL — ABNORMAL LOW (ref 13.0–17.0)
MCH: 26.5 pg (ref 26.0–34.0)
MCHC: 30.9 g/dL (ref 30.0–36.0)
MCV: 85.5 fL (ref 80.0–100.0)
Platelets: 219 10*3/uL (ref 150–400)
RBC: 3.1 MIL/uL — ABNORMAL LOW (ref 4.22–5.81)
RDW: 15.9 % — ABNORMAL HIGH (ref 11.5–15.5)
WBC: 10.6 10*3/uL — ABNORMAL HIGH (ref 4.0–10.5)
nRBC: 0 % (ref 0.0–0.2)

## 2022-06-21 LAB — PROTIME-INR
INR: 1.1 (ref 0.8–1.2)
Prothrombin Time: 14.6 seconds (ref 11.4–15.2)

## 2022-06-21 LAB — LACTIC ACID, PLASMA
Lactic Acid, Venous: 1.1 mmol/L (ref 0.5–1.9)
Lactic Acid, Venous: 0.8 mmol/L (ref 0.5–1.9)

## 2022-06-21 MED ORDER — IOHEXOL 9 MG/ML PO SOLN
ORAL | Status: AC
  Filled 2022-06-21: qty 1000

## 2022-06-21 MED ORDER — PIPERACILLIN-TAZOBACTAM 3.375 G IVPB
3.3750 g | Freq: Three times a day (TID) | INTRAVENOUS | Status: DC
  Administered 2022-06-21 – 2022-06-25 (×12): 3.375 g via INTRAVENOUS
  Filled 2022-06-21 (×13): qty 50

## 2022-06-21 MED ORDER — ACETAMINOPHEN 500 MG PO TABS
1000.0000 mg | ORAL_TABLET | Freq: Three times a day (TID) | ORAL | Status: DC
  Administered 2022-06-21 – 2022-06-25 (×12): 1000 mg via ORAL
  Filled 2022-06-21 (×12): qty 2

## 2022-06-21 MED ORDER — ACETAMINOPHEN 500 MG PO TABS
1000.0000 mg | ORAL_TABLET | Freq: Three times a day (TID) | ORAL | Status: DC
  Filled 2022-06-21: qty 2

## 2022-06-21 MED ORDER — SODIUM CHLORIDE 0.9 % IV SOLN: INTRAVENOUS | Status: DC

## 2022-06-21 MED ORDER — PIPERACILLIN-TAZOBACTAM 3.375 G IVPB 30 MIN
3.3750 g | Freq: Once | INTRAVENOUS | Status: AC
  Administered 2022-06-21: 3.375 g via INTRAVENOUS
  Filled 2022-06-21 (×2): qty 50

## 2022-06-21 MED ORDER — ENOXAPARIN SODIUM 80 MG/0.8ML IJ SOSY
70.0000 mg | PREFILLED_SYRINGE | Freq: Two times a day (BID) | INTRAMUSCULAR | Status: DC
  Filled 2022-06-21: qty 0.8

## 2022-06-21 MED ORDER — SODIUM CHLORIDE 0.9 % IV SOLN
2.0000 g | INTRAVENOUS | Status: DC
  Administered 2022-06-21: 2 g via INTRAVENOUS
  Filled 2022-06-21: qty 20

## 2022-06-21 MED ORDER — IOHEXOL 9 MG/ML PO SOLN
500.0000 mL | ORAL | Status: AC
  Administered 2022-06-21 (×2): 500 mL via ORAL

## 2022-06-21 MED ORDER — ENOXAPARIN SODIUM 150 MG/ML IJ SOSY
1.0000 mg/kg | PREFILLED_SYRINGE | Freq: Two times a day (BID) | INTRAMUSCULAR | Status: DC
  Administered 2022-06-21 – 2022-06-23 (×5): 138 mg via SUBCUTANEOUS
  Filled 2022-06-21 (×6): qty 0.92

## 2022-06-21 MED ORDER — SODIUM CHLORIDE 0.9 % IV BOLUS
1000.0000 mL | Freq: Once | INTRAVENOUS | Status: AC
  Administered 2022-06-21: 1000 mL via INTRAVENOUS

## 2022-06-21 MED ORDER — ACETAMINOPHEN 10 MG/ML IV SOLN
1000.0000 mg | Freq: Four times a day (QID) | INTRAVENOUS | Status: DC
  Administered 2022-06-21 (×2): 1000 mg via INTRAVENOUS
  Filled 2022-06-21 (×2): qty 100

## 2022-06-21 NOTE — Progress Notes (Signed)
Taking over care of pt; agree with previous RN assessment.

## 2022-06-21 NOTE — Progress Notes (Signed)
3 Days Post-Op Subjective: NAEO. Febrile 102.4. Tachy to 120s. Sepsis work-up initiated.   Objective: Vital signs in last 24 hours: Temp:  [99.2 F (37.3 C)-102.4 F (39.1 C)] 102.4 F (39.1 C) (06/17 0559) Pulse Rate:  [100-146] 126 (06/17 0559) Resp:  [19-36] 20 (06/17 0559) BP: (98-151)/(53-125) 98/53 (06/17 0559) SpO2:  [95 %-100 %] 96 % (06/17 0559)  Intake/Output from previous day: 06/16 0701 - 06/17 0700 In: 2801.5 [P.O.:600; I.V.:2201.5] Out: 1845 [Urine:1675; Drains:170] Intake/Output this shift: No intake/output data recorded.  Physical Exam:  General: Alert and oriented CV: RRR Lungs: Clear Abdomen: Soft, ND Incisions:c/d/I. JP with s/s output GU: voiding spontaneously Ext: NT, No erythema  Lab Results: Recent Labs    06/20/22 0435 06/20/22 1955 06/21/22 0354  HGB 9.5* 8.5* 8.2*  HCT 30.0* 26.8* 26.5*    BMET Recent Labs    06/20/22 0435 06/21/22 0354  NA 132* 131*  K 4.4 4.2  CL 99 97*  CO2 23 26  GLUCOSE 123* 121*  BUN 23* 30*  CREATININE 1.97* 2.30*  CALCIUM 8.7* 8.5*      Studies/Results: DG CHEST PORT 1 VIEW  Result Date: 06/21/2022 CLINICAL DATA:  Status post robotic assisted laparoscopic right nephrectomy for carcinoma 3 days ago. Now with fever. [id number]. EXAM: PORTABLE CHEST 1 VIEW COMPARISON:  CTA chest 05/10/2022. FINDINGS: 6:56 a.m. There is extensive right chest wall emphysema extending into the supraclavicular fossa and right neck. There is no visible pneumothorax. There is a moderate pneumomediastinum also noted. This may have resulted from free intraperitoneal air as there is a crescent of free air underneath the right hemidiaphragm. The free air is most likely residual from the surgery, less likely but not strictly excluded perforated hollow viscus. The lungs expiratory with bibasilar atelectatic bands. The aerated lungs are otherwise clear. The cardiac size, mediastinal silhouette and vasculature are normal. No displaced rib  fracture is seen. IMPRESSION: 1. Extensive right chest wall emphysema extending into the supraclavicular fossa and right neck. 2. Moderate pneumomediastinum. 3. Crescent of free air underneath the right hemidiaphragm, most likely residual from the surgery, less likely but not strictly excluded perforated hollow viscus. 4. Expiratory lungs with bibasilar atelectasis. Electronically Signed   By: Almira Bar M.D.   On: 06/21/2022 07:09    Assessment/Plan: #Right renal mass with tumor thrombus - Regular diet, medlocked - OOB to chair, light ambulation, IS - Multimodal analgesia - Continue JP drain to bulb suction - Increase LVX to treatment dose  #Tachycardia, post-op fever - Bcx, Ucx - CXR - CT C/A/P with oral contrast - IV zosyn per pharmacy consult - 1L NS bolus  #Hyperkalemia - Resolved   LOS: 3 days   Marcus Burns 06/21/2022, 7:36 AM

## 2022-06-21 NOTE — Significant Event (Addendum)
Rapid Response Event Note   Reason for Call :  Elevated temperature, HR, and BP.   Initial Focused Assessment:  Patient alert and oriented, complaining of pain 6/10. Urology paged by primary nurse. Awaiting new order. Abdomin soft, tender, no change from previous assessment per primary nurse. Metoprolol held on dayshift due to low BP. (See MAR and corresponding vitals). IV tylenol discontinued after 0400 6/17, not alternative tylenol available.    Temp- 99.8 oral BP- 151/125 (134) HR-146 RR-36  2L  [place]  Interventions:  Dilaudid for pain management. Urology paged for further orders regarding elevated temperature and HR.   Plan of Care:  Continue to monitor and await Urology for further instruction.    Event Summary:   MD Notified: Urology On-call Call Time: 0500 Arrival Time: 0510 End Time: 0525   Called 0600 for worsening temperature of 102.4 oral. Urology paged third time waiting for further orders.     Odette Horns, RN

## 2022-06-21 NOTE — Progress Notes (Addendum)
Pharmacy Antibiotic Note  Marcus Burns is a 42 y.o. male admitted on 06/18/2022 with renal mass.  Now with sepsis.  Pharmacy has been consulted for Piperacillin-Tazobactam (Zosyn) dosing.  Plan: - Zosyn 3.375g IV q8h (4 hour infusion). - Follow up renal function, culture results, and clinical course.  Height: 6\' 4"  (193 cm) Weight: (!) 139.4 kg (307 lb 5.1 oz) IBW/kg (Calculated) : 86.8  Temp (24hrs), Avg:100.2 F (37.9 C), Min:99.2 F (37.3 C), Max:102.4 F (39.1 C)  Recent Labs  Lab 06/18/22 0850 06/19/22 0308 06/20/22 0435 06/21/22 0354  WBC 5.2  --  9.8 10.6*  CREATININE 1.49* 1.81* 1.97* 2.30*    Estimated Creatinine Clearance: 63.1 mL/min (A) (by C-G formula based on SCr of 2.3 mg/dL (H)).    Allergies  Allergen Reactions   Amlodipine     Edema ( 5 mg)   Doxycycline Other (See Comments)    Light headed , passed out    Antimicrobials this admission: 6/14 Cefazolin x 1 dose 6/17 Ceftriaxone x 1 dose 6/17 Zosyn >>    Microbiology results: 6/17 BCx:  sent 6/17 UCx:  sent 6/14 MRSA PCR:  negative  Thank you for allowing pharmacy to be a part of this patient's care.  Eean Buss Tylene Fantasia 06/21/2022 7:56 AM

## 2022-06-21 NOTE — Progress Notes (Addendum)
ANTICOAGULATION CONSULT NOTE - Initial Consult  Pharmacy Consult for Enoxaparin Indication: VTE Treatment  Allergies  Allergen Reactions   Amlodipine     Edema ( 5 mg)   Doxycycline Other (See Comments)    Light headed , passed out    Patient Measurements: Height: 6\' 4"  (193 cm) Weight: (!) 139.4 kg (307 lb 5.1 oz) IBW/kg (Calculated) : 86.8   Vital Signs: Temp: 98.7 F (37.1 C) (06/17 0816) Temp Source: Oral (06/17 0816) BP: 108/70 (06/17 0816) Pulse Rate: 117 (06/17 0816)  Labs: Recent Labs    06/18/22 0850 06/18/22 1709 06/19/22 0308 06/20/22 0435 06/20/22 1955 06/21/22 0354  HGB 12.5*   < > 10.3* 9.5* 8.5* 8.2*  HCT 40.7   < > 33.8* 30.0* 26.8* 26.5*  PLT 295  --   --  235  --  219  CREATININE 1.49*  --  1.81* 1.97*  --  2.30*   < > = values in this interval not displayed.    Estimated Creatinine Clearance: 63.1 mL/min (A) (by C-G formula based on SCr of 2.3 mg/dL (H)).   Medical History: Past Medical History:  Diagnosis Date   Acute prostatitis 06/19/2007   ALLERGIC RHINITIS 06/19/2007   ANXIETY 06/19/2007   Cancer (HCC)    kidney   ELEVATED BLOOD PRESSURE WITHOUT DIAGNOSIS OF HYPERTENSION 06/19/2007   GERD 06/19/2007   GERD (gastroesophageal reflux disease)    History of COVID-19 01/24/2019   History of kidney stones    Hypertension    Inguinal hernia    Migraines    NEPHROLITHIASIS, HX OF 06/19/2007   PE (pulmonary thromboembolism) (HCC)     Medications:  - PTA Eliquis 5mg  bid (Last dose 6/10) - PTA on 6/11 started Lovenox 120mg  sq q12h in preparation for surgery (Last dose 6/13) - 6/14 to 6/15: No anticoagulation  - 6/16: Prophylactic dose Lovenox started - 6/17: Treatment dose Lovenox started   Assessment: Marcus Burns is a 42 y.o. male admitted on 6/14 with right renal mass.  On 6/14 patient underwent right robotic radical nephrectomy with retroperitoneal lymph node dissection.  Post op diagnosis: Right renal mass with tumor  thrombus.  Of note, he is on Eliquis PTA for history of PE.  Prior to surgery, Eliquis was held and patient was placed on Lovenox PTA in preparation for surgery.  Pharmacy is consulted to dose enoxaparin for VTE Treatment.   Today, 06/21/22  - Hgb = 8.2, low trending down - Plt = 219 WNL  Goal of Therapy:  Anti-Xa level 0.6-1 units/ml 4hrs after LMWH dose given Monitor platelets by anticoagulation protocol: Yes   Plan:  - Lovenox 1mg /kg (138mg ) SQ every 12 hours - CBC every 72 hours and as needed  - Monitor H&H and platelets - Monitor for signs/symptoms of bleeding  Jeremias Broyhill W Adetokunbo Mccadden 06/21/2022,8:21 AM

## 2022-06-21 NOTE — Progress Notes (Addendum)
   06/21/22 0559  Vitals  Temp (!) 102.4 F (39.1 C)  Temp Source Oral  BP (!) 98/53  MAP (mmHg) 67  BP Location Left Arm  BP Method Automatic  Patient Position (if appropriate) Lying  Pulse Rate (!) 126  Pulse Rate Source Monitor  Resp 20  MEWS COLOR  MEWS Score Color Red  Oxygen Therapy  SpO2 96 %  O2 Device Nasal Cannula  MEWS Score  MEWS Temp 2  MEWS Systolic 1  MEWS Pulse 2  MEWS RR 0  MEWS LOC 0  MEWS Score 5   RN is still waiting a callback from a urologist. Rapid and CN made aware.

## 2022-06-21 NOTE — TOC CM/SW Note (Signed)
Transition of Care [facility]) - Inpatient Brief Assessment  Patient Details  Name: EUELL SANTORELLI MRN: [id number] Date of Birth: 12-05-1980  Transition of Care [facility]) CM/SW Contact:    Ewing Schlein, LCSW Phone Number: 06/21/2022, 3:24 PM  Clinical Narrative: Screening completed. No TOC needs identified at this time.  Transition of Care Asessment: Insurance and Status: Insurance coverage has been reviewed Patient has primary care physician: Yes Home environment has been reviewed: Resides at home with spouse Prior level of function:: Independent at baseline Prior/Current Home Services: No current home services Social Determinants of Health Reivew: SDOH reviewed no interventions necessary Readmission risk has been reviewed: Yes Transition of care needs: no transition of care needs at this time

## 2022-06-22 LAB — BPAM RBC
Blood Product Expiration Date: 202407102359
Blood Product Expiration Date: 202407102359
ISSUE DATE / TIME: 202406141533
Unit Type and Rh: 6200
Unit Type and Rh: 6200
Unit Type and Rh: 6200

## 2022-06-22 LAB — TYPE AND SCREEN
ABO/RH(D): A POS
Unit division: 0

## 2022-06-22 LAB — BASIC METABOLIC PANEL
Anion gap: 10 (ref 5–15)
BUN: 30 mg/dL — ABNORMAL HIGH (ref 6–20)
CO2: 20 mmol/L — ABNORMAL LOW (ref 22–32)
Calcium: 8.2 mg/dL — ABNORMAL LOW (ref 8.9–10.3)
Chloride: 104 mmol/L (ref 98–111)
Creatinine, Ser: 2.09 mg/dL — ABNORMAL HIGH (ref 0.61–1.24)
GFR, Estimated: 40 mL/min — ABNORMAL LOW (ref >60)
Glucose, Bld: 104 mg/dL — ABNORMAL HIGH (ref 70–99)
Potassium: 4.2 mmol/L (ref 3.5–5.1)
Sodium: 134 mmol/L — ABNORMAL LOW (ref 135–145)

## 2022-06-22 LAB — PREPARE RBC (CROSSMATCH)

## 2022-06-22 LAB — CBC
HCT: 22.9 % — ABNORMAL LOW (ref 39.0–52.0)
Hemoglobin: 7.1 g/dL — ABNORMAL LOW (ref 13.0–17.0)
MCH: 26.8 pg (ref 26.0–34.0)
MCHC: 31 g/dL (ref 30.0–36.0)
MCV: 86.4 fL (ref 80.0–100.0)
Platelets: 214 10*3/uL (ref 150–400)
RBC: 2.65 MIL/uL — ABNORMAL LOW (ref 4.22–5.81)
RDW: 16 % — ABNORMAL HIGH (ref 11.5–15.5)
WBC: 9.9 10*3/uL (ref 4.0–10.5)
nRBC: 0 % (ref 0.0–0.2)

## 2022-06-22 LAB — URINE CULTURE

## 2022-06-22 MED ORDER — SODIUM CHLORIDE 0.9% IV SOLUTION: Freq: Once | INTRAVENOUS | Status: AC

## 2022-06-22 NOTE — Progress Notes (Signed)
MEWS Progress Note  Patient Details Name: Marcus Burns MRN: [id number] DOB: 1980/02/08 Today's Date: 06/22/2022   MEWS Flowsheet Documentation:  Assess: MEWS Score Temp: (!) 101.3 F (38.5 C) BP: 122/75 MAP (mmHg): 75 Pulse Rate: (!) 107 ECG Heart Rate: (!) 114 Resp: (!) 22 Level of Consciousness: Alert SpO2: 99 % O2 Device: Room Air Patient Activity (if Appropriate): In bed O2 Flow Rate (L/min): 1 L/min Assess: MEWS Score MEWS Temp: 1 MEWS Systolic: 0 MEWS Pulse: 2 MEWS RR: 1 MEWS LOC: 0 MEWS Score: 4 MEWS Score Color: Red Assess: SIRS CRITERIA SIRS Temperature : 1 SIRS Respirations : 1 SIRS Pulse: 1 SIRS WBC: 0 SIRS Score Sum : 3 SIRS Temperature : 1 SIRS Pulse: 1 SIRS Respirations : 1 SIRS WBC: 0 SIRS Score Sum : 3 Assess: if the MEWS score is Yellow or Red Were vital signs taken at a resting state?: Yes Focused Assessment: No change from prior assessment Does the patient meet 2 or more of the SIRS criteria?: Yes Does the patient have a confirmed or suspected source of infection?: No MEWS guidelines implemented : Yes, red Treat MEWS Interventions: Considered administering scheduled or prn medications/treatments as ordered Take Vital Signs Increase Vital Sign Frequency : Red: Q1hr x2, continue Q4hrs until patient remains green for 12hrs Escalate MEWS: Escalate: Red: Discuss with charge nurse and notify provider. Consider notifying RRT. If remains red for 2 hours consider need for higher level of care [place]id Derry, rapid nurse notified.   Abby, 4th floor charge notified.      Renda Rolls 06/22/2022, 3:50 PM

## 2022-06-22 NOTE — Progress Notes (Addendum)
4 Days Post-Op Subjective: NAEO. Febrile to 101.2, tachy to 130s around midnight. Defervesced, tachycardia improved as of this AM.   Objective: Vital signs in last 24 hours: Temp:  [98 F (36.7 C)-101.2 F (38.4 C)] 99.4 F (37.4 C) (06/18 0445) Pulse Rate:  [92-138] 100 (06/18 0445) Resp:  [19-26] 19 (06/18 0445) BP: (96-125)/(50-94) 113/64 (06/18 0445) SpO2:  [91 %-100 %] 91 % (06/18 0445)  Intake/Output from previous day: 06/17 0701 - 06/18 0700 In: 2119.3 [P.O.:120; I.V.:1619; IV Piggyback:380.3] Out: 2915 [Urine:2775; Drains:140] Intake/Output this shift: No intake/output data recorded.  Physical Exam:  General: Alert and oriented CV: RRR Lungs: Clear Abdomen: Soft, ND Incisions:c/d/I. JP with s/s output GU: voiding spontaneously Ext: NT, No erythema  Lab Results: Recent Labs    06/20/22 1955 06/21/22 0354 06/22/22 0411  HGB 8.5* 8.2* 7.1*  HCT 26.8* 26.5* 22.9*    BMET Recent Labs    06/20/22 0435 06/21/22 0354  NA 132* 131*  K 4.4 4.2  CL 99 97*  CO2 23 26  GLUCOSE 123* 121*  BUN 23* 30*  CREATININE 1.97* 2.30*  CALCIUM 8.7* 8.5*      Studies/Results: CT CHEST ABDOMEN PELVIS WO CONTRAST  Result Date: 06/21/2022 CLINICAL DATA:  Evaluate for occult bowel injury. Postoperative fevers. EXAM: CT CHEST, ABDOMEN AND PELVIS WITHOUT CONTRAST TECHNIQUE: Multidetector CT imaging of the chest, abdomen and pelvis was performed following the standard protocol without IV contrast. RADIATION DOSE REDUCTION: This exam was performed according to the departmental dose-optimization program which includes automated exposure control, adjustment of the mA and/or kV according to patient size and/or use of iterative reconstruction technique. COMPARISON:  CT angio chest from 05/10/2022 and CT AP 02/26/2022 FINDINGS: CT CHEST FINDINGS Cardiovascular: Heart size is normal.  No pericardial effusion. Mediastinum/Nodes: Pneumomediastinum. Thyroid gland, trachea and esophagus are  unremarkable. No enlarged mediastinal or axillary lymph nodes. Lungs/Pleura: Small bilateral pleural effusions. Multiple areas of subsegmental atelectasis noted in the right middle lobe, both lower lobes and lingula. No airspace consolidation or interstitial edema. No significant pneumothorax identified at this time. Musculoskeletal: There is extensive right chest wall emphysema identified anteriorly, laterally and posteriorly. This extends into the visualized portions of the right lower neck. No acute or suspicious osseous findings. CT ABDOMEN PELVIS FINDINGS Hepatobiliary: No focal liver abnormality. Gallstones are noted dependently. Pancreas: Unremarkable. No pancreatic ductal dilatation or surrounding inflammatory changes. Spleen: Normal in size without focal abnormality. Adrenals/Urinary Tract: Normal appearance of the adrenal glands. Status post right nephrectomy. There is a collection gas and amorphous debris within the right renal fossa and along the inferior margin of the right hepatic lobe which measures 6.7 x 4.6 by 5.2 cm, image 60/2 and image 110/4. Percutaneous drainage catheter is identified which enters from a right lateral approach and terminates along the inferior margin of the right hepatic lobe, image 66/2. Multiple small stones are again seen within the left kidney measuring up to 4 mm. No left-sided hydronephrosis. A small amount of gas noted within the non dependent portion of the urinary bladder. Stomach/Bowel: Enteric contrast material it is identified within the dependent portion of the stomach. There is also contrast material identified within the distal small bowel loops and ascending colon the appendix is visualized and appears normal. No extravasation of the enteric contrast material is identified to suggest a site of suspected bowel injury. No pathologic dilatation of the large or small bowel loops. Mild gaseous distension of the colon with air-fluid levels noted along the transverse  colon.  Findings are favored to represent postoperative ileus. Vascular/Lymphatic: Normal appearance of the abdominal aorta. No signs of abdominopelvic adenopathy. Reproductive: Prostate is unremarkable. Other: Pneumoperitoneum is identified. Gas is also seen within the right retroperitoneum. Soft tissue stranding is noted extending along the right pericolic gutter and right retroperitoneal space. No significant free fluid. No focal fluid collections identified to suggest abscess. Musculoskeletal: Extensive subcutaneous gas is identified extending along the ventral abdominal wall, right flank and right posterior abdominal wall. Gas is also noted tracking into bilateral inguinal canals and into the scrotum. There is also gas noted along both sides of the ventral pelvis and extending into bilateral upper thighs. No suspicious bone lesions identified. IMPRESSION: 1. Status post right nephrectomy. 2. Nonspecific postoperative collection of gas and amorphous debris within the right renal fossa and the inferior margin of the right hepatic lobe which measures 6.7 x 4.6 by 5.2 cm. Percutaneous drainage catheter is identified which terminates along the inferior margin of the right hepatic lobe. 3. Extensive subcutaneous gas is identified extending along the ventral abdominal wall, right flank and right posterior abdominal wall. Gas is also noted tracking into bilateral inguinal canals and into the scrotum. There is also gas noted along both sides of the ventral pelvis and extending into bilateral upper thighs. Gas also extends along the right chest wall and into the right lower neck. Pneumomediastinum is also noted. 4. Pneumoperitoneum is identified. No extravasation of enteric contrast material no extravasation of the enteric contrast material is identified to suggest a site of suspected bowel injury. 5. Small bilateral pleural effusions with multiple areas of subsegmental atelectasis in the right middle lobe, both lower  lobes and lingula. 6. Gallstones. 7. Nonobstructing left renal calculi. Critical Value/emergent results were called by telephone at the time of interpretation on 06/21/2022 at 4:58 pm to provider [facility] , who verbally acknowledged these results. Electronically Signed   By: Signa Kell M.D.   On: 06/21/2022 16:59   DG CHEST PORT 1 VIEW  Result Date: 06/21/2022 CLINICAL DATA:  Status post robotic assisted laparoscopic right nephrectomy for carcinoma 3 days ago. Now with fever. [id number]. EXAM: PORTABLE CHEST 1 VIEW COMPARISON:  CTA chest 05/10/2022. FINDINGS: 6:56 a.m. There is extensive right chest wall emphysema extending into the supraclavicular fossa and right neck. There is no visible pneumothorax. There is a moderate pneumomediastinum also noted. This may have resulted from free intraperitoneal air as there is a crescent of free air underneath the right hemidiaphragm. The free air is most likely residual from the surgery, less likely but not strictly excluded perforated hollow viscus. The lungs expiratory with bibasilar atelectatic bands. The aerated lungs are otherwise clear. The cardiac size, mediastinal silhouette and vasculature are normal. No displaced rib fracture is seen. IMPRESSION: 1. Extensive right chest wall emphysema extending into the supraclavicular fossa and right neck. 2. Moderate pneumomediastinum. 3. Crescent of free air underneath the right hemidiaphragm, most likely residual from the surgery, less likely but not strictly excluded perforated hollow viscus. 4. Expiratory lungs with bibasilar atelectasis. Electronically Signed   By: Almira Bar M.D.   On: 06/21/2022 07:09    Assessment/Plan: #Right renal mass with tumor thrombus - Regular diet, medlocked - OOB to chair, light ambulation, IS - Multimodal analgesia - Continue JP drain to bulb suction  #Hx of PE - Treatment dose LVX per pharmacy  #Tachycardia, post-op fever - Bcx NGx24h, Ucx pending - CT C/A/P  without acute intraabdominal process; c/f poss PNA - IV zosyn per  pharmacy consult - Continue to monitor  #Acute blood loss anemia - 1u pRBC - Continue to trend Hgb with daily labs   LOS: 4 days   Carlus Pavlov 06/22/2022, 7:30 AM    S:   1 - Locally Advanced Right Renal Cancer - s/p RIGHT robotic radical nephrectomy with node dissection / partial hepatic resection 06/18/22 for extremely clinically aggressive mass. Path pending.     2- Recent Pulmonary Emboli - PE's early 2024 causing rescheduling of orgininally planned right nephrectomy. Managed with Eliquus with Lovenox bridge pre-op. Proph lovenox restarted POD 1, treatmetn dose POD 3.    3 - Post-OP Fever / Suspect Early Pneumonia - New fevers to 102 POD 3 AM. No localized symptoms. CT chest/abd/pelvis with PO contrast w/o bowel leak or abdominal collections. Some R>L lower lobe airspece disease (I query early PNA), started on empiric Zosyn.    4 - Acute Blood Loss Anemia on Chronic Anemia - baseline anemia Hgb 9-10 with large right renal mass. About ebl intra-op 6/14. Hgb drift to 7.2 6/18 and 1u pRBC given. Ct 6/17 w/o hematomas.   Today "Kehinde" is slowly improving funcitonally. Some walking, fever peaks appear to be trending down. Given 1upRBC earlier for some progressive anemia, CT yesterday w/o large collections and JP output acceptable.    O: Tm101, WBC 9.9, Hgb 7.2 NAD,  Non-labored breathign on RA SNTND, recent port / extraction sites c/d/I No foley No c/c/e   A/P: Continue empiric zosyn, TX dose lovenox, continue daily labs. Goals for DC discussed includign no high fevers x 24 hours reiterated.

## 2022-06-23 LAB — BASIC METABOLIC PANEL
Anion gap: 7 (ref 5–15)
BUN: 21 mg/dL — ABNORMAL HIGH (ref 6–20)
CO2: 22 mmol/L (ref 22–32)
Calcium: 7.8 mg/dL — ABNORMAL LOW (ref 8.9–10.3)
Chloride: 106 mmol/L (ref 98–111)
Creatinine, Ser: 1.72 mg/dL — ABNORMAL HIGH (ref 0.61–1.24)
GFR, Estimated: 50 mL/min — ABNORMAL LOW (ref >60)
Glucose, Bld: 102 mg/dL — ABNORMAL HIGH (ref 70–99)
Potassium: 3.7 mmol/L (ref 3.5–5.1)
Sodium: 135 mmol/L (ref 135–145)

## 2022-06-23 LAB — TYPE AND SCREEN
Antibody Screen: NEGATIVE
Unit division: 0
Unit division: 0

## 2022-06-23 LAB — BPAM RBC
Blood Product Expiration Date: 202407102359
ISSUE DATE / TIME: 202406191051

## 2022-06-23 LAB — CBC
HCT: 25.2 % — ABNORMAL LOW (ref 39.0–52.0)
Hemoglobin: 7.8 g/dL — ABNORMAL LOW (ref 13.0–17.0)
MCH: 26.6 pg (ref 26.0–34.0)
MCHC: 31 g/dL (ref 30.0–36.0)
MCV: 86 fL (ref 80.0–100.0)
Platelets: 245 10*3/uL (ref 150–400)
RBC: 2.93 MIL/uL — ABNORMAL LOW (ref 4.22–5.81)
RDW: 16.5 % — ABNORMAL HIGH (ref 11.5–15.5)
WBC: 8.3 10*3/uL (ref 4.0–10.5)
nRBC: 0 % (ref 0.0–0.2)

## 2022-06-23 LAB — CULTURE, BLOOD (ROUTINE X 2)
Special Requests: ADEQUATE
Special Requests: ADEQUATE

## 2022-06-23 LAB — SURGICAL PATHOLOGY

## 2022-06-23 LAB — PREPARE RBC (CROSSMATCH)

## 2022-06-23 MED ORDER — ENOXAPARIN SODIUM 150 MG/ML IJ SOSY
140.0000 mg | PREFILLED_SYRINGE | Freq: Two times a day (BID) | INTRAMUSCULAR | Status: DC
  Administered 2022-06-23 – 2022-06-25 (×4): 140 mg via SUBCUTANEOUS
  Filled 2022-06-23 (×4): qty 0.94

## 2022-06-23 MED ORDER — SODIUM CHLORIDE 0.9% IV SOLUTION: Freq: Once | INTRAVENOUS | Status: AC

## 2022-06-23 NOTE — Plan of Care (Signed)
  Problem: Clinical Measurements: Goal: Postoperative complications will be avoided or minimized Outcome: Progressing   Problem: Respiratory: Goal: Ability to achieve and maintain a regular respiratory rate will improve Outcome: Progressing   Problem: Urinary Elimination: Goal: Ability to achieve and maintain urine output will improve Outcome: Progressing   Problem: Education: Goal: Knowledge of General Education information will improve Description: Including pain rating scale, medication(s)/side effects and non-pharmacologic comfort measures Outcome: Progressing   Problem: Health Behavior/Discharge Planning: Goal: Ability to manage health-related needs will improve Outcome: Progressing   Problem: Clinical Measurements: Goal: Will remain free from infection Outcome: Progressing Goal: Diagnostic test results will improve Outcome: Progressing Goal: Respiratory complications will improve Outcome: Progressing   Problem: Nutrition: Goal: Adequate nutrition will be maintained Outcome: Progressing   Problem: Coping: Goal: Level of anxiety will decrease Outcome: Progressing   Problem: Pain Managment: Goal: General experience of comfort will improve Outcome: Progressing   Problem: Safety: Goal: Ability to remain free from injury will improve Outcome: Progressing

## 2022-06-23 NOTE — Progress Notes (Signed)
5 Days Post-Op Subjective: NAEO. S/p 1u pRBC yesterday, Hgb up to 7.8. Felt better this am overall. Sitting in chair early in the AM.  Objective: Vital signs in last 24 hours: Temp:  [98.1 F (36.7 C)-100.8 F (38.2 C)] 98.1 F (36.7 C) (06/19 1353) Pulse Rate:  [88-117] 88 (06/19 1353) Resp:  [13-21] 20 (06/19 1353) BP: (110-131)/(67-83) 118/83 (06/19 1353) SpO2:  [96 %-100 %] 100 % (06/19 1353)  Intake/Output from previous day: 06/18 0701 - 06/19 0700 In: 1801.7 [P.O.:580; I.V.:861.7; Blood:360] Out: 2255 [Urine:2200; Drains:55] Intake/Output this shift: Total I/O In: 1981.7 [I.V.:1438; Blood:324; IV Piggyback:219.6] Out: 250 [Urine:250]  Physical Exam:  General: Alert and oriented CV: RRR Lungs: Clear Abdomen: Soft, ND Incisions:c/d/I. JP with s/s output GU: voiding spontaneously Ext: NT, No erythema  Lab Results: Recent Labs    06/21/22 0354 06/22/22 0411 06/23/22 0436  HGB 8.2* 7.1* 7.8*  HCT 26.5* 22.9* 25.2*    BMET Recent Labs    06/22/22 0751 06/23/22 0436  NA 134* 135  K 4.2 3.7  CL 104 106  CO2 20* 22  GLUCOSE 104* 102*  BUN 30* 21*  CREATININE 2.09* 1.72*  CALCIUM 8.2* 7.8*      Studies/Results: No results found.  Assessment/Plan: #Right renal mass with tumor thrombus - Regular diet, medlocked - OOB to chair, light ambulation, IS - Multimodal analgesia - Continue JP drain to bulb suction  #Hx of PE - Treatment dose LVX per pharmacy - Monitoring Hgb with daily labs  #Tachycardia, post-op fever - Bcx NGx48h, Ucx with multiple species - CT C/A/P without acute intraabdominal process; c/f poss PNA - IV zosyn per pharmacy consult - Continue to monitor  #Acute blood loss anemia - s/p 1u pRBC; Hgb 7.8 - 1u pRBC today - Continue to trend Hgb with daily labs   LOS: 5 days   Carlus Pavlov 06/23/2022, 3:53 PM

## 2022-06-24 LAB — CBC
HCT: 28.6 % — ABNORMAL LOW (ref 39.0–52.0)
Hemoglobin: 8.8 g/dL — ABNORMAL LOW (ref 13.0–17.0)
MCH: 26.7 pg (ref 26.0–34.0)
MCHC: 30.8 g/dL (ref 30.0–36.0)
MCV: 86.7 fL (ref 80.0–100.0)
Platelets: 301 10*3/uL (ref 150–400)
RBC: 3.3 MIL/uL — ABNORMAL LOW (ref 4.22–5.81)
RDW: 16.9 % — ABNORMAL HIGH (ref 11.5–15.5)
WBC: 8.1 10*3/uL (ref 4.0–10.5)
nRBC: 0 % (ref 0.0–0.2)

## 2022-06-24 LAB — CULTURE, BLOOD (ROUTINE X 2)
Culture: NO GROWTH
Culture: NO GROWTH

## 2022-06-24 LAB — BPAM RBC
Blood Product Expiration Date: 202406202359
ISSUE DATE / TIME: 202406181158
Unit Type and Rh: 6200

## 2022-06-24 LAB — TYPE AND SCREEN: ABO/RH(D): A POS

## 2022-06-24 NOTE — Progress Notes (Signed)
BRIEF PHARMACY ANTIBIOTIC NOTE -   Pharmacy has been assisting with dosing of Piperacillin-Tazobactam (Zosyn) for sepsis.  Dosage remains stable: Zosyn 3.375g IV q8h (4 hour infusion). Need for further dosage adjustment appears unlikely at present.    Pharmacy will sign off at this time, following peripherally for culture results, dose adjustments, and length of therapy via external software.  Please reconsult if a change in clinical status warrants re-evaluation of dosage.  Tacy Learn, PharmD Clinical Pharmacist 06/24/2022 8:23 AM

## 2022-06-24 NOTE — Progress Notes (Signed)
6 Days Post-Op Subjective: NAEO. S/p 1u pRBC yesterday. Hgb recheck pending. Ambulating, Bmx3 yesterday. Overall feeling better.  Objective: Vital signs in last 24 hours: Temp:  [98.1 F (36.7 C)-99.4 F (37.4 C)] 99.2 F (37.3 C) (06/20 0350) Pulse Rate:  [88-100] 90 (06/20 0350) Resp:  [18-20] 20 (06/20 0350) BP: (118-137)/(79-87) 125/87 (06/20 0350) SpO2:  [99 %-100 %] 99 % (06/20 0350)  Intake/Output from previous day: 06/19 0701 - 06/20 0700 In: 1981.7 [I.V.:1438; Blood:324; IV Piggyback:219.6] Out: 890 [Urine:800; Drains:90] Intake/Output this shift: Total I/O In: -  Out: 660 [Urine:600; Drains:60]  Physical Exam:  General: Alert and oriented CV: RRR Lungs: Clear Abdomen: Soft, ND Incisions:c/d/I. JP with s/s output GU: voiding spontaneously Ext: NT, No erythema  Lab Results: Recent Labs    06/22/22 0411 06/23/22 0436  HGB 7.1* 7.8*  HCT 22.9* 25.2*    BMET Recent Labs    06/22/22 0751 06/23/22 0436  NA 134* 135  K 4.2 3.7  CL 104 106  CO2 20* 22  GLUCOSE 104* 102*  BUN 30* 21*  CREATININE 2.09* 1.72*  CALCIUM 8.2* 7.8*      Studies/Results: No results found.  Assessment/Plan: #Right renal mass with tumor thrombus - Regular diet, medlocked - OOB to chair, light ambulation, IS - Multimodal analgesia - Continue JP drain to bulb suction  #Hx of PE - Treatment dose LVX per pharmacy - Monitoring Hgb with daily labs  #Tachycardia, post-op fever - Bcx NGx72h, Ucx with multiple species - CT C/A/P without acute intraabdominal process; c/f poss PNA - IV zosyn per pharmacy consult - Continue to monitor  #Acute blood loss anemia - s/p 2u pRBC; Hgb recheck pending - Continue to trend Hgb with daily labs   LOS: 6 days   Carlus Pavlov 06/24/2022, 12:28 PM

## 2022-06-24 NOTE — Plan of Care (Signed)
  Problem: Skin Integrity: Goal: Demonstration of wound healing without infection will improve Outcome: Progressing   Problem: Education: Goal: Knowledge of the prescribed therapeutic regimen will improve Outcome: Adequate for Discharge   Problem: Bowel/Gastric: Goal: Gastrointestinal status for postoperative course will improve Outcome: Adequate for Discharge   Problem: Clinical Measurements: Goal: Postoperative complications will be avoided or minimized Outcome: Adequate for Discharge   Problem: Respiratory: Goal: Ability to achieve and maintain a regular respiratory rate will improve Outcome: Adequate for Discharge   Problem: Urinary Elimination: Goal: Ability to avoid or minimize complications of infection will improve Outcome: Adequate for Discharge Goal: Ability to achieve and maintain urine output will improve Outcome: Adequate for Discharge

## 2022-06-24 NOTE — Progress Notes (Addendum)
ANTICOAGULATION CONSULT NOTE - follow up  Pharmacy Consult for Enoxaparin Indication: VTE Treatment  Allergies  Allergen Reactions   Amlodipine     Edema ( 5 mg)   Doxycycline Other (See Comments)    Light headed , passed out    Patient Measurements: Height: 6\' 4"  (193 cm) Weight: (!) 139.4 kg (307 lb 5.1 oz) IBW/kg (Calculated) : 86.8   Vital Signs: Temp: 99.2 F (37.3 C) (06/20 0350) Temp Source: Oral (06/20 0350) BP: 125/87 (06/20 0350) Pulse Rate: 90 (06/20 0350)  Labs: Recent Labs    06/21/22 0930 06/22/22 0411 06/22/22 0751 06/23/22 0436  HGB  --  7.1*  --  7.8*  HCT  --  22.9*  --  25.2*  PLT  --  214  --  245  LABPROT 14.6  --   --   --   INR 1.1  --   --   --   CREATININE  --   --  2.09* 1.72*     Estimated Creatinine Clearance: 84.4 mL/min (A) (by C-G formula based on SCr of 1.72 mg/dL (H)).   Medical History: Past Medical History:  Diagnosis Date   Acute prostatitis 06/19/2007   ALLERGIC RHINITIS 06/19/2007   ANXIETY 06/19/2007   Cancer (HCC)    kidney   ELEVATED BLOOD PRESSURE WITHOUT DIAGNOSIS OF HYPERTENSION 06/19/2007   GERD 06/19/2007   GERD (gastroesophageal reflux disease)    History of COVID-19 01/24/2019   History of kidney stones    Hypertension    Inguinal hernia    Migraines    NEPHROLITHIASIS, HX OF 06/19/2007   PE (pulmonary thromboembolism) (HCC)     Medications:  - PTA Eliquis 5mg  bid (Last dose 6/10) - history of PE - PTA on 6/11 started Lovenox 120mg  sq q12h in preparation for surgery (Last dose 6/13) - 6/14 to 6/15: No anticoagulation  - 6/16: Prophylactic dose Lovenox started - 6/17: Treatment dose Lovenox started   Assessment: Marcus Burns is a 42 y.o. male admitted on 6/14 with right renal mass.  On 6/14 patient underwent right robotic radical nephrectomy with retroperitoneal lymph node dissection.  Post op diagnosis: Right renal mass with tumor thrombus.  Of note, he is on Eliquis PTA for history of PE.   Prior to surgery, Eliquis was held and patient was placed on Lovenox PTA in preparation for surgery.  Pharmacy is consulted to dose enoxaparin for VTE Treatment.   - 6/18:  Hgb= 7.1 g/dL; Plt= 409 K/uL  >> 1 unit PRBC - On 6/18 spoke with Dr. Carlus Pavlov:  He wanted to continue Lovenox, even though Hgb low and s/p transfusion.  He felt benefit outweighs risk.  Monitor Hgb.    - 6/19:  Hgb = 7.8 g/dL; Plt= 811 K/uL >> 1 unit PRBC  - Renal function stable, but remains above baseline. (6/19 Scr = 1.72)  Today, 06/24/22  -  No labs today   Goal of Therapy:  Anti-Xa level 0.6-1 units/ml 4hrs after LMWH dose given Monitor platelets by anticoagulation protocol: Yes   Plan:  - Lovenox 1mg /kg (140mg ) SQ every 12 hours - Will order CBC tomorrow; monitor CBC daily  - Monitor H&H and platelets - Monitor for signs/symptoms of bleeding  Marcus Burns 06/24/2022,7:47 AM

## 2022-06-24 NOTE — Plan of Care (Signed)
  Problem: Urinary Elimination: Goal: Ability to avoid or minimize complications of infection will improve Outcome: Progressing Goal: Ability to achieve and maintain urine output will improve Outcome: Progressing   Problem: Education: Goal: Knowledge of General Education information will improve Description: Including pain rating scale, medication(s)/side effects and non-pharmacologic comfort measures Outcome: Progressing   Problem: Health Behavior/Discharge Planning: Goal: Ability to manage health-related needs will improve Outcome: Progressing   Problem: Clinical Measurements: Goal: Ability to maintain clinical measurements within normal limits will improve Outcome: Progressing Goal: Will remain free from infection Outcome: Progressing Goal: Respiratory complications will improve Outcome: Progressing Goal: Cardiovascular complication will be avoided Outcome: Progressing   Problem: Activity: Goal: Risk for activity intolerance will decrease Outcome: Progressing   Problem: Nutrition: Goal: Adequate nutrition will be maintained Outcome: Progressing   Problem: Coping: Goal: Level of anxiety will decrease Outcome: Progressing   Problem: Elimination: Goal: Will not experience complications related to bowel motility Outcome: Progressing Goal: Will not experience complications related to urinary retention Outcome: Progressing   Problem: Pain Managment: Goal: General experience of comfort will improve Outcome: Progressing

## 2022-06-25 LAB — CBC
HCT: 28.1 % — ABNORMAL LOW (ref 39.0–52.0)
Hemoglobin: 8.6 g/dL — ABNORMAL LOW (ref 13.0–17.0)
MCH: 27 pg (ref 26.0–34.0)
MCHC: 30.6 g/dL (ref 30.0–36.0)
MCV: 88.4 fL (ref 80.0–100.0)
Platelets: 260 10*3/uL (ref 150–400)
RBC: 3.18 MIL/uL — ABNORMAL LOW (ref 4.22–5.81)
RDW: 16.8 % — ABNORMAL HIGH (ref 11.5–15.5)
WBC: 8.3 10*3/uL (ref 4.0–10.5)
nRBC: 0 % (ref 0.0–0.2)

## 2022-06-25 MED ORDER — CEPHALEXIN 500 MG PO CAPS: 500.0000 mg | ORAL_CAPSULE | Freq: Three times a day (TID) | ORAL | 0 refills | Status: AC

## 2022-06-25 NOTE — Discharge Summary (Signed)
Physician Discharge Summary  Patient ID: Marcus Burns MRN: [id number] DOB/AGE: May 26, 1980 42 y.o.  Admit date: 06/18/2022 Discharge date: 06/25/2022  Admission Diagnoses: Large Right Renal Mass, Recent Pulmonary Emboli  Discharge Diagnoses:  Principal Problem:   Renal mass Pnuemonia Acute Blood Loss Anemia  Discharged Condition: fair  Hospital Course:   1 - Locally Advanced Right Renal Cancer - s/p RIGHT robotic radical nephrectomy with node dissection / partial hepatic resection 06/18/22 for extremely clinically aggressive mass. Path [id number] clear cell cancer with ipsilateral adrenal and renal vein invasion.    2- Recent Pulmonary Emboli - PE's early 2024 causing rescheduling of orgininally planned right nephrectomy. Managed with Eliquus with Lovenox bridge pre-op. Proph lovenox restarted POD 1, treatmetn dose POD 3. Eliquus to resume at discharge.    3 - Post-OP Fever / Suspect Early Pneumonia - New fevers to 102 POD 3 AM. No localized symptoms. CT chest/abd/pelvis with PO contrast w/o bowel leak or abdominal collections. Some R>L lower lobe airspece disease (I query early PNA), started on empiric Zosyn. Afebrile x 24 hours by time of discharge.     4 - Acute Blood Loss Anemia on Chronic Anemia - baseline anemia Hgb 9-10 with large right renal mass. About ebl intra-op 6/14. Hgb drift to 7.2 6/18 and 1u pRBC given x2. Ct 6/17 w/o hematomas. Discharge Hgb 8.6.   By the afternoon of 6/21, the day of discharge, he is ambulatory, pain controlled on PO meds, afebrile x 24 hours, and felt to be adequate for discharge.   Consults: None  Significant Diagnostic Studies: labs: as per above  Treatments: surgery, IV ABX, Transfusion  Discharge Exam: Blood pressure 127/79, pulse 85, temperature 98.6 F (37 C), temperature source Oral, resp. rate 20, height 6\' 4"  (1.93 m), weight (!) 139.4 kg, SpO2 99 %.  NAD, wife at bedside, both pleasant and at baseline Non-labored breathing on  RA RRR Stable truncal obesity, prior port and extraction sites c/d/I JP with scant non-foul serosanguinous output Stable mild LE edema (stable from pre-op)  Disposition: Discharge disposition: 01-Home or Self Care          Follow-up Information     Loletta Parish., MD Follow up on 07/05/2022.   Specialty: Urology Why: at 36 AM for MD visit and pathology review. Contact information: [address] [place] [address] [phone]                 Signed: Loletta Parish. 06/25/2022, 12:52 PM

## 2022-06-25 NOTE — Plan of Care (Signed)
  Problem: Bowel/Gastric: Goal: Gastrointestinal status for postoperative course will improve Outcome: Progressing   Problem: Skin Integrity: Goal: Demonstration of wound healing without infection will improve Outcome: Progressing   Problem: Education: Goal: Knowledge of General Education information will improve Description: Including pain rating scale, medication(s)/side effects and non-pharmacologic comfort measures Outcome: Progressing   Problem: Health Behavior/Discharge Planning: Goal: Ability to manage health-related needs will improve Outcome: Progressing   Problem: Clinical Measurements: Goal: Ability to maintain clinical measurements within normal limits will improve Outcome: Progressing Goal: Will remain free from infection Outcome: Progressing Goal: Diagnostic test results will improve Outcome: Progressing Goal: Respiratory complications will improve Outcome: Progressing Goal: Cardiovascular complication will be avoided Outcome: Progressing   Problem: Activity: Goal: Risk for activity intolerance will decrease Outcome: Progressing   Problem: Nutrition: Goal: Adequate nutrition will be maintained Outcome: Progressing   Problem: Coping: Goal: Level of anxiety will decrease Outcome: Progressing   Problem: Pain Managment: Goal: General experience of comfort will improve Outcome: Progressing

## 2022-06-26 LAB — CULTURE, BLOOD (ROUTINE X 2)

## 2022-06-28 ENCOUNTER — Telehealth: Payer: Self-pay

## 2022-06-28 NOTE — Transitions of Care (Post Inpatient/ED Visit) (Unsigned)
   06/28/2022  Name: ARMEN WARING MRN: [id number] DOB: Jul 21, 1980  Today's TOC FU Call Status: Today's TOC FU Call Status:: Unsuccessul Call (1st Attempt) Unsuccessful Call (1st Attempt) Date: 06/28/22  Attempted to reach the patient regarding the most recent Inpatient/ED visit.  Follow Up Plan: Additional outreach attempts will be made to reach the patient to complete the Transitions of Care (Post Inpatient/ED visit) call.   Signature   Woodfin Ganja LPN [facility] Nurse Health Advisor Direct Dial [phone]

## 2022-06-29 NOTE — Transitions of Care (Post Inpatient/ED Visit) (Signed)
   06/29/2022  Name: CANDICE LUNNEY MRN: [id number] DOB: 05/09/1980  Today's TOC FU Call Status: Today's TOC FU Call Status:: Successful TOC FU Call Competed Unsuccessful Call (1st Attempt) Date: 06/28/22 Unsuccessful Call (2nd Attempt) Date: 06/29/22 [facility] FU Call Complete Date: 06/29/22  Attempted to reach the patient regarding the most recent Inpatient/ED visit.  Follow Up Plan: No further outreach attempts will be made at this time. We have been unable to contact the patient.  Signature   Woodfin Ganja LPN [facility] Nurse Health Advisor Direct Dial [phone]

## 2022-07-01 ENCOUNTER — Other Ambulatory Visit: Payer: Self-pay | Admitting: Internal Medicine

## 2022-07-12 ENCOUNTER — Telehealth: Payer: Self-pay | Admitting: Internal Medicine

## 2022-07-12 NOTE — Telephone Encounter (Signed)
Prescription Request  07/12/2022  LOV: 03/22/2022  What is the name of the medication or equipment?  clonazePAM (KLONOPIN) 0.5 MG tablet Gabapentin (NEURONTIN)   Have you contacted your pharmacy to request a refill? Yes   Which pharmacy would you like this sent to?  [id number] [place],  - [address] AT [facility] [address] [facility] [address] [place] [address] Phone: [phone] Fax: [phone]    Patient notified that their request is being sent to the clinical staff for review and that they should receive a response within 2 business days.   Please advise at Mobile [phone] (mobile)

## 2022-07-13 MED ORDER — CLONAZEPAM 0.5 MG PO TABS: 0.5000 mg | ORAL_TABLET | Freq: Two times a day (BID) | ORAL | 1 refills | Status: DC | PRN

## 2022-07-13 NOTE — Telephone Encounter (Signed)
Ok for [facility] refill  Unable for gabapentin as is not on current med list to refill

## 2022-07-16 ENCOUNTER — Inpatient Hospital Stay: Payer: BC Managed Care – PPO | Attending: Hematology and Oncology | Admitting: Hematology and Oncology

## 2022-07-16 ENCOUNTER — Other Ambulatory Visit: Payer: Self-pay

## 2022-07-16 ENCOUNTER — Encounter: Payer: Self-pay | Admitting: Hematology and Oncology

## 2022-07-16 VITALS — BP 136/88 | HR 80 | Temp 97.7°F | Resp 16 | Wt 298.0 lb

## 2022-07-16 DIAGNOSIS — C649 Malignant neoplasm of unspecified kidney, except renal pelvis: Secondary | ICD-10-CM | POA: Insufficient documentation

## 2022-07-16 DIAGNOSIS — Z86711 Personal history of pulmonary embolism: Secondary | ICD-10-CM | POA: Insufficient documentation

## 2022-07-16 DIAGNOSIS — C641 Malignant neoplasm of right kidney, except renal pelvis: Secondary | ICD-10-CM | POA: Diagnosis present

## 2022-07-16 DIAGNOSIS — Z7901 Long term (current) use of anticoagulants: Secondary | ICD-10-CM | POA: Diagnosis not present

## 2022-07-16 DIAGNOSIS — I2699 Other pulmonary embolism without acute cor pulmonale: Secondary | ICD-10-CM | POA: Diagnosis not present

## 2022-07-16 MED ORDER — LIDOCAINE-PRILOCAINE 2.5-2.5 % EX CREA
TOPICAL_CREAM | CUTANEOUS | 3 refills | Status: DC
Start: 2022-07-16 — End: 2022-10-14

## 2022-07-16 MED ORDER — ONDANSETRON HCL 8 MG PO TABS
8.0000 mg | ORAL_TABLET | Freq: Three times a day (TID) | ORAL | 1 refills | Status: DC | PRN
Start: 2022-07-16 — End: 2023-09-22

## 2022-07-16 MED ORDER — PROCHLORPERAZINE MALEATE 10 MG PO TABS
10.0000 mg | ORAL_TABLET | Freq: Four times a day (QID) | ORAL | 1 refills | Status: DC | PRN
Start: 2022-07-16 — End: 2023-09-22

## 2022-07-16 NOTE — Progress Notes (Signed)
[facility] CONSULT NOTE  Patient Care Team: Corwin Levins, MD as PCP - General  CHIEF COMPLAINTS/PURPOSE OF CONSULTATION:  PE  ASSESSMENT & PLAN:   This is a very pleasant 42 year old male patient with past medical history significant for renal mass concerning for renal cell carcinoma now diagnosed with a right-sided PE and on anticoagulation referred to hematology for additional recommendations.  He apparently was being planned for right nephrectomy however ended up having severe shortness of breath and was diagnosed with right-sided pulmonary embolism and right heart strain. He was started on anticoagulation.  Surgery had to be delayed given the new onset PE. We have clearly discussed that although it would have been ideal to proceed with nephrectomy as soon as possible, in the setting of acute PE, would not recommend elective surgeries for at least 3 months.  If the surgery is indeed deemed emergent, he may have to assume risks and proceed with surgery.  He however would be at very high risk of cardiovascular complications including death in the postop setting.  He is now status post right robotic radical nephrectomy with retroperitoneal lymph node dissection.  Findings included very aggressive appearing right upper pole renal mass with innumerable parasitic vessels hilar adenopathy superficial hepatic invasion. Final pathology indeed showed a T4 N0 tumor consistent with stage IV clear-cell renal cell carcinoma.  We have today discussed that it is an advanced renal cell carcinoma and the intent of treatment is unfortunately not curative although he may have prolonged progression free interval. There was no obvious evidence of metastatic disease on his postop CT chest abdomen pelvis however it is reasonable to repeat another staging scan in a few months once he recovers.  We have today discussed about considering pembrolizumab since he had near complete resection (there is positive  renal vein margin) along with periodic scans. I have today explained to the mechanical fraction of immunotherapy, adverse effects including but not limited to fatigue, dry skin, arthralgias, hypo-/hyperthyroidism, dry skin, rarely adverse effects which can be life-threatening such as nephritis hepatitis.  It virtually can attack any organ of the body however this is thankfully not very common.  In most cases it is very well-tolerated. He is very hopeful that he will respond very well to this.   He wants to get started in the first week of August.   HISTORY OF PRESENTING ILLNESS:  Marcus Burns 42 y.o. male is here because of PE and clear-cell renal cancer.  Please refer to the previous notes for complete history.  Since his last visit he underwent right radical nephrectomy and is here for postop follow-up and to discuss adjuvant recommendations.  He is still healing, doing his best.  He is back on Eliquis and is tolerating well.    MEDICAL HISTORY:  Past Medical History:  Diagnosis Date   Acute prostatitis 06/19/2007   ALLERGIC RHINITIS 06/19/2007   ANXIETY 06/19/2007   Cancer (HCC)    kidney   ELEVATED BLOOD PRESSURE WITHOUT DIAGNOSIS OF HYPERTENSION 06/19/2007   GERD 06/19/2007   GERD (gastroesophageal reflux disease)    History of COVID-19 01/24/2019   History of kidney stones    Hypertension    Inguinal hernia    Migraines    NEPHROLITHIASIS, HX OF 06/19/2007   PE (pulmonary thromboembolism) (HCC)     SURGICAL HISTORY: Past Surgical History:  Procedure Laterality Date   CYSTOSCOPY WITH RETROGRADE PYELOGRAM, URETEROSCOPY AND STENT PLACEMENT Bilateral 02/09/2019   Procedure: CYSTOSCOPY WITH RETROGRADE PYELOGRAM,  DIAGNOSTIC URETEROSCOPY AND STENT PLACEMENT;  Surgeon: Sebastian Ache, MD;  Location: [facility];  Service: Urology;  Laterality: Bilateral;  75 MINS   CYSTOSCOPY WITH RETROGRADE PYELOGRAM, URETEROSCOPY AND STENT PLACEMENT Bilateral 02/23/2019   Procedure: CYSTOSCOPY  WITH RETROGRADE PYELOGRAM, URETEROSCOPY AND STENT PLACEMENT;  Surgeon: Sebastian Ache, MD;  Location: [facility];  Service: Urology;  Laterality: Bilateral;  1 HR   HERNIA REPAIR     HOLMIUM LASER APPLICATION Bilateral 02/23/2019   Procedure: HOLMIUM LASER APPLICATION;  Surgeon: Sebastian Ache, MD;  Location: [facility];  Service: Urology;  Laterality: Bilateral;   ROBOT ASSISTED LAPAROSCOPIC NEPHRECTOMY Right 06/18/2022   Procedure: XI ROBOTIC ASSISTED LAPAROSCOPIC RADICAL NEPHRECTOMY AND PERICAVAL LYMPH NODE DISSECTION;  Surgeon: Loletta Parish., MD;  Location: [facility];  Service: Urology;  Laterality: Right;  3 HRS    SOCIAL HISTORY: Social History   Socioeconomic History   Marital status: Married    Spouse name: Not on file   Number of children: Not on file   Years of education: Not on file   Highest education level: Not on file  Occupational History   Not on file  Tobacco Use   Smoking status: Former   Smokeless tobacco: Former  Advertising account planner   Vaping status: Never Used  Substance and Sexual Activity   Alcohol use: No   Drug use: No   Sexual activity: Not on file  Other Topics Concern   Not on file  Social History Narrative   Not on file   Social Determinants of Health   Financial Resource Strain: Not on file  Food Insecurity: No Food Insecurity (06/19/2022)   Hunger Vital Sign    Worried About Running Out of Food in the Last Year: Never true    Ran Out of Food in the Last Year: Never true  Transportation Needs: No Transportation Needs (06/19/2022)   PRAPARE - Administrator, Civil Service (Medical): No    Lack of Transportation (Non-Medical): No  Physical Activity: Not on file  Stress: Not on file  Social Connections: Not on file  Intimate Partner Violence: Not At Risk (06/19/2022)   Humiliation, Afraid, Rape, and Kick questionnaire    Fear of Current or Ex-Partner: No    Emotionally Abused: No    Physically Abused: No    Sexually Abused: No    FAMILY  HISTORY: Family History  Problem Relation Age of Onset   Coronary artery disease Father    Anxiety disorder Mother    Anxiety disorder Brother    Diabetes Brother     ALLERGIES:  is allergic to amlodipine and doxycycline.  MEDICATIONS:  Current Outpatient Medications  Medication Sig Dispense Refill   acetaminophen (TYLENOL) 500 MG tablet Take 1,000 mg by mouth every 6 (six) hours as needed (pain.).     albuterol (VENTOLIN HFA) 108 (90 Base) MCG/ACT inhaler Inhale 2 puffs into the lungs every 6 (six) hours as needed for wheezing or shortness of breath. 8 g 1   apixaban (ELIQUIS) 5 MG TABS tablet Take 1 tablet (5 mg total) by mouth 2 (two) times daily. 60 tablet 6   APPLE CIDER VINEGAR PO Take 15 mLs by mouth daily.     cetirizine (ZYRTEC) 10 MG tablet Take 10 mg by mouth daily.     cholecalciferol (VITAMIN D3) 25 MCG (1000 UNIT) tablet Take 1,000 Units by mouth daily.     clonazePAM (KLONOPIN) 0.5 MG tablet Take 1 tablet (0.5 mg total) by mouth 2 (two)  times daily as needed for anxiety. 60 tablet 1   docusate sodium (COLACE) 100 MG capsule Take 1 capsule (100 mg total) by mouth 2 (two) times daily.     HYDROcodone-acetaminophen (NORCO) 5-325 MG tablet Take 1-2 tablets by mouth every 6 (six) hours as needed for moderate pain or severe pain. 20 tablet 0   iron polysaccharides (NU-IRON) 150 MG capsule Take 1 capsule (150 mg total) by mouth daily. (Patient taking differently: Take 150 mg by mouth every other day.) 90 capsule 1   lidocaine-prilocaine (EMLA) cream Apply to affected area once 30 g 3   metoprolol succinate (TOPROL-XL) 25 MG 24 hr tablet Take 1 tablet (25 mg total) by mouth daily. 30 tablet 3   ondansetron (ZOFRAN) 8 MG tablet Take 1 tablet (8 mg total) by mouth every 8 (eight) hours as needed for nausea or vomiting. 30 tablet 1   prochlorperazine (COMPAZINE) 10 MG tablet Take 1 tablet (10 mg total) by mouth every 6 (six) hours as needed for nausea or vomiting. 30 tablet 1    telmisartan (MICARDIS) 20 MG tablet TAKE 1 TABLET(20 MG) BY MOUTH DAILY 30 tablet 5   No current facility-administered medications for this visit.     PHYSICAL EXAMINATION: ECOG PERFORMANCE STATUS: 0 - Asymptomatic  Vitals:   07/16/22 1028  BP: 136/88  Pulse: 80  Resp: 16  Temp: 97.7 F (36.5 C)  SpO2: 100%    Filed Weights   07/16/22 1028  Weight: 298 lb (135.2 kg)     GENERAL:alert, no distress and comfortable Abdomen: Abdominal wound healing well.  LABORATORY DATA:  I have reviewed the data as listed Lab Results  Component Value Date   WBC 8.3 06/25/2022   HGB 8.6 (L) 06/25/2022   HCT 28.1 (L) 06/25/2022   MCV 88.4 06/25/2022   PLT 260 06/25/2022     Chemistry      Component Value Date/Time   NA 135 06/23/2022 0436   K 3.7 06/23/2022 0436   CL 106 06/23/2022 0436   CO2 22 06/23/2022 0436   BUN 21 (H) 06/23/2022 0436   CREATININE 1.72 (H) 06/23/2022 0436      Component Value Date/Time   CALCIUM 7.8 (L) 06/23/2022 0436   ALKPHOS 71 03/22/2022 0952   AST 17 03/22/2022 0952   ALT 14 03/22/2022 0952   BILITOT 0.5 03/22/2022 0952       RADIOGRAPHIC STUDIES: I have personally reviewed the radiological images as listed and agreed with the findings in the report. CT CHEST ABDOMEN PELVIS WO CONTRAST  Result Date: 06/21/2022 CLINICAL DATA:  Evaluate for occult bowel injury. Postoperative fevers. EXAM: CT CHEST, ABDOMEN AND PELVIS WITHOUT CONTRAST TECHNIQUE: Multidetector CT imaging of the chest, abdomen and pelvis was performed following the standard protocol without IV contrast. RADIATION DOSE REDUCTION: This exam was performed according to the departmental dose-optimization program which includes automated exposure control, adjustment of the mA and/or kV according to patient size and/or use of iterative reconstruction technique. COMPARISON:  CT angio chest from 05/10/2022 and CT AP 02/26/2022 FINDINGS: CT CHEST FINDINGS Cardiovascular: Heart size is normal.  No  pericardial effusion. Mediastinum/Nodes: Pneumomediastinum. Thyroid gland, trachea and esophagus are unremarkable. No enlarged mediastinal or axillary lymph nodes. Lungs/Pleura: Small bilateral pleural effusions. Multiple areas of subsegmental atelectasis noted in the right middle lobe, both lower lobes and lingula. No airspace consolidation or interstitial edema. No significant pneumothorax identified at this time. Musculoskeletal: There is extensive right chest wall emphysema identified anteriorly, laterally and posteriorly.  This extends into the visualized portions of the right lower neck. No acute or suspicious osseous findings. CT ABDOMEN PELVIS FINDINGS Hepatobiliary: No focal liver abnormality. Gallstones are noted dependently. Pancreas: Unremarkable. No pancreatic ductal dilatation or surrounding inflammatory changes. Spleen: Normal in size without focal abnormality. Adrenals/Urinary Tract: Normal appearance of the adrenal glands. Status post right nephrectomy. There is a collection gas and amorphous debris within the right renal fossa and along the inferior margin of the right hepatic lobe which measures 6.7 x 4.6 by 5.2 cm, image 60/2 and image 110/4. Percutaneous drainage catheter is identified which enters from a right lateral approach and terminates along the inferior margin of the right hepatic lobe, image 66/2. Multiple small stones are again seen within the left kidney measuring up to 4 mm. No left-sided hydronephrosis. A small amount of gas noted within the non dependent portion of the urinary bladder. Stomach/Bowel: Enteric contrast material it is identified within the dependent portion of the stomach. There is also contrast material identified within the distal small bowel loops and ascending colon the appendix is visualized and appears normal. No extravasation of the enteric contrast material is identified to suggest a site of suspected bowel injury. No pathologic dilatation of the large or small  bowel loops. Mild gaseous distension of the colon with air-fluid levels noted along the transverse colon. Findings are favored to represent postoperative ileus. Vascular/Lymphatic: Normal appearance of the abdominal aorta. No signs of abdominopelvic adenopathy. Reproductive: Prostate is unremarkable. Other: Pneumoperitoneum is identified. Gas is also seen within the right retroperitoneum. Soft tissue stranding is noted extending along the right pericolic gutter and right retroperitoneal space. No significant free fluid. No focal fluid collections identified to suggest abscess. Musculoskeletal: Extensive subcutaneous gas is identified extending along the ventral abdominal wall, right flank and right posterior abdominal wall. Gas is also noted tracking into bilateral inguinal canals and into the scrotum. There is also gas noted along both sides of the ventral pelvis and extending into bilateral upper thighs. No suspicious bone lesions identified. IMPRESSION: 1. Status post right nephrectomy. 2. Nonspecific postoperative collection of gas and amorphous debris within the right renal fossa and the inferior margin of the right hepatic lobe which measures 6.7 x 4.6 by 5.2 cm. Percutaneous drainage catheter is identified which terminates along the inferior margin of the right hepatic lobe. 3. Extensive subcutaneous gas is identified extending along the ventral abdominal wall, right flank and right posterior abdominal wall. Gas is also noted tracking into bilateral inguinal canals and into the scrotum. There is also gas noted along both sides of the ventral pelvis and extending into bilateral upper thighs. Gas also extends along the right chest wall and into the right lower neck. Pneumomediastinum is also noted. 4. Pneumoperitoneum is identified. No extravasation of enteric contrast material no extravasation of the enteric contrast material is identified to suggest a site of suspected bowel injury. 5. Small bilateral pleural  effusions with multiple areas of subsegmental atelectasis in the right middle lobe, both lower lobes and lingula. 6. Gallstones. 7. Nonobstructing left renal calculi. Critical Value/emergent results were called by telephone at the time of interpretation on 06/21/2022 at 4:58 pm to provider [facility] , who verbally acknowledged these results. Electronically Signed   By: Signa Kell M.D.   On: 06/21/2022 16:59   DG CHEST PORT 1 VIEW  Result Date: 06/21/2022 CLINICAL DATA:  Status post robotic assisted laparoscopic right nephrectomy for carcinoma 3 days ago. Now with fever. [id number]. EXAM: PORTABLE CHEST  1 VIEW COMPARISON:  CTA chest 05/10/2022. FINDINGS: 6:56 a.m. There is extensive right chest wall emphysema extending into the supraclavicular fossa and right neck. There is no visible pneumothorax. There is a moderate pneumomediastinum also noted. This may have resulted from free intraperitoneal air as there is a crescent of free air underneath the right hemidiaphragm. The free air is most likely residual from the surgery, less likely but not strictly excluded perforated hollow viscus. The lungs expiratory with bibasilar atelectatic bands. The aerated lungs are otherwise clear. The cardiac size, mediastinal silhouette and vasculature are normal. No displaced rib fracture is seen. IMPRESSION: 1. Extensive right chest wall emphysema extending into the supraclavicular fossa and right neck. 2. Moderate pneumomediastinum. 3. Crescent of free air underneath the right hemidiaphragm, most likely residual from the surgery, less likely but not strictly excluded perforated hollow viscus. 4. Expiratory lungs with bibasilar atelectasis. Electronically Signed   By: Almira Bar M.D.   On: 06/21/2022 07:09    All questions were answered. The patient knows to call the clinic with any problems, questions or concerns. I spent 30 minutes in the care of this patient including H and P, review of records, counseling and  coordination of care.     Rachel Moulds, MD 07/16/2022 11:02 AM

## 2022-07-16 NOTE — Progress Notes (Signed)
START ON PATHWAY REGIMEN - Renal Cell     A cycle is every 21 days:     Pembrolizumab   **Always confirm dose/schedule in your pharmacy ordering system**  Patient Characteristics: Postoperative without Neoadjuvant Therapy, M0 (Pathologic Staging), Stage I, II, III, or Resected T4M0 Stage IV, High Risk Therapeutic Status: Postoperative without Neoadjuvant Therapy, M0 (Pathologic Staging) AJCC M Category: cM0 AJCC 8 Stage Grouping: IV AJCC T Category: pT4 AJCC N Category: pN0 Risk Status: High Risk Intent of Therapy: Non-Curative / Palliative Intent, Discussed with Patient

## 2022-07-17 ENCOUNTER — Other Ambulatory Visit: Payer: Self-pay

## 2022-07-17 ENCOUNTER — Telehealth: Payer: Self-pay | Admitting: Hematology and Oncology

## 2022-07-17 NOTE — Telephone Encounter (Signed)
Contacted patient to scheduled appointments. Left message with appointment details and a call back number if patient had any questions or could not accommodate the time we provided.   

## 2022-07-27 ENCOUNTER — Ambulatory Visit: Payer: 59 | Admitting: Hematology and Oncology

## 2022-08-04 ENCOUNTER — Encounter: Payer: Self-pay | Admitting: Hematology and Oncology

## 2022-08-04 NOTE — Progress Notes (Signed)
Pharmacist Chemotherapy Monitoring - Initial Assessment    Anticipated start date: 08/11/22   The following has been reviewed per standard work regarding the patient's treatment regimen: The patient's diagnosis, treatment plan and drug doses, and organ/hematologic function Lab orders and baseline tests specific to treatment regimen  The treatment plan start date, drug sequencing, and pre-medications Prior authorization status  Patient's documented medication list, including drug-drug interaction screen and prescriptions for anti-emetics and supportive care specific to the treatment regimen The drug concentrations, fluid compatibility, administration routes, and timing of the medications to be used The patient's access for treatment and lifetime cumulative dose history, if applicable  The patient's medication allergies and previous infusion related reactions, if applicable   Changes made to treatment plan:  N/A  Follow up needed:  N/A   Jerry Caras, PharmD PGY2 Oncology Pharmacy Resident   08/04/2022 2:26 PM

## 2022-08-09 ENCOUNTER — Encounter: Payer: Self-pay | Admitting: Hematology and Oncology

## 2022-08-10 ENCOUNTER — Encounter: Payer: Self-pay | Admitting: Hematology and Oncology

## 2022-08-11 ENCOUNTER — Inpatient Hospital Stay: Payer: BC Managed Care – PPO | Attending: Hematology and Oncology

## 2022-08-11 ENCOUNTER — Inpatient Hospital Stay: Payer: BC Managed Care – PPO

## 2022-08-11 ENCOUNTER — Inpatient Hospital Stay (HOSPITAL_BASED_OUTPATIENT_CLINIC_OR_DEPARTMENT_OTHER): Payer: BC Managed Care – PPO | Admitting: Physician Assistant

## 2022-08-11 ENCOUNTER — Encounter: Payer: Self-pay | Admitting: Hematology and Oncology

## 2022-08-11 VITALS — BP 138/90 | HR 73 | Temp 97.7°F | Resp 18

## 2022-08-11 VITALS — BP 149/94 | HR 89 | Temp 98.6°F | Resp 16 | Ht 76.0 in | Wt 306.0 lb

## 2022-08-11 DIAGNOSIS — C641 Malignant neoplasm of right kidney, except renal pelvis: Secondary | ICD-10-CM | POA: Diagnosis present

## 2022-08-11 DIAGNOSIS — Z86711 Personal history of pulmonary embolism: Secondary | ICD-10-CM

## 2022-08-11 DIAGNOSIS — Z5112 Encounter for antineoplastic immunotherapy: Secondary | ICD-10-CM | POA: Diagnosis not present

## 2022-08-11 DIAGNOSIS — Z7901 Long term (current) use of anticoagulants: Secondary | ICD-10-CM | POA: Insufficient documentation

## 2022-08-11 DIAGNOSIS — Z79620 Long term (current) use of immunosuppressive biologic: Secondary | ICD-10-CM | POA: Insufficient documentation

## 2022-08-11 DIAGNOSIS — I2699 Other pulmonary embolism without acute cor pulmonale: Secondary | ICD-10-CM

## 2022-08-11 LAB — CMP (CANCER CENTER ONLY)
ALT: 10 U/L (ref 0–44)
AST: 12 U/L — ABNORMAL LOW (ref 15–41)
Albumin: 4.1 g/dL (ref 3.5–5.0)
Alkaline Phosphatase: 73 U/L (ref 38–126)
Anion gap: 7 (ref 5–15)
BUN: 19 mg/dL (ref 6–20)
CO2: 26 mmol/L (ref 22–32)
Calcium: 9.6 mg/dL (ref 8.9–10.3)
Chloride: 108 mmol/L (ref 98–111)
Creatinine: 1.57 mg/dL — ABNORMAL HIGH (ref 0.61–1.24)
GFR, Estimated: 56 mL/min — ABNORMAL LOW (ref >60)
Glucose, Bld: 89 mg/dL (ref 70–99)
Potassium: 4.4 mmol/L (ref 3.5–5.1)
Sodium: 141 mmol/L (ref 135–145)
Total Bilirubin: 0.5 mg/dL (ref 0.3–1.2)
Total Protein: 8.2 g/dL — ABNORMAL HIGH (ref 6.5–8.1)

## 2022-08-11 LAB — CBC WITH DIFFERENTIAL (CANCER CENTER ONLY)
Abs Immature Granulocytes: 0.02 10*3/uL (ref 0.00–0.07)
Basophils Absolute: 0 10*3/uL (ref 0.0–0.1)
Basophils Relative: 0 %
Eosinophils Absolute: 0.2 10*3/uL (ref 0.0–0.5)
Eosinophils Relative: 3 %
HCT: 33.9 % — ABNORMAL LOW (ref 39.0–52.0)
Hemoglobin: 11.1 g/dL — ABNORMAL LOW (ref 13.0–17.0)
Immature Granulocytes: 0 %
Lymphocytes Relative: 29 %
Lymphs Abs: 1.6 10*3/uL (ref 0.7–4.0)
MCH: 27.3 pg (ref 26.0–34.0)
MCHC: 32.7 g/dL (ref 30.0–36.0)
MCV: 83.5 fL (ref 80.0–100.0)
Monocytes Absolute: 0.3 10*3/uL (ref 0.1–1.0)
Monocytes Relative: 6 %
Neutro Abs: 3.3 10*3/uL (ref 1.7–7.7)
Neutrophils Relative %: 62 %
Platelet Count: 300 10*3/uL (ref 150–400)
RBC: 4.06 MIL/uL — ABNORMAL LOW (ref 4.22–5.81)
RDW: 16.9 % — ABNORMAL HIGH (ref 11.5–15.5)
WBC Count: 5.3 10*3/uL (ref 4.0–10.5)
nRBC: 0 % (ref 0.0–0.2)

## 2022-08-11 LAB — TSH: TSH: 2.919 u[IU]/mL (ref 0.350–4.500)

## 2022-08-11 MED ORDER — SODIUM CHLORIDE 0.9 % IV SOLN
200.0000 mg | Freq: Once | INTRAVENOUS | Status: AC
  Administered 2022-08-11: 200 mg via INTRAVENOUS
  Filled 2022-08-11: qty 200

## 2022-08-11 MED ORDER — SODIUM CHLORIDE 0.9 % IV SOLN: Freq: Once | INTRAVENOUS | Status: AC

## 2022-08-11 NOTE — Patient Instructions (Signed)

## 2022-08-12 ENCOUNTER — Encounter: Payer: Self-pay | Admitting: Hematology and Oncology

## 2022-08-12 ENCOUNTER — Telehealth: Payer: Self-pay

## 2022-08-12 NOTE — Telephone Encounter (Signed)
Marcus Burns states that he is doing fine. He is eating, drinking, and urinating well. He knows to call the office at [phone] if he has any questions or concerns.

## 2022-08-12 NOTE — Progress Notes (Signed)
[facility] PROGRESS NOTE  Patient Care Team: Corwin Levins, MD as PCP - General  CHIEF COMPLAINTS/PURPOSE OF CONSULTATION:  Clear cell renal cell carcinoma  Oncology History  Clear cell carcinoma of kidney (HCC)  07/16/2022 Initial Diagnosis   Clear cell carcinoma of kidney (HCC)   08/11/2022 -  Chemotherapy   Patient is on Treatment Plan : RENAL Pembrolizumab (200) q21d       CURRENT TREATMENT: Single agent pembrolizumab, plan to start today.  INTERIM HISTORY:  Marcus Burns 42 y.o. male returns for a follow up for clear-cell renal carcinoma. He presents today to start Cycle 1 of pembrolizumab.   Marcus Burns reports he is doing well and energy levels are at baseline. He denies any appetite changes or weight loss. He denies nausea, vomiting or bowel habit changes. He denies easy bruising or signs of active bleeding. He denies fevers, chills, sweats, shortness of breath, chest pain or cough. He has no other complaints.  REVIEW OF SYSTEMS:   Constitutional: Negative for appetite change, fatigue, chills, fever and unexpected weight change HENT: Negative for mouth sores, nosebleeds, sore throat and trouble swallowing.   Eyes: Negative for eye problems and icterus.  Respiratory: Negative for cough, hemoptysis, shortness of breath and wheezing.   Cardiovascular: Negative for chest pain and leg swelling.  Gastrointestinal: Negative for abdominal pain, constipation, diarrhea, nausea and vomiting.  Genitourinary: Negative for bladder incontinence, difficulty urinating, dysuria, frequency and hematuria.   Musculoskeletal: Negative for back pain, gait problem, neck pain and neck stiffness.  Skin:Negative for rash and ulcers Neurological: Negative for dizziness, extremity weakness, gait problem, headaches, light-headedness and seizures.  Hematological: Negative for adenopathy. Does not bruise/bleed easily.  Psychiatric/Behavioral: Negative for confusion, depression and  sleep disturbance. The patient is not nervous/anxious.    MEDICAL HISTORY:  Past Medical History:  Diagnosis Date   Acute prostatitis 06/19/2007   ALLERGIC RHINITIS 06/19/2007   ANXIETY 06/19/2007   Cancer (HCC)    kidney   ELEVATED BLOOD PRESSURE WITHOUT DIAGNOSIS OF HYPERTENSION 06/19/2007   GERD 06/19/2007   GERD (gastroesophageal reflux disease)    History of COVID-19 01/24/2019   History of kidney stones    Hypertension    Inguinal hernia    Migraines    NEPHROLITHIASIS, HX OF 06/19/2007   PE (pulmonary thromboembolism) (HCC)     SURGICAL HISTORY: Past Surgical History:  Procedure Laterality Date   CYSTOSCOPY WITH RETROGRADE PYELOGRAM, URETEROSCOPY AND STENT PLACEMENT Bilateral 02/09/2019   Procedure: CYSTOSCOPY WITH RETROGRADE PYELOGRAM, DIAGNOSTIC URETEROSCOPY AND STENT PLACEMENT;  Surgeon: Sebastian Ache, MD;  Location: [facility];  Service: Urology;  Laterality: Bilateral;  75 MINS   CYSTOSCOPY WITH RETROGRADE PYELOGRAM, URETEROSCOPY AND STENT PLACEMENT Bilateral 02/23/2019   Procedure: CYSTOSCOPY WITH RETROGRADE PYELOGRAM, URETEROSCOPY AND STENT PLACEMENT;  Surgeon: Sebastian Ache, MD;  Location: [facility];  Service: Urology;  Laterality: Bilateral;  1 HR   HERNIA REPAIR     HOLMIUM LASER APPLICATION Bilateral 02/23/2019   Procedure: HOLMIUM LASER APPLICATION;  Surgeon: Sebastian Ache, MD;  Location: [facility];  Service: Urology;  Laterality: Bilateral;   ROBOT ASSISTED LAPAROSCOPIC NEPHRECTOMY Right 06/18/2022   Procedure: XI ROBOTIC ASSISTED LAPAROSCOPIC RADICAL NEPHRECTOMY AND PERICAVAL LYMPH NODE DISSECTION;  Surgeon: Loletta Parish., MD;  Location: [facility];  Service: Urology;  Laterality: Right;  3 HRS    SOCIAL HISTORY: Social History   Socioeconomic History   Marital status: Married    Spouse name: Not on file   Number of  children: Not on file   Years of education: Not on file   Highest education level: Not on file  Occupational History   Not on file  Tobacco  Use   Smoking status: Former   Smokeless tobacco: Former  Advertising account planner   Vaping status: Never Used  Substance and Sexual Activity   Alcohol use: No   Drug use: No   Sexual activity: Not on file  Other Topics Concern   Not on file  Social History Narrative   Not on file   Social Determinants of Health   Financial Resource Strain: Not on file  Food Insecurity: No Food Insecurity (06/19/2022)   Hunger Vital Sign    Worried About Running Out of Food in the Last Year: Never true    Ran Out of Food in the Last Year: Never true  Transportation Needs: No Transportation Needs (06/19/2022)   PRAPARE - Administrator, Civil Service (Medical): No    Lack of Transportation (Non-Medical): No  Physical Activity: Not on file  Stress: Not on file  Social Connections: Not on file  Intimate Partner Violence: Not At Risk (06/19/2022)   Humiliation, Afraid, Rape, and Kick questionnaire    Fear of Current or Ex-Partner: No    Emotionally Abused: No    Physically Abused: No    Sexually Abused: No    FAMILY HISTORY: Family History  Problem Relation Age of Onset   Coronary artery disease Father    Anxiety disorder Mother    Anxiety disorder Brother    Diabetes Brother     ALLERGIES:  is allergic to amlodipine and doxycycline.  MEDICATIONS:  Current Outpatient Medications  Medication Sig Dispense Refill   acetaminophen (TYLENOL) 500 MG tablet Take 1,000 mg by mouth every 6 (six) hours as needed (pain.).     albuterol (VENTOLIN HFA) 108 (90 Base) MCG/ACT inhaler Inhale 2 puffs into the lungs every 6 (six) hours as needed for wheezing or shortness of breath. 8 g 1   apixaban (ELIQUIS) 5 MG TABS tablet Take 1 tablet (5 mg total) by mouth 2 (two) times daily. 60 tablet 6   APPLE CIDER VINEGAR PO Take 15 mLs by mouth daily.     cetirizine (ZYRTEC) 10 MG tablet Take 10 mg by mouth daily.     cholecalciferol (VITAMIN D3) 25 MCG (1000 UNIT) tablet Take 1,000 Units by mouth daily.      clonazePAM (KLONOPIN) 0.5 MG tablet Take 1 tablet (0.5 mg total) by mouth 2 (two) times daily as needed for anxiety. 60 tablet 1   docusate sodium (COLACE) 100 MG capsule Take 1 capsule (100 mg total) by mouth 2 (two) times daily.     HYDROcodone-acetaminophen (NORCO) 5-325 MG tablet Take 1-2 tablets by mouth every 6 (six) hours as needed for moderate pain or severe pain. 20 tablet 0   iron polysaccharides (NU-IRON) 150 MG capsule Take 1 capsule (150 mg total) by mouth daily. (Patient taking differently: Take 150 mg by mouth every other day.) 90 capsule 1   lidocaine-prilocaine (EMLA) cream Apply to affected area once 30 g 3   metoprolol succinate (TOPROL-XL) 25 MG 24 hr tablet Take 1 tablet (25 mg total) by mouth daily. 30 tablet 3   ondansetron (ZOFRAN) 8 MG tablet Take 1 tablet (8 mg total) by mouth every 8 (eight) hours as needed for nausea or vomiting. 30 tablet 1   prochlorperazine (COMPAZINE) 10 MG tablet Take 1 tablet (10 mg total) by mouth every  6 (six) hours as needed for nausea or vomiting. 30 tablet 1   telmisartan (MICARDIS) 20 MG tablet TAKE 1 TABLET(20 MG) BY MOUTH DAILY 30 tablet 5   No current facility-administered medications for this visit.     PHYSICAL EXAMINATION: ECOG PERFORMANCE STATUS: 0 - Asymptomatic  Vitals:   08/11/22 1336  BP: (!) 149/94  Pulse: 89  Resp: 16  Temp: 98.6 F (37 C)  SpO2: 100%    Filed Weights   08/11/22 1336  Weight: (!) 306 lb (138.8 kg)   Constitutional: Oriented to person, place, and time and well-developed, well-nourished, and in no distress.  HENT:  Head: Normocephalic and atraumatic.  Eyes: Conjunctivae are normal. Right eye exhibits no discharge. Left eye exhibits no discharge. No scleral icterus.  Cardiovascular: Normal rate, regular rhythm, normal heart sounds  Pulmonary/Chest: Effort normal and breath sounds normal. No respiratory distress. No wheezes. No rales.  Musculoskeletal: Normal range of motion. Exhibits no edema.   Neurological: Alert and oriented to person, place, and time. Exhibits normal muscle tone. Gait normal. Coordination normal.  Skin: Skin is warm and dry. No rash noted. Not diaphoretic. No erythema. No pallor.  Psychiatric: Mood, memory and judgment normal.   LABORATORY DATA:  I have reviewed the data as listed Lab Results  Component Value Date   WBC 5.3 08/11/2022   HGB 11.1 (L) 08/11/2022   HCT 33.9 (L) 08/11/2022   MCV 83.5 08/11/2022   PLT 300 08/11/2022     Chemistry      Component Value Date/Time   NA 141 08/11/2022 1320   K 4.4 08/11/2022 1320   CL 108 08/11/2022 1320   CO2 26 08/11/2022 1320   BUN 19 08/11/2022 1320   CREATININE 1.57 (H) 08/11/2022 1320      Component Value Date/Time   CALCIUM 9.6 08/11/2022 1320   ALKPHOS 73 08/11/2022 1320   AST 12 (L) 08/11/2022 1320   ALT 10 08/11/2022 1320   BILITOT 0.5 08/11/2022 1320     PATHOLOGY:  SURGICAL PATHOLOGY * THIS IS AN ADDENDUM REPORT ** CASE: [id number] PATIENT: Abel Presto Surgical Pathology Report   Reason for Addendum #1:  Immunohistochemistry results  Clinical History: Right renal mass (crm)     FINAL MICROSCOPIC DIAGNOSIS:  A. KIDNEY, RIGHT AND PERICAVAL LYMPH NODES, RADICAL NEPHRECTOMY:      Clear cell renal cell carcinoma, WHO / ISUP grade 3 (of 4).      Tumor size: 14.5 x 12 x 8.5 cm.      Tumor necrosis identified accounting for approximately 15% of parenchyma.      Carcinoma disrupts capsule and invades into perinephric tissue, adrenal parenchyma, renal vein and renal sinus.      Renal vein margin is positive for carcinoma. Ureteral and vascular margins are negative for carcinoma. Separated tumor satellite focus (0.5 cm). Background renal parenchyma with focal interstitial inflammation without glomerulosclerosis, tubal atrophy or vasculopathy. See oncology table.  ONCOLOGY TABLE:  KIDNEY: Nephrectomy  Procedure: Nephrectomy Specimen Laterality: Right Tumor Size: 14.5 x  12 x 8.5 cm Tumor Focality: One focus of mail tumor (14.5 cm) with one fucus of tumor satellite nodule (0.5 cm) Histologic Type: Clear cell renal cell carcinoma Sarcomatoid Features: Not identified Rhabdoid Features: Not identified Histologic Grade: Grade 3 Tumor Necrosis: present, 15% of the parenchymal volume Tumor Extension: and invades into perinephric tissue, adrenal parenchyma, renal vein and renal sinus. Lymphatic and/or Vascular Invasion:  Not identified Margins: Renal vein margin is positive for tumor Regional Lymph  Nodes:      Number of Lymph Nodes with Tumor: 0           Number of Lymph Nodes Examined: 1 Distant Metastasis:      Distant Site(s) Involved: Not applicable Additional Findings in Nonneoplastic Kidney: Focal interstitial inflammation without glomerulosclerosis, tubal atrophy or vasculopathy Pathologic Stage Classification (pTNM, AJCC 8th Edition): pT4, pN0 Ancillary Studies: Can be performed upon request   RADIOGRAPHIC STUDIES: I have personally reviewed the radiological images as listed and agreed with the findings in the report. No results found.  ASSESSMENT & PLAN:  Marcus Burns is a 42 y.o. male who presents for a follow up for clear cell renal carcinoma.   #T4N0 Clear Cell Renal Cell Carcinoma: --Underwent right robotic radical nephrectomy with retroperitoneal lymph node dissection on 06/18/2022. Pathology revealed clear cell renal cell carcinoma. Tumor invades into perinephric tissue, adrenal parenchyma, renal vein and renal sinus. Renal vein margin is positive for tumor.  --Post op CT CAP from 06/21/2022 did not revealed any obvious signs of metastatic disease.  --Recommendation is single agent pembrolizumab PLAN: --Due to start Cycle 1, Day 1 of of Pembrolizumab.  --Labs were reviewed and adequate for treamtent. WBC 5.3, Hgb 11.1, Plt 300K, Creatinine improving to 1.57. LFTs in range.  --Proceed with treatment today without any modifications.   --RTC in 3 weeks with labs and follow up before Cycle 2, Day 1.  #Pulmonary emboli with right heart strain: --Found on CTA chest from 03/11/2022.   --Currently on Eliquis 5 mg PO twice daily   All questions were answered. The patient knows to call the clinic with any problems, questions or concerns.  I have spent a total of 30 minutes minutes of face-to-face and non-face-to-face time, preparing to see the patient, obtaining and/or reviewing separately obtained history, performing a medically appropriate examination, counseling and educating the patient, documenting clinical information in the electronic health record, independently interpreting results and communicating results to the patient, and care coordination.   Georga Kaufmann PA-C Dept of Hematology and Oncology [facility] at [facility] Phone: [phone]

## 2022-08-12 NOTE — Telephone Encounter (Signed)
-----   Message from Nurse Lanora Manis T sent at 08/11/2022  4:22 PM EDT ----- Regarding: Dr Al Pimple 1st time [facility] pt Dr Al Pimple pt received 1st time keytruda infusion. Pt tolerated tx well w/o complaint. VSS at d/c. Pt was educated by this RN and verbalized understanding. Pt due for callback.

## 2022-08-19 ENCOUNTER — Encounter (INDEPENDENT_AMBULATORY_CARE_PROVIDER_SITE_OTHER): Payer: Self-pay

## 2022-08-23 ENCOUNTER — Other Ambulatory Visit: Payer: Self-pay | Admitting: Internal Medicine

## 2022-09-01 ENCOUNTER — Inpatient Hospital Stay: Payer: BC Managed Care – PPO

## 2022-09-01 ENCOUNTER — Inpatient Hospital Stay (HOSPITAL_BASED_OUTPATIENT_CLINIC_OR_DEPARTMENT_OTHER): Payer: BC Managed Care – PPO | Admitting: Nurse Practitioner

## 2022-09-01 VITALS — BP 133/88 | HR 80

## 2022-09-01 DIAGNOSIS — Z5112 Encounter for antineoplastic immunotherapy: Secondary | ICD-10-CM | POA: Diagnosis not present

## 2022-09-01 DIAGNOSIS — I2699 Other pulmonary embolism without acute cor pulmonale: Secondary | ICD-10-CM

## 2022-09-01 DIAGNOSIS — C641 Malignant neoplasm of right kidney, except renal pelvis: Secondary | ICD-10-CM

## 2022-09-01 LAB — CBC WITH DIFFERENTIAL (CANCER CENTER ONLY)
Abs Immature Granulocytes: 0 10*3/uL (ref 0.00–0.07)
Basophils Absolute: 0 10*3/uL (ref 0.0–0.1)
Basophils Relative: 0 %
Eosinophils Absolute: 0.2 10*3/uL (ref 0.0–0.5)
Eosinophils Relative: 4 %
HCT: 34.4 % — ABNORMAL LOW (ref 39.0–52.0)
Hemoglobin: 11.2 g/dL — ABNORMAL LOW (ref 13.0–17.0)
Immature Granulocytes: 0 %
Lymphocytes Relative: 25 %
Lymphs Abs: 1.3 10*3/uL (ref 0.7–4.0)
MCH: 27.3 pg (ref 26.0–34.0)
MCHC: 32.6 g/dL (ref 30.0–36.0)
MCV: 83.9 fL (ref 80.0–100.0)
Monocytes Absolute: 0.3 10*3/uL (ref 0.1–1.0)
Monocytes Relative: 6 %
Neutro Abs: 3.4 10*3/uL (ref 1.7–7.7)
Neutrophils Relative %: 65 %
Platelet Count: 272 10*3/uL (ref 150–400)
RBC: 4.1 MIL/uL — ABNORMAL LOW (ref 4.22–5.81)
RDW: 17.2 % — ABNORMAL HIGH (ref 11.5–15.5)
WBC Count: 5.3 10*3/uL (ref 4.0–10.5)
nRBC: 0 % (ref 0.0–0.2)

## 2022-09-01 LAB — CMP (CANCER CENTER ONLY)
ALT: 16 U/L (ref 0–44)
AST: 18 U/L (ref 15–41)
Albumin: 4.1 g/dL (ref 3.5–5.0)
Alkaline Phosphatase: 74 U/L (ref 38–126)
Anion gap: 5 (ref 5–15)
BUN: 21 mg/dL — ABNORMAL HIGH (ref 6–20)
CO2: 29 mmol/L (ref 22–32)
Calcium: 9.9 mg/dL (ref 8.9–10.3)
Chloride: 105 mmol/L (ref 98–111)
Creatinine: 1.51 mg/dL — ABNORMAL HIGH (ref 0.61–1.24)
GFR, Estimated: 58 mL/min — ABNORMAL LOW (ref >60)
Glucose, Bld: 86 mg/dL (ref 70–99)
Potassium: 4.6 mmol/L (ref 3.5–5.1)
Sodium: 139 mmol/L (ref 135–145)
Total Bilirubin: 0.5 mg/dL (ref 0.3–1.2)
Total Protein: 8.1 g/dL (ref 6.5–8.1)

## 2022-09-01 MED ORDER — SODIUM CHLORIDE 0.9 % IV SOLN: Freq: Once | INTRAVENOUS | Status: AC

## 2022-09-01 MED ORDER — SODIUM CHLORIDE 0.9 % IV SOLN
200.0000 mg | Freq: Once | INTRAVENOUS | Status: AC
  Administered 2022-09-01: 200 mg via INTRAVENOUS
  Filled 2022-09-01: qty 200

## 2022-09-01 NOTE — Progress Notes (Unsigned)
Patient Care Team: Corwin Levins, MD as PCP - General  Clinic Day:  09/01/2022  Referring physician: Rachel Moulds, MD  ASSESSMENT & PLAN:   Assessment & Plan: Clear cell carcinoma of kidney (HCC) 09/01/2022 - presents for Cycle 2 day 1 keytruda.  -tolerated cycle 1 well without negative side effects.  -states he is doing well and energy levels are at baseline. He denies any appetite changes or weight loss. He denies nausea, vomiting or bowel habit changes. He denies easy bruising or signs of active bleeding. He denies fevers, chills, sweats, shortness of breath, chest pain or cough. He has no other complaints.  Labs, follow up, treatment in 21 days     Plan: Labs reviewed  -CBC showing WBC 5.3; Hgb 11.2; Hct 34.4; Plt 272; Anc 3.4 -CMP - K 4.6; glucose 86; BUN 21; Creatinine 1.51; eGFR 58; Ca 9.9; LFTs normal.   Proceed with Cycle 2 day 1 Keytruda  Labs, follow up, and Cycle 3 day 1 In 3 weeks   The patient understands the plans discussed today and is in agreement with them.  He knows to contact our office if he develops concerns prior to his next appointment.  I provided 30 minutes of face-to-face time during this encounter and > 50% was spent counseling as documented under my assessment and plan.    Carlean Jews, NP  [facility] AT [facility] [address] [place] [address] Dept: [phone] Dept Fax: [phone]   No orders of the defined types were placed in this encounter.     CHIEF COMPLAINT:  CC: clear cell carcinoma of right kidney   Current Treatment:  pembrolizumab every 21 days   INTERVAL HISTORY:  Maggie is here today for repeat clinical assessment. He denies fevers or chills. He denies pain. His appetite is good. His weight has increased 4 pounds over last 3 weeks .  Clear Cell Carcinoma right kidney CURITS VINING 42 y.o. male returns for a follow up for clear-cell renal carcinoma.   He presents today for Cycle 2 of pembrolizumab. Tolerated initial cycle very well. Mr. Mutch reports he is doing well and energy levels are at baseline. He denies any appetite changes or weight loss. He denies nausea, vomiting or bowel habit changes. He denies easy bruising or signs of active bleeding. He denies fevers, chills, sweats, shortness of breath, chest pain or cough. He has no other complaints.  I have reviewed the past medical history, past surgical history, social history and family history with the patient and they are unchanged from previous note.  ALLERGIES:  is allergic to amlodipine and doxycycline.  MEDICATIONS:  Current Outpatient Medications  Medication Sig Dispense Refill   acetaminophen (TYLENOL) 500 MG tablet Take 1,000 mg by mouth every 6 (six) hours as needed (pain.).     albuterol (VENTOLIN HFA) 108 (90 Base) MCG/ACT inhaler Inhale 2 puffs into the lungs every 6 (six) hours as needed for wheezing or shortness of breath. 8 g 1   apixaban (ELIQUIS) 5 MG TABS tablet Take 1 tablet (5 mg total) by mouth 2 (two) times daily. 60 tablet 6   APPLE CIDER VINEGAR PO Take 15 mLs by mouth daily.     cetirizine (ZYRTEC) 10 MG tablet Take 10 mg by mouth daily.     cholecalciferol (VITAMIN D3) 25 MCG (1000 UNIT) tablet Take 1,000 Units by mouth daily.     clonazePAM (KLONOPIN) 0.5 MG tablet Take  1 tablet (0.5 mg total) by mouth 2 (two) times daily as needed for anxiety. 60 tablet 1   docusate sodium (COLACE) 100 MG capsule Take 1 capsule (100 mg total) by mouth 2 (two) times daily.     HYDROcodone-acetaminophen (NORCO) 5-325 MG tablet Take 1-2 tablets by mouth every 6 (six) hours as needed for moderate pain or severe pain. 20 tablet 0   iron polysaccharides (NU-IRON) 150 MG capsule Take 1 capsule (150 mg total) by mouth daily. (Patient taking differently: Take 150 mg by mouth every other day.) 90 capsule 1   lidocaine-prilocaine (EMLA) cream Apply to affected area once 30 g 3    metoprolol succinate (TOPROL-XL) 25 MG 24 hr tablet TAKE 1 TABLET(25 MG) BY MOUTH DAILY 30 tablet 3   ondansetron (ZOFRAN) 8 MG tablet Take 1 tablet (8 mg total) by mouth every 8 (eight) hours as needed for nausea or vomiting. 30 tablet 1   prochlorperazine (COMPAZINE) 10 MG tablet Take 1 tablet (10 mg total) by mouth every 6 (six) hours as needed for nausea or vomiting. 30 tablet 1   telmisartan (MICARDIS) 20 MG tablet TAKE 1 TABLET(20 MG) BY MOUTH DAILY 30 tablet 5   No current facility-administered medications for this visit.    HISTORY OF PRESENT ILLNESS:   Oncology History  Clear cell carcinoma of kidney (HCC)  07/16/2022 Initial Diagnosis   Clear cell carcinoma of kidney (HCC)   08/11/2022 -  Chemotherapy   Patient is on Treatment Plan : RENAL Pembrolizumab (200) q21d         REVIEW OF SYSTEMS:   Constitutional: Denies fevers, chills or abnormal weight loss Eyes: Denies blurriness of vision Ears, nose, mouth, throat, and face: Denies mucositis or sore throat Respiratory: Denies cough, dyspnea or wheezes Cardiovascular: Denies palpitation, chest discomfort or lower extremity swelling Gastrointestinal:  Denies nausea, heartburn or change in bowel habits Skin: Denies abnormal skin rashes Lymphatics: Denies new lymphadenopathy or easy bruising Neurological:Denies numbness, tingling or new weaknesses Behavioral/Psych: Mood is stable, no new changes  All other systems were reviewed with the patient and are negative.   VITALS:   Today's Vitals   09/01/22 1112 09/01/22 1120  BP: (!) 143/95   Pulse: 78   Resp: 20   Temp: 98 F (36.7 C)   Weight: (!) 310 lb 12.8 oz (141 kg)   PainSc:  0-No pain   Body mass index is 37.83 kg/m.   Wt Readings from Last 3 Encounters:  09/01/22 (!) 310 lb 12.8 oz (141 kg)  08/11/22 (!) 306 lb (138.8 kg)  07/16/22 298 lb (135.2 kg)    Body mass index is 37.83 kg/m.  Performance status (ECOG): 0 - Asymptomatic  PHYSICAL EXAM:    GENERAL:alert, no distress and comfortable SKIN: skin color, texture, turgor are normal, no rashes or significant lesions EYES: normal, Conjunctiva are pink and non-injected, sclera clear OROPHARYNX:no exudate, no erythema and lips, buccal mucosa, and tongue normal  NECK: supple, thyroid normal size, non-tender, without nodularity LYMPH:  no palpable lymphadenopathy in the cervical, axillary or inguinal LUNGS: clear to auscultation and percussion with normal breathing effort HEART: regular rate & rhythm and no murmurs and no lower extremity edema ABDOMEN:abdomen soft, non-tender and normal bowel sounds Musculoskeletal:no cyanosis of digits and no clubbing  NEURO: alert & oriented x 3 with fluent speech, no focal motor/sensory deficits  LABORATORY DATA:  I have reviewed the data as listed    Component Value Date/Time   NA 139 09/01/2022 1052  K 4.6 09/01/2022 1052   CL 105 09/01/2022 1052   CO2 29 09/01/2022 1052   GLUCOSE 86 09/01/2022 1052   BUN 21 (H) 09/01/2022 1052   CREATININE 1.51 (H) 09/01/2022 1052   CALCIUM 9.9 09/01/2022 1052   PROT 8.1 09/01/2022 1052   ALBUMIN 4.1 09/01/2022 1052   AST 18 09/01/2022 1052   ALT 16 09/01/2022 1052   ALKPHOS 74 09/01/2022 1052   BILITOT 0.5 09/01/2022 1052   GFRNONAA 58 (L) 09/01/2022 1052   GFRAA >60 02/23/2019 1406    Lab Results  Component Value Date   WBC 5.3 09/01/2022   NEUTROABS 3.4 09/01/2022   HGB 11.2 (L) 09/01/2022   HCT 34.4 (L) 09/01/2022   MCV 83.9 09/01/2022   PLT 272 09/01/2022   Addendum I have seen the patient, examined him. I agree with the assessment and and plan and have edited the notes.   Pt is here for cycle 2 Keytruda for his kidney cancer.  Chart reviewed.  He tolerated first cycle very well.  I again reviewed potential side effects and the management of.  He knows to call us if he has any new concerns of treatment.  Lab reviewed, adequate for treatment, will proceed today.  All questions were  answered.  I spent a total of 25 minutes for his visit today, more than 50% time on face-to-face counseling.  Malachy Mood MD 09/01/2022

## 2022-09-01 NOTE — Progress Notes (Signed)
OK to treat today with Cr 1.51 per Dr. Mosetta Putt.

## 2022-09-01 NOTE — Patient Instructions (Signed)

## 2022-09-01 NOTE — Assessment & Plan Note (Addendum)
09/01/2022 - presents for Cycle 2 day 1 keytruda.  -tolerated cycle 1 well without negative side effects.  -states he is doing well and energy levels are at baseline. He denies any appetite changes or weight loss. He denies nausea, vomiting or bowel habit changes. He denies easy bruising or signs of active bleeding. He denies fevers, chills, sweats, shortness of breath, chest pain or cough. He has no other complaints.  Labs, follow up, treatment in 21 days

## 2022-09-02 ENCOUNTER — Encounter: Payer: Self-pay | Admitting: Hematology and Oncology

## 2022-09-08 ENCOUNTER — Other Ambulatory Visit: Payer: Self-pay

## 2022-09-08 ENCOUNTER — Encounter: Payer: Self-pay | Admitting: Internal Medicine

## 2022-09-08 MED ORDER — POLYSACCHARIDE IRON COMPLEX 150 MG PO CAPS: 150.0000 mg | ORAL_CAPSULE | Freq: Every day | ORAL | 1 refills | Status: DC

## 2022-09-22 ENCOUNTER — Inpatient Hospital Stay (HOSPITAL_BASED_OUTPATIENT_CLINIC_OR_DEPARTMENT_OTHER): Payer: BC Managed Care – PPO | Admitting: Physician Assistant

## 2022-09-22 ENCOUNTER — Inpatient Hospital Stay: Payer: BC Managed Care – PPO

## 2022-09-22 ENCOUNTER — Inpatient Hospital Stay: Payer: BC Managed Care – PPO | Attending: Hematology and Oncology

## 2022-09-22 VITALS — HR 86

## 2022-09-22 VITALS — BP 147/83 | HR 101 | Temp 97.9°F | Resp 20 | Wt 318.2 lb

## 2022-09-22 DIAGNOSIS — Z87891 Personal history of nicotine dependence: Secondary | ICD-10-CM | POA: Diagnosis not present

## 2022-09-22 DIAGNOSIS — C641 Malignant neoplasm of right kidney, except renal pelvis: Secondary | ICD-10-CM

## 2022-09-22 DIAGNOSIS — I2699 Other pulmonary embolism without acute cor pulmonale: Secondary | ICD-10-CM

## 2022-09-22 DIAGNOSIS — Z5112 Encounter for antineoplastic immunotherapy: Secondary | ICD-10-CM | POA: Diagnosis not present

## 2022-09-22 DIAGNOSIS — Z905 Acquired absence of kidney: Secondary | ICD-10-CM | POA: Diagnosis not present

## 2022-09-22 DIAGNOSIS — Z79620 Long term (current) use of immunosuppressive biologic: Secondary | ICD-10-CM | POA: Insufficient documentation

## 2022-09-22 LAB — CBC WITH DIFFERENTIAL (CANCER CENTER ONLY)
Abs Immature Granulocytes: 0.01 10*3/uL (ref 0.00–0.07)
Basophils Absolute: 0 10*3/uL (ref 0.0–0.1)
Basophils Relative: 1 %
Eosinophils Absolute: 1 10*3/uL — ABNORMAL HIGH (ref 0.0–0.5)
Eosinophils Relative: 16 %
HCT: 33.8 % — ABNORMAL LOW (ref 39.0–52.0)
Hemoglobin: 10.5 g/dL — ABNORMAL LOW (ref 13.0–17.0)
Immature Granulocytes: 0 %
Lymphocytes Relative: 22 %
Lymphs Abs: 1.3 10*3/uL (ref 0.7–4.0)
MCH: 26.9 pg (ref 26.0–34.0)
MCHC: 31.1 g/dL (ref 30.0–36.0)
MCV: 86.7 fL (ref 80.0–100.0)
Monocytes Absolute: 0.4 10*3/uL (ref 0.1–1.0)
Monocytes Relative: 6 %
Neutro Abs: 3.3 10*3/uL (ref 1.7–7.7)
Neutrophils Relative %: 55 %
Platelet Count: 232 10*3/uL (ref 150–400)
RBC: 3.9 MIL/uL — ABNORMAL LOW (ref 4.22–5.81)
RDW: 17 % — ABNORMAL HIGH (ref 11.5–15.5)
WBC Count: 6.1 10*3/uL (ref 4.0–10.5)
nRBC: 0 % (ref 0.0–0.2)

## 2022-09-22 LAB — CMP (CANCER CENTER ONLY)
ALT: 27 U/L (ref 0–44)
AST: 34 U/L (ref 15–41)
Albumin: 4.2 g/dL (ref 3.5–5.0)
Alkaline Phosphatase: 95 U/L (ref 38–126)
Anion gap: 6 (ref 5–15)
BUN: 19 mg/dL (ref 6–20)
CO2: 27 mmol/L (ref 22–32)
Calcium: 9.8 mg/dL (ref 8.9–10.3)
Chloride: 107 mmol/L (ref 98–111)
Creatinine: 1.69 mg/dL — ABNORMAL HIGH (ref 0.61–1.24)
GFR, Estimated: 51 mL/min — ABNORMAL LOW (ref >60)
Glucose, Bld: 94 mg/dL (ref 70–99)
Potassium: 4.3 mmol/L (ref 3.5–5.1)
Sodium: 140 mmol/L (ref 135–145)
Total Bilirubin: 0.5 mg/dL (ref 0.3–1.2)
Total Protein: 8.2 g/dL — ABNORMAL HIGH (ref 6.5–8.1)

## 2022-09-22 LAB — TSH: TSH: 2.286 u[IU]/mL (ref 0.350–4.500)

## 2022-09-22 MED ORDER — SODIUM CHLORIDE 0.9 % IV SOLN: Freq: Once | INTRAVENOUS | Status: AC

## 2022-09-22 MED ORDER — SODIUM CHLORIDE 0.9 % IV SOLN
200.0000 mg | Freq: Once | INTRAVENOUS | Status: AC
  Administered 2022-09-22: 200 mg via INTRAVENOUS
  Filled 2022-09-22: qty 200

## 2022-09-22 NOTE — Patient Instructions (Addendum)
[place] [facility]  Discharge Instructions: Thank you for choosing [facility] to provide your oncology and hematology care.   If you have a lab appointment with the Cancer Center, please go directly to the Cancer Center and check in at the registration area.   Wear comfortable clothing and clothing appropriate for easy access to any Portacath or PICC line.   We strive to give you quality time with your provider. You may need to reschedule your appointment if you arrive late (15 or more minutes).  Arriving late affects you and other patients whose appointments are after yours.  Also, if you miss three or more appointments without notifying the office, you may be dismissed from the clinic at the provider's discretion.      For prescription refill requests, have your pharmacy contact our office and allow 72 hours for refills to be completed.    Today you received the following chemotherapy and/or immunotherapy agents: Keytruda      To help prevent nausea and vomiting after your treatment, we encourage you to take your nausea medication as directed.  BELOW ARE SYMPTOMS THAT SHOULD BE REPORTED IMMEDIATELY: *FEVER GREATER THAN 100.4 F (38 C) OR HIGHER *CHILLS OR SWEATING *NAUSEA AND VOMITING THAT IS NOT CONTROLLED WITH YOUR NAUSEA MEDICATION *UNUSUAL SHORTNESS OF BREATH *UNUSUAL BRUISING OR BLEEDING *URINARY PROBLEMS (pain or burning when urinating, or frequent urination) *BOWEL PROBLEMS (unusual diarrhea, constipation, pain near the anus) TENDERNESS IN MOUTH AND THROAT WITH OR WITHOUT PRESENCE OF ULCERS (sore throat, sores in mouth, or a toothache) UNUSUAL RASH, SWELLING OR PAIN  UNUSUAL VAGINAL DISCHARGE OR ITCHING   Items with * indicate a potential emergency and should be followed up as soon as possible or go to the Emergency Department if any problems should occur.  Please show the CHEMOTHERAPY ALERT CARD or IMMUNOTHERAPY ALERT CARD at  check-in to the Emergency Department and triage nurse.  Should you have questions after your visit or need to cancel or reschedule your appointment, please contact [facility] [place]  Dept: [phone]  and follow the prompts.  Office hours are 8:00 a.m. to 4:30 p.m. Monday - Friday. Please note that voicemails left after 4:00 p.m. may not be returned until the following business day.  We are closed weekends and major holidays. You have access to a nurse at all times for urgent questions. Please call the main number to the clinic Dept: [phone] and follow the prompts.   For any non-urgent questions, you may also contact your provider using MyChart. We now offer e-Visits for anyone 76 and older to request care online for non-urgent symptoms. For details visit [email or web address].   Also download the MyChart app! Go to the app store, search "MyChart", open the app, select [place], and log in with your MyChart username and password.

## 2022-09-22 NOTE — Progress Notes (Signed)
[facility] PROGRESS NOTE  Patient Care Team: Corwin Levins, MD as PCP - General  CHIEF COMPLAINTS/PURPOSE OF CONSULTATION:  Clear cell renal cell carcinoma  Oncology History  Clear cell carcinoma of kidney (HCC)  07/16/2022 Initial Diagnosis   Clear cell carcinoma of kidney (HCC)   08/11/2022 -  Chemotherapy   Patient is on Treatment Plan : RENAL Pembrolizumab (200) q21d       CURRENT TREATMENT: Single agent pembrolizumab  INTERIM HISTORY:  Marcus Burns 42 y.o. male returns for a follow up for clear-cell renal carcinoma. He presents today to start Cycle 3 of pembrolizumab.   Marcus Burns is tolerating pembrolizumab therapy without any new or concerning toxicities. His appetite and energy levels are stable. He denies nausea, vomiting or bowel habit changes. He denies bruising or signs of active bleeding. He denies fevers, chills, sweats, shortness of breath, chest pain or cough. He has no other complaints.  REVIEW OF SYSTEMS:   Constitutional: Negative for appetite change, fatigue, chills, fever and unexpected weight change HENT: Negative for mouth sores, nosebleeds, sore throat and trouble swallowing.   Eyes: Negative for eye problems and icterus.  Respiratory: Negative for cough, hemoptysis, shortness of breath and wheezing.   Cardiovascular: Negative for chest pain and leg swelling.  Gastrointestinal: Negative for abdominal pain, constipation, diarrhea, nausea and vomiting.  Genitourinary: Negative for bladder incontinence, difficulty urinating, dysuria, frequency and hematuria.   Musculoskeletal: Negative for back pain, gait problem, neck pain and neck stiffness.  Skin:Negative for rash and ulcers Neurological: Negative for dizziness, extremity weakness, gait problem, headaches, light-headedness and seizures.  Hematological: Negative for adenopathy. Does not bruise/bleed easily.  Psychiatric/Behavioral: Negative for confusion, depression and sleep  disturbance. The patient is not nervous/anxious.    MEDICAL HISTORY:  Past Medical History:  Diagnosis Date   Acute prostatitis 06/19/2007   ALLERGIC RHINITIS 06/19/2007   ANXIETY 06/19/2007   Cancer (HCC)    kidney   ELEVATED BLOOD PRESSURE WITHOUT DIAGNOSIS OF HYPERTENSION 06/19/2007   GERD 06/19/2007   GERD (gastroesophageal reflux disease)    History of COVID-19 01/24/2019   History of kidney stones    Hypertension    Inguinal hernia    Migraines    NEPHROLITHIASIS, HX OF 06/19/2007   PE (pulmonary thromboembolism) (HCC)     SURGICAL HISTORY: Past Surgical History:  Procedure Laterality Date   CYSTOSCOPY WITH RETROGRADE PYELOGRAM, URETEROSCOPY AND STENT PLACEMENT Bilateral 02/09/2019   Procedure: CYSTOSCOPY WITH RETROGRADE PYELOGRAM, DIAGNOSTIC URETEROSCOPY AND STENT PLACEMENT;  Surgeon: Sebastian Ache, MD;  Location: [facility];  Service: Urology;  Laterality: Bilateral;  75 MINS   CYSTOSCOPY WITH RETROGRADE PYELOGRAM, URETEROSCOPY AND STENT PLACEMENT Bilateral 02/23/2019   Procedure: CYSTOSCOPY WITH RETROGRADE PYELOGRAM, URETEROSCOPY AND STENT PLACEMENT;  Surgeon: Sebastian Ache, MD;  Location: [facility];  Service: Urology;  Laterality: Bilateral;  1 HR   HERNIA REPAIR     HOLMIUM LASER APPLICATION Bilateral 02/23/2019   Procedure: HOLMIUM LASER APPLICATION;  Surgeon: Sebastian Ache, MD;  Location: [facility];  Service: Urology;  Laterality: Bilateral;   ROBOT ASSISTED LAPAROSCOPIC NEPHRECTOMY Right 06/18/2022   Procedure: XI ROBOTIC ASSISTED LAPAROSCOPIC RADICAL NEPHRECTOMY AND PERICAVAL LYMPH NODE DISSECTION;  Surgeon: Loletta Parish., MD;  Location: [facility];  Service: Urology;  Laterality: Right;  3 HRS    SOCIAL HISTORY: Social History   Socioeconomic History   Marital status: Married    Spouse name: Not on file   Number of children: Not on file   Years  of education: Not on file   Highest education level: Not on file  Occupational History   Not on file  Tobacco Use    Smoking status: Former   Smokeless tobacco: Former  Advertising account planner   Vaping status: Never Used  Substance and Sexual Activity   Alcohol use: No   Drug use: No   Sexual activity: Not on file  Other Topics Concern   Not on file  Social History Narrative   Not on file   Social Determinants of Health   Financial Resource Strain: Not on file  Food Insecurity: No Food Insecurity (06/19/2022)   Hunger Vital Sign    Worried About Running Out of Food in the Last Year: Never true    Ran Out of Food in the Last Year: Never true  Transportation Needs: No Transportation Needs (06/19/2022)   PRAPARE - Administrator, Civil Service (Medical): No    Lack of Transportation (Non-Medical): No  Physical Activity: Not on file  Stress: Not on file  Social Connections: Not on file  Intimate Partner Violence: Not At Risk (06/19/2022)   Humiliation, Afraid, Rape, and Kick questionnaire    Fear of Current or Ex-Partner: No    Emotionally Abused: No    Physically Abused: No    Sexually Abused: No    FAMILY HISTORY: Family History  Problem Relation Age of Onset   Coronary artery disease Father    Anxiety disorder Mother    Anxiety disorder Brother    Diabetes Brother     ALLERGIES:  is allergic to amlodipine and doxycycline.  MEDICATIONS:  Current Outpatient Medications  Medication Sig Dispense Refill   acetaminophen (TYLENOL) 500 MG tablet Take 1,000 mg by mouth every 6 (six) hours as needed (pain.).     albuterol (VENTOLIN HFA) 108 (90 Base) MCG/ACT inhaler Inhale 2 puffs into the lungs every 6 (six) hours as needed for wheezing or shortness of breath. 8 g 1   apixaban (ELIQUIS) 5 MG TABS tablet Take 1 tablet (5 mg total) by mouth 2 (two) times daily. 60 tablet 6   APPLE CIDER VINEGAR PO Take 15 mLs by mouth daily.     cetirizine (ZYRTEC) 10 MG tablet Take 10 mg by mouth daily.     cholecalciferol (VITAMIN D3) 25 MCG (1000 UNIT) tablet Take 1,000 Units by mouth daily.     clonazePAM  (KLONOPIN) 0.5 MG tablet Take 1 tablet (0.5 mg total) by mouth 2 (two) times daily as needed for anxiety. 60 tablet 1   docusate sodium (COLACE) 100 MG capsule Take 1 capsule (100 mg total) by mouth 2 (two) times daily.     HYDROcodone-acetaminophen (NORCO) 5-325 MG tablet Take 1-2 tablets by mouth every 6 (six) hours as needed for moderate pain or severe pain. 20 tablet 0   iron polysaccharides (NU-IRON) 150 MG capsule Take 1 capsule (150 mg total) by mouth daily. 90 capsule 1   lidocaine-prilocaine (EMLA) cream Apply to affected area once 30 g 3   metoprolol succinate (TOPROL-XL) 25 MG 24 hr tablet TAKE 1 TABLET(25 MG) BY MOUTH DAILY 30 tablet 3   ondansetron (ZOFRAN) 8 MG tablet Take 1 tablet (8 mg total) by mouth every 8 (eight) hours as needed for nausea or vomiting. 30 tablet 1   prochlorperazine (COMPAZINE) 10 MG tablet Take 1 tablet (10 mg total) by mouth every 6 (six) hours as needed for nausea or vomiting. 30 tablet 1   telmisartan (MICARDIS) 20 MG tablet TAKE  1 TABLET(20 MG) BY MOUTH DAILY 30 tablet 5   No current facility-administered medications for this visit.     PHYSICAL EXAMINATION: ECOG PERFORMANCE STATUS: 0 - Asymptomatic  Vitals:   09/22/22 1018  BP: (!) 147/83  Pulse: (!) 101  Resp: 20  Temp: 97.9 F (36.6 C)  SpO2: 100%    Filed Weights   09/22/22 1018  Weight: (!) 318 lb 3.2 oz (144.3 kg)   Constitutional: Oriented to person, place, and time and well-developed, well-nourished, and in no distress.  HENT:  Head: Normocephalic and atraumatic.  Eyes: Conjunctivae are normal. Right eye exhibits no discharge. Left eye exhibits no discharge. No scleral icterus.  Cardiovascular: Normal rate, regular rhythm, normal heart sounds  Pulmonary/Chest: Effort normal and breath sounds normal. No respiratory distress. No wheezes. No rales.  Musculoskeletal: Normal range of motion. Exhibits no edema.  Neurological: Alert and oriented to person, place, and time. Exhibits  normal muscle tone. Gait normal. Coordination normal.  Skin: Skin is warm and dry. No rash noted. Not diaphoretic. No erythema. No pallor.  Psychiatric: Mood, memory and judgment normal.   LABORATORY DATA:  I have reviewed the data as listed Lab Results  Component Value Date   WBC 6.1 09/22/2022   HGB 10.5 (L) 09/22/2022   HCT 33.8 (L) 09/22/2022   MCV 86.7 09/22/2022   PLT 232 09/22/2022     Chemistry      Component Value Date/Time   NA 140 09/22/2022 1003   K 4.3 09/22/2022 1003   CL 107 09/22/2022 1003   CO2 27 09/22/2022 1003   BUN 19 09/22/2022 1003   CREATININE 1.69 (H) 09/22/2022 1003      Component Value Date/Time   CALCIUM 9.8 09/22/2022 1003   ALKPHOS 95 09/22/2022 1003   AST 34 09/22/2022 1003   ALT 27 09/22/2022 1003   BILITOT 0.5 09/22/2022 1003     PATHOLOGY:  SURGICAL PATHOLOGY * THIS IS AN ADDENDUM REPORT ** CASE: [id number] PATIENT: Marcus Burns Surgical Pathology Report   Reason for Addendum #1:  Immunohistochemistry results  Clinical History: Right renal mass (crm)     FINAL MICROSCOPIC DIAGNOSIS:  A. KIDNEY, RIGHT AND PERICAVAL LYMPH NODES, RADICAL NEPHRECTOMY:      Clear cell renal cell carcinoma, WHO / ISUP grade 3 (of 4).      Tumor size: 14.5 x 12 x 8.5 cm.      Tumor necrosis identified accounting for approximately 15% of parenchyma.      Carcinoma disrupts capsule and invades into perinephric tissue, adrenal parenchyma, renal vein and renal sinus.      Renal vein margin is positive for carcinoma. Ureteral and vascular margins are negative for carcinoma. Separated tumor satellite focus (0.5 cm). Background renal parenchyma with focal interstitial inflammation without glomerulosclerosis, tubal atrophy or vasculopathy. See oncology table.  ONCOLOGY TABLE:  KIDNEY: Nephrectomy  Procedure: Nephrectomy Specimen Laterality: Right Tumor Size: 14.5 x 12 x 8.5 cm Tumor Focality: One focus of mail tumor (14.5 cm) with one  fucus of tumor satellite nodule (0.5 cm) Histologic Type: Clear cell renal cell carcinoma Sarcomatoid Features: Not identified Rhabdoid Features: Not identified Histologic Grade: Grade 3 Tumor Necrosis: present, 15% of the parenchymal volume Tumor Extension: and invades into perinephric tissue, adrenal parenchyma, renal vein and renal sinus. Lymphatic and/or Vascular Invasion:  Not identified Margins: Renal vein margin is positive for tumor Regional Lymph Nodes:      Number of Lymph Nodes with Tumor: 0  Number of Lymph Nodes Examined: 1 Distant Metastasis:      Distant Site(s) Involved: Not applicable Additional Findings in Nonneoplastic Kidney: Focal interstitial inflammation without glomerulosclerosis, tubal atrophy or vasculopathy Pathologic Stage Classification (pTNM, AJCC 8th Edition): pT4, pN0 Ancillary Studies: Can be performed upon request   RADIOGRAPHIC STUDIES: I have personally reviewed the radiological images as listed and agreed with the findings in the report. No results found.  ASSESSMENT & PLAN:  Marcus Burns is a 42 y.o. male who presents for a follow up for clear cell renal carcinoma.   #T4N0 Clear Cell Renal Cell Carcinoma: --Underwent right robotic radical nephrectomy with retroperitoneal lymph node dissection on 06/18/2022. Pathology revealed clear cell renal cell carcinoma. Tumor invades into perinephric tissue, adrenal parenchyma, renal vein and renal sinus. Renal vein margin is positive for tumor.  --Post op CT CAP from 06/21/2022 did not revealed any obvious signs of metastatic disease.  --Recommendation is single agent pembrolizumab, started on 08/11/2022. PLAN: --Due for Cycle 3, Day 1 of of Pembrolizumab.  --Labs were reviewed and adequate for treamtent. WBC 6.1, Hgb 10.5, Plt 232K, Creatinine overall stable at 1.69. LFTs normal. Thyroid panel pending.  --Proceed with treatment today without any modifications.  --RTC in 3 weeks with labs and  follow up before Cycle 4, Day 1.  #Pulmonary emboli with right heart strain: --Found on CTA chest from 03/11/2022.   --Currently on Eliquis 5 mg PO twice daily   All questions were answered. The patient knows to call the clinic with any problems, questions or concerns.  I have spent a total of 30 minutes minutes of face-to-face and non-face-to-face time, preparing to see the patient, obtaining and/or reviewing separately obtained history, performing a medically appropriate examination, counseling and educating the patient, documenting clinical information in the electronic health record, independently interpreting results and communicating results to the patient, and care coordination.   Georga Kaufmann PA-C Dept of Hematology and Oncology [facility] Phone: [phone]

## 2022-09-23 LAB — T4: T4, Total: 9.8 ug/dL (ref 4.5–12.0)

## 2022-09-24 ENCOUNTER — Encounter: Payer: Self-pay | Admitting: Hematology and Oncology

## 2022-09-24 ENCOUNTER — Ambulatory Visit: Payer: 59 | Admitting: Internal Medicine

## 2022-10-12 ENCOUNTER — Emergency Department (HOSPITAL_COMMUNITY): Payer: BC Managed Care – PPO

## 2022-10-12 ENCOUNTER — Inpatient Hospital Stay (HOSPITAL_COMMUNITY)
Admission: EM | Admit: 2022-10-12 | Discharge: 2022-10-14 | DRG: 375 | Disposition: A | Discharge status: Home / Self Care | Payer: BC Managed Care – PPO | Attending: Family Medicine | Admitting: Family Medicine

## 2022-10-12 DIAGNOSIS — Z7951 Long term (current) use of inhaled steroids: Secondary | ICD-10-CM

## 2022-10-12 DIAGNOSIS — F419 Anxiety disorder, unspecified: Secondary | ICD-10-CM | POA: Diagnosis present

## 2022-10-12 DIAGNOSIS — K219 Gastro-esophageal reflux disease without esophagitis: Secondary | ICD-10-CM | POA: Diagnosis present

## 2022-10-12 DIAGNOSIS — N179 Acute kidney failure, unspecified: Secondary | ICD-10-CM | POA: Diagnosis present

## 2022-10-12 DIAGNOSIS — Z23 Encounter for immunization: Secondary | ICD-10-CM

## 2022-10-12 DIAGNOSIS — J309 Allergic rhinitis, unspecified: Secondary | ICD-10-CM | POA: Diagnosis present

## 2022-10-12 DIAGNOSIS — Z79899 Other long term (current) drug therapy: Secondary | ICD-10-CM

## 2022-10-12 DIAGNOSIS — I10 Essential (primary) hypertension: Secondary | ICD-10-CM | POA: Diagnosis present

## 2022-10-12 DIAGNOSIS — Z818 Family history of other mental and behavioral disorders: Secondary | ICD-10-CM

## 2022-10-12 DIAGNOSIS — Z9221 Personal history of antineoplastic chemotherapy: Secondary | ICD-10-CM

## 2022-10-12 DIAGNOSIS — C786 Secondary malignant neoplasm of retroperitoneum and peritoneum: Secondary | ICD-10-CM | POA: Diagnosis not present

## 2022-10-12 DIAGNOSIS — C799 Secondary malignant neoplasm of unspecified site: Secondary | ICD-10-CM | POA: Diagnosis present

## 2022-10-12 DIAGNOSIS — Z85528 Personal history of other malignant neoplasm of kidney: Secondary | ICD-10-CM

## 2022-10-12 DIAGNOSIS — Z87891 Personal history of nicotine dependence: Secondary | ICD-10-CM

## 2022-10-12 DIAGNOSIS — I2699 Other pulmonary embolism without acute cor pulmonale: Secondary | ICD-10-CM | POA: Diagnosis present

## 2022-10-12 DIAGNOSIS — N2889 Other specified disorders of kidney and ureter: Secondary | ICD-10-CM

## 2022-10-12 DIAGNOSIS — Z79891 Long term (current) use of opiate analgesic: Secondary | ICD-10-CM

## 2022-10-12 DIAGNOSIS — C787 Secondary malignant neoplasm of liver and intrahepatic bile duct: Secondary | ICD-10-CM

## 2022-10-12 DIAGNOSIS — C649 Malignant neoplasm of unspecified kidney, except renal pelvis: Secondary | ICD-10-CM | POA: Diagnosis present

## 2022-10-12 DIAGNOSIS — N1831 Chronic kidney disease, stage 3a: Secondary | ICD-10-CM | POA: Diagnosis present

## 2022-10-12 DIAGNOSIS — C7802 Secondary malignant neoplasm of left lung: Secondary | ICD-10-CM | POA: Diagnosis present

## 2022-10-12 DIAGNOSIS — R109 Unspecified abdominal pain: Secondary | ICD-10-CM | POA: Diagnosis not present

## 2022-10-12 DIAGNOSIS — Z86711 Personal history of pulmonary embolism: Secondary | ICD-10-CM | POA: Diagnosis present

## 2022-10-12 DIAGNOSIS — I129 Hypertensive chronic kidney disease with stage 1 through stage 4 chronic kidney disease, or unspecified chronic kidney disease: Secondary | ICD-10-CM | POA: Diagnosis present

## 2022-10-12 DIAGNOSIS — C641 Malignant neoplasm of right kidney, except renal pelvis: Secondary | ICD-10-CM

## 2022-10-12 DIAGNOSIS — Z8616 Personal history of COVID-19: Secondary | ICD-10-CM

## 2022-10-12 DIAGNOSIS — Z905 Acquired absence of kidney: Secondary | ICD-10-CM

## 2022-10-12 DIAGNOSIS — Z8249 Family history of ischemic heart disease and other diseases of the circulatory system: Secondary | ICD-10-CM

## 2022-10-12 DIAGNOSIS — Z881 Allergy status to other antibiotic agents status: Secondary | ICD-10-CM

## 2022-10-12 DIAGNOSIS — Z7901 Long term (current) use of anticoagulants: Secondary | ICD-10-CM

## 2022-10-12 LAB — URINALYSIS, W/ REFLEX TO CULTURE (INFECTION SUSPECTED)
Bacteria, UA: NONE SEEN
Bilirubin Urine: NEGATIVE
Glucose, UA: NEGATIVE mg/dL
Hgb urine dipstick: NEGATIVE
Ketones, ur: NEGATIVE mg/dL
Leukocytes,Ua: NEGATIVE
Nitrite: NEGATIVE
Protein, ur: 100 mg/dL — AB
Specific Gravity, Urine: 1.029 (ref 1.005–1.030)
pH: 5 (ref 5.0–8.0)

## 2022-10-12 LAB — COMPREHENSIVE METABOLIC PANEL
ALT: 60 U/L — ABNORMAL HIGH (ref 0–44)
AST: 86 U/L — ABNORMAL HIGH (ref 15–41)
Albumin: 3.8 g/dL (ref 3.5–5.0)
Alkaline Phosphatase: 129 U/L — ABNORMAL HIGH (ref 38–126)
Anion gap: 9 (ref 5–15)
BUN: 29 mg/dL — ABNORMAL HIGH (ref 6–20)
CO2: 21 mmol/L — ABNORMAL LOW (ref 22–32)
Calcium: 9.2 mg/dL (ref 8.9–10.3)
Chloride: 108 mmol/L (ref 98–111)
Creatinine, Ser: 2.4 mg/dL — ABNORMAL HIGH (ref 0.61–1.24)
GFR, Estimated: 33 mL/min — ABNORMAL LOW (ref >60)
Glucose, Bld: 100 mg/dL — ABNORMAL HIGH (ref 70–99)
Potassium: 4.3 mmol/L (ref 3.5–5.1)
Sodium: 138 mmol/L (ref 135–145)
Total Bilirubin: 0.8 mg/dL (ref 0.3–1.2)
Total Protein: 8 g/dL (ref 6.5–8.1)

## 2022-10-12 LAB — CBC
HCT: 30.3 % — ABNORMAL LOW (ref 39.0–52.0)
Hemoglobin: 9.2 g/dL — ABNORMAL LOW (ref 13.0–17.0)
MCH: 26.9 pg (ref 26.0–34.0)
MCHC: 30.4 g/dL (ref 30.0–36.0)
MCV: 88.6 fL (ref 80.0–100.0)
Platelets: 284 10*3/uL (ref 150–400)
RBC: 3.42 MIL/uL — ABNORMAL LOW (ref 4.22–5.81)
RDW: 16.5 % — ABNORMAL HIGH (ref 11.5–15.5)
WBC: 7 10*3/uL (ref 4.0–10.5)
nRBC: 0 % (ref 0.0–0.2)

## 2022-10-12 MED ORDER — LACTATED RINGERS IV BOLUS
1000.0000 mL | Freq: Once | INTRAVENOUS | Status: AC
  Administered 2022-10-12: 1000 mL via INTRAVENOUS

## 2022-10-12 MED ORDER — ONDANSETRON HCL 4 MG/2ML IJ SOLN
4.0000 mg | Freq: Once | INTRAMUSCULAR | Status: AC
  Administered 2022-10-12: 4 mg via INTRAVENOUS
  Filled 2022-10-12: qty 2

## 2022-10-12 MED ORDER — IOHEXOL 300 MG/ML  SOLN
100.0000 mL | Freq: Once | INTRAMUSCULAR | Status: AC | PRN
  Administered 2022-10-12: 100 mL via INTRAVENOUS

## 2022-10-12 MED ORDER — HYDROMORPHONE HCL 1 MG/ML IJ SOLN
1.0000 mg | Freq: Once | INTRAMUSCULAR | Status: AC
  Administered 2022-10-12: 1 mg via INTRAVENOUS
  Filled 2022-10-12: qty 1

## 2022-10-12 NOTE — Progress Notes (Signed)
FMLA forms completed for Spouse as requested by Patient. Fax transmission confirmation received. Copy of forms placed for pick-up as requested. No other needs or concerns noted at this time.

## 2022-10-12 NOTE — ED Provider Notes (Addendum)
[place] [facility] AT [facility] Provider Note   CSN: [id number] Arrival date & time: 10/12/22  2042     History  Chief Complaint  Patient presents with   Flank Pain    Marcus Burns is a 42 y.o. male.  This is a 42 year old male here today for right sided flank pain.  Patient has a past medical history significant for right sided renal cancer status post right robotic nephrectomy, partial hepatic resection in June of this year.  Patient also history of pulmonary emboli, currently takes Eliquis.  Receives immunotherapy, pembrolizumab.  Scheduled to get immunotherapy tomorrow.   Patient has been compliant with his medications, says that beginning 2 days ago he began to experience pain on his right side which felt similar to when he had had his previous renal mass.  He has not had fever, chills, shortness of breath.  Patient has not noticed anything that seems to make it better or worse.   Flank Pain       Home Medications Prior to Admission medications   Medication Sig Start Date End Date Taking? Authorizing Provider  acetaminophen (TYLENOL) 500 MG tablet Take 1,000 mg by mouth every 6 (six) hours as needed (pain.).    [provider]  albuterol (VENTOLIN HFA) 108 (90 Base) MCG/ACT inhaler Inhale 2 puffs into the lungs every 6 (six) hours as needed for wheezing or shortness of breath. 12/24/21   Corwin Levins, MD  apixaban (ELIQUIS) 5 MG TABS tablet Take 1 tablet (5 mg total) by mouth 2 (two) times daily. 06/10/22   Rachel Moulds, MD  APPLE CIDER VINEGAR PO Take 15 mLs by mouth daily.    [provider]  cetirizine (ZYRTEC) 10 MG tablet Take 10 mg by mouth daily.    [provider]  cholecalciferol (VITAMIN D3) 25 MCG (1000 UNIT) tablet Take 1,000 Units by mouth daily.    [provider]  clonazePAM (KLONOPIN) 0.5 MG tablet Take 1 tablet (0.5 mg total) by mouth 2 (two) times daily as needed for anxiety. 07/13/22    Corwin Levins, MD  docusate sodium (COLACE) 100 MG capsule Take 1 capsule (100 mg total) by mouth 2 (two) times daily. 06/18/22   Harrie Foreman, PA-C  HYDROcodone-acetaminophen (NORCO) 5-325 MG tablet Take 1-2 tablets by mouth every 6 (six) hours as needed for moderate pain or severe pain. 06/18/22   Harrie Foreman, PA-C  iron polysaccharides (NU-IRON) 150 MG capsule Take 1 capsule (150 mg total) by mouth daily. 09/08/22   Corwin Levins, MD  lidocaine-prilocaine (EMLA) cream Apply to affected area once 07/16/22   Rachel Moulds, MD  metoprolol succinate (TOPROL-XL) 25 MG 24 hr tablet TAKE 1 TABLET(25 MG) BY MOUTH DAILY 08/23/22   Corwin Levins, MD  ondansetron (ZOFRAN) 8 MG tablet Take 1 tablet (8 mg total) by mouth every 8 (eight) hours as needed for nausea or vomiting. 07/16/22   Rachel Moulds, MD  prochlorperazine (COMPAZINE) 10 MG tablet Take 1 tablet (10 mg total) by mouth every 6 (six) hours as needed for nausea or vomiting. 07/16/22   Rachel Moulds, MD  telmisartan (MICARDIS) 20 MG tablet TAKE 1 TABLET(20 MG) BY MOUTH DAILY 07/02/22   Corwin Levins, MD  triamcinolone (NASACORT) 55 MCG/ACT AERO nasal inhaler Place 2 sprays into the nose daily. Patient not taking: Reported on 02/01/2019 05/13/17 02/01/19  Corwin Levins, MD      Allergies    Amlodipine and Doxycycline  Review of Systems   Review of Systems  Genitourinary:  Positive for flank pain.    Physical Exam Updated Vital Signs BP (!) 162/85 (BP Location: Right Arm)   Pulse 97   Temp 98.2 F (36.8 C)   Resp 18   SpO2 99%  Physical Exam Vitals reviewed.  HENT:     Head: Normocephalic and atraumatic.  Eyes:     Pupils: Pupils are equal, round, and reactive to light.  Cardiovascular:     Rate and Rhythm: Normal rate.  Pulmonary:     Effort: Pulmonary effort is normal.  Abdominal:     General: Abdomen is flat.     Palpations: Abdomen is soft.     Tenderness: There is abdominal tenderness. There is no guarding.   Musculoskeletal:        General: No swelling or deformity. Normal range of motion.  Skin:    General: Skin is warm and dry.     Coloration: Skin is not jaundiced.  Neurological:     Mental Status: He is alert.     Cranial Nerves: No cranial nerve deficit.     Motor: No weakness.     ED Results / Procedures / Treatments   Labs (all labs ordered are listed, but only abnormal results are displayed) Labs Reviewed  CBC - Abnormal; Notable for the following components:      Result Value   RBC 3.42 (*)    Hemoglobin 9.2 (*)    HCT 30.3 (*)    RDW 16.5 (*)    All other components within normal limits  URINALYSIS, W/ REFLEX TO CULTURE (INFECTION SUSPECTED) - Abnormal; Notable for the following components:   Protein, ur 100 (*)    All other components within normal limits  COMPREHENSIVE METABOLIC PANEL - Abnormal; Notable for the following components:   CO2 21 (*)    Glucose, Bld 100 (*)    BUN 29 (*)    Creatinine, Ser 2.40 (*)    AST 86 (*)    ALT 60 (*)    Alkaline Phosphatase 129 (*)    GFR, Estimated 33 (*)    All other components within normal limits    EKG None  Radiology No results found.  Procedures Procedures    Medications Ordered in ED Medications  HYDROmorphone (DILAUDID) injection 1 mg (1 mg Intravenous Given 10/12/22 2158)  ondansetron (ZOFRAN) injection 4 mg (4 mg Intravenous Given 10/12/22 2158)  iohexol (OMNIPAQUE) 300 MG/ML solution 100 mL (100 mLs Intravenous Contrast Given 10/12/22 2228)    ED Course/ Medical Decision Making/ A&P                                 Medical Decision Making 42 year old male here today with right sided flank pain.  Differential diagnoses include mass, abscess, hematoma, biliary pathology, less likely pneumonia, less likely PE.  Plan-will obtain imaging of the patient's abdomen and pelvis.  Given his prior surgical history, do have concern for either recurrent mass, postoperative complication.  I independently  reviewed the patient's urology consultation notes.  History partially obtained via patient's wife who is at bedside.  Reviewed the patient's labs, his hemoglobin is 9.8, a bit lower than previous but within range of his prior measurements.  Denies any dark stools.  His kidney function is worse from previous, with a creatinine of 2.4 up from a baseline of 1.6.  Will provide some additional IV fluids.  Patient will be signed out to evening physician pending CT imaging read, likely admission.  Amount and/or Complexity of Data Reviewed Labs: ordered. Radiology: ordered.  Risk Prescription drug management.           Final Clinical Impression(s) / ED Diagnoses Final diagnoses:  None    Rx / DC Orders ED Discharge Orders     None         Arletha Pili, DO 10/12/22 2242    Anders Simmonds T, DO 10/12/22 2257

## 2022-10-12 NOTE — ED Triage Notes (Addendum)
Pt reports right kidney removed 06/21/22 r/t kidney ca, adrenal gland also removed , undergoing immunotherapy, pt reports x 2 days , increasing pain where the kidney was removed, is now unbearable, causing SOB on exertion. Pt talking in full sentences in triage, sating 99, RR unlabored currently

## 2022-10-13 ENCOUNTER — Inpatient Hospital Stay: Payer: BC Managed Care – PPO

## 2022-10-13 ENCOUNTER — Inpatient Hospital Stay: Payer: BC Managed Care – PPO | Attending: Hematology and Oncology | Admitting: Hematology and Oncology

## 2022-10-13 ENCOUNTER — Other Ambulatory Visit: Payer: Self-pay

## 2022-10-13 ENCOUNTER — Encounter (HOSPITAL_COMMUNITY): Payer: Self-pay | Admitting: Family Medicine

## 2022-10-13 DIAGNOSIS — C641 Malignant neoplasm of right kidney, except renal pelvis: Secondary | ICD-10-CM | POA: Diagnosis not present

## 2022-10-13 DIAGNOSIS — Z86711 Personal history of pulmonary embolism: Secondary | ICD-10-CM

## 2022-10-13 DIAGNOSIS — C799 Secondary malignant neoplasm of unspecified site: Secondary | ICD-10-CM | POA: Diagnosis present

## 2022-10-13 DIAGNOSIS — I10 Essential (primary) hypertension: Secondary | ICD-10-CM

## 2022-10-13 DIAGNOSIS — Z905 Acquired absence of kidney: Secondary | ICD-10-CM | POA: Diagnosis not present

## 2022-10-13 DIAGNOSIS — C7802 Secondary malignant neoplasm of left lung: Secondary | ICD-10-CM | POA: Diagnosis present

## 2022-10-13 DIAGNOSIS — J309 Allergic rhinitis, unspecified: Secondary | ICD-10-CM | POA: Diagnosis present

## 2022-10-13 DIAGNOSIS — C786 Secondary malignant neoplasm of retroperitoneum and peritoneum: Secondary | ICD-10-CM

## 2022-10-13 DIAGNOSIS — Z8249 Family history of ischemic heart disease and other diseases of the circulatory system: Secondary | ICD-10-CM | POA: Diagnosis not present

## 2022-10-13 DIAGNOSIS — Z8616 Personal history of COVID-19: Secondary | ICD-10-CM | POA: Diagnosis not present

## 2022-10-13 DIAGNOSIS — C787 Secondary malignant neoplasm of liver and intrahepatic bile duct: Secondary | ICD-10-CM | POA: Insufficient documentation

## 2022-10-13 DIAGNOSIS — K219 Gastro-esophageal reflux disease without esophagitis: Secondary | ICD-10-CM | POA: Diagnosis present

## 2022-10-13 DIAGNOSIS — N1831 Chronic kidney disease, stage 3a: Secondary | ICD-10-CM

## 2022-10-13 DIAGNOSIS — F419 Anxiety disorder, unspecified: Secondary | ICD-10-CM | POA: Diagnosis present

## 2022-10-13 DIAGNOSIS — R109 Unspecified abdominal pain: Secondary | ICD-10-CM | POA: Diagnosis present

## 2022-10-13 DIAGNOSIS — Z9221 Personal history of antineoplastic chemotherapy: Secondary | ICD-10-CM | POA: Diagnosis not present

## 2022-10-13 DIAGNOSIS — I129 Hypertensive chronic kidney disease with stage 1 through stage 4 chronic kidney disease, or unspecified chronic kidney disease: Secondary | ICD-10-CM | POA: Diagnosis present

## 2022-10-13 DIAGNOSIS — Z85528 Personal history of other malignant neoplasm of kidney: Secondary | ICD-10-CM | POA: Diagnosis not present

## 2022-10-13 DIAGNOSIS — Z87891 Personal history of nicotine dependence: Secondary | ICD-10-CM | POA: Diagnosis not present

## 2022-10-13 DIAGNOSIS — Z23 Encounter for immunization: Secondary | ICD-10-CM | POA: Diagnosis not present

## 2022-10-13 DIAGNOSIS — N179 Acute kidney failure, unspecified: Secondary | ICD-10-CM | POA: Diagnosis present

## 2022-10-13 DIAGNOSIS — Z79620 Long term (current) use of immunosuppressive biologic: Secondary | ICD-10-CM | POA: Insufficient documentation

## 2022-10-13 DIAGNOSIS — Z79891 Long term (current) use of opiate analgesic: Secondary | ICD-10-CM | POA: Diagnosis not present

## 2022-10-13 DIAGNOSIS — Z881 Allergy status to other antibiotic agents status: Secondary | ICD-10-CM | POA: Diagnosis not present

## 2022-10-13 DIAGNOSIS — Z7951 Long term (current) use of inhaled steroids: Secondary | ICD-10-CM | POA: Diagnosis not present

## 2022-10-13 DIAGNOSIS — Z818 Family history of other mental and behavioral disorders: Secondary | ICD-10-CM | POA: Diagnosis not present

## 2022-10-13 DIAGNOSIS — Z7901 Long term (current) use of anticoagulants: Secondary | ICD-10-CM | POA: Diagnosis not present

## 2022-10-13 DIAGNOSIS — Z79899 Other long term (current) drug therapy: Secondary | ICD-10-CM | POA: Diagnosis not present

## 2022-10-13 DIAGNOSIS — Z5112 Encounter for antineoplastic immunotherapy: Secondary | ICD-10-CM | POA: Insufficient documentation

## 2022-10-13 LAB — COMPREHENSIVE METABOLIC PANEL
ALT: 52 U/L — ABNORMAL HIGH (ref 0–44)
AST: 74 U/L — ABNORMAL HIGH (ref 15–41)
Albumin: 3.4 g/dL — ABNORMAL LOW (ref 3.5–5.0)
Alkaline Phosphatase: 117 U/L (ref 38–126)
Anion gap: 10 (ref 5–15)
BUN: 27 mg/dL — ABNORMAL HIGH (ref 6–20)
CO2: 21 mmol/L — ABNORMAL LOW (ref 22–32)
Calcium: 8.9 mg/dL (ref 8.9–10.3)
Chloride: 105 mmol/L (ref 98–111)
Creatinine, Ser: 2.22 mg/dL — ABNORMAL HIGH (ref 0.61–1.24)
GFR, Estimated: 37 mL/min — ABNORMAL LOW (ref >60)
Glucose, Bld: 103 mg/dL — ABNORMAL HIGH (ref 70–99)
Potassium: 4.9 mmol/L (ref 3.5–5.1)
Sodium: 136 mmol/L (ref 135–145)
Total Bilirubin: 0.8 mg/dL (ref 0.3–1.2)
Total Protein: 7.3 g/dL (ref 6.5–8.1)

## 2022-10-13 LAB — HIV ANTIBODY (ROUTINE TESTING W REFLEX): HIV Screen 4th Generation wRfx: NONREACTIVE

## 2022-10-13 LAB — CBC
HCT: 26.9 % — ABNORMAL LOW (ref 39.0–52.0)
Hemoglobin: 8.2 g/dL — ABNORMAL LOW (ref 13.0–17.0)
MCH: 27.6 pg (ref 26.0–34.0)
MCHC: 30.5 g/dL (ref 30.0–36.0)
MCV: 90.6 fL (ref 80.0–100.0)
Platelets: 214 10*3/uL (ref 150–400)
RBC: 2.97 MIL/uL — ABNORMAL LOW (ref 4.22–5.81)
RDW: 16.6 % — ABNORMAL HIGH (ref 11.5–15.5)
WBC: 6.3 10*3/uL (ref 4.0–10.5)
nRBC: 0 % (ref 0.0–0.2)

## 2022-10-13 MED ORDER — ONDANSETRON HCL 4 MG/2ML IJ SOLN: 4.0000 mg | Freq: Four times a day (QID) | INTRAMUSCULAR | Status: DC | PRN

## 2022-10-13 MED ORDER — FREE WATER
400.0000 mL | Freq: Every day | Status: DC
  Administered 2022-10-13 – 2022-10-14 (×3): 400 mL via ORAL

## 2022-10-13 MED ORDER — SODIUM CHLORIDE 0.9 % IV SOLN
1.0000 g | Freq: Once | INTRAVENOUS | Status: AC
  Administered 2022-10-13: 1 g via INTRAVENOUS
  Filled 2022-10-13: qty 10

## 2022-10-13 MED ORDER — APIXABAN 5 MG PO TABS
5.0000 mg | ORAL_TABLET | Freq: Two times a day (BID) | ORAL | Status: DC
  Administered 2022-10-13 – 2022-10-14 (×3): 5 mg via ORAL
  Filled 2022-10-13 (×3): qty 1

## 2022-10-13 MED ORDER — SODIUM CHLORIDE 0.9 % IV SOLN: INTRAVENOUS | Status: DC

## 2022-10-13 MED ORDER — HYDROMORPHONE HCL 1 MG/ML IJ SOLN
0.5000 mg | INTRAMUSCULAR | Status: DC | PRN
  Administered 2022-10-13 (×2): 1 mg via INTRAVENOUS
  Filled 2022-10-13 (×2): qty 1

## 2022-10-13 MED ORDER — POLYETHYLENE GLYCOL 3350 17 G PO PACK: 17.0000 g | PACK | Freq: Every day | ORAL | Status: DC | PRN

## 2022-10-13 MED ORDER — INFLUENZA VIRUS VACC SPLIT PF (FLUZONE) 0.5 ML IM SUSY
0.5000 mL | PREFILLED_SYRINGE | INTRAMUSCULAR | Status: AC
  Administered 2022-10-14: 0.5 mL via INTRAMUSCULAR
  Filled 2022-10-13: qty 0.5

## 2022-10-13 MED ORDER — DIPHENHYDRAMINE HCL 25 MG PO CAPS
25.0000 mg | ORAL_CAPSULE | Freq: Four times a day (QID) | ORAL | Status: DC | PRN
  Administered 2022-10-13: 25 mg via ORAL
  Filled 2022-10-13: qty 1

## 2022-10-13 MED ORDER — OXYCODONE HCL 5 MG PO TABS
5.0000 mg | ORAL_TABLET | ORAL | Status: DC | PRN
  Administered 2022-10-14: 5 mg via ORAL
  Filled 2022-10-13 (×2): qty 1

## 2022-10-13 MED ORDER — ONDANSETRON HCL 4 MG PO TABS: 4.0000 mg | ORAL_TABLET | Freq: Four times a day (QID) | ORAL | Status: DC | PRN

## 2022-10-13 MED ORDER — ACETAMINOPHEN 650 MG RE SUPP: 650.0000 mg | Freq: Four times a day (QID) | RECTAL | Status: DC | PRN

## 2022-10-13 MED ORDER — HYDROMORPHONE HCL 1 MG/ML IJ SOLN
0.5000 mg | INTRAMUSCULAR | Status: DC | PRN
  Administered 2022-10-13 – 2022-10-14 (×6): 1 mg via INTRAVENOUS
  Filled 2022-10-13 (×6): qty 1

## 2022-10-13 MED ORDER — HYDROXYZINE HCL 25 MG PO TABS
25.0000 mg | ORAL_TABLET | Freq: Three times a day (TID) | ORAL | Status: DC | PRN
  Administered 2022-10-13: 25 mg via ORAL
  Filled 2022-10-13: qty 1

## 2022-10-13 MED ORDER — ACETAMINOPHEN 325 MG PO TABS: 650.0000 mg | ORAL_TABLET | Freq: Four times a day (QID) | ORAL | Status: DC | PRN

## 2022-10-13 MED ORDER — METOPROLOL SUCCINATE ER 25 MG PO TB24
25.0000 mg | ORAL_TABLET | Freq: Every day | ORAL | Status: DC
  Administered 2022-10-13 – 2022-10-14 (×2): 25 mg via ORAL
  Filled 2022-10-13 (×2): qty 1

## 2022-10-13 MED ORDER — CLONAZEPAM 0.5 MG PO TABS: 0.5000 mg | ORAL_TABLET | Freq: Two times a day (BID) | ORAL | Status: DC | PRN

## 2022-10-13 NOTE — Plan of Care (Signed)
  Problem: Respiratory: Goal: Ability to achieve and maintain a regular respiratory rate will improve Outcome: Progressing   Problem: Skin Integrity: Goal: Demonstration of wound healing without infection will improve Outcome: Progressing   Problem: Urinary Elimination: Goal: Ability to avoid or minimize complications of infection will improve Outcome: Progressing   Problem: Education: Goal: Knowledge of General Education information will improve Description: Including pain rating scale, medication(s)/side effects and non-pharmacologic comfort measures Outcome: Progressing   Problem: Clinical Measurements: Goal: Will remain free from infection Outcome: Progressing Goal: Respiratory complications will improve Outcome: Progressing Goal: Cardiovascular complication will be avoided Outcome: Progressing   Problem: Nutrition: Goal: Adequate nutrition will be maintained Outcome: Progressing

## 2022-10-13 NOTE — H&P (Signed)
History and Physical    Marcus Burns:[id number] DOB: 1980/02/24 DOA: 10/12/2022  PCP: Corwin Levins, MD   Patient coming from: Home   Chief Complaint: Right flank pain   HPI: Marcus Burns is a 42 y.o. male with medical history significant for hypertension, anxiety, PE on Eliquis, and clear-cell RCC status post radical nephrectomy and currently undergoing treatment with pembrolizumab who presents with severe right flank pain.  Patient noticed pain in the right flank 2 days ago.  It has progressively worsened and became unbearable tonight.  He denies any fever, chills, chest pain, or other symptoms.  ED Course: Upon arrival to the ED, patient is found to be afebrile and saturating well on room air with normal heart rate and stable blood pressure.  Labs are most notable for creatinine 2.40, AST 86, ALT 60, normal WBC, and hemoglobin 9.2.  CT of the abdomen pelvis is concerning for residual or recurrent tumor at right renal fossa with findings in retroperitoneum, liver, peritoneum, and lung that are concerning for metastatic disease.  Patient was treated in the ED with 1 L LR, Zofran, Dilaudid, and Rocephin.  Review of Systems:  All other systems reviewed and apart from HPI, are negative.  Past Medical History:  Diagnosis Date   Acute prostatitis 06/19/2007   ALLERGIC RHINITIS 06/19/2007   ANXIETY 06/19/2007   Cancer (HCC)    kidney   ELEVATED BLOOD PRESSURE WITHOUT DIAGNOSIS OF HYPERTENSION 06/19/2007   GERD 06/19/2007   GERD (gastroesophageal reflux disease)    History of COVID-19 01/24/2019   History of kidney stones    Hypertension    Inguinal hernia    Migraines    NEPHROLITHIASIS, HX OF 06/19/2007   PE (pulmonary thromboembolism) (HCC)     Past Surgical History:  Procedure Laterality Date   CYSTOSCOPY WITH RETROGRADE PYELOGRAM, URETEROSCOPY AND STENT PLACEMENT Bilateral 02/09/2019   Procedure: CYSTOSCOPY WITH RETROGRADE PYELOGRAM, DIAGNOSTIC URETEROSCOPY AND  STENT PLACEMENT;  Surgeon: Sebastian Ache, MD;  Location: [facility];  Service: Urology;  Laterality: Bilateral;  75 MINS   CYSTOSCOPY WITH RETROGRADE PYELOGRAM, URETEROSCOPY AND STENT PLACEMENT Bilateral 02/23/2019   Procedure: CYSTOSCOPY WITH RETROGRADE PYELOGRAM, URETEROSCOPY AND STENT PLACEMENT;  Surgeon: Sebastian Ache, MD;  Location: [facility];  Service: Urology;  Laterality: Bilateral;  1 HR   HERNIA REPAIR     HOLMIUM LASER APPLICATION Bilateral 02/23/2019   Procedure: HOLMIUM LASER APPLICATION;  Surgeon: Sebastian Ache, MD;  Location: [facility];  Service: Urology;  Laterality: Bilateral;   ROBOT ASSISTED LAPAROSCOPIC NEPHRECTOMY Right 06/18/2022   Procedure: XI ROBOTIC ASSISTED LAPAROSCOPIC RADICAL NEPHRECTOMY AND PERICAVAL LYMPH NODE DISSECTION;  Surgeon: Loletta Parish., MD;  Location: [facility];  Service: Urology;  Laterality: Right;  3 HRS    Social History:   reports that he has quit smoking. He has quit using smokeless tobacco. He reports that he does not drink alcohol and does not use drugs.  Allergies  Allergen Reactions   Amlodipine     Edema ( 5 mg)   Doxycycline Other (See Comments)    Light headed , passed out    Family History  Problem Relation Age of Onset   Coronary artery disease Father    Anxiety disorder Mother    Anxiety disorder Brother    Diabetes Brother      Prior to Admission medications   Medication Sig Start Date End Date Taking? Authorizing Provider  acetaminophen (TYLENOL) 500 MG tablet Take 1,000 mg by mouth every 6 (six)  hours as needed (pain.).    [provider]  albuterol (VENTOLIN HFA) 108 (90 Base) MCG/ACT inhaler Inhale 2 puffs into the lungs every 6 (six) hours as needed for wheezing or shortness of breath. 12/24/21   Corwin Levins, MD  apixaban (ELIQUIS) 5 MG TABS tablet Take 1 tablet (5 mg total) by mouth 2 (two) times daily. 06/10/22   Rachel Moulds, MD  APPLE CIDER VINEGAR PO Take 15 mLs by mouth daily.    [provider]   cetirizine (ZYRTEC) 10 MG tablet Take 10 mg by mouth daily.    [provider]  cholecalciferol (VITAMIN D3) 25 MCG (1000 UNIT) tablet Take 1,000 Units by mouth daily.    [provider]  clonazePAM (KLONOPIN) 0.5 MG tablet Take 1 tablet (0.5 mg total) by mouth 2 (two) times daily as needed for anxiety. 07/13/22   Corwin Levins, MD  docusate sodium (COLACE) 100 MG capsule Take 1 capsule (100 mg total) by mouth 2 (two) times daily. 06/18/22   Harrie Foreman, PA-C  HYDROcodone-acetaminophen (NORCO) 5-325 MG tablet Take 1-2 tablets by mouth every 6 (six) hours as needed for moderate pain or severe pain. 06/18/22   Harrie Foreman, PA-C  iron polysaccharides (NU-IRON) 150 MG capsule Take 1 capsule (150 mg total) by mouth daily. 09/08/22   Corwin Levins, MD  lidocaine-prilocaine (EMLA) cream Apply to affected area once 07/16/22   Rachel Moulds, MD  metoprolol succinate (TOPROL-XL) 25 MG 24 hr tablet TAKE 1 TABLET(25 MG) BY MOUTH DAILY 08/23/22   Corwin Levins, MD  ondansetron (ZOFRAN) 8 MG tablet Take 1 tablet (8 mg total) by mouth every 8 (eight) hours as needed for nausea or vomiting. 07/16/22   Rachel Moulds, MD  prochlorperazine (COMPAZINE) 10 MG tablet Take 1 tablet (10 mg total) by mouth every 6 (six) hours as needed for nausea or vomiting. 07/16/22   Rachel Moulds, MD  telmisartan (MICARDIS) 20 MG tablet TAKE 1 TABLET(20 MG) BY MOUTH DAILY 07/02/22   Corwin Levins, MD  triamcinolone (NASACORT) 55 MCG/ACT AERO nasal inhaler Place 2 sprays into the nose daily. Patient not taking: Reported on 02/01/2019 05/13/17 02/01/19  Corwin Levins, MD    Physical Exam: Vitals:   10/12/22 2107 10/12/22 2108 10/13/22 0015  BP: (!) 162/85  130/71  Pulse: 97  86  Resp: 18  18  Temp:  98.2 F (36.8 C) 98.2 F (36.8 C)  TempSrc:   Oral  SpO2: 99%  94%    Constitutional: NAD, no pallor or cyanosis   Eyes: PERTLA, lids and conjunctivae normal ENMT: Mucous membranes are moist. Posterior pharynx  clear of any exudate or lesions.   Neck: supple, no masses  Respiratory: no wheezing, no crackles. No accessory muscle use.  Cardiovascular: S1 & S2 heard, regular rate and rhythm. No JVD. Abdomen: Soft, tender in right flank. Bowel sounds active.  Musculoskeletal: no clubbing / cyanosis. No joint deformity upper and lower extremities.   Skin: no significant rashes, lesions, ulcers. Warm, dry, well-perfused. Neurologic: CN 2-12 grossly intact. Moving all extremities. Alert and oriented.  Psychiatric: Calm. Cooperative.    Labs and Imaging on Admission: I have personally reviewed following labs and imaging studies  CBC: Recent Labs  Lab 10/12/22 2111  WBC 7.0  HGB 9.2*  HCT 30.3*  MCV 88.6  PLT 284   Basic Metabolic Panel: Recent Labs  Lab 10/12/22 2142  NA 138  K 4.3  CL 108  CO2 21*  GLUCOSE 100*  BUN 29*  CREATININE 2.40*  CALCIUM 9.2   GFR: CrCl cannot be calculated (Unknown ideal weight.). Liver Function Tests: Recent Labs  Lab 10/12/22 2142  AST 86*  ALT 60*  ALKPHOS 129*  BILITOT 0.8  PROT 8.0  ALBUMIN 3.8   No results for input(s): "LIPASE", "AMYLASE" in the last 168 hours. No results for input(s): "AMMONIA" in the last 168 hours. Coagulation Profile: No results for input(s): "INR", "PROTIME" in the last 168 hours. Cardiac Enzymes: No results for input(s): "CKTOTAL", "CKMB", "CKMBINDEX", "TROPONINI" in the last 168 hours. BNP (last 3 results) No results for input(s): "PROBNP" in the last 8760 hours. HbA1C: No results for input(s): "HGBA1C" in the last 72 hours. CBG: No results for input(s): "GLUCAP" in the last 168 hours. Lipid Profile: No results for input(s): "CHOL", "HDL", "LDLCALC", "TRIG", "CHOLHDL", "LDLDIRECT" in the last 72 hours. Thyroid Function Tests: No results for input(s): "TSH", "T4TOTAL", "FREET4", "T3FREE", "THYROIDAB" in the last 72 hours. Anemia Panel: No results for input(s): "VITAMINB12", "FOLATE", "FERRITIN", "TIBC",  "IRON", "RETICCTPCT" in the last 72 hours. Urine analysis:    Component Value Date/Time   COLORURINE YELLOW 10/12/2022 2111   APPEARANCEUR CLEAR 10/12/2022 2111   LABSPEC 1.029 10/12/2022 2111   PHURINE 5.0 10/12/2022 2111   GLUCOSEU NEGATIVE 10/12/2022 2111   GLUCOSEU NEGATIVE 01/22/2022 1037   HGBUR NEGATIVE 10/12/2022 2111   BILIRUBINUR NEGATIVE 10/12/2022 2111   KETONESUR NEGATIVE 10/12/2022 2111   PROTEINUR 100 (A) 10/12/2022 2111   UROBILINOGEN 0.2 01/22/2022 1037   NITRITE NEGATIVE 10/12/2022 2111   LEUKOCYTESUR NEGATIVE 10/12/2022 2111   Sepsis Labs: @LABRCNTIP (procalcitonin:4,lacticidven:4) )No results found for this or any previous visit (from the past 240 hour(s)).   Radiological Exams on Admission: CT ABDOMEN PELVIS W CONTRAST  Result Date: 10/12/2022 CLINICAL DATA:  History of right kidney removal flank pain EXAM: CT ABDOMEN AND PELVIS WITH CONTRAST TECHNIQUE: Multidetector CT imaging of the abdomen and pelvis was performed using the standard protocol following bolus administration of intravenous contrast. RADIATION DOSE REDUCTION: This exam was performed according to the departmental dose-optimization program which includes automated exposure control, adjustment of the mA and/or kV according to patient size and/or use of iterative reconstruction technique. CONTRAST:  OMNIPAQUE IOHEXOL 300 MG/ML  SOLN COMPARISON:  CT 06/21/2022, 02/26/2022 FINDINGS: Lower chest: Lung bases demonstrate small right-sided pleural effusion. Bandlike densities at the right middle lobe and right lower lobe likely due to atelectasis. Interim finding of the new 4 mm left lung base pulmonary nodule, series 5, image 54 suspicious for a metastatic focus. Hepatobiliary: Liver is enlarged, measuring 25 cm craniocaudad. Overall heterogeneous appearance with multiple hypodense areas suspicious for metastatic disease. Suspect large metastatic mass involving the caudate lobe of liver with more focal  hypodensity in the vicinity of the IVC, probably related to tumor thrombus, series 2, image 44. Triangular shaped gas and fluid collection either within the right hepatic lobe or abutting its margin given appearance on postoperative CT from June. This area measures roughly 5.4 by 4.5 cm on series 2, image 36. Gallstones. No biliary dilatation. Pancreas: No inflammation or ductal dilatation Spleen: Within normal limits Adrenals/Urinary Tract: History of right adrenalectomy. Left adrenal gland is normal. Left kidney shows no hydronephrosis. Multiple small left kidney stones. Urinary bladder is decompressed. Status post right nephrectomy. Poorly defined infiltrative soft tissue density in the right retroperitoneum, difficult to separate from the inferior margin of right hepatic lobe and suspected to represent residual or recurrent tumor. Extensive nodular infiltration  extending inferiorly within the right retroperitoneum, contiguous with poorly defined density at the right renal fossa. Stomach/Bowel: Stomach nonenlarged. No dilated small bowel. No acute bowel wall thickening Vascular/Lymphatic: Nonaneurysmal aorta. Suspected tumor thrombus in the intra hepatic IVC. Reproductive: Negative prostate Other: No free air. Numerous peritoneal nodules, for example anterior peritoneal nodule deep to midline scar measuring 3.5 x 3.5 cm on series 2 image 65 left upper quadrant stranding and multiple nodules, series 2, image 39 consistent with peritoneal metastatic disease. Multiple additional mesenteric nodules. Diffuse infiltrative nodular appearance of the right greater than left omentum with diffuse peritoneal nodularity on the right. Edema and stranding within the right greater than left flank. Small moderate volume of pelvic ascites. Musculoskeletal: No acute or suspicious osseous abnormality. Nodularity of the right rectus sheath, series 2 image 77 and 78. Suspicious for metastatic disease. IMPRESSION: 1. Status post right  nephrectomy and right adrenalectomy. Poorly defined soft tissue density in the expected location of right renal fossa with extensive ill-defined nodular soft tissue thickening in the right retroperitoneum, concerning for residual or recurrent tumor and metastatic disease. 2. Enlarged liver with multiple hypodense areas suspicious for metastatic disease. Suspect large metastatic mass involving the caudate lobe of liver with more focal hypodensity in the vicinity of the IVC, probably related to tumor thrombus. 3. Triangular shaped gas and fluid collection either within the right hepatic lobe or abutting its margin given appearance on postoperative CT from June. This area measures roughly 5.4 by 4.5 cm and is indeterminate for necrotic tumor or infected fluid collection. 4. Small volume abdominopelvic ascites, new compared to prior exam. 5. Extensive right-sided predominant peritoneal metastatic disease. Small to moderate volume of pelvic ascites. 6. Interim finding of the new 4 mm left lung base pulmonary nodule, suspicious for a metastatic nodule. 7. Small right-sided pleural effusion with right middle lobe and right lower lobe atelectasis. 8. Gallstones. Nonobstructing left kidney stones. Electronically Signed   By: Jasmine Pang M.D.   On: 10/12/2022 23:20    EKG: Independently reviewed. Sinus tachycardia, rate 100.   Assessment/Plan   1. Metastatic disease; clear cell renal cell carcinoma  - CT today concerning for residual or recurrent tumor at right renal fossa with findings in retroperitoneum, liver, peritoneum, and lung concerning for metastatic disease  - Patient and his wife are aware of these findings - Consult oncology in am   2. AKI superimposed on CKD 3A  - SCr is 2.40, up from 1.69 last month  - No left hydronephrosis on CT in ED  - Hold ARB, renally-dose medications, continue IVF hydration, repeat chem panel in am    3. Hx of PE  - Continue Eliquis   4. Hypertension  - Continue  metoprolol, hold ARB in light of AKI     DVT prophylaxis: Eliquis  Code Status: Full  Level of Care: Level of care: Med-Surg Family Communication: Wife at bedside   Disposition Plan:  Patient is from: Home  Anticipated d/c is to: TBD Anticipated d/c date is: 10/16/22  Patient currently: Pending improved/stable renal function  Consults called: None  Admission status: Inpatient     Briscoe Deutscher, MD [facility]  10/13/2022, 1:03 AM

## 2022-10-13 NOTE — ED Provider Notes (Addendum)
Patient signed out pending CT scan.  CT concerning for possible recurrent mass.  AKI. CT does indicate likely recurrent mass with metastasis.  Patient was updated at the bedside.  Will plan for admission to the hospitalist. Physical Exam  BP 130/71   Pulse 86   Temp 98.2 F (36.8 C) (Oral)   Resp 18   SpO2 94%   Physical Exam Awake, alert, no acute distress Procedures  Procedures  ED Course / MDM    Medical Decision Making Amount and/or Complexity of Data Reviewed Labs: ordered. Radiology: ordered.  Risk Prescription drug management. Decision regarding hospitalization.   Problem List Items Addressed This Visit       Genitourinary   * (Principal) Acute renal failure superimposed on stage 3a chronic kidney disease (HCC)   Other Visit Diagnoses     Flank pain    -  Primary   Pyelonephritis       Relevant Medications   cefTRIAXone (ROCEPHIN) 1 g in sodium chloride 0.9 % 100 mL IVPB (Completed)   Renal mass, right                 Shon Baton, MD 10/13/22 1610    Shon Baton, MD 10/13/22 [phone]

## 2022-10-13 NOTE — Progress Notes (Signed)
[facility] PROGRESS NOTE  Patient Care Team: Corwin Levins, MD as PCP - General  CHIEF COMPLAINTS/PURPOSE OF CONSULTATION:  Right sided abdominal pain  Oncology History  Clear cell carcinoma of kidney (HCC)  07/16/2022 Initial Diagnosis   Clear cell carcinoma of kidney (HCC)   08/11/2022 -  Chemotherapy   Patient is on Treatment Plan : RENAL Pembrolizumab (200) q21d       CURRENT TREATMENT: Single agent pembrolizumab  INTERIM HISTORY:  Marcus Burns 42 y.o. male who was admitted from the ER last night with severe right flank pain.  He feels better today but still continues to have some pain.  He says the pain started about 2 days ago and he also noticed some worsening shortness of breath.  Before this, he did not have any issues except for rhinorrhea secondary to [facility]. Wife at bedside.  MEDICAL HISTORY:  Past Medical History:  Diagnosis Date   Acute prostatitis 06/19/2007   ALLERGIC RHINITIS 06/19/2007   ANXIETY 06/19/2007   Cancer (HCC)    kidney   ELEVATED BLOOD PRESSURE WITHOUT DIAGNOSIS OF HYPERTENSION 06/19/2007   GERD 06/19/2007   GERD (gastroesophageal reflux disease)    History of COVID-19 01/24/2019   History of kidney stones    Hypertension    Inguinal hernia    Migraines    NEPHROLITHIASIS, HX OF 06/19/2007   PE (pulmonary thromboembolism) (HCC)     SURGICAL HISTORY: Past Surgical History:  Procedure Laterality Date   CYSTOSCOPY WITH RETROGRADE PYELOGRAM, URETEROSCOPY AND STENT PLACEMENT Bilateral 02/09/2019   Procedure: CYSTOSCOPY WITH RETROGRADE PYELOGRAM, DIAGNOSTIC URETEROSCOPY AND STENT PLACEMENT;  Surgeon: Sebastian Ache, MD;  Location: [facility];  Service: Urology;  Laterality: Bilateral;  75 MINS   CYSTOSCOPY WITH RETROGRADE PYELOGRAM, URETEROSCOPY AND STENT PLACEMENT Bilateral 02/23/2019   Procedure: CYSTOSCOPY WITH RETROGRADE PYELOGRAM, URETEROSCOPY AND STENT PLACEMENT;  Surgeon: Sebastian Ache, MD;  Location: [facility];  Service:  Urology;  Laterality: Bilateral;  1 HR   HERNIA REPAIR     HOLMIUM LASER APPLICATION Bilateral 02/23/2019   Procedure: HOLMIUM LASER APPLICATION;  Surgeon: Sebastian Ache, MD;  Location: [facility];  Service: Urology;  Laterality: Bilateral;   ROBOT ASSISTED LAPAROSCOPIC NEPHRECTOMY Right 06/18/2022   Procedure: XI ROBOTIC ASSISTED LAPAROSCOPIC RADICAL NEPHRECTOMY AND PERICAVAL LYMPH NODE DISSECTION;  Surgeon: Loletta Parish., MD;  Location: [facility];  Service: Urology;  Laterality: Right;  3 HRS    SOCIAL HISTORY: Social History   Socioeconomic History   Marital status: Married    Spouse name: Not on file   Number of children: Not on file   Years of education: Not on file   Highest education level: Not on file  Occupational History   Not on file  Tobacco Use   Smoking status: Former   Smokeless tobacco: Former  Advertising account planner   Vaping status: Never Used  Substance and Sexual Activity   Alcohol use: No   Drug use: No   Sexual activity: Not on file  Other Topics Concern   Not on file  Social History Narrative   Not on file   Social Determinants of Health   Financial Resource Strain: Not on file  Food Insecurity: No Food Insecurity (10/13/2022)   Hunger Vital Sign    Worried About Running Out of Food in the Last Year: Never true    Ran Out of Food in the Last Year: Never true  Transportation Needs: No Transportation Needs (10/13/2022)   PRAPARE - Transportation  Lack of Transportation (Medical): No    Lack of Transportation (Non-Medical): No  Physical Activity: Not on file  Stress: Not on file  Social Connections: Not on file  Intimate Partner Violence: Not At Risk (10/13/2022)   Humiliation, Afraid, Rape, and Kick questionnaire    Fear of Current or Ex-Partner: No    Emotionally Abused: No    Physically Abused: No    Sexually Abused: No    FAMILY HISTORY: Family History  Problem Relation Age of Onset   Coronary artery disease Father    Anxiety disorder Mother     Anxiety disorder Brother    Diabetes Brother     ALLERGIES:  is allergic to amlodipine and doxycycline.  MEDICATIONS:  Current Facility-Administered Medications  Medication Dose Route Frequency Provider Last Rate Last Admin   acetaminophen (TYLENOL) tablet 650 mg  650 mg Oral Q6H PRN Opyd, Lavone Neri, MD       Or   acetaminophen (TYLENOL) suppository 650 mg  650 mg Rectal Q6H PRN Opyd, Lavone Neri, MD       apixaban (ELIQUIS) tablet 5 mg  5 mg Oral BID Opyd, Lavone Neri, MD   5 mg at 10/13/22 0631   clonazePAM (KLONOPIN) tablet 0.5 mg  0.5 mg Oral BID PRN Opyd, Lavone Neri, MD       diphenhydrAMINE (BENADRYL) capsule 25 mg  25 mg Oral Q6H PRN Narda Bonds, MD   25 mg at 10/13/22 1600   free water 400 mL  400 mL Oral 5 X Daily Narda Bonds, MD       HYDROmorphone (DILAUDID) injection 0.5-1 mg  0.5-1 mg Intravenous Q3H PRN Narda Bonds, MD   1 mg at 10/13/22 1510   [START ON 10/14/2022] influenza vac split trivalent PF (FLULAVAL) injection 0.5 mL  0.5 mL Intramuscular Tomorrow-1000 Opyd, Lavone Neri, MD       metoprolol succinate (TOPROL-XL) 24 hr tablet 25 mg  25 mg Oral Daily Opyd, Lavone Neri, MD   25 mg at 10/13/22 0921   ondansetron (ZOFRAN) tablet 4 mg  4 mg Oral Q6H PRN Opyd, Lavone Neri, MD       Or   ondansetron (ZOFRAN) injection 4 mg  4 mg Intravenous Q6H PRN Opyd, Lavone Neri, MD       oxyCODONE (Oxy IR/ROXICODONE) immediate release tablet 5 mg  5 mg Oral Q4H PRN Opyd, Lavone Neri, MD       polyethylene glycol (MIRALAX / GLYCOLAX) packet 17 g  17 g Oral Daily PRN Opyd, Lavone Neri, MD         PHYSICAL EXAMINATION: ECOG PERFORMANCE STATUS: 0 - Asymptomatic  Vitals:   10/13/22 0959 10/13/22 1357  BP: 127/81 118/74  Pulse: (!) 103 90  Resp: 17 16  Temp: 98.6 F (37 C) 98.4 F (36.9 C)  SpO2: 95% 98%    Filed Weights   10/13/22 0154 10/13/22 0457  Weight: (!) 324 lb 8.3 oz (147.2 kg) (!) 324 lb 8.3 oz (147.2 kg)   Constitutional: Oriented to person, place, and time and  well-developed, well-nourished, and in no distress.    LABORATORY DATA:  I have reviewed the data as listed Lab Results  Component Value Date   WBC 6.3 10/13/2022   HGB 8.2 (L) 10/13/2022   HCT 26.9 (L) 10/13/2022   MCV 90.6 10/13/2022   PLT 214 10/13/2022     Chemistry      Component Value Date/Time   NA 136 10/13/2022 0414  K 4.9 10/13/2022 0414   CL 105 10/13/2022 0414   CO2 21 (L) 10/13/2022 0414   BUN 27 (H) 10/13/2022 0414   CREATININE 2.22 (H) 10/13/2022 0414   CREATININE 1.69 (H) 09/22/2022 1003      Component Value Date/Time   CALCIUM 8.9 10/13/2022 0414   ALKPHOS 117 10/13/2022 0414   AST 74 (H) 10/13/2022 0414   AST 34 09/22/2022 1003   ALT 52 (H) 10/13/2022 0414   ALT 27 09/22/2022 1003   BILITOT 0.8 10/13/2022 0414   BILITOT 0.5 09/22/2022 1003     PATHOLOGY:  SURGICAL PATHOLOGY * THIS IS AN ADDENDUM REPORT ** CASE: [id number] PATIENT: Marcus Burns Surgical Pathology Report   Reason for Addendum #1:  Immunohistochemistry results  Clinical History: Right renal mass (crm)     FINAL MICROSCOPIC DIAGNOSIS:  A. KIDNEY, RIGHT AND PERICAVAL LYMPH NODES, RADICAL NEPHRECTOMY:      Clear cell renal cell carcinoma, WHO / ISUP grade 3 (of 4).      Tumor size: 14.5 x 12 x 8.5 cm.      Tumor necrosis identified accounting for approximately 15% of parenchyma.      Carcinoma disrupts capsule and invades into perinephric tissue, adrenal parenchyma, renal vein and renal sinus.      Renal vein margin is positive for carcinoma. Ureteral and vascular margins are negative for carcinoma. Separated tumor satellite focus (0.5 cm). Background renal parenchyma with focal interstitial inflammation without glomerulosclerosis, tubal atrophy or vasculopathy. See oncology table.  ONCOLOGY TABLE:  KIDNEY: Nephrectomy  Procedure: Nephrectomy Specimen Laterality: Right Tumor Size: 14.5 x 12 x 8.5 cm Tumor Focality: One focus of mail tumor (14.5 cm) with one  fucus of tumor satellite nodule (0.5 cm) Histologic Type: Clear cell renal cell carcinoma Sarcomatoid Features: Not identified Rhabdoid Features: Not identified Histologic Grade: Grade 3 Tumor Necrosis: present, 15% of the parenchymal volume Tumor Extension: and invades into perinephric tissue, adrenal parenchyma, renal vein and renal sinus. Lymphatic and/or Vascular Invasion:  Not identified Margins: Renal vein margin is positive for tumor Regional Lymph Nodes:      Number of Lymph Nodes with Tumor: 0           Number of Lymph Nodes Examined: 1 Distant Metastasis:      Distant Site(s) Involved: Not applicable Additional Findings in Nonneoplastic Kidney: Focal interstitial inflammation without glomerulosclerosis, tubal atrophy or vasculopathy Pathologic Stage Classification (pTNM, AJCC 8th Edition): pT4, pN0 Ancillary Studies: Can be performed upon request   RADIOGRAPHIC STUDIES: I have personally reviewed the radiological images as listed and agreed with the findings in the report. CT ABDOMEN PELVIS W CONTRAST  Result Date: 10/12/2022 CLINICAL DATA:  History of right kidney removal flank pain EXAM: CT ABDOMEN AND PELVIS WITH CONTRAST TECHNIQUE: Multidetector CT imaging of the abdomen and pelvis was performed using the standard protocol following bolus administration of intravenous contrast. RADIATION DOSE REDUCTION: This exam was performed according to the departmental dose-optimization program which includes automated exposure control, adjustment of the mA and/or kV according to patient size and/or use of iterative reconstruction technique. CONTRAST:  OMNIPAQUE IOHEXOL 300 MG/ML  SOLN COMPARISON:  CT 06/21/2022, 02/26/2022 FINDINGS: Lower chest: Lung bases demonstrate small right-sided pleural effusion. Bandlike densities at the right middle lobe and right lower lobe likely due to atelectasis. Interim finding of the new 4 mm left lung base pulmonary nodule, series 5, image 54  suspicious for a metastatic focus. Hepatobiliary: Liver is enlarged, measuring 25 cm craniocaudad. Overall heterogeneous appearance  with multiple hypodense areas suspicious for metastatic disease. Suspect large metastatic mass involving the caudate lobe of liver with more focal hypodensity in the vicinity of the IVC, probably related to tumor thrombus, series 2, image 44. Triangular shaped gas and fluid collection either within the right hepatic lobe or abutting its margin given appearance on postoperative CT from June. This area measures roughly 5.4 by 4.5 cm on series 2, image 36. Gallstones. No biliary dilatation. Pancreas: No inflammation or ductal dilatation Spleen: Within normal limits Adrenals/Urinary Tract: History of right adrenalectomy. Left adrenal gland is normal. Left kidney shows no hydronephrosis. Multiple small left kidney stones. Urinary bladder is decompressed. Status post right nephrectomy. Poorly defined infiltrative soft tissue density in the right retroperitoneum, difficult to separate from the inferior margin of right hepatic lobe and suspected to represent residual or recurrent tumor. Extensive nodular infiltration extending inferiorly within the right retroperitoneum, contiguous with poorly defined density at the right renal fossa. Stomach/Bowel: Stomach nonenlarged. No dilated small bowel. No acute bowel wall thickening Vascular/Lymphatic: Nonaneurysmal aorta. Suspected tumor thrombus in the intra hepatic IVC. Reproductive: Negative prostate Other: No free air. Numerous peritoneal nodules, for example anterior peritoneal nodule deep to midline scar measuring 3.5 x 3.5 cm on series 2 image 65 left upper quadrant stranding and multiple nodules, series 2, image 39 consistent with peritoneal metastatic disease. Multiple additional mesenteric nodules. Diffuse infiltrative nodular appearance of the right greater than left omentum with diffuse peritoneal nodularity on the right. Edema and  stranding within the right greater than left flank. Small moderate volume of pelvic ascites. Musculoskeletal: No acute or suspicious osseous abnormality. Nodularity of the right rectus sheath, series 2 image 77 and 78. Suspicious for metastatic disease. IMPRESSION: 1. Status post right nephrectomy and right adrenalectomy. Poorly defined soft tissue density in the expected location of right renal fossa with extensive ill-defined nodular soft tissue thickening in the right retroperitoneum, concerning for residual or recurrent tumor and metastatic disease. 2. Enlarged liver with multiple hypodense areas suspicious for metastatic disease. Suspect large metastatic mass involving the caudate lobe of liver with more focal hypodensity in the vicinity of the IVC, probably related to tumor thrombus. 3. Triangular shaped gas and fluid collection either within the right hepatic lobe or abutting its margin given appearance on postoperative CT from June. This area measures roughly 5.4 by 4.5 cm and is indeterminate for necrotic tumor or infected fluid collection. 4. Small volume abdominopelvic ascites, new compared to prior exam. 5. Extensive right-sided predominant peritoneal metastatic disease. Small to moderate volume of pelvic ascites. 6. Interim finding of the new 4 mm left lung base pulmonary nodule, suspicious for a metastatic nodule. 7. Small right-sided pleural effusion with right middle lobe and right lower lobe atelectasis. 8. Gallstones. Nonobstructing left kidney stones. Electronically Signed   By: Jasmine Pang M.D.   On: 10/12/2022 23:20    ASSESSMENT & PLAN:  NAHMIR ZEIDMAN is a 42 y.o. male who presents for a follow up for clear cell renal carcinoma.   #[id number] Clear Cell Renal Cell Carcinoma: --Underwent right robotic radical nephrectomy with retroperitoneal lymph node dissection on 06/18/2022. Pathology revealed clear cell renal cell carcinoma. Tumor invades into perinephric tissue, adrenal parenchyma,  renal vein and renal sinus. Renal vein margin is positive for tumor.  --Post op CT CAP from 06/21/2022 did not revealed any obvious signs of metastatic disease.  --Recommendation was single agent pembrolizumab, started on 08/11/2022. --He is now admitted with right flank pain, had imaging which showed  concern for metastatic disease, extensive ill-defined nodular soft tissue thickening in the right retroperitoneum, enlarged liver with multiple hypodense areas, suspect large metastatic with more focal hypodensity in the vicinity of IVC,, extensive right-sided predominant peritoneal metastatic disease, small amount of pelvic ascites, 4 mm left lung base pulmonary nodule, small right-sided pleural effusion with right middle lobe and right lower lobe atelectasis. -- I have clearly discussed with the patient and his wife today that this is unfortunately metastatic renal cell carcinoma and the intent of treatment is palliative.  We have discussed options for treatment including combination of axitinib with pembrolizumab versus ipilimumab Nivo versus cabozantinib with nivolumab etc. -- He is open to any treatment that could potentially help him. -- I will discuss the case with Dr. Cherly Hensen, our GU oncologist and consider initiating Of treatment next week.  Patient understands the plan -- Continue Eliquis indefinitely   All questions were answered. The patient knows to call the clinic with any problems, questions or concerns.  Total time spent: 35 min

## 2022-10-13 NOTE — ED Notes (Signed)
ED TO INPATIENT HANDOFF REPORT  ED Nurse Name and Phone #: Deon Pilling [phone]  S Name/Age/Gender Marcus Burns 42 y.o. male Room/Bed: WA18/WA18  Code Status   Code Status: Full Code  Home/SNF/Other Home Patient oriented to: self, place, time, and situation Is this baseline? Yes   Triage Complete: Triage complete  Chief Complaint AKI (acute kidney injury) (HCC) [N17.9]  Triage Note Pt reports right kidney removed 06/21/22 r/t kidney ca, adrenal gland also removed , undergoing immunotherapy, pt reports x 2 days , increasing pain where the kidney was removed, is now unbearable, causing SOB on exertion. Pt talking in full sentences in triage, sating 99, RR unlabored currently    Allergies Allergies  Allergen Reactions   Amlodipine     Edema ( 5 mg)   Doxycycline Other (See Comments)    Light headed , passed out    Level of Care/Admitting Diagnosis ED Disposition     ED Disposition  Admit   Condition  --   Comment  Hospital Area: [facility] [[id number]]  Level of Care: Med-Surg [16]  May admit patient to [facility] or [facility] if equivalent level of care is available:: Yes  Covid Evaluation: Asymptomatic - no recent exposure (last 10 days) testing not required  Diagnosis: AKI (acute kidney injury) [facility]) [[id number]]  Admitting Physician: Briscoe Deutscher [[id number]]  Attending Physician: Briscoe Deutscher [[id number]]  Certification:: I certify this patient will need inpatient services for at least 2 midnights  Expected Medical Readiness: 10/16/2022          B Medical/Surgery History Past Medical History:  Diagnosis Date   Acute prostatitis 06/19/2007   ALLERGIC RHINITIS 06/19/2007   ANXIETY 06/19/2007   Cancer (HCC)    kidney   ELEVATED BLOOD PRESSURE WITHOUT DIAGNOSIS OF HYPERTENSION 06/19/2007   GERD 06/19/2007   GERD (gastroesophageal reflux disease)    History of COVID-19 01/24/2019   History of kidney stones    Hypertension    Inguinal  hernia    Migraines    NEPHROLITHIASIS, HX OF 06/19/2007   PE (pulmonary thromboembolism) (HCC)    Past Surgical History:  Procedure Laterality Date   CYSTOSCOPY WITH RETROGRADE PYELOGRAM, URETEROSCOPY AND STENT PLACEMENT Bilateral 02/09/2019   Procedure: CYSTOSCOPY WITH RETROGRADE PYELOGRAM, DIAGNOSTIC URETEROSCOPY AND STENT PLACEMENT;  Surgeon: Sebastian Ache, MD;  Location: [facility];  Service: Urology;  Laterality: Bilateral;  75 MINS   CYSTOSCOPY WITH RETROGRADE PYELOGRAM, URETEROSCOPY AND STENT PLACEMENT Bilateral 02/23/2019   Procedure: CYSTOSCOPY WITH RETROGRADE PYELOGRAM, URETEROSCOPY AND STENT PLACEMENT;  Surgeon: Sebastian Ache, MD;  Location: [facility];  Service: Urology;  Laterality: Bilateral;  1 HR   HERNIA REPAIR     HOLMIUM LASER APPLICATION Bilateral 02/23/2019   Procedure: HOLMIUM LASER APPLICATION;  Surgeon: Sebastian Ache, MD;  Location: [facility];  Service: Urology;  Laterality: Bilateral;   ROBOT ASSISTED LAPAROSCOPIC NEPHRECTOMY Right 06/18/2022   Procedure: XI ROBOTIC ASSISTED LAPAROSCOPIC RADICAL NEPHRECTOMY AND PERICAVAL LYMPH NODE DISSECTION;  Surgeon: Loletta Parish., MD;  Location: [facility];  Service: Urology;  Laterality: Right;  3 HRS     A IV Location/Drains/Wounds Patient Lines/Drains/Airways Status     Active Line/Drains/Airways     Name Placement date Placement time Site Days   Peripheral IV 10/12/22 20 G Left Antecubital 10/12/22  2144  Antecubital  1   Incision - 4 Ports Abdomen Right;Lateral Right;Lower Right;Upper;Mid Right;Upper 06/18/22  1232  -- 117  Intake/Output Last 24 hours No intake or output data in the 24 hours ending 10/13/22 0122  Labs/Imaging Results for orders placed or performed during the hospital encounter of 10/12/22 (from the past 48 hour(s))  CBC     Status: Abnormal   Collection Time: 10/12/22  9:11 PM  Result Value Ref Range   WBC 7.0 4.0 - 10.5 K/uL   RBC 3.42 (L) 4.22 - 5.81 MIL/uL   Hemoglobin 9.2 (L) 13.0  - 17.0 g/dL   HCT 86.5 (L) 78.4 - 69.6 %   MCV 88.6 80.0 - 100.0 fL   MCH 26.9 26.0 - 34.0 pg   MCHC 30.4 30.0 - 36.0 g/dL   RDW 29.5 (H) 28.4 - 13.2 %   Platelets 284 150 - 400 K/uL   nRBC 0.0 0.0 - 0.2 %    Comment: Performed at [facility], [address] [place], [place] [address]  Urinalysis, w/ Reflex to Culture (Infection Suspected) -Urine, Clean Catch     Status: Abnormal   Collection Time: 10/12/22  9:11 PM  Result Value Ref Range   Specimen Source URINE, CLEAN CATCH    Color, Urine YELLOW YELLOW   APPearance CLEAR CLEAR   Specific Gravity, Urine 1.029 1.005 - 1.030   pH 5.0 5.0 - 8.0   Glucose, UA NEGATIVE NEGATIVE mg/dL   Hgb urine dipstick NEGATIVE NEGATIVE   Bilirubin Urine NEGATIVE NEGATIVE   Ketones, ur NEGATIVE NEGATIVE mg/dL   Protein, ur 272 (A) NEGATIVE mg/dL   Nitrite NEGATIVE NEGATIVE   Leukocytes,Ua NEGATIVE NEGATIVE   RBC / HPF 0-5 0 - 5 RBC/hpf   WBC, UA 0-5 0 - 5 WBC/hpf    Comment:        Reflex urine culture not performed if WBC <=10, OR if Squamous epithelial cells >5. If Squamous epithelial cells >5 suggest recollection.    Bacteria, UA NONE SEEN NONE SEEN   Squamous Epithelial / HPF 0-5 0 - 5 /HPF   Mucus PRESENT    Hyaline Casts, UA PRESENT     Comment: Performed at [facility], [address] [place], [place] [address]  Comprehensive metabolic panel     Status: Abnormal   Collection Time: 10/12/22  9:42 PM  Result Value Ref Range   Sodium 138 135 - 145 mmol/L   Potassium 4.3 3.5 - 5.1 mmol/L   Chloride 108 98 - 111 mmol/L   CO2 21 (L) 22 - 32 mmol/L   Glucose, Bld 100 (H) 70 - 99 mg/dL    Comment: Glucose reference range applies only to samples taken after fasting for at least 8 hours.   BUN 29 (H) 6 - 20 mg/dL   Creatinine, Ser 4.03 (H) 0.61 - 1.24 mg/dL   Calcium 9.2 8.9 - 47.4 mg/dL   Total Protein 8.0 6.5 - 8.1 g/dL   Albumin 3.8 3.5 - 5.0 g/dL   AST 86 (H) 15 - 41 U/L   ALT 60 (H) 0 - 44  U/L   Alkaline Phosphatase 129 (H) 38 - 126 U/L   Total Bilirubin 0.8 0.3 - 1.2 mg/dL   GFR, Estimated 33 (L) >60 mL/min    Comment: (NOTE) Calculated using the CKD-EPI Creatinine Equation (2021)    Anion gap 9 5 - 15    Comment: Performed at [facility], [address] [place], [place] [address]   CT ABDOMEN PELVIS W CONTRAST  Result Date: 10/12/2022 CLINICAL DATA:  History of right kidney removal  flank pain EXAM: CT ABDOMEN AND PELVIS WITH CONTRAST TECHNIQUE: Multidetector CT imaging of the abdomen and pelvis was performed using the standard protocol following bolus administration of intravenous contrast. RADIATION DOSE REDUCTION: This exam was performed according to the departmental dose-optimization program which includes automated exposure control, adjustment of the mA and/or kV according to patient size and/or use of iterative reconstruction technique. CONTRAST:  OMNIPAQUE IOHEXOL 300 MG/ML  SOLN COMPARISON:  CT 06/21/2022, 02/26/2022 FINDINGS: Lower chest: Lung bases demonstrate small right-sided pleural effusion. Bandlike densities at the right middle lobe and right lower lobe likely due to atelectasis. Interim finding of the new 4 mm left lung base pulmonary nodule, series 5, image 54 suspicious for a metastatic focus. Hepatobiliary: Liver is enlarged, measuring 25 cm craniocaudad. Overall heterogeneous appearance with multiple hypodense areas suspicious for metastatic disease. Suspect large metastatic mass involving the caudate lobe of liver with more focal hypodensity in the vicinity of the IVC, probably related to tumor thrombus, series 2, image 44. Triangular shaped gas and fluid collection either within the right hepatic lobe or abutting its margin given appearance on postoperative CT from June. This area measures roughly 5.4 by 4.5 cm on series 2, image 36. Gallstones. No biliary dilatation. Pancreas: No inflammation or ductal dilatation Spleen: Within normal  limits Adrenals/Urinary Tract: History of right adrenalectomy. Left adrenal gland is normal. Left kidney shows no hydronephrosis. Multiple small left kidney stones. Urinary bladder is decompressed. Status post right nephrectomy. Poorly defined infiltrative soft tissue density in the right retroperitoneum, difficult to separate from the inferior margin of right hepatic lobe and suspected to represent residual or recurrent tumor. Extensive nodular infiltration extending inferiorly within the right retroperitoneum, contiguous with poorly defined density at the right renal fossa. Stomach/Bowel: Stomach nonenlarged. No dilated small bowel. No acute bowel wall thickening Vascular/Lymphatic: Nonaneurysmal aorta. Suspected tumor thrombus in the intra hepatic IVC. Reproductive: Negative prostate Other: No free air. Numerous peritoneal nodules, for example anterior peritoneal nodule deep to midline scar measuring 3.5 x 3.5 cm on series 2 image 65 left upper quadrant stranding and multiple nodules, series 2, image 39 consistent with peritoneal metastatic disease. Multiple additional mesenteric nodules. Diffuse infiltrative nodular appearance of the right greater than left omentum with diffuse peritoneal nodularity on the right. Edema and stranding within the right greater than left flank. Small moderate volume of pelvic ascites. Musculoskeletal: No acute or suspicious osseous abnormality. Nodularity of the right rectus sheath, series 2 image 77 and 78. Suspicious for metastatic disease. IMPRESSION: 1. Status post right nephrectomy and right adrenalectomy. Poorly defined soft tissue density in the expected location of right renal fossa with extensive ill-defined nodular soft tissue thickening in the right retroperitoneum, concerning for residual or recurrent tumor and metastatic disease. 2. Enlarged liver with multiple hypodense areas suspicious for metastatic disease. Suspect large metastatic mass involving the caudate lobe of  liver with more focal hypodensity in the vicinity of the IVC, probably related to tumor thrombus. 3. Triangular shaped gas and fluid collection either within the right hepatic lobe or abutting its margin given appearance on postoperative CT from June. This area measures roughly 5.4 by 4.5 cm and is indeterminate for necrotic tumor or infected fluid collection. 4. Small volume abdominopelvic ascites, new compared to prior exam. 5. Extensive right-sided predominant peritoneal metastatic disease. Small to moderate volume of pelvic ascites. 6. Interim finding of the new 4 mm left lung base pulmonary nodule, suspicious for a metastatic nodule. 7. Small right-sided pleural effusion with right middle  lobe and right lower lobe atelectasis. 8. Gallstones. Nonobstructing left kidney stones. Electronically Signed   By: Jasmine Pang M.D.   On: 10/12/2022 23:20    Pending Labs Unresulted Labs (From admission, onward)     Start     Ordered   10/13/22 0500  HIV Antibody (routine testing w rflx)  (HIV Antibody (Routine testing w reflex) panel)  Tomorrow morning,   R        10/13/22 0103   10/13/22 0500  Comprehensive metabolic panel  Daily,   R      10/13/22 0103   10/13/22 0500  CBC  Daily,   R      10/13/22 0103            Vitals/Pain Today's Vitals   10/12/22 2107 10/12/22 2108 10/12/22 2145 10/13/22 0015  BP: (!) 162/85   130/71  Pulse: 97   86  Resp: 18   18  Temp:  98.2 F (36.8 C)  98.2 F (36.8 C)  TempSrc:    Oral  SpO2: 99%   94%  PainSc:   8      Isolation Precautions No active isolations  Medications Medications  metoprolol succinate (TOPROL-XL) 24 hr tablet 25 mg (has no administration in time range)  apixaban (ELIQUIS) tablet 5 mg (has no administration in time range)  clonazePAM (KLONOPIN) tablet 0.5 mg (has no administration in time range)  0.9 %  sodium chloride infusion (has no administration in time range)  acetaminophen (TYLENOL) tablet 650 mg (has no administration in  time range)    Or  acetaminophen (TYLENOL) suppository 650 mg (has no administration in time range)  oxyCODONE (Oxy IR/ROXICODONE) immediate release tablet 5 mg (has no administration in time range)  HYDROmorphone (DILAUDID) injection 0.5-1 mg (1 mg Intravenous Given 10/13/22 0119)  polyethylene glycol (MIRALAX / GLYCOLAX) packet 17 g (has no administration in time range)  ondansetron (ZOFRAN) tablet 4 mg (has no administration in time range)    Or  ondansetron (ZOFRAN) injection 4 mg (has no administration in time range)  HYDROmorphone (DILAUDID) injection 1 mg (1 mg Intravenous Given 10/12/22 2158)  ondansetron (ZOFRAN) injection 4 mg (4 mg Intravenous Given 10/12/22 2158)  iohexol (OMNIPAQUE) 300 MG/ML solution 100 mL (100 mLs Intravenous Contrast Given 10/12/22 2228)  lactated ringers bolus 1,000 mL (0 mLs Intravenous Stopped 10/13/22 0054)  cefTRIAXone (ROCEPHIN) 1 g in sodium chloride 0.9 % 100 mL IVPB (0 g Intravenous Stopped 10/13/22 0054)    Mobility walks     Focused Assessments    R Recommendations: See Admitting Provider Note  Report given to:   Additional Notes:

## 2022-10-13 NOTE — Progress Notes (Signed)
   Patient seen and examined at bedside, patient admitted after midnight, please see earlier detailed admission note by Briscoe Deutscher, MD. Briefly, patient presented right flank pain and was found to have evidence of residual vs recurrent tumor at right renal fossa with metastatic findings of the liver, peritoneum, retroperitoneum and lung. Patient also found to have an AKI.  Subjective: Continued right sided pain today. Concerned about what CT findings mean for his future/life expectancy  BP 118/74 (BP Location: Right Arm)   Pulse 90   Temp 98.4 F (36.9 C) (Oral)   Resp 16   Ht 6\' 4"  (1.93 m)   Wt (!) 147.2 kg   SpO2 98%   BMI 39.50 kg/m   General exam: Appears calm and comfortable Respiratory system: Clear to auscultation. Respiratory effort normal. Cardiovascular system: S1 & S2 heard, RRR.  Gastrointestinal system: Abdomen is nondistended, soft and non-tender. Normal bowel sounds heard. Central nervous system: Alert and oriented. No focal neurological deficits. Psychiatry: Judgement and insight appear normal. Mood & affect appropriate.   Brief assessment/Plan:  Clear cell renal cell carcinoma Appears to have recurrence vs residual tumor with evidence of likely metastatic disease. Oncology consulted and will see patient while inpatient. -Continue analgesics PRN  Metastasis to liver, lung, peritoneum and retroperitoneum Noted on CT imaging.  AKI on CKD stage IIIa Baseline creatinine of about 1.5-1.6. Creatinine of 2.4 on admission. IV fluids started for treatment. Creatinine improved to 2.2 today. -Discontinue IV fluids and switch to PO oral intake  History of PE Primary hypertension Per H&P  Family communication: None at bedside DVT prophylaxis: Eliquis Disposition: Discharge home likely in 1-2 days pending improvement of AKI.  Jacquelin Hawking, MD [facility] 10/13/2022, 4:03 PM

## 2022-10-14 ENCOUNTER — Telehealth: Payer: Self-pay

## 2022-10-14 ENCOUNTER — Other Ambulatory Visit: Payer: Self-pay

## 2022-10-14 ENCOUNTER — Other Ambulatory Visit (HOSPITAL_COMMUNITY): Payer: Self-pay

## 2022-10-14 ENCOUNTER — Telehealth: Payer: Self-pay | Admitting: Hematology and Oncology

## 2022-10-14 ENCOUNTER — Telehealth: Payer: Self-pay | Admitting: Pharmacy Technician

## 2022-10-14 ENCOUNTER — Encounter: Payer: Self-pay | Admitting: Hematology and Oncology

## 2022-10-14 DIAGNOSIS — N179 Acute kidney failure, unspecified: Secondary | ICD-10-CM | POA: Diagnosis not present

## 2022-10-14 DIAGNOSIS — C641 Malignant neoplasm of right kidney, except renal pelvis: Secondary | ICD-10-CM | POA: Diagnosis not present

## 2022-10-14 DIAGNOSIS — C787 Secondary malignant neoplasm of liver and intrahepatic bile duct: Secondary | ICD-10-CM

## 2022-10-14 DIAGNOSIS — C786 Secondary malignant neoplasm of retroperitoneum and peritoneum: Secondary | ICD-10-CM

## 2022-10-14 LAB — COMPREHENSIVE METABOLIC PANEL
ALT: 46 U/L — ABNORMAL HIGH (ref 0–44)
AST: 72 U/L — ABNORMAL HIGH (ref 15–41)
Albumin: 3.4 g/dL — ABNORMAL LOW (ref 3.5–5.0)
Alkaline Phosphatase: 127 U/L — ABNORMAL HIGH (ref 38–126)
Anion gap: 10 (ref 5–15)
BUN: 23 mg/dL — ABNORMAL HIGH (ref 6–20)
CO2: 21 mmol/L — ABNORMAL LOW (ref 22–32)
Calcium: 8.9 mg/dL (ref 8.9–10.3)
Chloride: 105 mmol/L (ref 98–111)
Creatinine, Ser: 2.01 mg/dL — ABNORMAL HIGH (ref 0.61–1.24)
GFR, Estimated: 41 mL/min — ABNORMAL LOW (ref >60)
Glucose, Bld: 106 mg/dL — ABNORMAL HIGH (ref 70–99)
Potassium: 4.5 mmol/L (ref 3.5–5.1)
Sodium: 136 mmol/L (ref 135–145)
Total Bilirubin: 0.6 mg/dL (ref 0.3–1.2)
Total Protein: 7 g/dL (ref 6.5–8.1)

## 2022-10-14 LAB — CBC
HCT: 28.5 % — ABNORMAL LOW (ref 39.0–52.0)
Hemoglobin: 8.4 g/dL — ABNORMAL LOW (ref 13.0–17.0)
MCH: 27 pg (ref 26.0–34.0)
MCHC: 29.5 g/dL — ABNORMAL LOW (ref 30.0–36.0)
MCV: 91.6 fL (ref 80.0–100.0)
Platelets: 244 10*3/uL (ref 150–400)
RBC: 3.11 MIL/uL — ABNORMAL LOW (ref 4.22–5.81)
RDW: 16.5 % — ABNORMAL HIGH (ref 11.5–15.5)
WBC: 6.6 10*3/uL (ref 4.0–10.5)
nRBC: 0 % (ref 0.0–0.2)

## 2022-10-14 LAB — TSH: TSH: 3.725 u[IU]/mL (ref 0.350–4.500)

## 2022-10-14 MED ORDER — ACETAMINOPHEN 500 MG PO TABS: 500.0000 mg | ORAL_TABLET | Freq: Four times a day (QID) | ORAL | Status: DC | PRN

## 2022-10-14 MED ORDER — TELMISARTAN 20 MG PO TABS: 20.0000 mg | ORAL_TABLET | Freq: Every day | ORAL | Status: DC

## 2022-10-14 MED ORDER — OXYCODONE HCL 5 MG PO TABS: 5.0000 mg | ORAL_TABLET | ORAL | 0 refills | Status: AC | PRN

## 2022-10-14 MED ORDER — LENVATINIB (20 MG DAILY DOSE) 2 X 10 MG PO CPPK: 20.0000 mg | ORAL_CAPSULE | Freq: Every day | ORAL | 11 refills | Status: DC

## 2022-10-14 NOTE — Telephone Encounter (Signed)
Oral Oncology Patient Advocate Encounter   Received notification that prior authorization for Lenvima is required.   PA submitted on 10/14/22 Key [id number] Status is pending     Jinger Neighbors, CPhT-Adv Oncology Pharmacy Patient Advocate [facility] Direct Number: [phone]  Fax: [phone]

## 2022-10-14 NOTE — Discharge Instructions (Signed)
Marcus Burns,  You were in the hospital with right sided abdominal pain. This appears to be related to recurrence or persistence of your renal cancer. The oncologist is starting treatment as an outpatient. Please continue your pain medication as needed. Regarding your kidney function, please continue to stay well hydrated. I am recommending you hold your blood pressure medication for a few days as well. Please follow-up with your PCP and oncologist.

## 2022-10-14 NOTE — Discharge Summary (Signed)
Physician Discharge Summary   Patient: Marcus Burns MRN: [id number] DOB: [birth year]  Admit date:     10/12/2022  Discharge date: 10/14/22  Discharge Physician: Jacquelin Hawking, MD   PCP: Corwin Levins, MD   Recommendations at discharge:  Oncology follow-up for treatment of cancer  Discharge Diagnoses: Principal Problem:   Acute renal failure superimposed on stage 3a chronic kidney disease (HCC) Active Problems:   Acute pulmonary embolism (HCC)   Hypertension   Clear cell carcinoma of kidney (HCC)   Metastatic disease (HCC)   History of pulmonary embolism   Peritoneal carcinomatosis (HCC)   Metastases to the liver [facility])  Resolved Problems:   * No resolved hospital problems. *  Hospital Course: Marcus Burns is a 42 y.o. male with a history of hypertension, pulmonary embolism, clear cell renal cell carcinoma s/p radical nephrectomy.  Patient presented secondary to right flank pain and was found to have evidence of recurrent vs residual tumor related to prior diagnosed clear cell renal cell carcinoma. Pain management provided. Oncology consulted and plan outpatient treatment. Patient also with an AKI which improved with fluids.  Assessment and Plan:  Clear cell renal cell carcinoma Appears to have recurrence vs residual tumor with evidence of likely metastatic disease. Oncology consulted and have started Lenvatinib for treatment with recommendation to continue pembrolizumab. Patient to follow-up as an outpatient.   Metastasis to liver, lung, peritoneum and retroperitoneum Noted on CT imaging.   AKI on CKD stage IIIa Baseline creatinine of about 1.5-1.6. Creatinine of 2.4 on admission. IV fluids started for treatment. Creatinine improved to 2.01 on day of discharge. Holding losartan. Outpatient follow-up.   History of PE Continue Eliquis  Primary hypertension Continue metoprolol. Resume losartan in 4 days to allow adequate renal recovery.  Consultants: Medical  oncology Procedures performed: None  Disposition: Home Diet recommendation: Regular diet   DISCHARGE MEDICATION: Allergies as of 10/14/2022       Reactions   Amlodipine Other (See Comments)   Edema ( 5 mg)   Doxycycline Other (See Comments)   Light headed , passed out        Medication List     STOP taking these medications    HYDROcodone-acetaminophen 5-325 MG tablet Commonly known as: Norco   lidocaine-prilocaine cream Commonly known as: EMLA       TAKE these medications    acetaminophen 500 MG tablet Commonly known as: TYLENOL Take 1 tablet (500 mg total) by mouth every 6 (six) hours as needed for moderate pain (pain.). What changed:  how much to take when to take this   albuterol 108 (90 Base) MCG/ACT inhaler Commonly known as: VENTOLIN HFA Inhale 2 puffs into the lungs every 6 (six) hours as needed for wheezing or shortness of breath.   apixaban 5 MG Tabs tablet Commonly known as: Eliquis Take 1 tablet (5 mg total) by mouth 2 (two) times daily.   cetirizine 10 MG tablet Commonly known as: ZYRTEC Take 10 mg by mouth daily.   clonazePAM 0.5 MG tablet Commonly known as: KLONOPIN Take 1 tablet (0.5 mg total) by mouth 2 (two) times daily as needed for anxiety. What changed: when to take this   docusate sodium 100 MG capsule Commonly known as: COLACE Take 1 capsule (100 mg total) by mouth 2 (two) times daily. What changed:  when to take this reasons to take this   iron polysaccharides 150 MG capsule Commonly known as: Nu-Iron Take 1 capsule (150 mg total) by mouth daily.  What changed: when to take this   lenvatinib 20 mg daily dose 2 x 10 MG capsule Commonly known as: LENVIMA Take 2 capsules (20 mg total) by mouth daily.   metoprolol succinate 25 MG 24 hr tablet Commonly known as: TOPROL-XL TAKE 1 TABLET(25 MG) BY MOUTH DAILY   ondansetron 8 MG tablet Commonly known as: Zofran Take 1 tablet (8 mg total) by mouth every 8 (eight) hours as  needed for nausea or vomiting.   oxyCODONE 5 MG immediate release tablet Commonly known as: Oxy IR/ROXICODONE Take 1 tablet (5 mg total) by mouth every 4 (four) hours as needed for up to 3 days for moderate pain.   prochlorperazine 10 MG tablet Commonly known as: COMPAZINE Take 1 tablet (10 mg total) by mouth every 6 (six) hours as needed for nausea or vomiting.   telmisartan 20 MG tablet Commonly known as: MICARDIS Take 1 tablet (20 mg total) by mouth daily. Start taking on: October 18, 2022 What changed:  See the new instructions. These instructions start on October 18, 2022. If you are unsure what to do until then, ask your doctor or other care provider.        Follow-up Information     Corwin Levins, MD. Schedule an appointment as soon as possible for a visit in 1 week(s).   Specialties: Internal Medicine, Radiology Why: For hospital follow-up Contact information: [address] [place] [address] [phone]                Discharge Exam: BP 137/79 (BP Location: Right Arm)   Pulse 96   Temp 98.5 F (36.9 C) (Oral)   Resp 15   Ht 6\' 4"  (1.93 m)   Wt (!) 147.2 kg   SpO2 95%   BMI 39.50 kg/m   General exam: Appears calm and comfortable Respiratory system: Clear to auscultation. Respiratory effort normal. Cardiovascular system: S1 & S2 heard, RRR. Gastrointestinal system: Abdomen is nondistended, soft and nontender. Normal bowel sounds heard. Central nervous system: Alert and oriented. No focal neurological deficits. Musculoskeletal: No calf tenderness Psychiatry: Judgement and insight appear normal. Mood & affect appropriate.   Condition at discharge: stable  The results of significant diagnostics from this hospitalization (including imaging, microbiology, ancillary and laboratory) are listed below for reference.   Imaging Studies: CT ABDOMEN PELVIS W CONTRAST  Result Date: 10/12/2022 CLINICAL DATA:  History of right kidney removal flank  pain EXAM: CT ABDOMEN AND PELVIS WITH CONTRAST TECHNIQUE: Multidetector CT imaging of the abdomen and pelvis was performed using the standard protocol following bolus administration of intravenous contrast. RADIATION DOSE REDUCTION: This exam was performed according to the departmental dose-optimization program which includes automated exposure control, adjustment of the mA and/or kV according to patient size and/or use of iterative reconstruction technique. CONTRAST:  OMNIPAQUE IOHEXOL 300 MG/ML  SOLN COMPARISON:  CT 06/21/2022, 02/26/2022 FINDINGS: Lower chest: Lung bases demonstrate small right-sided pleural effusion. Bandlike densities at the right middle lobe and right lower lobe likely due to atelectasis. Interim finding of the new 4 mm left lung base pulmonary nodule, series 5, image 54 suspicious for a metastatic focus. Hepatobiliary: Liver is enlarged, measuring 25 cm craniocaudad. Overall heterogeneous appearance with multiple hypodense areas suspicious for metastatic disease. Suspect large metastatic mass involving the caudate lobe of liver with more focal hypodensity in the vicinity of the IVC, probably related to tumor thrombus, series 2, image 44. Triangular shaped gas and fluid collection either within the right hepatic lobe  or abutting its margin given appearance on postoperative CT from June. This area measures roughly 5.4 by 4.5 cm on series 2, image 36. Gallstones. No biliary dilatation. Pancreas: No inflammation or ductal dilatation Spleen: Within normal limits Adrenals/Urinary Tract: History of right adrenalectomy. Left adrenal gland is normal. Left kidney shows no hydronephrosis. Multiple small left kidney stones. Urinary bladder is decompressed. Status post right nephrectomy. Poorly defined infiltrative soft tissue density in the right retroperitoneum, difficult to separate from the inferior margin of right hepatic lobe and suspected to represent residual or recurrent tumor. Extensive  nodular infiltration extending inferiorly within the right retroperitoneum, contiguous with poorly defined density at the right renal fossa. Stomach/Bowel: Stomach nonenlarged. No dilated small bowel. No acute bowel wall thickening Vascular/Lymphatic: Nonaneurysmal aorta. Suspected tumor thrombus in the intra hepatic IVC. Reproductive: Negative prostate Other: No free air. Numerous peritoneal nodules, for example anterior peritoneal nodule deep to midline scar measuring 3.5 x 3.5 cm on series 2 image 65 left upper quadrant stranding and multiple nodules, series 2, image 39 consistent with peritoneal metastatic disease. Multiple additional mesenteric nodules. Diffuse infiltrative nodular appearance of the right greater than left omentum with diffuse peritoneal nodularity on the right. Edema and stranding within the right greater than left flank. Small moderate volume of pelvic ascites. Musculoskeletal: No acute or suspicious osseous abnormality. Nodularity of the right rectus sheath, series 2 image 77 and 78. Suspicious for metastatic disease. IMPRESSION: 1. Status post right nephrectomy and right adrenalectomy. Poorly defined soft tissue density in the expected location of right renal fossa with extensive ill-defined nodular soft tissue thickening in the right retroperitoneum, concerning for residual or recurrent tumor and metastatic disease. 2. Enlarged liver with multiple hypodense areas suspicious for metastatic disease. Suspect large metastatic mass involving the caudate lobe of liver with more focal hypodensity in the vicinity of the IVC, probably related to tumor thrombus. 3. Triangular shaped gas and fluid collection either within the right hepatic lobe or abutting its margin given appearance on postoperative CT from June. This area measures roughly 5.4 by 4.5 cm and is indeterminate for necrotic tumor or infected fluid collection. 4. Small volume abdominopelvic ascites, new compared to prior exam. 5.  Extensive right-sided predominant peritoneal metastatic disease. Small to moderate volume of pelvic ascites. 6. Interim finding of the new 4 mm left lung base pulmonary nodule, suspicious for a metastatic nodule. 7. Small right-sided pleural effusion with right middle lobe and right lower lobe atelectasis. 8. Gallstones. Nonobstructing left kidney stones. Electronically Signed   By: Jasmine Pang M.D.   On: 10/12/2022 23:20    Microbiology: Results for orders placed or performed during the hospital encounter of 06/18/22  MRSA Next Gen by PCR, Nasal     Status: None   Collection Time: 06/18/22  6:04 PM   Specimen: Nasal Mucosa; Nasal Swab  Result Value Ref Range Status   MRSA by PCR Next Gen NOT DETECTED NOT DETECTED Final    Comment: (NOTE) The GeneXpert MRSA Assay (FDA approved for NASAL specimens only), is one component of a comprehensive MRSA colonization surveillance program. It is not intended to diagnose MRSA infection nor to guide or monitor treatment for MRSA infections. Test performance is not FDA approved in patients less than 22 years old. Performed at [facility], [address] [place], [place] [address]   Culture, blood (Routine X 2) w Reflex to ID Panel     Status: None   Collection Time: 06/21/22  7:34 AM  Specimen: BLOOD  Result Value Ref Range Status   Specimen Description   Final    BLOOD BLOOD RIGHT ARM AEROBIC BOTTLE ONLY ANAEROBIC BOTTLE ONLY Performed at [facility], [address], [place], [place] [address]    Special Requests   Final    BOTTLES DRAWN AEROBIC AND ANAEROBIC Blood Culture adequate volume Performed at [facility], [address] [place], [place] [address]    Culture   Final    NO GROWTH 5 DAYS Performed at [facility], [address], [place], [place] [address]    Report Status 06/26/2022 FINAL  Final  Culture, blood (Routine X 2) w Reflex to ID Panel     Status: None    Collection Time: 06/21/22  7:34 AM   Specimen: BLOOD  Result Value Ref Range Status   Specimen Description   Final    BLOOD BLOOD RIGHT HAND AEROBIC BOTTLE ONLY Performed at [facility], [address] [place], [place] [address]    Special Requests   Final    BOTTLES DRAWN AEROBIC ONLY Blood Culture adequate volume Performed at [facility], [address], [place], [place] [address]    Culture   Final    NO GROWTH 5 DAYS Performed at [facility], [address], [place], [place] [address]    Report Status 06/26/2022 FINAL  Final  Urine Culture     Status: Abnormal   Collection Time: 06/21/22 11:20 AM   Specimen: Urine, Clean Catch  Result Value Ref Range Status   Specimen Description   Final    URINE, CLEAN CATCH Performed at [facility], [address], [place], [place] [address]    Special Requests   Final    NONE Performed at [facility], [address], [place], [place] [address]    Culture MULTIPLE SPECIES PRESENT, SUGGEST RECOLLECTION (A)  Final   Report Status 06/22/2022 FINAL  Final    Labs: CBC: Recent Labs  Lab 10/12/22 2111 10/13/22 0414 10/14/22 0419  WBC 7.0 6.3 6.6  HGB 9.2* 8.2* 8.4*  HCT 30.3* 26.9* 28.5*  MCV 88.6 90.6 91.6  PLT 284 214 244   Basic Metabolic Panel: Recent Labs  Lab 10/12/22 2142 10/13/22 0414 10/14/22 0419  NA 138 136 136  K 4.3 4.9 4.5  CL 108 105 105  CO2 21* 21* 21*  GLUCOSE 100* 103* 106*  BUN 29* 27* 23*  CREATININE 2.40* 2.22* 2.01*  CALCIUM 9.2 8.9 8.9   Liver Function Tests: Recent Labs  Lab 10/12/22 2142 10/13/22 0414 10/14/22 0419  AST 86* 74* 72*  ALT 60* 52* 46*  ALKPHOS 129* 117 127*  BILITOT 0.8 0.8 0.6  PROT 8.0 7.3 7.0  ALBUMIN 3.8 3.4* 3.4*     Discharge time spent: 35 minutes.  Signed: Jacquelin Hawking, MD [facility] 10/14/2022

## 2022-10-14 NOTE — Hospital Course (Addendum)
Marcus Burns is a 42 y.o. male with a history of hypertension, pulmonary embolism, clear cell renal cell carcinoma s/p radical nephrectomy.  Patient presented secondary to right flank pain and was found to have evidence of recurrent vs residual tumor related to prior diagnosed clear cell renal cell carcinoma. Pain management provided. Oncology consulted and plan outpatient treatment. Patient also with an AKI which improved with fluids.

## 2022-10-14 NOTE — Plan of Care (Signed)
  Problem: Education: Goal: Knowledge of the prescribed therapeutic regimen will improve Outcome: Progressing   Problem: Pain Managment: Goal: General experience of comfort will improve Outcome: Progressing   Problem: Safety: Goal: Ability to remain free from injury will improve Outcome: Progressing   Problem: Skin Integrity: Goal: Risk for impaired skin integrity will decrease Outcome: Progressing

## 2022-10-14 NOTE — Progress Notes (Addendum)
Lenvatinib 20 mg daily ordered to [facility].  CT chest ordered to be completed in one week. MRI of brain w/wo contrast in 2 weeks.  Referral to genetics.

## 2022-10-14 NOTE — Addendum Note (Signed)
Addended by: Geanie Berlin on: 10/14/2022 09:35 AM   Modules accepted: Orders

## 2022-10-14 NOTE — Telephone Encounter (Addendum)
Oral Oncology Pharmacist Encounter  Received new prescription for Lenvima (lenvatinib) for the treatment of clear cell carcinoma of the kidney in conjunction with pembrolizumab, planned duration until disease progression or unacceptable toxicity.  Labs from 10/14/22 (CBC, CMP) and 09/22/22 (TSH) assessed, no interventions needed. Patient does not have creatinine less than 30 or a child pugh score C to require dose adjustments at this time.  Prescription dose and frequency assessed for appropriateness.   Current medication list in Epic reviewed, no significant/ relevant DDIs with Lenvima identified.   Evaluated chart and no patient barriers to medication adherence noted.   MD spoke to patient while inpatient and per Dr. Remonia Richter note on 10/9 he is agreeable to treatment.  Prescription has been e-scribed to the [facility] for benefits analysis and approval.  Oral Oncology Clinic will continue to follow for insurance authorization, copayment issues, initial counseling and start date.  Bethel Born, PharmD Hematology/Oncology Clinical Pharmacist [facility] Oral Chemotherapy Navigation [facility] [phone] 10/14/2022 8:28 AM

## 2022-10-14 NOTE — Telephone Encounter (Signed)
Oral Oncology Patient Advocate Encounter  Prior Authorization for Marcus Burns has been approved.    PA# [id number] Effective dates: 10/14/22 through 10/14/23  Patient must fill at [facility].    Jinger Neighbors, CPhT-Adv Oncology Pharmacy Patient Advocate [facility] Direct Number: [phone]  Fax: [phone]

## 2022-10-14 NOTE — Progress Notes (Signed)
Marcus Burns   DOB:05-08-1980   ZO#:[id number]    ASSESSMENT & PLAN:  Marcus Burns is a 42 year old pleasant male with history of right renal cell carcinoma with metastatic disease.  Imaging showed peritoneal carcinomatosis, pulmonary nodules, liver metastases.  He was informed that treatment will be palliative.  We discussed adding TKI.  Given his extensive disease, recommend adding lenvatinib as soon as possible.  I discussed potential side effects with him.  We discussed importance of monitoring as outpatient once started new oral medication.  He understands.  Medication has been requested.  Peritoneal carcinomatosis Stage IV renal cell carcinoma, clear-cell type Liver metastases Will start  Lenvatinib 20 mg once daily as soon as authorized and patient can pick up Continue pembro as outpatient  CT chest for staging next week as outpatient May complete MRI of brian as outpatient. Will request genetic testing as outpatient Continue monitor TSH and fT4  AKI on CKD Improving. Fluid goal 64+ oz per day  Liver dysfunction Due to metastases Continue to monitor as outpatient  History of pulmonary embolism Continue Eliquis  Hypertension Continue home medication Informed patient to monitor daily upon discharge after starting TKI  Right-sided pain Currently controlled on pain medication.  Appreciate hospitalist management.  Code Status Full  Discharge planning Follow up with me in a week in clinic  All questions were answered. The patient knows to call the clinic with any problems, questions or concerns.   The total time spent in the appointment was 55 minutes encounter with patients including review of chart and various tests results, discussions about plan of care and coordination of care plan  [name], MD 10/14/2022 9:15 AM  Subjective:  Renal cell carcinoma Patient came in for pain over the right flank area.  Reports pain is better controlled.  He denies any nausea,  vomiting, decreased appetite, or unexpected weight loss.  He denies any bleeding, bloody stool or bloody urine.  He denies any new headaches, weakness, paresthesia.  Otherwise he is feeling well.  No side effects from pembrolizumab.  Objective:  Vitals:   10/13/22 2055 10/14/22 0523  BP: 130/83 137/79  Pulse: 98 96  Resp: 16 15  Temp: 98.8 F (37.1 C) 98.5 F (36.9 C)  SpO2: 94% 95%     Intake/Output Summary (Last 24 hours) at 10/14/2022 0915 Last data filed at 10/13/2022 1748 Gross per 24 hour  Intake 1627.04 ml  Output 500 ml  Net 1127.04 ml    GENERAL: alert, no distress and comfortable SKIN: skin color normal EYES: normal, sclera clear OROPHARYNX: moist, no exudate, no erythema.  NECK: supple, no palpable mass LYMPH:  no palpable cervical lymphadenopathy LUNGS: No wheeze, rales and clear to auscultation bilaterally with normal breathing effort.  HEART: regular rate & rhythm  ABDOMEN: abdomen soft, non-tender and non-distended Musculoskeletal: no lower extremity edema NEURO: alert with fluent speech, no focal motor/sensory deficits Strength and sensation equal bilaterally.  Alternating hand movement intact bilaterally   Labs:  Recent Labs    01/22/22 1037 03/10/22 1120 03/11/22 0146 03/22/22 0952 05/10/22 0705 10/12/22 2142 10/13/22 0414 10/14/22 0419  NA 140 141   < > 140   < > 138 136 136  K 4.0 4.3   < > 4.3   < > 4.3 4.9 4.5  CL 101 102   < > 101   < > 108 105 105  CO2 28 29   < > 27   < > 21* 21* 21*  GLUCOSE 103*  101*   < > 96   < > 100* 103* 106*  BUN 12 11   < > 14   < > 29* 27* 23*  CREATININE 1.16 1.25   < > 1.37   < > 2.40* 2.22* 2.01*  CALCIUM 9.8 10.4   < > 10.2   < > 9.2 8.9 8.9  GFRNONAA  --   --    < >  --    < > 33* 37* 41*  PROT 7.8 8.4*   < > 8.6*   < > 8.0 7.3 7.0  ALBUMIN 4.3 4.0   < > 3.9   < > 3.8 3.4* 3.4*  AST 18 14   < > 17   < > 86* 74* 72*  ALT 18 11   < > 14   < > 60* 52* 46*  ALKPHOS 80 79   < > 71   < > 129* 117 127*   BILITOT 0.8 1.0   < > 0.5   < > 0.8 0.8 0.6  BILIDIR 0.1 0.2  --  0.1  --   --   --   --    < > = values in this interval not displayed.    Studies:  CT ABDOMEN PELVIS W CONTRAST  Result Date: 10/12/2022 CLINICAL DATA:  History of right kidney removal flank pain EXAM: CT ABDOMEN AND PELVIS WITH CONTRAST TECHNIQUE: Multidetector CT imaging of the abdomen and pelvis was performed using the standard protocol following bolus administration of intravenous contrast. RADIATION DOSE REDUCTION: This exam was performed according to the departmental dose-optimization program which includes automated exposure control, adjustment of the mA and/or kV according to patient size and/or use of iterative reconstruction technique. CONTRAST:  OMNIPAQUE IOHEXOL 300 MG/ML  SOLN COMPARISON:  CT 06/21/2022, 02/26/2022 FINDINGS: Lower chest: Lung bases demonstrate small right-sided pleural effusion. Bandlike densities at the right middle lobe and right lower lobe likely due to atelectasis. Interim finding of the new 4 mm left lung base pulmonary nodule, series 5, image 54 suspicious for a metastatic focus. Hepatobiliary: Liver is enlarged, measuring 25 cm craniocaudad. Overall heterogeneous appearance with multiple hypodense areas suspicious for metastatic disease. Suspect large metastatic mass involving the caudate lobe of liver with more focal hypodensity in the vicinity of the IVC, probably related to tumor thrombus, series 2, image 44. Triangular shaped gas and fluid collection either within the right hepatic lobe or abutting its margin given appearance on postoperative CT from June. This area measures roughly 5.4 by 4.5 cm on series 2, image 36. Gallstones. No biliary dilatation. Pancreas: No inflammation or ductal dilatation Spleen: Within normal limits Adrenals/Urinary Tract: History of right adrenalectomy. Left adrenal gland is normal. Left kidney shows no hydronephrosis. Multiple small left kidney stones. Urinary  bladder is decompressed. Status post right nephrectomy. Poorly defined infiltrative soft tissue density in the right retroperitoneum, difficult to separate from the inferior margin of right hepatic lobe and suspected to represent residual or recurrent tumor. Extensive nodular infiltration extending inferiorly within the right retroperitoneum, contiguous with poorly defined density at the right renal fossa. Stomach/Bowel: Stomach nonenlarged. No dilated small bowel. No acute bowel wall thickening Vascular/Lymphatic: Nonaneurysmal aorta. Suspected tumor thrombus in the intra hepatic IVC. Reproductive: Negative prostate Other: No free air. Numerous peritoneal nodules, for example anterior peritoneal nodule deep to midline scar measuring 3.5 x 3.5 cm on series 2 image 65 left upper quadrant stranding and multiple nodules, series 2, image 39 consistent with peritoneal metastatic  disease. Multiple additional mesenteric nodules. Diffuse infiltrative nodular appearance of the right greater than left omentum with diffuse peritoneal nodularity on the right. Edema and stranding within the right greater than left flank. Small moderate volume of pelvic ascites. Musculoskeletal: No acute or suspicious osseous abnormality. Nodularity of the right rectus sheath, series 2 image 77 and 78. Suspicious for metastatic disease. IMPRESSION: 1. Status post right nephrectomy and right adrenalectomy. Poorly defined soft tissue density in the expected location of right renal fossa with extensive ill-defined nodular soft tissue thickening in the right retroperitoneum, concerning for residual or recurrent tumor and metastatic disease. 2. Enlarged liver with multiple hypodense areas suspicious for metastatic disease. Suspect large metastatic mass involving the caudate lobe of liver with more focal hypodensity in the vicinity of the IVC, probably related to tumor thrombus. 3. Triangular shaped gas and fluid collection either within the right  hepatic lobe or abutting its margin given appearance on postoperative CT from June. This area measures roughly 5.4 by 4.5 cm and is indeterminate for necrotic tumor or infected fluid collection. 4. Small volume abdominopelvic ascites, new compared to prior exam. 5. Extensive right-sided predominant peritoneal metastatic disease. Small to moderate volume of pelvic ascites. 6. Interim finding of the new 4 mm left lung base pulmonary nodule, suspicious for a metastatic nodule. 7. Small right-sided pleural effusion with right middle lobe and right lower lobe atelectasis. 8. Gallstones. Nonobstructing left kidney stones. Electronically Signed   By: Jasmine Pang M.D.   On: 10/12/2022 23:20

## 2022-10-15 ENCOUNTER — Telehealth: Payer: Self-pay

## 2022-10-15 ENCOUNTER — Inpatient Hospital Stay: Payer: BC Managed Care – PPO

## 2022-10-15 VITALS — BP 142/91 | HR 99 | Temp 98.2°F | Resp 18 | Wt 325.8 lb

## 2022-10-15 DIAGNOSIS — C641 Malignant neoplasm of right kidney, except renal pelvis: Secondary | ICD-10-CM

## 2022-10-15 DIAGNOSIS — I10 Essential (primary) hypertension: Secondary | ICD-10-CM | POA: Diagnosis not present

## 2022-10-15 DIAGNOSIS — Z5112 Encounter for antineoplastic immunotherapy: Secondary | ICD-10-CM | POA: Diagnosis present

## 2022-10-15 DIAGNOSIS — C787 Secondary malignant neoplasm of liver and intrahepatic bile duct: Secondary | ICD-10-CM | POA: Diagnosis not present

## 2022-10-15 DIAGNOSIS — Z79620 Long term (current) use of immunosuppressive biologic: Secondary | ICD-10-CM | POA: Diagnosis not present

## 2022-10-15 DIAGNOSIS — I2699 Other pulmonary embolism without acute cor pulmonale: Secondary | ICD-10-CM

## 2022-10-15 DIAGNOSIS — C786 Secondary malignant neoplasm of retroperitoneum and peritoneum: Secondary | ICD-10-CM | POA: Diagnosis not present

## 2022-10-15 LAB — CMP (CANCER CENTER ONLY)
ALT: 41 U/L (ref 0–44)
AST: 73 U/L — ABNORMAL HIGH (ref 15–41)
Albumin: 3.7 g/dL (ref 3.5–5.0)
Alkaline Phosphatase: 139 U/L — ABNORMAL HIGH (ref 38–126)
Anion gap: 5 (ref 5–15)
BUN: 22 mg/dL — ABNORMAL HIGH (ref 6–20)
CO2: 25 mmol/L (ref 22–32)
Calcium: 9.3 mg/dL (ref 8.9–10.3)
Chloride: 107 mmol/L (ref 98–111)
Creatinine: 2.06 mg/dL — ABNORMAL HIGH (ref 0.61–1.24)
GFR, Estimated: 40 mL/min — ABNORMAL LOW (ref >60)
Glucose, Bld: 100 mg/dL — ABNORMAL HIGH (ref 70–99)
Potassium: 4.4 mmol/L (ref 3.5–5.1)
Sodium: 137 mmol/L (ref 135–145)
Total Bilirubin: 0.7 mg/dL (ref 0.3–1.2)
Total Protein: 7.2 g/dL (ref 6.5–8.1)

## 2022-10-15 LAB — CBC WITH DIFFERENTIAL (CANCER CENTER ONLY)
Abs Immature Granulocytes: 0.01 10*3/uL (ref 0.00–0.07)
Basophils Absolute: 0 10*3/uL (ref 0.0–0.1)
Basophils Relative: 0 %
Eosinophils Absolute: 0.7 10*3/uL — ABNORMAL HIGH (ref 0.0–0.5)
Eosinophils Relative: 10 %
HCT: 28.6 % — ABNORMAL LOW (ref 39.0–52.0)
Hemoglobin: 9 g/dL — ABNORMAL LOW (ref 13.0–17.0)
Immature Granulocytes: 0 %
Lymphocytes Relative: 21 %
Lymphs Abs: 1.4 10*3/uL (ref 0.7–4.0)
MCH: 27.1 pg (ref 26.0–34.0)
MCHC: 31.5 g/dL (ref 30.0–36.0)
MCV: 86.1 fL (ref 80.0–100.0)
Monocytes Absolute: 0.6 10*3/uL (ref 0.1–1.0)
Monocytes Relative: 9 %
Neutro Abs: 4 10*3/uL (ref 1.7–7.7)
Neutrophils Relative %: 60 %
Platelet Count: 255 10*3/uL (ref 150–400)
RBC: 3.32 MIL/uL — ABNORMAL LOW (ref 4.22–5.81)
RDW: 16.1 % — ABNORMAL HIGH (ref 11.5–15.5)
WBC Count: 6.8 10*3/uL (ref 4.0–10.5)
nRBC: 0 % (ref 0.0–0.2)

## 2022-10-15 LAB — T4, FREE: Free T4: 0.96 ng/dL (ref 0.61–1.12)

## 2022-10-15 MED ORDER — SODIUM CHLORIDE 0.9 % IV SOLN
200.0000 mg | Freq: Once | INTRAVENOUS | Status: AC
  Administered 2022-10-15: 200 mg via INTRAVENOUS
  Filled 2022-10-15: qty 200

## 2022-10-15 MED ORDER — HEPARIN SOD (PORK) LOCK FLUSH 100 UNIT/ML IV SOLN: 500.0000 [IU] | Freq: Once | INTRAVENOUS | Status: DC | PRN

## 2022-10-15 MED ORDER — SODIUM CHLORIDE 0.9% FLUSH: 10.0000 mL | INTRAVENOUS | Status: DC | PRN

## 2022-10-15 MED ORDER — SODIUM CHLORIDE 0.9 % IV SOLN: Freq: Once | INTRAVENOUS | Status: AC

## 2022-10-15 NOTE — Telephone Encounter (Signed)
Per staff message sent on 10/11 patient is transferring from [place] to [place] [facility] and is aware of scheduled follow up times/dates; patient is also aware that appointments were already schedule will remain as is

## 2022-10-15 NOTE — Telephone Encounter (Signed)
Oral Chemotherapy Pharmacist Encounter  I spoke with patient for overview of: Lenvima (lenvatinib) for the treatment of clear cell carcinoma of the kidney in conjunction with pembrolizumab, planned duration until disease progression or unacceptable toxicity.   Counseled patient on administration, dosing, side effects, monitoring, drug-food interactions, safe handling, storage, and disposal.  Patient will take Lenvima 10mg  capsules, 2 capsules (20mg ) by mouth once daily, with or without food, at approximately the same time each day.  Lenvima start date: 10/15/2022  Adverse effects include but are not limited to: hypertension, hand-foot syndrome, diarrhea, joint pain, fatigue, headache, decreased calcium, proteinuria, increased risk of blood clots, and cardiac conduction issues.   Patient will obtain anti diarrheal and alert the office of 4 or more loose stools above baseline.  Patient instructed to notify office of any upcoming invasive procedures.  Assunta Curtis will be held for 6 days prior to scheduled surgery, restart based on healing and clinical judgement.   Reviewed with patient importance of keeping a medication schedule and plan for any missed doses. No barriers to medication adherence identified.  Medication reconciliation performed and medication/allergy list updated.  Insurance authorization for Assunta Curtis has been obtained. [facility].  Patient informed the pharmacy will reach out 5-7 days prior to needing next fill of Lenvima to coordinate continued medication acquisition to prevent break in therapy.  All questions answered.  Patient voiced understanding and appreciation.   Medication education handout placed in mail for patient. Patient knows to call the office with questions or concerns. Oral Chemotherapy Clinic phone number provided to patient.   Bethel Born, PharmD Hematology/Oncology Clinical Pharmacist [facility] Oral Chemotherapy  [facility] [phone] 10/15/2022   3:54 PM

## 2022-10-15 NOTE — Progress Notes (Signed)
Per Cherly Hensen MD, ok to treat with SCR 2.06 today

## 2022-10-15 NOTE — Patient Instructions (Signed)
Marcus Burns  Discharge Instructions: Thank you for choosing [facility] to provide your oncology and hematology care.   If you have a lab appointment with the [facility], please go directly to the [facility] and check in at the registration area.   Wear comfortable clothing and clothing appropriate for easy access to any Portacath or PICC line.   We strive to give you quality time with your provider. You may need to reschedule your appointment if you arrive late (15 or more minutes).  Arriving late affects you and other patients whose appointments are after yours.  Also, if you miss three or more appointments without notifying the office, you may be dismissed from the clinic at the provider's discretion.      For prescription refill requests, have your pharmacy contact our office and allow 72 hours for refills to be completed.    Today you received the following chemotherapy and/or immunotherapy agents: Keytruda      To help prevent nausea and vomiting after your treatment, we encourage you to take your nausea medication as directed.  BELOW ARE SYMPTOMS THAT SHOULD BE REPORTED IMMEDIATELY: *FEVER GREATER THAN 100.4 F (38 C) OR HIGHER *CHILLS OR SWEATING *NAUSEA AND VOMITING THAT IS NOT CONTROLLED WITH YOUR NAUSEA MEDICATION *UNUSUAL SHORTNESS OF BREATH *UNUSUAL BRUISING OR BLEEDING *URINARY PROBLEMS (pain or burning when urinating, or frequent urination) *BOWEL PROBLEMS (unusual diarrhea, constipation, pain near the anus) TENDERNESS IN MOUTH AND THROAT WITH OR WITHOUT PRESENCE OF ULCERS (sore throat, sores in mouth, or a toothache) UNUSUAL RASH, SWELLING OR PAIN  UNUSUAL VAGINAL DISCHARGE OR ITCHING   Items with * indicate a potential emergency and should be followed up as soon as possible or go to the Emergency Department if any problems should occur.  Please show the CHEMOTHERAPY ALERT CARD or IMMUNOTHERAPY ALERT CARD at  check-in to the Emergency Department and triage nurse.  Should you have questions after your visit or need to cancel or reschedule your appointment, please contact [facility]  Dept: [phone]  and follow the prompts.  Office hours are 8:00 a.m. to 4:30 p.m. Monday - Friday. Please note that voicemails left after 4:00 p.m. may not be returned until the following business day.  We are closed weekends and major holidays. You have access to a nurse at all times for urgent questions. Please call the main number to the clinic Dept: [phone] and follow the prompts.   For any non-urgent questions, you may also contact your provider using MyChart. We now offer e-Visits for anyone 14 and older to request care online for non-urgent symptoms. For details visit [email or web address].   Also download the MyChart app! Go to the app store, search "MyChart", open the app, select [facility], and log in with your MyChart username and password.

## 2022-10-16 ENCOUNTER — Telehealth: Payer: Self-pay

## 2022-10-16 NOTE — Telephone Encounter (Signed)
Called to schedule appointment per referral. At this time, the patient would like to defer getting the genetic counseling appointment scheduled. Will defer starting with 2 weeks.

## 2022-10-18 ENCOUNTER — Other Ambulatory Visit: Payer: Self-pay

## 2022-10-18 ENCOUNTER — Encounter: Payer: Self-pay | Admitting: Internal Medicine

## 2022-10-18 ENCOUNTER — Encounter: Payer: Self-pay | Admitting: Hematology and Oncology

## 2022-10-18 ENCOUNTER — Ambulatory Visit: Payer: BC Managed Care – PPO | Admitting: Internal Medicine

## 2022-10-18 VITALS — BP 138/96 | HR 91 | Temp 98.9°F | Ht 76.0 in | Wt 321.6 lb

## 2022-10-18 DIAGNOSIS — R739 Hyperglycemia, unspecified: Secondary | ICD-10-CM | POA: Diagnosis not present

## 2022-10-18 DIAGNOSIS — N1831 Chronic kidney disease, stage 3a: Secondary | ICD-10-CM | POA: Diagnosis not present

## 2022-10-18 DIAGNOSIS — C641 Malignant neoplasm of right kidney, except renal pelvis: Secondary | ICD-10-CM

## 2022-10-18 DIAGNOSIS — E559 Vitamin D deficiency, unspecified: Secondary | ICD-10-CM | POA: Diagnosis not present

## 2022-10-18 DIAGNOSIS — N179 Acute kidney failure, unspecified: Secondary | ICD-10-CM

## 2022-10-18 DIAGNOSIS — D649 Anemia, unspecified: Secondary | ICD-10-CM

## 2022-10-18 DIAGNOSIS — I10 Essential (primary) hypertension: Secondary | ICD-10-CM | POA: Diagnosis not present

## 2022-10-18 DIAGNOSIS — R7303 Prediabetes: Secondary | ICD-10-CM

## 2022-10-18 MED ORDER — METOPROLOL SUCCINATE ER 50 MG PO TB24: 50.0000 mg | ORAL_TABLET | Freq: Every day | ORAL | 3 refills | Status: DC

## 2022-10-18 NOTE — Patient Instructions (Signed)
Ok to continue to hold the telmisartan for now  Please take all new medication as prescribed - the increased toprol xl 50 mg per day  Please call in 1-2 wks if the BP is still > 140/90  Please continue all other medications as before, and refills have been done if requested.  Please have the pharmacy call with any other refills you may need.  Please continue your efforts at being more active, low cholesterol diet, and weight control.  Please keep your appointments with your specialists as you may have planned  You will be contacted regarding the referral for: nutrition  Please make an Appointment to return in 6 months, or sooner if needed

## 2022-10-18 NOTE — Progress Notes (Unsigned)
Patient ID: JAQUAIN APPLEGATE, male   DOB: 04-11-1980, 42 y.o.   MRN: [id number]        Chief Complaint: follow up preDM, htn, AKI on CKD, hyperglycemia, low vit d       HPI:  MARKEAL DELCONTE is a 42 y.o. male here in f/u after ED visit with AKI on CKD with low volume and ARB stopped, Pt denies chest pain, increased sob or doe, wheezing, orthopnea, PND, increased LE swelling, palpitations, dizziness or syncope.   Pt denies polydipsia, polyuria, or new focal neuro s/s.    Pt denies fever, wt loss, night sweats, loss of appetite, or other constitutional symptoms        Wt Readings from Last 3 Encounters:  10/18/22 (!) 321 lb 9.6 oz (145.9 kg)  10/15/22 (!) 325 lb 12.8 oz (147.8 kg)  10/13/22 (!) 324 lb 8.3 oz (147.2 kg)   BP Readings from Last 3 Encounters:  10/18/22 (!) 138/96  10/15/22 (!) 142/91  10/14/22 137/79         Past Medical History:  Diagnosis Date   Acute prostatitis 06/19/2007   ALLERGIC RHINITIS 06/19/2007   ANXIETY 06/19/2007   Cancer (HCC)    kidney   ELEVATED BLOOD PRESSURE WITHOUT DIAGNOSIS OF HYPERTENSION 06/19/2007   GERD 06/19/2007   GERD (gastroesophageal reflux disease)    History of COVID-19 01/24/2019   History of kidney stones    Hypertension    Inguinal hernia    Migraines    NEPHROLITHIASIS, HX OF 06/19/2007   PE (pulmonary thromboembolism) (HCC)    Past Surgical History:  Procedure Laterality Date   CYSTOSCOPY WITH RETROGRADE PYELOGRAM, URETEROSCOPY AND STENT PLACEMENT Bilateral 02/09/2019   Procedure: CYSTOSCOPY WITH RETROGRADE PYELOGRAM, DIAGNOSTIC URETEROSCOPY AND STENT PLACEMENT;  Surgeon: Sebastian Ache, MD;  Location: [facility];  Service: Urology;  Laterality: Bilateral;  75 MINS   CYSTOSCOPY WITH RETROGRADE PYELOGRAM, URETEROSCOPY AND STENT PLACEMENT Bilateral 02/23/2019   Procedure: CYSTOSCOPY WITH RETROGRADE PYELOGRAM, URETEROSCOPY AND STENT PLACEMENT;  Surgeon: Sebastian Ache, MD;  Location: [facility];  Service: Urology;  Laterality: Bilateral;   1 HR   HERNIA REPAIR     HOLMIUM LASER APPLICATION Bilateral 02/23/2019   Procedure: HOLMIUM LASER APPLICATION;  Surgeon: Sebastian Ache, MD;  Location: [facility];  Service: Urology;  Laterality: Bilateral;   ROBOT ASSISTED LAPAROSCOPIC NEPHRECTOMY Right 06/18/2022   Procedure: XI ROBOTIC ASSISTED LAPAROSCOPIC RADICAL NEPHRECTOMY AND PERICAVAL LYMPH NODE DISSECTION;  Surgeon: Loletta Parish., MD;  Location: [facility];  Service: Urology;  Laterality: Right;  3 HRS    reports that he has quit smoking. He has quit using smokeless tobacco. He reports that he does not drink alcohol and does not use drugs. family history includes Anxiety disorder in his brother and mother; Coronary artery disease in his father; Diabetes in his brother. Allergies  Allergen Reactions   Amlodipine Other (See Comments)    Edema ( 5 mg)   Doxycycline Other (See Comments)    Light headed , passed out   Current Outpatient Medications on File Prior to Visit  Medication Sig Dispense Refill   acetaminophen (TYLENOL) 500 MG tablet Take 1 tablet (500 mg total) by mouth every 6 (six) hours as needed for moderate pain (pain.).     albuterol (VENTOLIN HFA) 108 (90 Base) MCG/ACT inhaler Inhale 2 puffs into the lungs every 6 (six) hours as needed for wheezing or shortness of breath. 8 g 1   apixaban (ELIQUIS) 5 MG TABS tablet Take 1  tablet (5 mg total) by mouth 2 (two) times daily. 60 tablet 6   cetirizine (ZYRTEC) 10 MG tablet Take 10 mg by mouth daily.     clonazePAM (KLONOPIN) 0.5 MG tablet Take 1 tablet (0.5 mg total) by mouth 2 (two) times daily as needed for anxiety. (Patient taking differently: Take 0.5 mg by mouth at bedtime.) 60 tablet 1   docusate sodium (COLACE) 100 MG capsule Take 1 capsule (100 mg total) by mouth 2 (two) times daily. (Patient taking differently: Take 100 mg by mouth as needed for mild constipation or moderate constipation.)     iron polysaccharides (NU-IRON) 150 MG capsule Take 1 capsule (150 mg total)  by mouth daily. (Patient taking differently: Take 150 mg by mouth every other day.) 90 capsule 1   lenvatinib 20 mg daily dose (LENVIMA) 2 x 10 MG capsule Take 2 capsules (20 mg total) by mouth daily. 60 capsule 11   ondansetron (ZOFRAN) 8 MG tablet Take 1 tablet (8 mg total) by mouth every 8 (eight) hours as needed for nausea or vomiting. 30 tablet 1   oxyCODONE (OXY IR/ROXICODONE) 5 MG immediate release tablet Take 5 mg by mouth every 4 (four) hours as needed for severe pain.     prochlorperazine (COMPAZINE) 10 MG tablet Take 1 tablet (10 mg total) by mouth every 6 (six) hours as needed for nausea or vomiting. 30 tablet 1   [DISCONTINUED] triamcinolone (NASACORT) 55 MCG/ACT AERO nasal inhaler Place 2 sprays into the nose daily. (Patient not taking: Reported on 02/01/2019) 1 Inhaler 12   No current facility-administered medications on file prior to visit.        ROS:  All others reviewed and negative.  Objective        PE:  BP (!) 138/96 (BP Location: Left Arm, Patient Position: Sitting, Cuff Size: Large)   Pulse 91   Temp 98.9 F (37.2 C) (Oral)   Ht 6\' 4"  (1.93 m)   Wt (!) 321 lb 9.6 oz (145.9 kg)   SpO2 97%   BMI 39.15 kg/m                 Constitutional: Pt appears in NAD               HENT: Head: NCAT.                Right Ear: External ear normal.                 Left Ear: External ear normal.                Eyes: . Pupils are equal, round, and reactive to light. Conjunctivae and EOM are normal               Nose: without d/c or deformity               Neck: Neck supple. Gross normal ROM               Cardiovascular: Normal rate and regular rhythm.                 Pulmonary/Chest: Effort normal and breath sounds without rales or wheezing.                Abd:  Soft, NT, ND, + BS, no organomegaly               Neurological: Pt is alert. At baseline orientation, motor grossly intact  Skin: Skin is warm. No rashes, no other new lesions, LE edema - none                Psychiatric: Pt behavior is normal without agitation   Micro: none  Cardiac tracings I have personally interpreted today:  none  Pertinent Radiological findings (summarize): none   Lab Results  Component Value Date   WBC 6.8 10/15/2022   HGB 9.0 (L) 10/15/2022   HCT 28.6 (L) 10/15/2022   PLT 255 10/15/2022   GLUCOSE 100 (H) 10/15/2022   CHOL 229 (H) 01/22/2022   TRIG 329.0 (H) 01/22/2022   HDL 38.20 (L) 01/22/2022   LDLDIRECT 129.0 01/22/2022   LDLCALC 150 (H) 01/01/2021   ALT 41 10/15/2022   AST 73 (H) 10/15/2022   NA 137 10/15/2022   K 4.4 10/15/2022   CL 107 10/15/2022   CREATININE 2.06 (H) 10/15/2022   BUN 22 (H) 10/15/2022   CO2 25 10/15/2022   TSH 3.725 10/14/2022   PSA 0.82 01/22/2022   INR 1.1 06/21/2022   HGBA1C 6.4 01/22/2022   MICROALBUR <0.7 01/01/2021   Assessment/Plan:  MCCAIN BUCHBERGER is a 42 y.o. Black or African American [2] male with  has a past medical history of Acute prostatitis (06/19/2007), ALLERGIC RHINITIS (06/19/2007), ANXIETY (06/19/2007), Cancer (HCC), ELEVATED BLOOD PRESSURE WITHOUT DIAGNOSIS OF HYPERTENSION (06/19/2007), GERD (06/19/2007), GERD (gastroesophageal reflux disease), History of COVID-19 (01/24/2019), History of kidney stones, Hypertension, Inguinal hernia, Migraines, NEPHROLITHIASIS, HX OF (06/19/2007), and PE (pulmonary thromboembolism) (HCC).  Acute renal failure superimposed on stage 3a chronic kidney disease (HCC) Now s/p stopped ARB and improved fluid intake - for f/u lab  Hyperglycemia Lab Results  Component Value Date   HGBA1C 6.4 01/22/2022   Stable, pt to continue current medical treatment  - diet, wt control, and refer nutrition    Hypertension BP Readings from Last 3 Encounters:  10/18/22 (!) 138/96  10/15/22 (!) 142/91  10/14/22 137/79   Uncontrolled, pt to increase the toprol xl 50 every day   Vitamin D deficiency Last vitamin D Lab Results  Component Value Date   VD25OH 10.70 (L) 01/22/2022   Low,  to start oral replacement  Followup: Return in about 6 months (around 04/18/2023).  Oliver Barre, MD 10/20/2022 9:42 PM [facility]

## 2022-10-19 ENCOUNTER — Ambulatory Visit (HOSPITAL_COMMUNITY): Payer: BC Managed Care – PPO

## 2022-10-20 ENCOUNTER — Ambulatory Visit (HOSPITAL_COMMUNITY): Payer: BC Managed Care – PPO

## 2022-10-20 ENCOUNTER — Encounter: Payer: Self-pay | Admitting: Internal Medicine

## 2022-10-20 DIAGNOSIS — G893 Neoplasm related pain (acute) (chronic): Secondary | ICD-10-CM | POA: Insufficient documentation

## 2022-10-20 NOTE — Assessment & Plan Note (Signed)
Last vitamin D Lab Results  Component Value Date   VD25OH 10.70 (L) 01/22/2022   Low, to start oral replacement

## 2022-10-20 NOTE — Assessment & Plan Note (Deleted)
-   Continue Eliquis 

## 2022-10-20 NOTE — Assessment & Plan Note (Addendum)
Lab Results  Component Value Date   HGBA1C 6.4 01/22/2022   Stable, pt to continue current medical treatment  - diet, wt control, and refer nutrition

## 2022-10-20 NOTE — Assessment & Plan Note (Deleted)
Cr stable.  Fluid goal 70 oz/day Continue monitor

## 2022-10-20 NOTE — Assessment & Plan Note (Deleted)
On metoprolol 50 mg daily Continue home medication Informed patient to monitor daily upon discharge after starting TKI

## 2022-10-20 NOTE — Assessment & Plan Note (Signed)
BP Readings from Last 3 Encounters:  10/18/22 (!) 138/96  10/15/22 (!) 142/91  10/14/22 137/79   Uncontrolled, pt to increase the toprol xl 50 every day

## 2022-10-20 NOTE — Assessment & Plan Note (Deleted)
-  Continue Eliquis

## 2022-10-20 NOTE — Progress Notes (Deleted)
Patient Care Team: Marcus Levins, MD as PCP - General  Clinic Day:  10/20/2022  Referring physician: Corwin Levins, MD  ASSESSMENT & PLAN:   Assessment & Plan: Marcus Burns is a 42 year old pleasant male with history of right renal cell carcinoma with metastatic disease.  Imaging showed peritoneal carcinomatosis, pulmonary nodules, liver metastases.  He was informed that treatment will be palliative.  We discussed adding TKI.  Given his extensive disease, recommend adding lenvatinib as soon as possible.  I discussed potential side effects with him.  We discussed importance of monitoring as outpatient once started new oral medication.  He understands.  Medication has been requested.  Metastatic disease (HCC) Stage IV renal cell carcinoma, clear-cell type with peritoneal carcinomatosis, liver metastases 10/15/22 started Lenvatinib 20 mg once daily  Continue pembro every 3 weeks May complete MRI of brian. Will request genetic testing as outpatient Continue monitor TSH and fT4  Acute renal failure superimposed on stage 3a chronic kidney disease (HCC) Cr stable.  Fluid goal 70 oz/day Continue monitor  History of pulmonary embolism Continue Eliquis  Hypertension On metoprolol 50 mg daily Continue home medication Informed patient to monitor daily upon discharge after starting TKI  Cancer associated pain Right-sided pain Currently controlled on pain medication.  Appreciate hospitalist management.       The patient understands the plans discussed today and is in agreement with them.  He knows to contact our office if he develops concerns prior to his next appointment.  I provided *** minutes of face-to-face time during this encounter and > 50% was spent counseling as documented under my assessment and plan.    Marcus Sartorius, MD  [facility] [address] [place] [address] Dept: [phone] Dept Fax:  [phone]   No orders of the defined types were placed in this encounter.     CHIEF COMPLAINT:  CC: ***  Current Treatment:  ***  INTERVAL HISTORY:  Marcus Burns is here today for repeat clinical assessment. He denies fevers or chills. He denies pain. His appetite is good. His weight {Weight change:10426}.  I have reviewed the past medical history, past surgical history, social history and family history with the patient and they are unchanged from previous note.  ALLERGIES:  is allergic to amlodipine and doxycycline.  MEDICATIONS:  Current Outpatient Medications  Medication Sig Dispense Refill   acetaminophen (TYLENOL) 500 MG tablet Take 1 tablet (500 mg total) by mouth every 6 (six) hours as needed for moderate pain (pain.).     albuterol (VENTOLIN HFA) 108 (90 Base) MCG/ACT inhaler Inhale 2 puffs into the lungs every 6 (six) hours as needed for wheezing or shortness of breath. 8 g 1   apixaban (ELIQUIS) 5 MG TABS tablet Take 1 tablet (5 mg total) by mouth 2 (two) times daily. 60 tablet 6   cetirizine (ZYRTEC) 10 MG tablet Take 10 mg by mouth daily.     clonazePAM (KLONOPIN) 0.5 MG tablet Take 1 tablet (0.5 mg total) by mouth 2 (two) times daily as needed for anxiety. (Patient taking differently: Take 0.5 mg by mouth at bedtime.) 60 tablet 1   docusate sodium (COLACE) 100 MG capsule Take 1 capsule (100 mg total) by mouth 2 (two) times daily. (Patient taking differently: Take 100 mg by mouth as needed for mild constipation or moderate constipation.)     iron polysaccharides (NU-IRON) 150 MG capsule Take 1 capsule (150 mg total) by mouth daily. (  Patient taking differently: Take 150 mg by mouth every other day.) 90 capsule 1   lenvatinib 20 mg daily dose (LENVIMA) 2 x 10 MG capsule Take 2 capsules (20 mg total) by mouth daily. 60 capsule 11   metoprolol succinate (TOPROL-XL) 50 MG 24 hr tablet Take 1 tablet (50 mg total) by mouth daily. Take with or immediately following a meal. 90 tablet 3    ondansetron (ZOFRAN) 8 MG tablet Take 1 tablet (8 mg total) by mouth every 8 (eight) hours as needed for nausea or vomiting. 30 tablet 1   oxyCODONE (OXY IR/ROXICODONE) 5 MG immediate release tablet Take 5 mg by mouth every 4 (four) hours as needed for severe pain.     prochlorperazine (COMPAZINE) 10 MG tablet Take 1 tablet (10 mg total) by mouth every 6 (six) hours as needed for nausea or vomiting. 30 tablet 1   No current facility-administered medications for this visit.    HISTORY OF PRESENT ILLNESS:   Oncology History  Clear cell carcinoma of kidney (HCC)  07/16/2022 Initial Diagnosis   Clear cell carcinoma of kidney (HCC)   08/11/2022 -  Chemotherapy   Patient is on Treatment Plan : RENAL Pembrolizumab (200) q21d         REVIEW OF SYSTEMS:   Constitutional: Denies fevers, chills or abnormal weight loss Eyes: Denies blurriness of vision Ears, nose, mouth, throat, and face: Denies mucositis or sore throat Respiratory: Denies cough, dyspnea or wheezes Cardiovascular: Denies palpitation, chest discomfort or lower extremity swelling Gastrointestinal:  Denies nausea, heartburn or change in bowel habits Skin: Denies abnormal skin rashes Lymphatics: Denies new lymphadenopathy or easy bruising Neurological:Denies numbness, tingling or new weaknesses Behavioral/Psych: Mood is stable, no new changes  All other systems were reviewed with the patient and are negative.   VITALS:  There were no vitals taken for this visit.  Wt Readings from Last 3 Encounters:  10/18/22 (!) 321 lb 9.6 oz (145.9 kg)  10/15/22 (!) 325 lb 12.8 oz (147.8 kg)  10/13/22 (!) 324 lb 8.3 oz (147.2 kg)    There is no height or weight on file to calculate BMI.  Performance status (ECOG): {CHL ONC [id number]  PHYSICAL EXAM:   GENERAL:alert, no distress and comfortable SKIN: skin color normal, no rashes  EYES: normal, sclera clear OROPHARYNX: no exudate, no erythema    NECK: supple,  non-tender,  without nodularity LYMPH:  no palpable cervical lymphadenopathy LUNGS: clear to auscultation with normal breathing effort.  No wheeze or rales HEART: regular rate & rhythm and no murmurs and no lower extremity edema ABDOMEN: abdomen soft, non-tender and nondistended Musculoskeletal: no edema NEURO: alert, fluent speech, no focal motor/sensory deficits.  Strength and sensation equal bilaterally.  LABORATORY DATA:  I have reviewed the data as listed    Component Value Date/Time   NA 137 10/15/2022 1414   K 4.4 10/15/2022 1414   CL 107 10/15/2022 1414   CO2 25 10/15/2022 1414   GLUCOSE 100 (H) 10/15/2022 1414   BUN 22 (H) 10/15/2022 1414   CREATININE 2.06 (H) 10/15/2022 1414   CALCIUM 9.3 10/15/2022 1414   PROT 7.2 10/15/2022 1414   ALBUMIN 3.7 10/15/2022 1414   AST 73 (H) 10/15/2022 1414   ALT 41 10/15/2022 1414   ALKPHOS 139 (H) 10/15/2022 1414   BILITOT 0.7 10/15/2022 1414   GFRNONAA 40 (L) 10/15/2022 1414   GFRAA >60 02/23/2019 1406    No results found for: "SPEP", "UPEP"  Lab Results  Component Value Date  WBC 6.8 10/15/2022   NEUTROABS 4.0 10/15/2022   HGB 9.0 (L) 10/15/2022   HCT 28.6 (L) 10/15/2022   MCV 86.1 10/15/2022   PLT 255 10/15/2022      Chemistry      Component Value Date/Time   NA 137 10/15/2022 1414   K 4.4 10/15/2022 1414   CL 107 10/15/2022 1414   CO2 25 10/15/2022 1414   BUN 22 (H) 10/15/2022 1414   CREATININE 2.06 (H) 10/15/2022 1414      Component Value Date/Time   CALCIUM 9.3 10/15/2022 1414   ALKPHOS 139 (H) 10/15/2022 1414   AST 73 (H) 10/15/2022 1414   ALT 41 10/15/2022 1414   BILITOT 0.7 10/15/2022 1414       RADIOGRAPHIC STUDIES: I have personally reviewed the radiological images as listed and agreed with the findings in the report. CT ABDOMEN PELVIS W CONTRAST  Result Date: 10/12/2022 CLINICAL DATA:  History of right kidney removal flank pain EXAM: CT ABDOMEN AND PELVIS WITH CONTRAST TECHNIQUE: Multidetector CT imaging of  the abdomen and pelvis was performed using the standard protocol following bolus administration of intravenous contrast. RADIATION DOSE REDUCTION: This exam was performed according to the departmental dose-optimization program which includes automated exposure control, adjustment of the mA and/or kV according to patient size and/or use of iterative reconstruction technique. CONTRAST:  OMNIPAQUE IOHEXOL 300 MG/ML  SOLN COMPARISON:  CT 06/21/2022, 02/26/2022 FINDINGS: Lower chest: Lung bases demonstrate small right-sided pleural effusion. Bandlike densities at the right middle lobe and right lower lobe likely due to atelectasis. Interim finding of the new 4 mm left lung base pulmonary nodule, series 5, image 54 suspicious for a metastatic focus. Hepatobiliary: Liver is enlarged, measuring 25 cm craniocaudad. Overall heterogeneous appearance with multiple hypodense areas suspicious for metastatic disease. Suspect large metastatic mass involving the caudate lobe of liver with more focal hypodensity in the vicinity of the IVC, probably related to tumor thrombus, series 2, image 44. Triangular shaped gas and fluid collection either within the right hepatic lobe or abutting its margin given appearance on postoperative CT from June. This area measures roughly 5.4 by 4.5 cm on series 2, image 36. Gallstones. No biliary dilatation. Pancreas: No inflammation or ductal dilatation Spleen: Within normal limits Adrenals/Urinary Tract: History of right adrenalectomy. Left adrenal gland is normal. Left kidney shows no hydronephrosis. Multiple small left kidney stones. Urinary bladder is decompressed. Status post right nephrectomy. Poorly defined infiltrative soft tissue density in the right retroperitoneum, difficult to separate from the inferior margin of right hepatic lobe and suspected to represent residual or recurrent tumor. Extensive nodular infiltration extending inferiorly within the right retroperitoneum, contiguous  with poorly defined density at the right renal fossa. Stomach/Bowel: Stomach nonenlarged. No dilated small bowel. No acute bowel wall thickening Vascular/Lymphatic: Nonaneurysmal aorta. Suspected tumor thrombus in the intra hepatic IVC. Reproductive: Negative prostate Other: No free air. Numerous peritoneal nodules, for example anterior peritoneal nodule deep to midline scar measuring 3.5 x 3.5 cm on series 2 image 65 left upper quadrant stranding and multiple nodules, series 2, image 39 consistent with peritoneal metastatic disease. Multiple additional mesenteric nodules. Diffuse infiltrative nodular appearance of the right greater than left omentum with diffuse peritoneal nodularity on the right. Edema and stranding within the right greater than left flank. Small moderate volume of pelvic ascites. Musculoskeletal: No acute or suspicious osseous abnormality. Nodularity of the right rectus sheath, series 2 image 77 and 78. Suspicious for metastatic disease. IMPRESSION: 1. Status post right nephrectomy  and right adrenalectomy. Poorly defined soft tissue density in the expected location of right renal fossa with extensive ill-defined nodular soft tissue thickening in the right retroperitoneum, concerning for residual or recurrent tumor and metastatic disease. 2. Enlarged liver with multiple hypodense areas suspicious for metastatic disease. Suspect large metastatic mass involving the caudate lobe of liver with more focal hypodensity in the vicinity of the IVC, probably related to tumor thrombus. 3. Triangular shaped gas and fluid collection either within the right hepatic lobe or abutting its margin given appearance on postoperative CT from June. This area measures roughly 5.4 by 4.5 cm and is indeterminate for necrotic tumor or infected fluid collection. 4. Small volume abdominopelvic ascites, new compared to prior exam. 5. Extensive right-sided predominant peritoneal metastatic disease. Small to moderate volume of  pelvic ascites. 6. Interim finding of the new 4 mm left lung base pulmonary nodule, suspicious for a metastatic nodule. 7. Small right-sided pleural effusion with right middle lobe and right lower lobe atelectasis. 8. Gallstones. Nonobstructing left kidney stones. Electronically Signed   By: Jasmine Pang M.D.   On: 10/12/2022 23:20

## 2022-10-20 NOTE — Assessment & Plan Note (Signed)
Now s/p stopped ARB and improved fluid intake - for f/u lab

## 2022-10-20 NOTE — Assessment & Plan Note (Deleted)
Right-sided pain Currently controlled on pain medication.  Appreciate hospitalist management.

## 2022-10-20 NOTE — Assessment & Plan Note (Deleted)
Stage IV renal cell carcinoma, clear-cell type with peritoneal carcinomatosis, liver metastases 10/15/22 started Lenvatinib 20 mg once daily  Continue pembro every 3 weeks May complete MRI of brian. Will request genetic testing as outpatient Continue monitor TSH and fT4

## 2022-10-21 ENCOUNTER — Inpatient Hospital Stay: Payer: BC Managed Care – PPO

## 2022-10-21 ENCOUNTER — Ambulatory Visit (HOSPITAL_COMMUNITY): Payer: BC Managed Care – PPO

## 2022-10-21 ENCOUNTER — Telehealth: Payer: Self-pay

## 2022-10-21 DIAGNOSIS — G893 Neoplasm related pain (acute) (chronic): Secondary | ICD-10-CM

## 2022-10-21 DIAGNOSIS — C787 Secondary malignant neoplasm of liver and intrahepatic bile duct: Secondary | ICD-10-CM

## 2022-10-21 DIAGNOSIS — N179 Acute kidney failure, unspecified: Secondary | ICD-10-CM

## 2022-10-21 DIAGNOSIS — Z86711 Personal history of pulmonary embolism: Secondary | ICD-10-CM

## 2022-10-21 NOTE — Telephone Encounter (Signed)
TC from pt to cancel today's appt--states he needs to reschedule for next week. Per Dr. Cherly Hensen, asked pt if he is experiencing any side effects from the lenvatinib, and pt reports none. Pt states he is doing well. Appts rescheduled for pt.

## 2022-10-26 ENCOUNTER — Other Ambulatory Visit: Payer: Self-pay | Admitting: *Deleted

## 2022-10-26 MED ORDER — APIXABAN 5 MG PO TABS: 5.0000 mg | ORAL_TABLET | Freq: Two times a day (BID) | ORAL | 6 refills | Status: DC

## 2022-10-28 ENCOUNTER — Ambulatory Visit (HOSPITAL_COMMUNITY)
Admission: RE | Admit: 2022-10-28 | Discharge: 2022-10-28 | Disposition: A | Discharge status: Home / Self Care | Payer: BC Managed Care – PPO | Source: Ambulatory Visit

## 2022-10-28 DIAGNOSIS — C641 Malignant neoplasm of right kidney, except renal pelvis: Secondary | ICD-10-CM | POA: Diagnosis present

## 2022-10-28 MED ORDER — IOHEXOL 300 MG/ML  SOLN
60.0000 mL | Freq: Once | INTRAMUSCULAR | Status: AC | PRN
  Administered 2022-10-28: 60 mL via INTRAVENOUS

## 2022-10-28 MED ORDER — GADOBUTROL 1 MMOL/ML IV SOLN
10.0000 mL | Freq: Once | INTRAVENOUS | Status: AC | PRN
Start: 2022-10-28 — End: 2022-10-28
  Administered 2022-10-28: 10 mL via INTRAVENOUS

## 2022-11-02 ENCOUNTER — Other Ambulatory Visit: Payer: Self-pay

## 2022-11-02 DIAGNOSIS — C641 Malignant neoplasm of right kidney, except renal pelvis: Secondary | ICD-10-CM

## 2022-11-02 DIAGNOSIS — Z9189 Other specified personal risk factors, not elsewhere classified: Secondary | ICD-10-CM | POA: Insufficient documentation

## 2022-11-02 DIAGNOSIS — I2699 Other pulmonary embolism without acute cor pulmonale: Secondary | ICD-10-CM

## 2022-11-02 NOTE — Assessment & Plan Note (Signed)
10/15/22 started Lenvatinib 20 mg once daily Continue pembro every 3 weeks         10/28/22 MRI of brian. results pending Will request genetic testing Monitor for new symptoms Repeat CT in about 3 months, in  Jan 2025

## 2022-11-02 NOTE — Assessment & Plan Note (Signed)
-   Continue Eliquis 

## 2022-11-02 NOTE — Progress Notes (Unsigned)
Patient Care Team: Corwin Levins, MD as PCP - General  Clinic Day:  11/04/2022  Referring physician: Corwin Levins, MD  ASSESSMENT & PLAN:   Assessment & Plan: Marcus Burns is a 42 year old pleasant male with history of right renal cell carcinoma with metastatic disease.  Imaging showed peritoneal carcinomatosis, pulmonary nodules, liver metastases.  He was informed that treatment will be palliative.  We discussed adding TKI.  Given his extensive disease, recommend adding lenvatinib as soon as possible.  I discussed potential side effects with him.  We discussed importance of monitoring as outpatient once started new oral medication.  He understands.  Medication has been requested.  Diagnosis: mRCC Treatment:  10/15/2022 lenvatinib. 20 mg daily with pembrolizumab  Clear cell carcinoma of kidney (HCC) 10/15/22 started Lenvatinib 20 mg once daily Continue pembro every 3 weeks         10/28/22 MRI of brian. results negative for brain metastases Will request genetic testing Monitor for new symptoms Repeat CT in about 3 months, in  Jan 2025  History of pulmonary embolism Continue Eliquis  At risk for side effect of medication Lenvatinib has potential side effects of fatigue, hypertension, edema, hand-foot syndrome, alopecia, skin rash, hyponatremia, hypothyroidism, weight loss, decreased appetite, abdominal pain, diarrhea, nausea, vomiting, mouth sores, mouth pain, increased liver enzyme, headaches, insomnia, musculoskeletal pain, cough, bleeding, and kidney toxicity, rarely heart failure, arrhythmia causing sudden cardiac death, blood clot, decreasing blood counts, encephalopathy, osteonecrosis of the jaw.  It is important to avoid dehydration, drinking plenty of fluid daily.  Report any new side effects or symptoms.  Monitor BP at home daily and in clinic after 1 week, then every 2 weeks for 2 months, and at least monthly thereafter.  LFTs (at baseline, every 2 weeks for 2 months, and at least  monthly thereafter) BMP, fT4, TSH levels at baseline and monthly or as clinically indicated monitor for proteinuria at baseline and periodically during treatment (urine dipstick; if 2+ then obtain a 24-hour urine protein).  Baseline ECG (10/15/22 Qtc 443) and Echo. Repeat ECG next month ECG in select patients (congenital long QT syndrome, heart failure, bradyarrhythmias, or in those on concomitant medications known to prolong the QT interval).  Monitor for clinical signs/symptoms of cardiac dysfunction, arterial thrombosis, reversible posterior leukoencephalopathy syndrome (confirm with MRI), fistula formation, GI perforation, bleeding/hemorrhagic events, diarrhea, dehydration, and wound healing complications. Dental exam prior to and periodically during treatment.  Verify pregnancy status prior to treatment initiation (in patients who could become pregnant).   Hypertension Resume telmisartan 20 mg daily Continue metoprolol 50 mg daily. Monitor BP daily at rest.  Follow up every 3 weeks for pretreatment evaluation. Visit with labs on 11/21 and pembro infusion 11/22  The patient understands the plans discussed today and is in agreement with them.  He knows to contact our office if he develops concerns prior to his next appointment.   [name], MD  [facility] [address] [place] [address] Dept: [phone] Dept Fax: [phone]   Orders Placed This Encounter  Procedures   EKG 12-Lead   ECHOCARDIOGRAM LIMITED    Standing Status:   Future    Standing Expiration Date:   11/02/2023    Order Specific Question:   Where should this test be performed    Answer:   [facility]    Order Specific Question:   Perflutren DEFINITY (image enhancing agent) should be administered unless hypersensitivity or  allergy exist    Answer:   Administer Perflutren    Order Specific Question:   Reason for exam-Echo     Answer:   Chemo  [id number]      CHIEF COMPLAINT:  CC: RCC  Current Treatment:  len/pembro  INTERVAL HISTORY:  Marcus Burns is here today for repeat clinical assessment. He denies fevers or chills. He denies pain. His appetite is good.  No headache, chest pain, short of breath. No stomach pain, nausea, vomiting. A few loose stool. No change in urinating pattern. No bloody urine or stool.  He has some hoarseness.  No trouble swallowing. No mouth sore. Some appetite had decreased and improved.  He works as Community education officer.   Sometimes on the right side where the incision he get pain but not all the time.  He had increased metoprolol to 50 mg for about 2 weeks.  I have reviewed the past medical history, past surgical history, social history and family history with the patient and they are unchanged from previous note.  ALLERGIES:  is allergic to amlodipine and doxycycline.  MEDICATIONS:  Current Outpatient Medications  Medication Sig Dispense Refill   acetaminophen (TYLENOL) 500 MG tablet Take 1 tablet (500 mg total) by mouth every 6 (six) hours as needed for moderate pain (pain.).     albuterol (VENTOLIN HFA) 108 (90 Base) MCG/ACT inhaler Inhale 2 puffs into the lungs every 6 (six) hours as needed for wheezing or shortness of breath. 8 g 1   apixaban (ELIQUIS) 5 MG TABS tablet Take 1 tablet (5 mg total) by mouth 2 (two) times daily. 60 tablet 6   cetirizine (ZYRTEC) 10 MG tablet Take 10 mg by mouth daily.     clonazePAM (KLONOPIN) 0.5 MG tablet Take 1 tablet (0.5 mg total) by mouth 2 (two) times daily as needed for anxiety. (Patient taking differently: Take 0.5 mg by mouth at bedtime.) 60 tablet 1   docusate sodium (COLACE) 100 MG capsule Take 1 capsule (100 mg total) by mouth 2 (two) times daily. (Patient taking differently: Take 100 mg by mouth as needed for mild constipation or moderate constipation.)     iron polysaccharides (NU-IRON) 150 MG capsule Take 1 capsule (150 mg total) by mouth daily. (Patient  taking differently: Take 150 mg by mouth every other day.) 90 capsule 1   lenvatinib 20 mg daily dose (LENVIMA) 2 x 10 MG capsule Take 2 capsules (20 mg total) by mouth daily. 60 capsule 11   metoprolol succinate (TOPROL-XL) 50 MG 24 hr tablet Take 1 tablet (50 mg total) by mouth daily. Take with or immediately following a meal. 90 tablet 3   ondansetron (ZOFRAN) 8 MG tablet Take 1 tablet (8 mg total) by mouth every 8 (eight) hours as needed for nausea or vomiting. 30 tablet 1   prochlorperazine (COMPAZINE) 10 MG tablet Take 1 tablet (10 mg total) by mouth every 6 (six) hours as needed for nausea or vomiting. 30 tablet 1   oxyCODONE (OXY IR/ROXICODONE) 5 MG immediate release tablet Take 1 tablet (5 mg total) by mouth every 6 (six) hours as needed for severe pain (pain score 7-10). 30 tablet 0   No current facility-administered medications for this visit.    HISTORY OF PRESENT ILLNESS:   Oncology History  Clear cell carcinoma of kidney (HCC)  03/11/2022 Imaging   CTA Extensive right-sided pulmonary emboli with evidence of right heart strain.   Pleural based somewhat rounded areas of increased airspace opacity in the right lower lobe  likely representing early changes of pulmonary infarct.   Changes consistent with the known history of right renal cell carcinoma.   05/10/2022 Imaging   CTA 1. Significantly decreased though not entirely resolved clot burden in the right lung with persistent small volume mural adherent clot. 2. Linear opacities in the right lower lobe may reflect atelectasis or developing scar. 3. Right upper pole renal mass again seen, incompletely imaged. 4. Cholelithiasis.   06/18/2022 Pathology Results   A. KIDNEY, RIGHT AND PERICAVAL LYMPH NODES, RADICAL NEPHRECTOMY:       Clear cell renal cell carcinoma, WHO / ISUP grade 3 (of 4).       Tumor size: 14.5 x 12 x 8.5 cm.       Tumor necrosis identified accounting for approximately 15% of  parenchyma.      Carcinoma  disrupts capsule and invades into perinephric tissue,  adrenal parenchyma, renal vein and renal sinus.       Renal vein margin is positive for carcinoma.  Ureteral and vascular margins are negative for carcinoma.  Separated tumor satellite focus (0.5 cm).  Background renal parenchyma with focal interstitial inflammation without  glomerulosclerosis, tubal atrophy or vasculopathy.  See oncology table.   Procedure: Nephrectomy  Specimen Laterality: Right  Tumor Size: 14.5 x 12 x 8.5 cm  Tumor Focality: One focus of mail tumor (14.5 cm) with one fucus of  tumor satellite nodule (0.5 cm)  Histologic Type: Clear cell renal cell carcinoma  Sarcomatoid Features: Not identified  Rhabdoid Features: Not identified  Histologic Grade: Grade 3  Tumor Necrosis: present, 15% of the parenchymal volume  Tumor Extension: and invades into perinephric tissue, adrenal  parenchyma, renal vein and renal sinus.  Lymphatic and/or Vascular Invasion:  Not identified  Margins: Renal vein margin is positive for tumor  Regional Lymph Nodes:       Number of Lymph Nodes with Tumor: 0            Number of Lymph Nodes Examined: 1  Distant Metastasis:       Distant Site(s) Involved: Not applicable  Additional Findings in Nonneoplastic Kidney: Focal interstitial  inflammation without glomerulosclerosis, tubal atrophy or vasculopathy  Pathologic Stage Classification (pTNM, AJCC 8th Edition): pT4, pN0    07/16/2022 Initial Diagnosis   Clear cell carcinoma of kidney (HCC)   08/11/2022 -  Chemotherapy   Received first dose of pembrolizumab Patient is on Treatment Plan : RENAL Pembrolizumab (200) q21d      10/12/2022 Imaging   Presented with right sided flank pain.  CT AP 1. Status post right nephrectomy and right adrenalectomy. Poorly defined soft tissue density in the expected location of right renal fossa with extensive ill-defined nodular soft tissue thickening in the right retroperitoneum, concerning for  residual or recurrent tumor and metastatic disease. 2. Enlarged liver with multiple hypodense areas suspicious for metastatic disease. Suspect large metastatic mass involving the caudate lobe of liver with more focal hypodensity in the vicinity of the IVC, probably related to tumor thrombus. 3. Triangular shaped gas and fluid collection either within the right hepatic lobe or abutting its margin given appearance on postoperative CT from June. This area measures roughly 5.4 by 4.5 cm and is indeterminate for necrotic tumor or infected fluid collection. 4. Small volume abdominopelvic ascites, new compared to prior exam. 5. Extensive right-sided predominant peritoneal metastatic disease. Small to moderate volume of pelvic ascites. 6. Interim finding of the new 4 mm left lung base pulmonary nodule, suspicious for a metastatic nodule.  7. Small right-sided pleural effusion with right middle lobe and right lower lobe atelectasis. 8. Gallstones. Nonobstructing left kidney stones.   10/14/2022 -  Hospital Admission   Seen while inpatient findings of metastatic disease.    10/15/2022 -  Chemotherapy   Patient is on Treatment Plan : Lenvatinib 20 mg daily RENAL Pembrolizumab (200) q21d         REVIEW OF SYSTEMS:   All relevant systems were reviewed with the patient and are negative.   VITALS:  Blood pressure (!) 136/105, pulse 97, temperature (!) 97.3 F (36.3 C), temperature source Oral, resp. rate 15, weight 293 lb 4.8 oz (133 kg), SpO2 99%.  Wt Readings from Last 3 Encounters:  11/04/22 293 lb 4.8 oz (133 kg)  10/18/22 (!) 321 lb 9.6 oz (145.9 kg)  10/15/22 (!) 325 lb 12.8 oz (147.8 kg)    Body mass index is 35.7 kg/m.  Performance status (ECOG): 1 - Symptomatic but completely ambulatory  PHYSICAL EXAM:   GENERAL:alert, no distress and comfortable SKIN: skin color normal, no rashes  EYES: normal, sclera clear OROPHARYNX: no exudate, no erythema    NECK: supple,   non-tender, without nodularity LYMPH:  no palpable cervical lymphadenopathy LUNGS: clear to auscultation with normal breathing effort.  No wheeze or rales HEART: regular rate & rhythm and no murmurs and no lower extremity edema ABDOMEN: abdomen soft, non-tender and mildly distended Musculoskeletal: no edema NEURO: alert  LABORATORY DATA:  I have reviewed the data as listed    Component Value Date/Time   NA 139 11/04/2022 0831   K 4.6 11/04/2022 0831   CL 105 11/04/2022 0831   CO2 29 11/04/2022 0831   GLUCOSE 102 (H) 11/04/2022 0831   BUN 15 11/04/2022 0831   CREATININE 1.51 (H) 11/04/2022 0831   CALCIUM 9.6 11/04/2022 0831   PROT 8.1 11/04/2022 0831   ALBUMIN 3.9 11/04/2022 0831   AST 31 11/04/2022 0831   ALT 11 11/04/2022 0831   ALKPHOS 94 11/04/2022 0831   BILITOT 0.4 11/04/2022 0831   GFRNONAA 58 (L) 11/04/2022 0831   GFRAA >60 02/23/2019 1406    No results found for: "SPEP", "UPEP"  Lab Results  Component Value Date   WBC 5.6 11/04/2022   NEUTROABS 3.2 11/04/2022   HGB 10.6 (L) 11/04/2022   HCT 34.0 (L) 11/04/2022   MCV 84.6 11/04/2022   PLT 473 (H) 11/04/2022      Chemistry      Component Value Date/Time   NA 139 11/04/2022 0831   K 4.6 11/04/2022 0831   CL 105 11/04/2022 0831   CO2 29 11/04/2022 0831   BUN 15 11/04/2022 0831   CREATININE 1.51 (H) 11/04/2022 0831      Component Value Date/Time   CALCIUM 9.6 11/04/2022 0831   ALKPHOS 94 11/04/2022 0831   AST 31 11/04/2022 0831   ALT 11 11/04/2022 0831   BILITOT 0.4 11/04/2022 0831       RADIOGRAPHIC STUDIES: I have personally reviewed the radiological images as listed and agreed with the findings in the report. CT Chest W Contrast  Result Date: 11/03/2022 CLINICAL DATA:  Staging renal cell carcinoma. Lung lesions. Previous right nephrectomy. Weight loss. * Tracking Code: BO *. EXAM: CT CHEST WITH CONTRAST TECHNIQUE: Multidetector CT imaging of the chest was performed during intravenous contrast  administration. RADIATION DOSE REDUCTION: This exam was performed according to the departmental dose-optimization program which includes automated exposure control, adjustment of the mA and/or kV according to patient size and/or  use of iterative reconstruction technique. CONTRAST:  60mL OMNIPAQUE IOHEXOL 300 MG/ML  SOLN COMPARISON:  CT 06/21/2022 and older. Recent abdomen pelvis CT 10/12/2022. FINDINGS: Cardiovascular: Heart is nonenlarged. Trace pericardial fluid. The thoracic aorta has a normal course and caliber. Relatively poor enhancement of the pulmonary arterial tree. Mediastinum/Nodes: Preserved thyroid gland. The esophagus has a normal course and caliber. There are small lymph nodes identified along the thorax including left supraclavicular, mediastinum, hilar and axillary regions which are not pathologic by size criteria. These are also unchanged from the prior chest CT of June 2024. There are some increasing small nodes in the pre cardiophrenic space. Example on series 2, image 133 measures 7 by 12 mm. Lungs/Pleura: There is a very small right pleural effusion. Slight pleural thickening. There is some bandlike changes in the right lung base, likely atelectasis and scar. No left-sided effusion. No pneumothorax. No left-sided consolidation. There is tiny left lower lobe lung nodule measuring 3 mm on series 6 image 126. This was a 1 mm focus in May 2024. This area was obscured by lung opacity in the more recent chest CT. There is juxtapleural nodule medial left lung base on series 6, image 120 of similar size which also would be new. Additional small juxtapleural nodules along the course of the left lung interlobar fissure are stable on series 6, image 82. Upper Abdomen: Please correlate with the recent abdomen and pelvis CT describing peritoneal carcinomatosis, liver lesions and potential tumor in vein along the course of the IVC with numerous collaterals. Musculoskeletal: Mild degenerative changes along  the spine. IMPRESSION: Few punctate nodules in the left lung base are new going back to May 2024. These are in principle are nonspecific but in light of the other findings in the abdomen on recent examination some early lung metastases are in the differential recommend attention on short follow-up. Tiny right pleural effusion with some adjacent opacity. Atelectasis and scarring is favored. Small mediastinal nodes including pre cardiophrenic space. Please correlate with the patient's recent abdomen and pelvis CT for details of extensive disease. Aortic Atherosclerosis (ICD10-I70.0). Electronically Signed   By: Karen Kays M.D.   On: 11/03/2022 15:32   MR Brain W Wo Contrast  Result Date: 11/03/2022 CLINICAL DATA:  Renal cell carcinoma, staging EXAM: MRI HEAD WITHOUT AND WITH CONTRAST TECHNIQUE: Multiplanar, multiecho pulse sequences of the brain and surrounding structures were obtained without and with intravenous contrast. CONTRAST:  10mL GADAVIST GADOBUTROL 1 MMOL/ML IV SOLN COMPARISON:  None Available. FINDINGS: Brain: No restricted diffusion to suggest acute or subacute infarct. No abnormal parenchymal or meningeal enhancement. No acute hemorrhage, mass, mass effect, or midline shift. No hydrocephalus or extra-axial collection. Pituitary and craniocervical junction within normal limits. No hemosiderin deposition to suggest remote hemorrhage. Normal cerebral volume for age. A small focus of T2 hyperintense signal in the left cerebellum is favored to represent reverberation artifact from the patient's venous sinuses and is without a correlate on the T2 weighted sequence. Vascular: Normal arterial flow voids. Normal arterial and venous enhancement. Skull and upper cervical spine: Normal marrow signal. Sinuses/Orbits: Mucosal thickening throughout the paranasal sinuses, with air-fluid level in right maxillary sinus. No acute finding in the orbits. Other: The mastoid air cells are well aerated. IMPRESSION: 1. No  acute intracranial process. No evidence of intracranial metastatic disease. 2. Mucosal thickening throughout the paranasal sinuses, with air-fluid level in the right maxillary sinus. Correlate for acute sinusitis. Electronically Signed   By: Wiliam Ke M.D.   On: 11/03/2022  15:18   CT ABDOMEN PELVIS W CONTRAST  Result Date: 10/12/2022 CLINICAL DATA:  History of right kidney removal flank pain EXAM: CT ABDOMEN AND PELVIS WITH CONTRAST TECHNIQUE: Multidetector CT imaging of the abdomen and pelvis was performed using the standard protocol following bolus administration of intravenous contrast. RADIATION DOSE REDUCTION: This exam was performed according to the departmental dose-optimization program which includes automated exposure control, adjustment of the mA and/or kV according to patient size and/or use of iterative reconstruction technique. CONTRAST:  OMNIPAQUE IOHEXOL 300 MG/ML  SOLN COMPARISON:  CT 06/21/2022, 02/26/2022 FINDINGS: Lower chest: Lung bases demonstrate small right-sided pleural effusion. Bandlike densities at the right middle lobe and right lower lobe likely due to atelectasis. Interim finding of the new 4 mm left lung base pulmonary nodule, series 5, image 54 suspicious for a metastatic focus. Hepatobiliary: Liver is enlarged, measuring 25 cm craniocaudad. Overall heterogeneous appearance with multiple hypodense areas suspicious for metastatic disease. Suspect large metastatic mass involving the caudate lobe of liver with more focal hypodensity in the vicinity of the IVC, probably related to tumor thrombus, series 2, image 44. Triangular shaped gas and fluid collection either within the right hepatic lobe or abutting its margin given appearance on postoperative CT from June. This area measures roughly 5.4 by 4.5 cm on series 2, image 36. Gallstones. No biliary dilatation. Pancreas: No inflammation or ductal dilatation Spleen: Within normal limits Adrenals/Urinary Tract: History of  right adrenalectomy. Left adrenal gland is normal. Left kidney shows no hydronephrosis. Multiple small left kidney stones. Urinary bladder is decompressed. Status post right nephrectomy. Poorly defined infiltrative soft tissue density in the right retroperitoneum, difficult to separate from the inferior margin of right hepatic lobe and suspected to represent residual or recurrent tumor. Extensive nodular infiltration extending inferiorly within the right retroperitoneum, contiguous with poorly defined density at the right renal fossa. Stomach/Bowel: Stomach nonenlarged. No dilated small bowel. No acute bowel wall thickening Vascular/Lymphatic: Nonaneurysmal aorta. Suspected tumor thrombus in the intra hepatic IVC. Reproductive: Negative prostate Other: No free air. Numerous peritoneal nodules, for example anterior peritoneal nodule deep to midline scar measuring 3.5 x 3.5 cm on series 2 image 65 left upper quadrant stranding and multiple nodules, series 2, image 39 consistent with peritoneal metastatic disease. Multiple additional mesenteric nodules. Diffuse infiltrative nodular appearance of the right greater than left omentum with diffuse peritoneal nodularity on the right. Edema and stranding within the right greater than left flank. Small moderate volume of pelvic ascites. Musculoskeletal: No acute or suspicious osseous abnormality. Nodularity of the right rectus sheath, series 2 image 77 and 78. Suspicious for metastatic disease. IMPRESSION: 1. Status post right nephrectomy and right adrenalectomy. Poorly defined soft tissue density in the expected location of right renal fossa with extensive ill-defined nodular soft tissue thickening in the right retroperitoneum, concerning for residual or recurrent tumor and metastatic disease. 2. Enlarged liver with multiple hypodense areas suspicious for metastatic disease. Suspect large metastatic mass involving the caudate lobe of liver with more focal hypodensity in the  vicinity of the IVC, probably related to tumor thrombus. 3. Triangular shaped gas and fluid collection either within the right hepatic lobe or abutting its margin given appearance on postoperative CT from June. This area measures roughly 5.4 by 4.5 cm and is indeterminate for necrotic tumor or infected fluid collection. 4. Small volume abdominopelvic ascites, new compared to prior exam. 5. Extensive right-sided predominant peritoneal metastatic disease. Small to moderate volume of pelvic ascites. 6. Interim finding of the new  4 mm left lung base pulmonary nodule, suspicious for a metastatic nodule. 7. Small right-sided pleural effusion with right middle lobe and right lower lobe atelectasis. 8. Gallstones. Nonobstructing left kidney stones. Electronically Signed   By: Jasmine Pang M.D.   On: 10/12/2022 23:20

## 2022-11-02 NOTE — Assessment & Plan Note (Addendum)
Lenvatinib has potential side effects of fatigue, hypertension, edema, hand-foot syndrome, alopecia, skin rash, hyponatremia, hypothyroidism, weight loss, decreased appetite, abdominal pain, diarrhea, nausea, vomiting, mouth sores, mouth pain, increased liver enzyme, headaches, insomnia, musculoskeletal pain, cough, bleeding, and kidney toxicity, rarely heart failure, arrhythmia causing sudden cardiac death, blood clot, decreasing blood counts, encephalopathy, osteonecrosis of the jaw.  It is important to avoid dehydration, drinking plenty of fluid daily.  Report any new side effects or symptoms.  Monitor BP at home daily and in clinic after 1 week, then every 2 weeks for 2 months, and at least monthly thereafter.  LFTs (at baseline, every 2 weeks for 2 months, and at least monthly thereafter) BMP, fT4, TSH levels at baseline and monthly or as clinically indicated monitor for proteinuria at baseline and periodically during treatment (urine dipstick; if 2+ then obtain a 24-hour urine protein).  Baseline ECG (10/15/22 Qtc 443) and Echo. Repeat ECG next month ECG in select patients (congenital long QT syndrome, heart failure, bradyarrhythmias, or in those on concomitant medications known to prolong the QT interval).  Monitor for clinical signs/symptoms of cardiac dysfunction, arterial thrombosis, reversible posterior leukoencephalopathy syndrome (confirm with MRI), fistula formation, GI perforation, bleeding/hemorrhagic events, diarrhea, dehydration, and wound healing complications. Dental exam prior to and periodically during treatment.  Verify pregnancy status prior to treatment initiation (in patients who could become pregnant).

## 2022-11-03 ENCOUNTER — Other Ambulatory Visit: Payer: BC Managed Care – PPO

## 2022-11-03 ENCOUNTER — Ambulatory Visit: Payer: BC Managed Care – PPO

## 2022-11-03 ENCOUNTER — Ambulatory Visit: Payer: BC Managed Care – PPO | Admitting: Hematology and Oncology

## 2022-11-04 ENCOUNTER — Other Ambulatory Visit: Payer: Self-pay

## 2022-11-04 ENCOUNTER — Inpatient Hospital Stay (HOSPITAL_BASED_OUTPATIENT_CLINIC_OR_DEPARTMENT_OTHER): Payer: BC Managed Care – PPO

## 2022-11-04 ENCOUNTER — Inpatient Hospital Stay: Payer: BC Managed Care – PPO

## 2022-11-04 VITALS — BP 136/105 | HR 97 | Temp 97.3°F | Resp 15 | Wt 293.3 lb

## 2022-11-04 DIAGNOSIS — I159 Secondary hypertension, unspecified: Secondary | ICD-10-CM | POA: Diagnosis not present

## 2022-11-04 DIAGNOSIS — D649 Anemia, unspecified: Secondary | ICD-10-CM

## 2022-11-04 DIAGNOSIS — Z86711 Personal history of pulmonary embolism: Secondary | ICD-10-CM

## 2022-11-04 DIAGNOSIS — Z9189 Other specified personal risk factors, not elsewhere classified: Secondary | ICD-10-CM

## 2022-11-04 DIAGNOSIS — C641 Malignant neoplasm of right kidney, except renal pelvis: Secondary | ICD-10-CM

## 2022-11-04 DIAGNOSIS — Z5112 Encounter for antineoplastic immunotherapy: Secondary | ICD-10-CM | POA: Diagnosis not present

## 2022-11-04 LAB — VITAMIN B12: Vitamin B-12: 509 pg/mL (ref 180–914)

## 2022-11-04 LAB — CBC WITH DIFFERENTIAL (CANCER CENTER ONLY)
Abs Immature Granulocytes: 0.01 10*3/uL (ref 0.00–0.07)
Basophils Absolute: 0 10*3/uL (ref 0.0–0.1)
Basophils Relative: 1 %
Eosinophils Absolute: 0.6 10*3/uL — ABNORMAL HIGH (ref 0.0–0.5)
Eosinophils Relative: 10 %
HCT: 34 % — ABNORMAL LOW (ref 39.0–52.0)
Hemoglobin: 10.6 g/dL — ABNORMAL LOW (ref 13.0–17.0)
Immature Granulocytes: 0 %
Lymphocytes Relative: 26 %
Lymphs Abs: 1.5 10*3/uL (ref 0.7–4.0)
MCH: 26.4 pg (ref 26.0–34.0)
MCHC: 31.2 g/dL (ref 30.0–36.0)
MCV: 84.6 fL (ref 80.0–100.0)
Monocytes Absolute: 0.3 10*3/uL (ref 0.1–1.0)
Monocytes Relative: 6 %
Neutro Abs: 3.2 10*3/uL (ref 1.7–7.7)
Neutrophils Relative %: 57 %
Platelet Count: 473 10*3/uL — ABNORMAL HIGH (ref 150–400)
RBC: 4.02 MIL/uL — ABNORMAL LOW (ref 4.22–5.81)
RDW: 15.9 % — ABNORMAL HIGH (ref 11.5–15.5)
WBC Count: 5.6 10*3/uL (ref 4.0–10.5)
nRBC: 0 % (ref 0.0–0.2)

## 2022-11-04 LAB — CMP (CANCER CENTER ONLY)
ALT: 11 U/L (ref 0–44)
AST: 31 U/L (ref 15–41)
Albumin: 3.9 g/dL (ref 3.5–5.0)
Alkaline Phosphatase: 94 U/L (ref 38–126)
Anion gap: 5 (ref 5–15)
BUN: 15 mg/dL (ref 6–20)
CO2: 29 mmol/L (ref 22–32)
Calcium: 9.6 mg/dL (ref 8.9–10.3)
Chloride: 105 mmol/L (ref 98–111)
Creatinine: 1.51 mg/dL — ABNORMAL HIGH (ref 0.61–1.24)
GFR, Estimated: 58 mL/min — ABNORMAL LOW (ref >60)
Glucose, Bld: 102 mg/dL — ABNORMAL HIGH (ref 70–99)
Potassium: 4.6 mmol/L (ref 3.5–5.1)
Sodium: 139 mmol/L (ref 135–145)
Total Bilirubin: 0.4 mg/dL (ref 0.3–1.2)
Total Protein: 8.1 g/dL (ref 6.5–8.1)

## 2022-11-04 LAB — FERRITIN: Ferritin: 106 ng/mL (ref 24–336)

## 2022-11-04 LAB — T4, FREE: Free T4: 1.19 ng/dL — ABNORMAL HIGH (ref 0.61–1.12)

## 2022-11-04 LAB — LACTATE DEHYDROGENASE: LDH: 437 U/L — ABNORMAL HIGH (ref 98–192)

## 2022-11-04 LAB — FOLATE: Folate: 6.8 ng/mL (ref >5.9)

## 2022-11-04 LAB — TSH: TSH: 5.405 u[IU]/mL — ABNORMAL HIGH (ref 0.350–4.500)

## 2022-11-04 MED ORDER — OXYCODONE HCL 5 MG PO TABS: 5.0000 mg | ORAL_TABLET | Freq: Four times a day (QID) | ORAL | 0 refills | Status: DC | PRN

## 2022-11-04 NOTE — Assessment & Plan Note (Addendum)
Resume telmisartan 20 mg daily Continue metoprolol 50 mg daily. Monitor BP daily at rest.

## 2022-11-05 ENCOUNTER — Inpatient Hospital Stay: Payer: BC Managed Care – PPO | Attending: Hematology and Oncology

## 2022-11-05 VITALS — BP 134/88 | HR 88 | Temp 98.2°F | Resp 18

## 2022-11-05 DIAGNOSIS — Z86711 Personal history of pulmonary embolism: Secondary | ICD-10-CM | POA: Insufficient documentation

## 2022-11-05 DIAGNOSIS — I10 Essential (primary) hypertension: Secondary | ICD-10-CM | POA: Insufficient documentation

## 2022-11-05 DIAGNOSIS — C641 Malignant neoplasm of right kidney, except renal pelvis: Secondary | ICD-10-CM | POA: Insufficient documentation

## 2022-11-05 DIAGNOSIS — Z9189 Other specified personal risk factors, not elsewhere classified: Secondary | ICD-10-CM | POA: Diagnosis not present

## 2022-11-05 DIAGNOSIS — Z5112 Encounter for antineoplastic immunotherapy: Secondary | ICD-10-CM | POA: Diagnosis present

## 2022-11-05 DIAGNOSIS — C786 Secondary malignant neoplasm of retroperitoneum and peritoneum: Secondary | ICD-10-CM | POA: Insufficient documentation

## 2022-11-05 DIAGNOSIS — Z79620 Long term (current) use of immunosuppressive biologic: Secondary | ICD-10-CM | POA: Insufficient documentation

## 2022-11-05 DIAGNOSIS — I2699 Other pulmonary embolism without acute cor pulmonale: Secondary | ICD-10-CM

## 2022-11-05 DIAGNOSIS — C787 Secondary malignant neoplasm of liver and intrahepatic bile duct: Secondary | ICD-10-CM | POA: Diagnosis not present

## 2022-11-05 MED ORDER — SODIUM CHLORIDE 0.9 % IV SOLN
200.0000 mg | Freq: Once | INTRAVENOUS | Status: AC
  Administered 2022-11-05: 200 mg via INTRAVENOUS
  Filled 2022-11-05: qty 200

## 2022-11-05 MED ORDER — SODIUM CHLORIDE 0.9 % IV SOLN: Freq: Once | INTRAVENOUS | Status: AC

## 2022-11-09 ENCOUNTER — Ambulatory Visit: Payer: BC Managed Care – PPO

## 2022-11-09 ENCOUNTER — Other Ambulatory Visit: Payer: BC Managed Care – PPO

## 2022-11-17 ENCOUNTER — Ambulatory Visit (HOSPITAL_COMMUNITY): Payer: BC Managed Care – PPO

## 2022-11-26 ENCOUNTER — Other Ambulatory Visit: Payer: Self-pay

## 2022-11-27 ENCOUNTER — Other Ambulatory Visit: Payer: Self-pay

## 2022-11-29 ENCOUNTER — Other Ambulatory Visit: Payer: Self-pay

## 2022-11-29 DIAGNOSIS — C787 Secondary malignant neoplasm of liver and intrahepatic bile duct: Secondary | ICD-10-CM

## 2022-11-29 NOTE — Assessment & Plan Note (Signed)
10/15/22 started Lenvatinib 20 mg once daily Continue pembro every 3 weeks         10/28/22 MRI of brian. results negative for brain metastases Referral made for genetic testing Monitor for new symptoms Repeat CT in about 3 months, in  Jan 2025

## 2022-11-29 NOTE — Assessment & Plan Note (Signed)
Continue apixaban 5 mg twice daily

## 2022-11-29 NOTE — Progress Notes (Unsigned)
Patient Care Team: Marcus Levins, MD as PCP - General  Clinic Day:  11/30/2022  Referring physician: Corwin Levins, MD  ASSESSMENT & PLAN:   Assessment & Plan: Marcus Burns is a 42 year old pleasant male with history of right renal cell carcinoma with metastatic disease here for follow up.  Imaging showed peritoneal carcinomatosis, pulmonary nodules, liver metastases.   Overall tolerating well. Clinically with response. Hypertension from TKI uncontrolled. Will increase dose of telmisartan today. ECG done in the room today to review QTc. Will need to monitor thyroid function.   Diagnosis: mRCC Treatment:  10/15/2022 lenvatinib. 20 mg daily with pembrolizumab  Clear cell carcinoma of kidney (HCC) 10/15/22 started Lenvatinib 20 mg once daily Continue pembro every 3 weeks         10/28/22 MRI of brian. results negative for brain metastases Referral made for genetic testing Monitor for new symptoms Repeat CT in about 3 months, in  Jan 2025  History of pulmonary embolism Continue apixaban 5 mg twice daily  At risk for side effect of medication Lenvatinib has potential side effects of fatigue, hypertension, edema, hand-foot syndrome, alopecia, skin rash, hyponatremia, hypothyroidism, weight loss, decreased appetite, abdominal pain, diarrhea, nausea, vomiting, mouth sores, mouth pain, increased liver enzyme, headaches, insomnia, musculoskeletal pain, cough, bleeding, and kidney toxicity, rarely heart failure, arrhythmia causing sudden cardiac death, blood clot, decreasing blood counts, encephalopathy, osteonecrosis of the jaw.  It is important to avoid dehydration, drinking plenty of fluid daily.  Report any new side effects or symptoms.   Monitor BP at home daily and in clinic after 1 week, then every 2 weeks for 2 months, and at least monthly thereafter.  LFTs (at baseline, every 2 weeks for 2 months, and at least monthly thereafter) BMP, fT4, TSH levels at baseline and monthly or as clinically  indicated monitor for proteinuria at baseline and periodically during treatment (urine dipstick; if 2+ then obtain a 24-hour urine protein).  Baseline ECG (10/15/22 Qtc 443) and Echo. Repeat ECG next month ECG in select patients (congenital long QT syndrome, heart failure, bradyarrhythmias, or in those on concomitant medications known to prolong the QT interval).  Monitor for clinical signs/symptoms of cardiac dysfunction, arterial thrombosis, reversible posterior leukoencephalopathy syndrome (confirm with MRI), fistula formation, GI perforation, bleeding/hemorrhagic events, diarrhea, dehydration, and wound healing complications. Dental exam periodically during treatment.   Hypertension Increase telmisartan to 40 mg daily Continue metoprolol 50 mg daily. Monitor BP daily at rest.    The patient understands the plans discussed today and is in agreement with them.  He knows to contact our office if he develops concerns prior to his next appointment.  Marcus Sartorius, MD  [facility] - A DEPT OF [facility] [address] [place] [address] Dept: [phone] Dept Fax: [phone]   Orders Placed This Encounter  Procedures   EKG 12-Lead    Ordered by Cherly Hensen    EKG 12-Lead    Ordered by Cherly Hensen       CHIEF COMPLAINT:  CC: mRCC  Current Treatment:  len/pembro  INTERVAL HISTORY:  Marcus Burns is here today for repeat clinical assessment.   BP at home 130-150's SBP at home. Marland Kitchen  GI: diarrhea GU: hematuria, difficulty urinating. Drinking a lot. CV: chest pain, palpitation, short of breath, leg swelling.  ECG in Oct. Not repeated yet for evaluation of QT interval. He has rescheduled ECG bleeding/hemorrhagic events, no bleeding from eliquis. Stool color normal No wound  issue Dental exam to be scheduled after Thanksgiving. No headache visual change.  Fatigue for 2 days after infusion and resolved. Pain over abdomen most  resolved.  He calls for delivery of lenvatinib.  I have reviewed the past medical history, past surgical history, social history and family history with the patient and they are unchanged from previous note.  ALLERGIES:  is allergic to amlodipine and doxycycline.  MEDICATIONS:  Current Outpatient Medications  Medication Sig Dispense Refill   acetaminophen (TYLENOL) 500 MG tablet Take 1 tablet (500 mg total) by mouth every 6 (six) hours as needed for moderate pain (pain.).     albuterol (VENTOLIN HFA) 108 (90 Base) MCG/ACT inhaler Inhale 2 puffs into the lungs every 6 (six) hours as needed for wheezing or shortness of breath. 8 g 1   apixaban (ELIQUIS) 5 MG TABS tablet Take 1 tablet (5 mg total) by mouth 2 (two) times daily. 60 tablet 6   cetirizine (ZYRTEC) 10 MG tablet Take 10 mg by mouth daily.     clonazePAM (KLONOPIN) 0.5 MG tablet Take 1 tablet (0.5 mg total) by mouth 2 (two) times daily as needed for anxiety. (Patient taking differently: Take 0.5 mg by mouth at bedtime.) 60 tablet 1   docusate sodium (COLACE) 100 MG capsule Take 1 capsule (100 mg total) by mouth 2 (two) times daily. (Patient taking differently: Take 100 mg by mouth as needed for mild constipation or moderate constipation.)     iron polysaccharides (NU-IRON) 150 MG capsule Take 1 capsule (150 mg total) by mouth daily. (Patient taking differently: Take 150 mg by mouth every other day.) 90 capsule 1   lenvatinib 20 mg daily dose (LENVIMA) 2 x 10 MG capsule Take 2 capsules (20 mg total) by mouth daily. 60 capsule 11   metoprolol succinate (TOPROL-XL) 50 MG 24 hr tablet Take 1 tablet (50 mg total) by mouth daily. Take with or immediately following a meal. 90 tablet 3   ondansetron (ZOFRAN) 8 MG tablet Take 1 tablet (8 mg total) by mouth every 8 (eight) hours as needed for nausea or vomiting. 30 tablet 1   oxyCODONE (OXY IR/ROXICODONE) 5 MG immediate release tablet Take 1 tablet (5 mg total) by mouth every 6 (six) hours as  needed for severe pain (pain score 7-10). 30 tablet 0   prochlorperazine (COMPAZINE) 10 MG tablet Take 1 tablet (10 mg total) by mouth every 6 (six) hours as needed for nausea or vomiting. 30 tablet 1   No current facility-administered medications for this visit.    HISTORY OF PRESENT ILLNESS:   Oncology History  Clear cell carcinoma of kidney (HCC)  03/11/2022 Imaging   CTA Extensive right-sided pulmonary emboli with evidence of right heart strain.   Pleural based somewhat rounded areas of increased airspace opacity in the right lower lobe likely representing early changes of pulmonary infarct.   Changes consistent with the known history of right renal cell carcinoma.   05/10/2022 Imaging   CTA 1. Significantly decreased though not entirely resolved clot burden in the right lung with persistent small volume mural adherent clot. 2. Linear opacities in the right lower lobe may reflect atelectasis or developing scar. 3. Right upper pole renal mass again seen, incompletely imaged. 4. Cholelithiasis.   06/18/2022 Pathology Results   A. KIDNEY, RIGHT AND PERICAVAL LYMPH NODES, RADICAL NEPHRECTOMY:       Clear cell renal cell carcinoma, WHO / ISUP grade 3 (of 4).       Tumor size: 14.5  x 12 x 8.5 cm.       Tumor necrosis identified accounting for approximately 15% of  parenchyma.      Carcinoma disrupts capsule and invades into perinephric tissue,  adrenal parenchyma, renal vein and renal sinus.       Renal vein margin is positive for carcinoma.  Ureteral and vascular margins are negative for carcinoma.  Separated tumor satellite focus (0.5 cm).  Background renal parenchyma with focal interstitial inflammation without  glomerulosclerosis, tubal atrophy or vasculopathy.  See oncology table.   Procedure: Nephrectomy  Specimen Laterality: Right  Tumor Size: 14.5 x 12 x 8.5 cm  Tumor Focality: One focus of mail tumor (14.5 cm) with one fucus of  tumor satellite nodule (0.5 cm)   Histologic Type: Clear cell renal cell carcinoma  Sarcomatoid Features: Not identified  Rhabdoid Features: Not identified  Histologic Grade: Grade 3  Tumor Necrosis: present, 15% of the parenchymal volume  Tumor Extension: and invades into perinephric tissue, adrenal  parenchyma, renal vein and renal sinus.  Lymphatic and/or Vascular Invasion:  Not identified  Margins: Renal vein margin is positive for tumor  Regional Lymph Nodes:       Number of Lymph Nodes with Tumor: 0            Number of Lymph Nodes Examined: 1  Distant Metastasis:       Distant Site(s) Involved: Not applicable  Additional Findings in Nonneoplastic Kidney: Focal interstitial  inflammation without glomerulosclerosis, tubal atrophy or vasculopathy  Pathologic Stage Classification (pTNM, AJCC 8th Edition): pT4, pN0    07/16/2022 Initial Diagnosis   Clear cell carcinoma of kidney (HCC)   08/11/2022 -  Chemotherapy   Received first dose of pembrolizumab Patient is on Treatment Plan : RENAL Pembrolizumab (200) q21d      10/12/2022 Imaging   Presented with right sided flank pain.  CT AP 1. Status post right nephrectomy and right adrenalectomy. Poorly defined soft tissue density in the expected location of right renal fossa with extensive ill-defined nodular soft tissue thickening in the right retroperitoneum, concerning for residual or recurrent tumor and metastatic disease. 2. Enlarged liver with multiple hypodense areas suspicious for metastatic disease. Suspect large metastatic mass involving the caudate lobe of liver with more focal hypodensity in the vicinity of the IVC, probably related to tumor thrombus. 3. Triangular shaped gas and fluid collection either within the right hepatic lobe or abutting its margin given appearance on postoperative CT from June. This area measures roughly 5.4 by 4.5 cm and is indeterminate for necrotic tumor or infected fluid collection. 4. Small volume abdominopelvic ascites,  new compared to prior exam. 5. Extensive right-sided predominant peritoneal metastatic disease. Small to moderate volume of pelvic ascites. 6. Interim finding of the new 4 mm left lung base pulmonary nodule, suspicious for a metastatic nodule. 7. Small right-sided pleural effusion with right middle lobe and right lower lobe atelectasis. 8. Gallstones. Nonobstructing left kidney stones.   10/14/2022 -  Hospital Admission   Seen while inpatient findings of metastatic disease.    10/15/2022 -  Chemotherapy   Patient is on Treatment Plan : Lenvatinib 20 mg daily RENAL Pembrolizumab (200) q21d         REVIEW OF SYSTEMS:   All relevant systems were reviewed with the patient and are negative.   VITALS:  Blood pressure (!) 151/96, pulse 79, temperature (!) 97.5 F (36.4 C), resp. rate 20, weight 290 lb 12.8 oz (131.9 kg), SpO2 100%.  Wt Readings from Last  3 Encounters:  11/30/22 290 lb 12.8 oz (131.9 kg)  11/04/22 293 lb 4.8 oz (133 kg)  10/18/22 (!) 321 lb 9.6 oz (145.9 kg)    Body mass index is 35.4 kg/m.  Performance status (ECOG): 0 - Asymptomatic  PHYSICAL EXAM:   GENERAL:alert, no distress and comfortable SKIN: skin color normal, no rashes  EYES: normal, sclera clear OROPHARYNX: no exudate, no erythema    NECK: supple,  non-tender, without nodularity LYMPH:  no palpable cervical lymphadenopathy LUNGS: clear to auscultation with normal breathing effort.  No wheeze or rales HEART: regular rate & rhythm and no murmurs and no lower extremity edema ABDOMEN: abdomen soft, non-tender and nondistended Musculoskeletal: no edema NEURO: alert, fluent speech, no focal motor/sensory deficits.  Strength and sensation equal bilaterally.  LABORATORY DATA:  I have reviewed the data as listed    Component Value Date/Time   NA 138 11/30/2022 1536   K 4.3 11/30/2022 1536   CL 103 11/30/2022 1536   CO2 27 11/30/2022 1536   GLUCOSE 88 11/30/2022 1536   BUN 17 11/30/2022 1536    CREATININE 1.67 (H) 11/30/2022 1536   CALCIUM 9.6 11/30/2022 1536   PROT 8.3 (H) 11/30/2022 1536   ALBUMIN 3.7 11/30/2022 1536   AST 34 11/30/2022 1536   ALT 13 11/30/2022 1536   ALKPHOS 86 11/30/2022 1536   BILITOT 0.7 11/30/2022 1536   GFRNONAA 52 (L) 11/30/2022 1536   GFRAA >60 02/23/2019 1406    No results found for: "SPEP", "UPEP"  Lab Results  Component Value Date   WBC 4.3 11/30/2022   NEUTROABS 2.3 11/30/2022   HGB 11.3 (L) 11/30/2022   HCT 36.4 (L) 11/30/2022   MCV 85.4 11/30/2022   PLT 331 11/30/2022      Chemistry      Component Value Date/Time   NA 138 11/30/2022 1536   K 4.3 11/30/2022 1536   CL 103 11/30/2022 1536   CO2 27 11/30/2022 1536   BUN 17 11/30/2022 1536   CREATININE 1.67 (H) 11/30/2022 1536      Component Value Date/Time   CALCIUM 9.6 11/30/2022 1536   ALKPHOS 86 11/30/2022 1536   AST 34 11/30/2022 1536   ALT 13 11/30/2022 1536   BILITOT 0.7 11/30/2022 1536       RADIOGRAPHIC STUDIES: I have personally reviewed the radiological images as listed and agreed with the findings in the report. No results found.

## 2022-11-29 NOTE — Assessment & Plan Note (Signed)
Lenvatinib has potential side effects of fatigue, hypertension, edema, hand-foot syndrome, alopecia, skin rash, hyponatremia, hypothyroidism, weight loss, decreased appetite, abdominal pain, diarrhea, nausea, vomiting, mouth sores, mouth pain, increased liver enzyme, headaches, insomnia, musculoskeletal pain, cough, bleeding, and kidney toxicity, rarely heart failure, arrhythmia causing sudden cardiac death, blood clot, decreasing blood counts, encephalopathy, osteonecrosis of the jaw.  It is important to avoid dehydration, drinking plenty of fluid daily.  Report any new side effects or symptoms.   Monitor BP at home daily and in clinic after 1 week, then every 2 weeks for 2 months, and at least monthly thereafter.  LFTs (at baseline, every 2 weeks for 2 months, and at least monthly thereafter) BMP, fT4, TSH levels at baseline and monthly or as clinically indicated monitor for proteinuria at baseline and periodically during treatment (urine dipstick; if 2+ then obtain a 24-hour urine protein).  Baseline ECG (10/15/22 Qtc 443) and Echo. Repeat ECG next month ECG in select patients (congenital long QT syndrome, heart failure, bradyarrhythmias, or in those on concomitant medications known to prolong the QT interval).  Monitor for clinical signs/symptoms of cardiac dysfunction, arterial thrombosis, reversible posterior leukoencephalopathy syndrome (confirm with MRI), fistula formation, GI perforation, bleeding/hemorrhagic events, diarrhea, dehydration, and wound healing complications. Dental exam periodically during treatment.

## 2022-11-30 ENCOUNTER — Inpatient Hospital Stay: Payer: BC Managed Care – PPO

## 2022-11-30 ENCOUNTER — Inpatient Hospital Stay (HOSPITAL_BASED_OUTPATIENT_CLINIC_OR_DEPARTMENT_OTHER): Payer: BC Managed Care – PPO

## 2022-11-30 VITALS — BP 151/96 | HR 79 | Temp 97.5°F | Resp 20 | Wt 290.8 lb

## 2022-11-30 DIAGNOSIS — C641 Malignant neoplasm of right kidney, except renal pelvis: Secondary | ICD-10-CM

## 2022-11-30 DIAGNOSIS — I2699 Other pulmonary embolism without acute cor pulmonale: Secondary | ICD-10-CM

## 2022-11-30 DIAGNOSIS — I159 Secondary hypertension, unspecified: Secondary | ICD-10-CM

## 2022-11-30 DIAGNOSIS — Z9189 Other specified personal risk factors, not elsewhere classified: Secondary | ICD-10-CM | POA: Diagnosis not present

## 2022-11-30 DIAGNOSIS — Z86711 Personal history of pulmonary embolism: Secondary | ICD-10-CM

## 2022-11-30 DIAGNOSIS — Z5112 Encounter for antineoplastic immunotherapy: Secondary | ICD-10-CM | POA: Diagnosis not present

## 2022-11-30 LAB — CMP (CANCER CENTER ONLY)
ALT: 13 U/L (ref 0–44)
AST: 34 U/L (ref 15–41)
Albumin: 3.7 g/dL (ref 3.5–5.0)
Alkaline Phosphatase: 86 U/L (ref 38–126)
Anion gap: 8 (ref 5–15)
BUN: 17 mg/dL (ref 6–20)
CO2: 27 mmol/L (ref 22–32)
Calcium: 9.6 mg/dL (ref 8.9–10.3)
Chloride: 103 mmol/L (ref 98–111)
Creatinine: 1.67 mg/dL — ABNORMAL HIGH (ref 0.61–1.24)
GFR, Estimated: 52 mL/min — ABNORMAL LOW (ref >60)
Glucose, Bld: 88 mg/dL (ref 70–99)
Potassium: 4.3 mmol/L (ref 3.5–5.1)
Sodium: 138 mmol/L (ref 135–145)
Total Bilirubin: 0.7 mg/dL (ref <1.2)
Total Protein: 8.3 g/dL — ABNORMAL HIGH (ref 6.5–8.1)

## 2022-11-30 LAB — CBC WITH DIFFERENTIAL (CANCER CENTER ONLY)
Abs Immature Granulocytes: 0.01 10*3/uL (ref 0.00–0.07)
Basophils Absolute: 0 10*3/uL (ref 0.0–0.1)
Basophils Relative: 1 %
Eosinophils Absolute: 0.3 10*3/uL (ref 0.0–0.5)
Eosinophils Relative: 7 %
HCT: 36.4 % — ABNORMAL LOW (ref 39.0–52.0)
Hemoglobin: 11.3 g/dL — ABNORMAL LOW (ref 13.0–17.0)
Immature Granulocytes: 0 %
Lymphocytes Relative: 32 %
Lymphs Abs: 1.4 10*3/uL (ref 0.7–4.0)
MCH: 26.5 pg (ref 26.0–34.0)
MCHC: 31 g/dL (ref 30.0–36.0)
MCV: 85.4 fL (ref 80.0–100.0)
Monocytes Absolute: 0.3 10*3/uL (ref 0.1–1.0)
Monocytes Relative: 7 %
Neutro Abs: 2.3 10*3/uL (ref 1.7–7.7)
Neutrophils Relative %: 53 %
Platelet Count: 331 10*3/uL (ref 150–400)
RBC: 4.26 MIL/uL (ref 4.22–5.81)
RDW: 16.6 % — ABNORMAL HIGH (ref 11.5–15.5)
WBC Count: 4.3 10*3/uL (ref 4.0–10.5)
nRBC: 0 % (ref 0.0–0.2)

## 2022-11-30 LAB — T4, FREE: Free T4: 0.93 ng/dL (ref 0.61–1.12)

## 2022-11-30 NOTE — Assessment & Plan Note (Addendum)
Increase telmisartan to 40 mg daily Continue metoprolol 50 mg daily. Monitor BP daily at rest.

## 2022-12-01 ENCOUNTER — Inpatient Hospital Stay: Payer: BC Managed Care – PPO

## 2022-12-01 ENCOUNTER — Ambulatory Visit (HOSPITAL_COMMUNITY)
Admission: RE | Admit: 2022-12-01 | Discharge: 2022-12-01 | Disposition: A | Discharge status: Home / Self Care | Payer: BC Managed Care – PPO | Source: Ambulatory Visit

## 2022-12-01 ENCOUNTER — Ambulatory Visit: Payer: BC Managed Care – PPO | Admitting: Dietician

## 2022-12-01 VITALS — BP 116/84 | HR 78 | Temp 98.2°F | Resp 18

## 2022-12-01 DIAGNOSIS — I10 Essential (primary) hypertension: Secondary | ICD-10-CM | POA: Insufficient documentation

## 2022-12-01 DIAGNOSIS — C641 Malignant neoplasm of right kidney, except renal pelvis: Secondary | ICD-10-CM

## 2022-12-01 DIAGNOSIS — Z9189 Other specified personal risk factors, not elsewhere classified: Secondary | ICD-10-CM

## 2022-12-01 DIAGNOSIS — I2699 Other pulmonary embolism without acute cor pulmonale: Secondary | ICD-10-CM

## 2022-12-01 DIAGNOSIS — Z5112 Encounter for antineoplastic immunotherapy: Secondary | ICD-10-CM | POA: Diagnosis not present

## 2022-12-01 LAB — TSH: TSH: 3.38 u[IU]/mL (ref 0.350–4.500)

## 2022-12-01 LAB — ECHOCARDIOGRAM LIMITED
S' Lateral: 2.6 cm
Single Plane A4C EF: 63.2 %

## 2022-12-01 MED ORDER — SODIUM CHLORIDE 0.9 % IV SOLN
200.0000 mg | Freq: Once | INTRAVENOUS | Status: AC
  Administered 2022-12-01: 200 mg via INTRAVENOUS
  Filled 2022-12-01: qty 200

## 2022-12-01 MED ORDER — SODIUM CHLORIDE 0.9 % IV SOLN
Freq: Once | INTRAVENOUS | Status: AC
Start: 2022-12-01 — End: 2022-12-01

## 2022-12-01 NOTE — Progress Notes (Signed)
Ok to treat with elevated Scr Per Dr. Cherly Hensen

## 2022-12-01 NOTE — Progress Notes (Signed)
  Echocardiogram 2D Echocardiogram has been performed.  Marcus Burns Marcus Burns 12/01/2022, 11:18 AM

## 2022-12-10 ENCOUNTER — Other Ambulatory Visit: Payer: Self-pay

## 2022-12-10 MED ORDER — TRAMADOL HCL 50 MG PO TABS: 50.0000 mg | ORAL_TABLET | Freq: Four times a day (QID) | ORAL | 0 refills | Status: DC | PRN

## 2022-12-10 MED ORDER — TRAZODONE HCL 50 MG PO TABS: 50.0000 mg | ORAL_TABLET | Freq: Every day | ORAL | 1 refills | Status: DC

## 2022-12-10 NOTE — Progress Notes (Signed)
Trazodone 50 mg at bedtime ordered. Tramadol discontinued. Patient reports he requested tramadol by mistake.

## 2022-12-10 NOTE — Progress Notes (Signed)
Tramadol refilled to [facility] per patient request. Discontinued oxycodone.

## 2022-12-18 ENCOUNTER — Other Ambulatory Visit: Payer: Self-pay

## 2022-12-23 NOTE — Assessment & Plan Note (Signed)
Lenvatinib has potential side effects of fatigue, hypertension, edema, hand-foot syndrome, alopecia, skin rash, hyponatremia, hypothyroidism, weight loss, decreased appetite, abdominal pain, diarrhea, nausea, vomiting, mouth sores, mouth pain, increased liver enzyme, headaches, insomnia, musculoskeletal pain, cough, bleeding, and kidney toxicity, rarely heart failure, arrhythmia causing sudden cardiac death, blood clot, decreasing blood counts, encephalopathy, osteonecrosis of the jaw.  It is important to avoid dehydration, drinking plenty of fluid daily.  Report any new side effects or symptoms.   Monitor BP at home daily and in clinic after 1 week, then every 2 weeks for 2 months, and at least monthly thereafter.  LFTs (at baseline, every 2 weeks for 2 months, and at least monthly thereafter) BMP, fT4, TSH levels at baseline and monthly or as clinically indicated monitor for proteinuria at baseline and periodically during treatment (urine dipstick; if 2+ then obtain a 24-hour urine protein).  Baseline ECG (10/15/22 Qtc 443) and Echo.  Repeat ECG on 11/26 showed Qtc 432 ms. ECG in select patients (congenital long QT syndrome, heart failure, bradyarrhythmias, or in those on concomitant medications known to prolong the QT interval).  Monitor for clinical signs/symptoms of cardiac dysfunction, arterial thrombosis, reversible posterior leukoencephalopathy syndrome (confirm with MRI), fistula formation, GI perforation, bleeding/hemorrhagic events, diarrhea, dehydration, and wound healing complications. Dental exam periodically during treatment.

## 2022-12-23 NOTE — Assessment & Plan Note (Signed)
Improved symptoms, now on eliquis anticoagulation Bleeding precautions

## 2022-12-23 NOTE — Assessment & Plan Note (Signed)
10/15/22 started Lenvatinib 20 mg once daily Continue pembro every 3 weeks         10/28/22 MRI of brian. results negative for brain metastases Referral made for genetic testing Monitor for new symptoms Repeat CT in Jan 2025. Ordered today

## 2022-12-23 NOTE — Progress Notes (Signed)
Patient Care Team: Corwin Levins, MD as PCP - General  Clinic Day:  12/24/2022  Referring physician: Rachel Moulds, MD  ASSESSMENT & PLAN:   Assessment & Plan: Marcus Burns is a 42 year old pleasant male with history of right renal cell carcinoma with metastatic disease here for follow up.  Imaging showed peritoneal carcinomatosis, pulmonary nodules, liver metastases.    Overall tolerating well. Clinically with response. Hypertension from TKI uncontrolled. Will increase dose of telmisartan today. ECG done in the room today to review QTc. Will need to monitor thyroid function.   Diagnosis: mRCC Treatment:  10/15/2022 lenvatinib. 20 mg daily with pembrolizumab  He is tolerating treatment well.  Will continue.  Restaging planned for end of January.  Clear cell carcinoma of kidney (HCC) 10/15/22 started Lenvatinib 20 mg once daily Continue pembro every 3 weeks         10/28/22 MRI of brian. results negative for brain metastases Referral made for genetic testing Monitor for new symptoms Repeat CT in Jan 2025. Ordered today  At risk for side effect of medication Lenvatinib has potential side effects of fatigue, hypertension, edema, hand-foot syndrome, alopecia, skin rash, hyponatremia, hypothyroidism, weight loss, decreased appetite, abdominal pain, diarrhea, nausea, vomiting, mouth sores, mouth pain, increased liver enzyme, headaches, insomnia, musculoskeletal pain, cough, bleeding, and kidney toxicity, rarely heart failure, arrhythmia causing sudden cardiac death, blood clot, decreasing blood counts, encephalopathy, osteonecrosis of the jaw.  It is important to avoid dehydration, drinking plenty of fluid daily.  Report any new side effects or symptoms.   Monitor BP at home daily and in clinic after 1 week, then every 2 weeks for 2 months, and at least monthly thereafter.  LFTs (at baseline, every 2 weeks for 2 months, and at least monthly thereafter) BMP, fT4, TSH levels at baseline and  monthly or as clinically indicated monitor for proteinuria at baseline and periodically during treatment (urine dipstick; if 2+ then obtain a 24-hour urine protein).  Baseline ECG (10/15/22 Qtc 443) and Echo.  Repeat ECG on 11/26 showed Qtc 432 ms. ECG in select patients (congenital long QT syndrome, heart failure, bradyarrhythmias, or in those on concomitant medications known to prolong the QT interval).  Monitor for clinical signs/symptoms of cardiac dysfunction, arterial thrombosis, reversible posterior leukoencephalopathy syndrome (confirm with MRI), fistula formation, GI perforation, bleeding/hemorrhagic events, diarrhea, dehydration, and wound healing complications. Dental exam periodically during treatment.   Acute pulmonary embolism (HCC) Improved symptoms, now on eliquis anticoagulation Bleeding precautions  Insomnia Continue trazodone 50 mg nightly  Hypertension Increase telmisartan to 40 mg daily Continue metoprolol 50 mg daily. Monitor BP daily at rest.    The patient understands the plans discussed today and is in agreement with them.  He knows to contact our office if he develops concerns prior to his next appointment.  [name], MD  [facility] - A DEPT OF [facility] [address] [place] [address] Dept: [phone] Dept Fax: [phone]   Orders Placed This Encounter  Procedures   CT CHEST ABDOMEN PELVIS W CONTRAST    Standing Status:   Future    Expected Date:   01/25/2023    Expiration Date:   12/24/2023    If indicated for the ordered procedure, I authorize the administration of contrast media per Radiology protocol:   Yes    Does the patient have a contrast media/X-ray dye allergy?:   No    Preferred imaging location?:  [facility]    If indicated for the ordered procedure, I authorize the administration of oral contrast media per Radiology protocol:   Yes      CHIEF  COMPLAINT:  CC: mRCC  Current Treatment:  len/pembro  INTERVAL HISTORY:  Marcus Burns is here today for repeat clinical assessment. He denies fevers or chills. He denies pain. His appetite is good.   BP at home 130's SBP. Nothing above 140. No chest pain, pressure, headache.  LFTs improved since started treatment.  No difficulty urinating and drinking lots of fluid.  No palpitation or irregular heart best.  No edema, short of breath, leg swelling, focal weakness, bleeding, diarrhea, and wound.  Dental issue: none  He calls for delivery of lenvatinib.   I have reviewed the past medical history, past surgical history, social history and family history with the patient and they are unchanged from previous note.  ALLERGIES:  is allergic to amlodipine and doxycycline.  MEDICATIONS:  Current Outpatient Medications  Medication Sig Dispense Refill   acetaminophen (TYLENOL) 500 MG tablet Take 1 tablet (500 mg total) by mouth every 6 (six) hours as needed for moderate pain (pain.).     albuterol (VENTOLIN HFA) 108 (90 Base) MCG/ACT inhaler Inhale 2 puffs into the lungs every 6 (six) hours as needed for wheezing or shortness of breath. 8 g 1   apixaban (ELIQUIS) 5 MG TABS tablet Take 1 tablet (5 mg total) by mouth 2 (two) times daily. 60 tablet 6   cetirizine (ZYRTEC) 10 MG tablet Take 10 mg by mouth daily.     clonazePAM (KLONOPIN) 0.5 MG tablet Take 1 tablet (0.5 mg total) by mouth 2 (two) times daily as needed for anxiety. (Patient taking differently: Take 0.5 mg by mouth at bedtime.) 60 tablet 1   docusate sodium (COLACE) 100 MG capsule Take 1 capsule (100 mg total) by mouth 2 (two) times daily. (Patient taking differently: Take 100 mg by mouth as needed for mild constipation or moderate constipation.)     iron polysaccharides (NU-IRON) 150 MG capsule Take 1 capsule (150 mg total) by mouth daily. (Patient taking differently: Take 150 mg by mouth every other day.) 90 capsule 1   lenvatinib 20  mg daily dose (LENVIMA) 2 x 10 MG capsule Take 2 capsules (20 mg total) by mouth daily. 60 capsule 11   metoprolol succinate (TOPROL-XL) 50 MG 24 hr tablet Take 1 tablet (50 mg total) by mouth daily. Take with or immediately following a meal. 90 tablet 3   ondansetron (ZOFRAN) 8 MG tablet Take 1 tablet (8 mg total) by mouth every 8 (eight) hours as needed for nausea or vomiting. 30 tablet 1   prochlorperazine (COMPAZINE) 10 MG tablet Take 1 tablet (10 mg total) by mouth every 6 (six) hours as needed for nausea or vomiting. 30 tablet 1   traZODone (DESYREL) 50 MG tablet Take 1 tablet (50 mg total) by mouth at bedtime. 90 tablet 1   No current facility-administered medications for this visit.    HISTORY OF PRESENT ILLNESS:   Oncology History  Clear cell carcinoma of kidney (HCC)  03/11/2022 Imaging   CTA Extensive right-sided pulmonary emboli with evidence of right heart strain.   Pleural based somewhat rounded areas of increased airspace opacity in the right lower lobe likely representing early changes of pulmonary infarct.   Changes consistent with the known history of right renal cell carcinoma.   05/10/2022 Imaging   CTA 1. Significantly decreased though not entirely resolved  clot burden in the right lung with persistent small volume mural adherent clot. 2. Linear opacities in the right lower lobe may reflect atelectasis or developing scar. 3. Right upper pole renal mass again seen, incompletely imaged. 4. Cholelithiasis.   06/18/2022 Pathology Results   A. KIDNEY, RIGHT AND PERICAVAL LYMPH NODES, RADICAL NEPHRECTOMY:       Clear cell renal cell carcinoma, WHO / ISUP grade 3 (of 4).       Tumor size: 14.5 x 12 x 8.5 cm.       Tumor necrosis identified accounting for approximately 15% of  parenchyma.      Carcinoma disrupts capsule and invades into perinephric tissue,  adrenal parenchyma, renal vein and renal sinus.       Renal vein margin is positive for carcinoma.  Ureteral  and vascular margins are negative for carcinoma.  Separated tumor satellite focus (0.5 cm).  Background renal parenchyma with focal interstitial inflammation without  glomerulosclerosis, tubal atrophy or vasculopathy.  See oncology table.   Procedure: Nephrectomy  Specimen Laterality: Right  Tumor Size: 14.5 x 12 x 8.5 cm  Tumor Focality: One focus of mail tumor (14.5 cm) with one fucus of  tumor satellite nodule (0.5 cm)  Histologic Type: Clear cell renal cell carcinoma  Sarcomatoid Features: Not identified  Rhabdoid Features: Not identified  Histologic Grade: Grade 3  Tumor Necrosis: present, 15% of the parenchymal volume  Tumor Extension: and invades into perinephric tissue, adrenal  parenchyma, renal vein and renal sinus.  Lymphatic and/or Vascular Invasion:  Not identified  Margins: Renal vein margin is positive for tumor  Regional Lymph Nodes:       Number of Lymph Nodes with Tumor: 0            Number of Lymph Nodes Examined: 1  Distant Metastasis:       Distant Site(s) Involved: Not applicable  Additional Findings in Nonneoplastic Kidney: Focal interstitial  inflammation without glomerulosclerosis, tubal atrophy or vasculopathy  Pathologic Stage Classification (pTNM, AJCC 8th Edition): pT4, pN0    07/16/2022 Initial Diagnosis   Clear cell carcinoma of kidney (HCC)   08/11/2022 -  Chemotherapy   Received first dose of pembrolizumab Patient is on Treatment Plan : RENAL Pembrolizumab (200) q21d      10/12/2022 Imaging   Presented with right sided flank pain.  CT AP 1. Status post right nephrectomy and right adrenalectomy. Poorly defined soft tissue density in the expected location of right renal fossa with extensive ill-defined nodular soft tissue thickening in the right retroperitoneum, concerning for residual or recurrent tumor and metastatic disease. 2. Enlarged liver with multiple hypodense areas suspicious for metastatic disease. Suspect large metastatic mass  involving the caudate lobe of liver with more focal hypodensity in the vicinity of the IVC, probably related to tumor thrombus. 3. Triangular shaped gas and fluid collection either within the right hepatic lobe or abutting its margin given appearance on postoperative CT from June. This area measures roughly 5.4 by 4.5 cm and is indeterminate for necrotic tumor or infected fluid collection. 4. Small volume abdominopelvic ascites, new compared to prior exam. 5. Extensive right-sided predominant peritoneal metastatic disease. Small to moderate volume of pelvic ascites. 6. Interim finding of the new 4 mm left lung base pulmonary nodule, suspicious for a metastatic nodule. 7. Small right-sided pleural effusion with right middle lobe and right lower lobe atelectasis. 8. Gallstones. Nonobstructing left kidney stones.   10/14/2022 -  Hospital Admission   Seen while inpatient findings of  metastatic disease.    10/15/2022 -  Chemotherapy   Patient is on Treatment Plan : Lenvatinib 20 mg daily RENAL Pembrolizumab (200) q21d         REVIEW OF SYSTEMS:   All relevant systems were reviewed with the patient and are negative.   VITALS:  Blood pressure (!) 158/98, pulse 89, temperature 97.7 F (36.5 C), resp. rate 20, weight 297 lb 3.2 oz (134.8 kg), SpO2 100%.  Wt Readings from Last 3 Encounters:  12/24/22 297 lb 3.2 oz (134.8 kg)  11/30/22 290 lb 12.8 oz (131.9 kg)  11/04/22 293 lb 4.8 oz (133 kg)    Body mass index is 36.18 kg/m.  Performance status (ECOG): 0 - Asymptomatic  PHYSICAL EXAM:   GENERAL:alert, no distress and comfortable SKIN: skin color normal, no rashes  EYES: normal, sclera clear OROPHARYNX: no exudate, no erythema    NECK: supple,  non-tender, without nodularity LYMPH:  no palpable cervical lymphadenopathy LUNGS: clear to auscultation with normal breathing effort.  No wheeze or rales HEART: regular rate & rhythm and no murmurs and no lower extremity  edema ABDOMEN: abdomen soft, non-tender and nondistended Musculoskeletal: no edema NEURO: alert, fluent speech, no focal motor/sensory deficits.  Strength and sensation equal bilaterally.  LABORATORY DATA:  I have reviewed the data as listed    Component Value Date/Time   NA 140 12/24/2022 0908   K 4.4 12/24/2022 0908   CL 107 12/24/2022 0908   CO2 27 12/24/2022 0908   GLUCOSE 96 12/24/2022 0908   BUN 14 12/24/2022 0908   CREATININE 1.40 (H) 12/24/2022 0908   CALCIUM 9.7 12/24/2022 0908   PROT 7.3 12/24/2022 0908   ALBUMIN 3.8 12/24/2022 0908   AST 47 (H) 12/24/2022 0908   ALT 12 12/24/2022 0908   ALKPHOS 86 12/24/2022 0908   BILITOT 0.4 12/24/2022 0908   GFRNONAA >60 12/24/2022 0908   GFRAA >60 02/23/2019 1406    No results found for: "SPEP", "UPEP"  Lab Results  Component Value Date   WBC 5.3 12/24/2022   NEUTROABS 3.4 12/24/2022   HGB 11.8 (L) 12/24/2022   HCT 35.6 (L) 12/24/2022   MCV 83.4 12/24/2022   PLT 232 12/24/2022      Chemistry      Component Value Date/Time   NA 140 12/24/2022 0908   K 4.4 12/24/2022 0908   CL 107 12/24/2022 0908   CO2 27 12/24/2022 0908   BUN 14 12/24/2022 0908   CREATININE 1.40 (H) 12/24/2022 0908      Component Value Date/Time   CALCIUM 9.7 12/24/2022 0908   ALKPHOS 86 12/24/2022 0908   AST 47 (H) 12/24/2022 0908   ALT 12 12/24/2022 0908   BILITOT 0.4 12/24/2022 0908       RADIOGRAPHIC STUDIES: I have personally reviewed the radiological images as listed and agreed with the findings in the report. ECHOCARDIOGRAM LIMITED Result Date: 12/01/2022    ECHOCARDIOGRAM LIMITED REPORT   Patient Name:   Marcus Burns Date of Exam: 12/01/2022 Medical Rec #:  [id number]         Height:       76.0 in Accession #:    [id number]        Weight:       290.8 lb Date of Birth:  [birth year]         BSA:          2.597 m Patient Age:    74 years  BP:           116/84 mmHg Patient Gender: M                 HR:           56 bpm. Exam  Location:  Inpatient Procedure: Limited Echo, Cardiac Doppler, Color Doppler, 3D Echo and Strain            Analysis Indications:    Chemo  History:        Patient has prior history of Echocardiogram examinations, most                 recent 03/11/2022. Risk Factors:Hypertension.  Sonographer:    Karma Ganja Referring Phys: [id number] [name]  Sonographer Comments: Global longitudinal strain was attempted. IMPRESSIONS  1. Left ventricular ejection fraction, by estimation, is 55 to 60%. The left ventricle has normal function. There is mild left ventricular hypertrophy. The average left ventricular global longitudinal strain is -15.0 %. The global longitudinal strain is  abnormal.  2. The mitral valve is normal in structure. No evidence of mitral valve regurgitation.  3. The aortic valve is grossly normal. Aortic valve regurgitation is not visualized.  4. The inferior vena cava is normal in size with greater than 50% respiratory variability, suggesting right atrial pressure of 3 mmHg. Conclusion(s)/Recommendation(s): LVEF is normal. Can continue cancer therapy. FINDINGS  Left Ventricle: Left ventricular ejection fraction, by estimation, is 55 to 60%. The left ventricle has normal function. The average left ventricular global longitudinal strain is -15.0 %. The global longitudinal strain is abnormal. There is mild left ventricular hypertrophy. Mitral Valve: The mitral valve is normal in structure. Tricuspid Valve: Tricuspid valve regurgitation is not demonstrated. Aortic Valve: The aortic valve is grossly normal. Aortic valve regurgitation is not visualized. Aorta: The aortic root and ascending aorta are structurally normal, with no evidence of dilitation. Venous: The inferior vena cava is normal in size with greater than 50% respiratory variability, suggesting right atrial pressure of 3 mmHg. IAS/Shunts: The interatrial septum was not well visualized. Additional Comments: Spectral Doppler performed. Color Doppler  performed.  LEFT VENTRICLE PLAX 2D LVIDd:         4.20 cm LVIDs:         2.60 cm      2D Longitudinal Strain LV PW:         1.10 cm      2D Strain GLS Avg:     -15.0 % LV IVS:        1.10 cm LVOT diam:     2.00 cm LVOT Area:     3.14 cm                             3D Volume EF:                             3D EF:        49 % LV Volumes (MOD)            LV EDV:       172 ml LV vol d, MOD A4C: 113.0 ml LV ESV:       87 ml LV vol s, MOD A4C: 41.6 ml  LV SV:        85 ml LV SV MOD A4C:     113.0 ml LEFT ATRIUM  Index LA diam:      3.80 cm 1.46 cm/m LA Vol (A2C): 97.0 ml 37.35 ml/m   AORTA Ao Root diam: 3.50 cm Ao Asc diam:  3.20 cm  SHUNTS Systemic Diam: 2.00 cm Carolan Clines Electronically signed by Carolan Clines Signature Date/Time: 12/01/2022/5:50:31 PM    Final

## 2022-12-24 ENCOUNTER — Inpatient Hospital Stay: Payer: BC Managed Care – PPO | Attending: Hematology and Oncology

## 2022-12-24 ENCOUNTER — Inpatient Hospital Stay (HOSPITAL_BASED_OUTPATIENT_CLINIC_OR_DEPARTMENT_OTHER): Payer: BC Managed Care – PPO

## 2022-12-24 ENCOUNTER — Inpatient Hospital Stay: Payer: BC Managed Care – PPO

## 2022-12-24 VITALS — BP 158/98 | HR 89 | Temp 97.7°F | Resp 20 | Wt 297.2 lb

## 2022-12-24 DIAGNOSIS — I159 Secondary hypertension, unspecified: Secondary | ICD-10-CM

## 2022-12-24 DIAGNOSIS — I10 Essential (primary) hypertension: Secondary | ICD-10-CM | POA: Insufficient documentation

## 2022-12-24 DIAGNOSIS — G4700 Insomnia, unspecified: Secondary | ICD-10-CM | POA: Insufficient documentation

## 2022-12-24 DIAGNOSIS — C786 Secondary malignant neoplasm of retroperitoneum and peritoneum: Secondary | ICD-10-CM | POA: Diagnosis not present

## 2022-12-24 DIAGNOSIS — C641 Malignant neoplasm of right kidney, except renal pelvis: Secondary | ICD-10-CM

## 2022-12-24 DIAGNOSIS — I2699 Other pulmonary embolism without acute cor pulmonale: Secondary | ICD-10-CM | POA: Diagnosis not present

## 2022-12-24 DIAGNOSIS — C787 Secondary malignant neoplasm of liver and intrahepatic bile duct: Secondary | ICD-10-CM

## 2022-12-24 DIAGNOSIS — Z79620 Long term (current) use of immunosuppressive biologic: Secondary | ICD-10-CM | POA: Diagnosis not present

## 2022-12-24 DIAGNOSIS — Z5112 Encounter for antineoplastic immunotherapy: Secondary | ICD-10-CM | POA: Diagnosis present

## 2022-12-24 DIAGNOSIS — Z9189 Other specified personal risk factors, not elsewhere classified: Secondary | ICD-10-CM

## 2022-12-24 LAB — CBC WITH DIFFERENTIAL (CANCER CENTER ONLY)
Abs Immature Granulocytes: 0.01 10*3/uL (ref 0.00–0.07)
Basophils Absolute: 0 10*3/uL (ref 0.0–0.1)
Basophils Relative: 0 %
Eosinophils Absolute: 0.5 10*3/uL (ref 0.0–0.5)
Eosinophils Relative: 9 %
HCT: 35.6 % — ABNORMAL LOW (ref 39.0–52.0)
Hemoglobin: 11.8 g/dL — ABNORMAL LOW (ref 13.0–17.0)
Immature Granulocytes: 0 %
Lymphocytes Relative: 20 %
Lymphs Abs: 1 10*3/uL (ref 0.7–4.0)
MCH: 27.6 pg (ref 26.0–34.0)
MCHC: 33.1 g/dL (ref 30.0–36.0)
MCV: 83.4 fL (ref 80.0–100.0)
Monocytes Absolute: 0.4 10*3/uL (ref 0.1–1.0)
Monocytes Relative: 7 %
Neutro Abs: 3.4 10*3/uL (ref 1.7–7.7)
Neutrophils Relative %: 64 %
Platelet Count: 232 10*3/uL (ref 150–400)
RBC: 4.27 MIL/uL (ref 4.22–5.81)
RDW: 17.3 % — ABNORMAL HIGH (ref 11.5–15.5)
WBC Count: 5.3 10*3/uL (ref 4.0–10.5)
nRBC: 0 % (ref 0.0–0.2)

## 2022-12-24 LAB — CMP (CANCER CENTER ONLY)
ALT: 12 U/L (ref 0–44)
AST: 47 U/L — ABNORMAL HIGH (ref 15–41)
Albumin: 3.8 g/dL (ref 3.5–5.0)
Alkaline Phosphatase: 86 U/L (ref 38–126)
Anion gap: 6 (ref 5–15)
BUN: 14 mg/dL (ref 6–20)
CO2: 27 mmol/L (ref 22–32)
Calcium: 9.7 mg/dL (ref 8.9–10.3)
Chloride: 107 mmol/L (ref 98–111)
Creatinine: 1.4 mg/dL — ABNORMAL HIGH (ref 0.61–1.24)
GFR, Estimated: >60 mL/min (ref >60)
Glucose, Bld: 96 mg/dL (ref 70–99)
Potassium: 4.4 mmol/L (ref 3.5–5.1)
Sodium: 140 mmol/L (ref 135–145)
Total Bilirubin: 0.4 mg/dL (ref <1.2)
Total Protein: 7.3 g/dL (ref 6.5–8.1)

## 2022-12-24 LAB — T4, FREE: Free T4: 1.06 ng/dL (ref 0.61–1.12)

## 2022-12-24 LAB — TSH: TSH: 5.53 u[IU]/mL — ABNORMAL HIGH (ref 0.350–4.500)

## 2022-12-24 LAB — LACTATE DEHYDROGENASE: LDH: 359 U/L — ABNORMAL HIGH (ref 98–192)

## 2022-12-24 MED ORDER — METOPROLOL SUCCINATE ER 50 MG PO TB24: 50.0000 mg | ORAL_TABLET | Freq: Every day | ORAL | 3 refills | Status: DC

## 2022-12-24 MED ORDER — SODIUM CHLORIDE 0.9 % IV SOLN: Freq: Once | INTRAVENOUS | Status: AC

## 2022-12-24 MED ORDER — SODIUM CHLORIDE 0.9 % IV SOLN
200.0000 mg | Freq: Once | INTRAVENOUS | Status: AC
  Administered 2022-12-24: 200 mg via INTRAVENOUS
  Filled 2022-12-24: qty 200

## 2022-12-24 NOTE — Patient Instructions (Signed)

## 2022-12-24 NOTE — Assessment & Plan Note (Signed)
Continue trazodone 50 mg nightly.

## 2022-12-24 NOTE — Assessment & Plan Note (Addendum)
 Increase telmisartan to 40 mg daily Continue metoprolol 50 mg daily. Monitor BP daily at rest.

## 2022-12-29 ENCOUNTER — Other Ambulatory Visit: Payer: Self-pay

## 2023-01-10 ENCOUNTER — Telehealth: Payer: Self-pay | Admitting: Pharmacy Technician

## 2023-01-10 ENCOUNTER — Encounter: Payer: Self-pay | Admitting: Internal Medicine

## 2023-01-10 ENCOUNTER — Other Ambulatory Visit (HOSPITAL_COMMUNITY): Payer: Self-pay

## 2023-01-10 MED ORDER — TELMISARTAN 20 MG PO TABS: 20.0000 mg | ORAL_TABLET | Freq: Every day | ORAL | 3 refills | Status: DC

## 2023-01-10 NOTE — Telephone Encounter (Signed)
 Oral Oncology Patient Advocate Encounter   Received notification that prior authorization for Lenvima  is required.   PA submitted on 01/10/23 Key [id number] Status is pending     Estefana Moellers, CPhT-Adv Oncology Pharmacy Patient Advocate [facility] Direct Number: [phone]  Fax: [phone]

## 2023-01-10 NOTE — Telephone Encounter (Signed)
 Oral Oncology Patient Advocate Encounter  Prior Authorization for lenvima  has been approved.    PA# [id number] Effective dates: 01/10/23 through 01/10/24  Patient must fill at [facility].    Marcus Burns, CPhT-Adv Oncology Pharmacy Patient Advocate [facility] Direct Number: [phone]  Fax: [phone]

## 2023-01-11 ENCOUNTER — Ambulatory Visit: Payer: BC Managed Care – PPO

## 2023-01-11 ENCOUNTER — Other Ambulatory Visit: Payer: BC Managed Care – PPO

## 2023-01-11 ENCOUNTER — Telehealth: Payer: Self-pay | Admitting: Pharmacy Technician

## 2023-01-11 NOTE — Telephone Encounter (Signed)
 Oral Oncology Patient Advocate Encounter   Was successful in obtaining a copay card for Lenvima .  This copay card will make the patients copay $0.  The card has an annual maximum of $10,000 in benefits.    The billing information is as follows and has been shared with [facility].   RxBin: [id number] Member ID: [id number] Group ID: [id number]   Estefana Moellers, CPhT-Adv Oncology Pharmacy Patient Advocate [facility] Direct Number: [phone]  Fax: [phone]

## 2023-01-13 ENCOUNTER — Other Ambulatory Visit: Payer: Self-pay | Admitting: Internal Medicine

## 2023-01-13 DIAGNOSIS — N189 Chronic kidney disease, unspecified: Secondary | ICD-10-CM | POA: Insufficient documentation

## 2023-01-13 NOTE — Assessment & Plan Note (Signed)
 Solitary kidney Monitor each cycle

## 2023-01-13 NOTE — Assessment & Plan Note (Signed)
 Continue apixaban 5 mg twice daily

## 2023-01-13 NOTE — Assessment & Plan Note (Signed)
 Continue telmisartan to 40 mg daily Continue metoprolol 50 mg daily. Monitor BP daily at rest.

## 2023-01-13 NOTE — Assessment & Plan Note (Signed)
 10/15/22 started Lenvatinib 20 mg once daily Continue pembro every 3 weeks         10/28/22 MRI of brian. results negative for brain metastases Referral made for genetic testing Monitor for new symptoms Repeat CT in Jan 2025. Ordered today

## 2023-01-13 NOTE — Assessment & Plan Note (Signed)
 Lenvatinib has potential side effects of fatigue, hypertension, edema, hand-foot syndrome, alopecia, skin rash, hyponatremia, hypothyroidism, weight loss, decreased appetite, abdominal pain, diarrhea, nausea, vomiting, mouth sores, mouth pain, increased liver enzyme, headaches, insomnia, musculoskeletal pain, cough, bleeding, and kidney toxicity, rarely heart failure, arrhythmia causing sudden cardiac death, blood clot, decreasing blood counts, encephalopathy, osteonecrosis of the jaw.  It is important to avoid dehydration, drinking plenty of fluid daily.  Report any new side effects or symptoms.   Monitor BP at home daily and in clinic after 1 week, then every 2 weeks for 2 months, and at least monthly thereafter.  LFTs (at baseline, every 2 weeks for 2 months, and at least monthly thereafter) BMP, fT4, TSH levels at baseline and monthly or as clinically indicated monitor for proteinuria at baseline and periodically during treatment (urine dipstick; if 2+ then obtain a 24-hour urine protein).  Baseline ECG (10/15/22 Qtc 443) and Echo.  Repeat ECG on 11/26 showed Qtc 432 ms. ECG in select patients (congenital long QT syndrome, heart failure, bradyarrhythmias, or in those on concomitant medications known to prolong the QT interval).  Monitor for clinical signs/symptoms of cardiac dysfunction, arterial thrombosis, reversible posterior leukoencephalopathy syndrome (confirm with MRI), fistula formation, GI perforation, bleeding/hemorrhagic events, diarrhea, dehydration, and wound healing complications. Dental exam periodically during treatment.

## 2023-01-13 NOTE — Progress Notes (Signed)
 Patient Care Team: Norleen Lynwood ORN, MD as PCP - General  Clinic Day:  01/14/2023  Referring physician: Loretha Ash, MD  ASSESSMENT & PLAN:   Assessment & Plan: Marcus Burns is a 43 year old pleasant male with history of right renal cell carcinoma with metastatic disease here for follow up.  Imaging showed peritoneal carcinomatosis, pulmonary nodules, liver metastases.    Overall tolerating well. Clinically with response. Hypertension from TKI uncontrolled. Will increase dose of telmisartan  today. ECG done in the room today to review QTc. Will need to monitor thyroid  function.   Diagnosis: mRCC Treatment:  10/15/2022 lenvatinib . 20 mg daily with pembrolizumab    He is tolerating treatment well.  Will continue.  Restaging planned for end of January.  Clear cell carcinoma of kidney (HCC) 10/15/22 started Lenvatinib  20 mg once daily Continue pembro every 3 weeks         10/28/22 MRI of brian. results negative for brain metastases Referral made for genetic testing Monitor for new symptoms Repeat CT in Jan 2025. Ordered today  History of pulmonary embolism Continue apixaban  5 mg twice daily  At risk for side effect of medication Lenvatinib  has potential side effects of fatigue, hypertension, edema, hand-foot syndrome, alopecia, skin rash, hyponatremia, hypothyroidism, weight loss, decreased appetite, abdominal pain, diarrhea, nausea, vomiting, mouth sores, mouth pain, increased liver enzyme, headaches, insomnia, musculoskeletal pain, cough, bleeding, and kidney toxicity, rarely heart failure, arrhythmia causing sudden cardiac death, blood clot, decreasing blood counts, encephalopathy, osteonecrosis of the jaw.  It is important to avoid dehydration, drinking plenty of fluid daily.  Report any new side effects or symptoms.   Monitor BP at home daily and in clinic after 1 week, then every 2 weeks for 2 months, and at least monthly thereafter.  LFTs (at baseline, every 2 weeks for 2 months, and at  least monthly thereafter) BMP, fT4, TSH levels at baseline and monthly or as clinically indicated monitor for proteinuria at baseline and periodically during treatment (urine dipstick; if 2+ then obtain a 24-hour urine protein).  Baseline ECG (10/15/22 Qtc 443) and Echo.  Repeat ECG on 11/26 showed Qtc 432 ms. ECG in select patients (congenital long QT syndrome, heart failure, bradyarrhythmias, or in those on concomitant medications known to prolong the QT interval).  Monitor for clinical signs/symptoms of cardiac dysfunction, arterial thrombosis, reversible posterior leukoencephalopathy syndrome (confirm with MRI), fistula formation, GI perforation, bleeding/hemorrhagic events, diarrhea, dehydration, and wound healing complications. Dental exam periodically during treatment.   Hypertension Continue telmisartan  to 40 mg daily Continue metoprolol  50 mg daily. Monitor BP daily at rest.  CKD (chronic kidney disease) Solitary kidney Monitor each cycle    The patient understands the plans discussed today and is in agreement with them.  He knows to contact our office if he develops concerns prior to his next appointment.  [name], MD  [facility] - A DEPT OF [facility] [address] [place] [address] Dept: [phone] Dept Fax: [phone]   No orders of the defined types were placed in this encounter.     CHIEF COMPLAINT:  CC: stage IV RCC  Current Treatment:  len/pembro  INTERVAL HISTORY:  Marcus Burns is here today for repeat clinical assessment. He denies fevers or chills.   BP at home <140 SBP. No headache.  No difficulty urinating.   No palpitation.  No edema, chest pain, short of breath, leg swelling, focal weakness, bleeding, diarrhea, and wound.  Dental issue:  none  I have reviewed the past medical history, past surgical history, social history and family history with the patient and they  are unchanged from previous note.  ALLERGIES:  is allergic to amlodipine  and doxycycline .  MEDICATIONS:  Current Outpatient Medications  Medication Sig Dispense Refill   acetaminophen  (TYLENOL ) 500 MG tablet Take 1 tablet (500 mg total) by mouth every 6 (six) hours as needed for moderate pain (pain.).     albuterol  (VENTOLIN  HFA) 108 (90 Base) MCG/ACT inhaler Inhale 2 puffs into the lungs every 6 (six) hours as needed for wheezing or shortness of breath. 8 g 1   apixaban  (ELIQUIS ) 5 MG TABS tablet Take 1 tablet (5 mg total) by mouth 2 (two) times daily. 60 tablet 6   cetirizine  (ZYRTEC ) 10 MG tablet Take 10 mg by mouth daily.     clonazePAM  (KLONOPIN ) 0.5 MG tablet Take 1 tablet (0.5 mg total) by mouth 2 (two) times daily as needed for anxiety. (Patient taking differently: Take 0.5 mg by mouth at bedtime.) 60 tablet 1   docusate sodium  (COLACE) 100 MG capsule Take 1 capsule (100 mg total) by mouth 2 (two) times daily. (Patient taking differently: Take 100 mg by mouth as needed for mild constipation or moderate constipation.)     iron  polysaccharides (NU-IRON ) 150 MG capsule Take 1 capsule (150 mg total) by mouth daily. (Patient taking differently: Take 150 mg by mouth every other day.) 90 capsule 1   lenvatinib  20 mg daily dose (LENVIMA ) 2 x 10 MG capsule Take 2 capsules (20 mg total) by mouth daily. 60 capsule 11   metoprolol  succinate (TOPROL -XL) 50 MG 24 hr tablet Take 1 tablet (50 mg total) by mouth daily. Take with or immediately following a meal. 90 tablet 3   ondansetron  (ZOFRAN ) 8 MG tablet Take 1 tablet (8 mg total) by mouth every 8 (eight) hours as needed for nausea or vomiting. 30 tablet 1   prochlorperazine  (COMPAZINE ) 10 MG tablet Take 1 tablet (10 mg total) by mouth every 6 (six) hours as needed for nausea or vomiting. 30 tablet 1   telmisartan  (MICARDIS ) 20 MG tablet TAKE 1 TABLET EVERY DAY 90 tablet 1   traZODone  (DESYREL ) 50 MG tablet Take 1 tablet (50 mg total) by mouth at  bedtime. 90 tablet 1   No current facility-administered medications for this visit.   Facility-Administered Medications Ordered in Other Visits  Medication Dose Route Frequency Provider Last Rate Last Admin   0.9 %  sodium chloride  infusion   Intravenous Once Tina Pauletta BROCKS, MD       pembrolizumab  (KEYTRUDA ) 200 mg in sodium chloride  0.9 % 50 mL chemo infusion  200 mg Intravenous Once Tina Pauletta BROCKS, MD        HISTORY OF PRESENT ILLNESS:   Oncology History  Clear cell carcinoma of kidney (HCC)  03/11/2022 Imaging   CTA Extensive right-sided pulmonary emboli with evidence of right heart strain.   Pleural based somewhat rounded areas of increased airspace opacity in the right lower lobe likely representing early changes of pulmonary infarct.   Changes consistent with the known history of right renal cell carcinoma.   05/10/2022 Imaging   CTA 1. Significantly decreased though not entirely resolved clot burden in the right lung with persistent small volume mural adherent clot. 2. Linear opacities in the right lower lobe may reflect atelectasis or developing scar. 3. Right upper pole renal mass again seen, incompletely imaged. 4. Cholelithiasis.   06/18/2022 Pathology Results  A. KIDNEY, RIGHT AND PERICAVAL LYMPH NODES, RADICAL NEPHRECTOMY:       Clear cell renal cell carcinoma, WHO / ISUP grade 3 (of 4).       Tumor size: 14.5 x 12 x 8.5 cm.       Tumor necrosis identified accounting for approximately 15% of  parenchyma.      Carcinoma disrupts capsule and invades into perinephric tissue,  adrenal parenchyma, renal vein and renal sinus.       Renal vein margin is positive for carcinoma.  Ureteral and vascular margins are negative for carcinoma.  Separated tumor satellite focus (0.5 cm).  Background renal parenchyma with focal interstitial inflammation without  glomerulosclerosis, tubal atrophy or vasculopathy.  See oncology table.   Procedure: Nephrectomy  Specimen  Laterality: Right  Tumor Size: 14.5 x 12 x 8.5 cm  Tumor Focality: One focus of mail tumor (14.5 cm) with one fucus of  tumor satellite nodule (0.5 cm)  Histologic Type: Clear cell renal cell carcinoma  Sarcomatoid Features: Not identified  Rhabdoid Features: Not identified  Histologic Grade: Grade 3  Tumor Necrosis: present, 15% of the parenchymal volume  Tumor Extension: and invades into perinephric tissue, adrenal  parenchyma, renal vein and renal sinus.  Lymphatic and/or Vascular Invasion:  Not identified  Margins: Renal vein margin is positive for tumor  Regional Lymph Nodes:       Number of Lymph Nodes with Tumor: 0            Number of Lymph Nodes Examined: 1  Distant Metastasis:       Distant Site(s) Involved: Not applicable  Additional Findings in Nonneoplastic Kidney: Focal interstitial  inflammation without glomerulosclerosis, tubal atrophy or vasculopathy  Pathologic Stage Classification (pTNM, AJCC 8th Edition): pT4, pN0    07/16/2022 Initial Diagnosis   Clear cell carcinoma of kidney (HCC)   08/11/2022 -  Chemotherapy   Received first dose of pembrolizumab  Patient is on Treatment Plan : RENAL Pembrolizumab  (200) q21d      10/12/2022 Imaging   Presented with right sided flank pain.  CT AP 1. Status post right nephrectomy and right adrenalectomy. Poorly defined soft tissue density in the expected location of right renal fossa with extensive ill-defined nodular soft tissue thickening in the right retroperitoneum, concerning for residual or recurrent tumor and metastatic disease. 2. Enlarged liver with multiple hypodense areas suspicious for metastatic disease. Suspect large metastatic mass involving the caudate lobe of liver with more focal hypodensity in the vicinity of the IVC, probably related to tumor thrombus. 3. Triangular shaped gas and fluid collection either within the right hepatic lobe or abutting its margin given appearance on postoperative CT from June.  This area measures roughly 5.4 by 4.5 cm and is indeterminate for necrotic tumor or infected fluid collection. 4. Small volume abdominopelvic ascites, new compared to prior exam. 5. Extensive right-sided predominant peritoneal metastatic disease. Small to moderate volume of pelvic ascites. 6. Interim finding of the new 4 mm left lung base pulmonary nodule, suspicious for a metastatic nodule. 7. Small right-sided pleural effusion with right middle lobe and right lower lobe atelectasis. 8. Gallstones. Nonobstructing left kidney stones.   10/14/2022 -  Hospital Admission   Seen while inpatient findings of metastatic disease.    10/15/2022 -  Chemotherapy   Patient is on Treatment Plan : Lenvatinib  20 mg daily RENAL Pembrolizumab  (200) q21d         REVIEW OF SYSTEMS:   All relevant systems were reviewed with the patient  and are negative.   VITALS:  Blood pressure 134/89, pulse 79, temperature (!) 97.5 F (36.4 C), temperature source Temporal, resp. rate 17, weight 300 lb (136.1 kg), SpO2 98%.  Wt Readings from Last 3 Encounters:  01/14/23 300 lb (136.1 kg)  12/24/22 297 lb 3.2 oz (134.8 kg)  11/30/22 290 lb 12.8 oz (131.9 kg)    Body mass index is 36.52 kg/m.  Performance status (ECOG): 0 - Asymptomatic  PHYSICAL EXAM:   GENERAL:alert, no distress and comfortable SKIN: skin color normal, no rashes  EYES: normal, sclera clear OROPHARYNX: no exudate, no erythema    NECK: supple,  non-tender, without nodularity LYMPH:  no palpable cervical lymphadenopathy LUNGS: clear to auscultation with normal breathing effort.  No wheeze or rales HEART: regular rate & rhythm and no murmurs and no lower extremity edema ABDOMEN: abdomen soft, non-tender and nondistended Musculoskeletal: no edema NEURO: alert, fluent speech, no focal motor/sensory deficits.  Strength and sensation equal bilaterally.  LABORATORY DATA:  I have reviewed the data as listed    Component Value Date/Time    NA 138 01/14/2023 1205   K 4.3 01/14/2023 1205   CL 104 01/14/2023 1205   CO2 29 01/14/2023 1205   GLUCOSE 80 01/14/2023 1205   BUN 17 01/14/2023 1205   CREATININE 1.42 (H) 01/14/2023 1205   CALCIUM 9.4 01/14/2023 1205   PROT 7.5 01/14/2023 1205   ALBUMIN  3.8 01/14/2023 1205   AST 62 (H) 01/14/2023 1205   ALT 17 01/14/2023 1205   ALKPHOS 103 01/14/2023 1205   BILITOT 0.8 01/14/2023 1205   GFRNONAA >60 01/14/2023 1205   GFRAA >60 02/23/2019 1406    No results found for: SPEP, UPEP  Lab Results  Component Value Date   WBC 7.2 01/14/2023   NEUTROABS 4.0 01/14/2023   HGB 11.5 (L) 01/14/2023   HCT 36.1 (L) 01/14/2023   MCV 85.1 01/14/2023   PLT 170 01/14/2023      Chemistry      Component Value Date/Time   NA 138 01/14/2023 1205   K 4.3 01/14/2023 1205   CL 104 01/14/2023 1205   CO2 29 01/14/2023 1205   BUN 17 01/14/2023 1205   CREATININE 1.42 (H) 01/14/2023 1205      Component Value Date/Time   CALCIUM 9.4 01/14/2023 1205   ALKPHOS 103 01/14/2023 1205   AST 62 (H) 01/14/2023 1205   ALT 17 01/14/2023 1205   BILITOT 0.8 01/14/2023 1205       RADIOGRAPHIC STUDIES: I have personally reviewed the radiological images as listed and agreed with the findings in the report. No results found.

## 2023-01-14 ENCOUNTER — Inpatient Hospital Stay: Payer: BC Managed Care – PPO | Attending: Hematology and Oncology

## 2023-01-14 ENCOUNTER — Inpatient Hospital Stay: Payer: BC Managed Care – PPO

## 2023-01-14 ENCOUNTER — Inpatient Hospital Stay (HOSPITAL_BASED_OUTPATIENT_CLINIC_OR_DEPARTMENT_OTHER): Payer: BC Managed Care – PPO

## 2023-01-14 VITALS — BP 134/89 | HR 79 | Temp 97.5°F | Resp 17 | Wt 300.0 lb

## 2023-01-14 DIAGNOSIS — C641 Malignant neoplasm of right kidney, except renal pelvis: Secondary | ICD-10-CM

## 2023-01-14 DIAGNOSIS — N189 Chronic kidney disease, unspecified: Secondary | ICD-10-CM | POA: Insufficient documentation

## 2023-01-14 DIAGNOSIS — I2699 Other pulmonary embolism without acute cor pulmonale: Secondary | ICD-10-CM

## 2023-01-14 DIAGNOSIS — C787 Secondary malignant neoplasm of liver and intrahepatic bile duct: Secondary | ICD-10-CM | POA: Insufficient documentation

## 2023-01-14 DIAGNOSIS — J90 Pleural effusion, not elsewhere classified: Secondary | ICD-10-CM | POA: Diagnosis not present

## 2023-01-14 DIAGNOSIS — C786 Secondary malignant neoplasm of retroperitoneum and peritoneum: Secondary | ICD-10-CM | POA: Diagnosis not present

## 2023-01-14 DIAGNOSIS — Z5112 Encounter for antineoplastic immunotherapy: Secondary | ICD-10-CM | POA: Insufficient documentation

## 2023-01-14 DIAGNOSIS — I159 Secondary hypertension, unspecified: Secondary | ICD-10-CM

## 2023-01-14 DIAGNOSIS — Z9189 Other specified personal risk factors, not elsewhere classified: Secondary | ICD-10-CM

## 2023-01-14 DIAGNOSIS — Z79620 Long term (current) use of immunosuppressive biologic: Secondary | ICD-10-CM | POA: Diagnosis not present

## 2023-01-14 DIAGNOSIS — Z86711 Personal history of pulmonary embolism: Secondary | ICD-10-CM | POA: Diagnosis not present

## 2023-01-14 DIAGNOSIS — N1831 Chronic kidney disease, stage 3a: Secondary | ICD-10-CM

## 2023-01-14 DIAGNOSIS — I10 Essential (primary) hypertension: Secondary | ICD-10-CM | POA: Insufficient documentation

## 2023-01-14 LAB — CMP (CANCER CENTER ONLY)
ALT: 17 U/L (ref 0–44)
AST: 62 U/L — ABNORMAL HIGH (ref 15–41)
Albumin: 3.8 g/dL (ref 3.5–5.0)
Alkaline Phosphatase: 103 U/L (ref 38–126)
Anion gap: 5 (ref 5–15)
BUN: 17 mg/dL (ref 6–20)
CO2: 29 mmol/L (ref 22–32)
Calcium: 9.4 mg/dL (ref 8.9–10.3)
Chloride: 104 mmol/L (ref 98–111)
Creatinine: 1.42 mg/dL — ABNORMAL HIGH (ref 0.61–1.24)
GFR, Estimated: >60 mL/min (ref >60)
Glucose, Bld: 80 mg/dL (ref 70–99)
Potassium: 4.3 mmol/L (ref 3.5–5.1)
Sodium: 138 mmol/L (ref 135–145)
Total Bilirubin: 0.8 mg/dL (ref 0.0–1.2)
Total Protein: 7.5 g/dL (ref 6.5–8.1)

## 2023-01-14 LAB — CBC WITH DIFFERENTIAL (CANCER CENTER ONLY)
Abs Immature Granulocytes: 0.02 10*3/uL (ref 0.00–0.07)
Basophils Absolute: 0 10*3/uL (ref 0.0–0.1)
Basophils Relative: 0 %
Eosinophils Absolute: 1.2 10*3/uL — ABNORMAL HIGH (ref 0.0–0.5)
Eosinophils Relative: 16 %
HCT: 36.1 % — ABNORMAL LOW (ref 39.0–52.0)
Hemoglobin: 11.5 g/dL — ABNORMAL LOW (ref 13.0–17.0)
Immature Granulocytes: 0 %
Lymphocytes Relative: 19 %
Lymphs Abs: 1.4 10*3/uL (ref 0.7–4.0)
MCH: 27.1 pg (ref 26.0–34.0)
MCHC: 31.9 g/dL (ref 30.0–36.0)
MCV: 85.1 fL (ref 80.0–100.0)
Monocytes Absolute: 0.5 10*3/uL (ref 0.1–1.0)
Monocytes Relative: 7 %
Neutro Abs: 4 10*3/uL (ref 1.7–7.7)
Neutrophils Relative %: 58 %
Platelet Count: 170 10*3/uL (ref 150–400)
RBC: 4.24 MIL/uL (ref 4.22–5.81)
RDW: 18 % — ABNORMAL HIGH (ref 11.5–15.5)
WBC Count: 7.2 10*3/uL (ref 4.0–10.5)
nRBC: 0 % (ref 0.0–0.2)

## 2023-01-14 LAB — T4, FREE: Free T4: 1.05 ng/dL (ref 0.61–1.12)

## 2023-01-14 LAB — TSH: TSH: 6.067 u[IU]/mL — ABNORMAL HIGH (ref 0.350–4.500)

## 2023-01-14 MED ORDER — SODIUM CHLORIDE 0.9 % IV SOLN
200.0000 mg | Freq: Once | INTRAVENOUS | Status: AC
  Administered 2023-01-14: 200 mg via INTRAVENOUS
  Filled 2023-01-14: qty 200

## 2023-01-14 MED ORDER — SODIUM CHLORIDE 0.9 % IV SOLN: Freq: Once | INTRAVENOUS | Status: AC

## 2023-01-14 NOTE — Patient Instructions (Signed)

## 2023-01-15 ENCOUNTER — Other Ambulatory Visit: Payer: Self-pay

## 2023-01-19 ENCOUNTER — Other Ambulatory Visit: Payer: Self-pay

## 2023-01-28 ENCOUNTER — Ambulatory Visit: Payer: BC Managed Care – PPO

## 2023-01-28 ENCOUNTER — Telehealth: Payer: Self-pay | Admitting: Genetic Counselor

## 2023-01-28 ENCOUNTER — Other Ambulatory Visit: Payer: BC Managed Care – PPO

## 2023-01-28 ENCOUNTER — Encounter: Payer: 59 | Admitting: Internal Medicine

## 2023-01-28 NOTE — Telephone Encounter (Signed)
Patient was called to confirm genetic counseling appointment on 02/01/2023 9am- he was glad we called and wanted to reschedule. He prefers a Thursday PM time. Next available he was scheduled for April 14 2023 1pm. On 03/18/2023, he has an appt for lab work-added in note to draw for Genetics. Thank you

## 2023-01-29 ENCOUNTER — Other Ambulatory Visit: Payer: Self-pay

## 2023-02-01 ENCOUNTER — Ambulatory Visit: Payer: BC Managed Care – PPO

## 2023-02-01 ENCOUNTER — Encounter: Payer: BC Managed Care – PPO | Admitting: Genetic Counselor

## 2023-02-01 ENCOUNTER — Other Ambulatory Visit: Payer: BC Managed Care – PPO

## 2023-02-02 ENCOUNTER — Ambulatory Visit (HOSPITAL_COMMUNITY)
Admission: RE | Admit: 2023-02-02 | Discharge: 2023-02-02 | Disposition: A | Discharge status: Home / Self Care | Payer: BC Managed Care – PPO | Source: Ambulatory Visit

## 2023-02-02 DIAGNOSIS — C641 Malignant neoplasm of right kidney, except renal pelvis: Secondary | ICD-10-CM | POA: Diagnosis present

## 2023-02-02 MED ORDER — IOHEXOL 300 MG/ML  SOLN
100.0000 mL | Freq: Once | INTRAMUSCULAR | Status: AC | PRN
  Administered 2023-02-02: 100 mL via INTRAVENOUS

## 2023-02-03 NOTE — Progress Notes (Signed)
Patient Care Team: Corwin Levins, MD as PCP - General  Clinic Day:  02/04/2023  Referring physician: Rachel Moulds, MD  ASSESSMENT & PLAN:   Assessment & Plan: Marcus Burns is a 43 year old pleasant male with history of right renal cell carcinoma with metastatic disease here for follow up.  Imaging showed peritoneal carcinomatosis, pulmonary nodules, liver metastases.    Overall tolerating well. Clinically with response. Hypertension from TKI uncontrolled. Will increase dose of telmisartan today. ECG done in the room today to review QTc. Will need to monitor thyroid function.   Diagnosis: mRCC Treatment:  10/15/2022 lenvatinib. 20 mg daily with pembrolizumab   He is tolerating treatment well.  Will continue.  Restaging planned for end of January.   Clear cell carcinoma of kidney (HCC) 10/15/22 started Lenvatinib 20 mg once daily Continue pembro every 3 weeks         10/28/22 MRI of brian. results negative for brain metastases Referral made for genetic testing Monitor for new symptoms Repeat CT in Jan 2025. Ordered today  Hypertension Continue telmisartan to 40 mg daily Continue metoprolol 50 mg daily. Monitor BP daily at rest.  At risk for side effect of medication Monitor BP at home daily and in clinic after 1 week, then every 2 weeks for 2 months, and at least monthly thereafter.  LFTs (at baseline, every 2 weeks for 2 months, and at least monthly thereafter) BMP, fT4, TSH levels at baseline and monthly or as clinically indicated monitor for proteinuria at baseline and periodically during treatment (urine dipstick; if 2+ then obtain a 24-hour urine protein).  Baseline ECG (10/15/22 Qtc 443) and Echo.  Repeat ECG on 11/26 showed Qtc 432 ms. ECG in select patients (congenital long QT syndrome, heart failure, bradyarrhythmias, or in those on concomitant medications known to prolong the QT interval).  Monitor for clinical signs/symptoms of cardiac dysfunction, arterial thrombosis,  reversible posterior leukoencephalopathy syndrome (confirm with MRI), fistula formation, GI perforation, bleeding/hemorrhagic events, diarrhea, dehydration, and wound healing complications. Dental exam periodically during treatment.   CKD (chronic kidney disease) Solitary kidney Stable  Monitor each cycle    The patient understands the plans discussed today and is in agreement with them.  He knows to contact our office if he develops concerns prior to his next appointment.  [name], MD  [facility] - A DEPT OF [facility] [address] [place] [address] Dept: [phone] Dept Fax: [phone]   No orders of the defined types were placed in this encounter.     CHIEF COMPLAINT:  CC: mRCC  Current Treatment:  len/pembro  INTERVAL HISTORY:  Woodie is here today for repeat clinical assessment. He denies fevers or chills.  No pain.  BP at home <140 SBP mostly  LFTs improved since started treatment. No swelling, stomach pain, nausea or vomiting.  No difficulty urinating. He drinks a lot of fluid.  No palpitation, chest pain.  No edema, coughing, short of breath, leg swelling, headache, focal weakness, bleeding, diarrhea, and wound.  Dental issue: none. Teeth healthy.  I have reviewed the past medical history, past surgical history, social history and family history with the patient and they are unchanged from previous note.  ALLERGIES:  is allergic to amlodipine and doxycycline.  MEDICATIONS:  Current Outpatient Medications  Medication Sig Dispense Refill   acetaminophen (TYLENOL) 500 MG tablet Take 1 tablet (500 mg total) by mouth every 6 (six) hours as needed  for moderate pain (pain.).     albuterol (VENTOLIN HFA) 108 (90 Base) MCG/ACT inhaler Inhale 2 puffs into the lungs every 6 (six) hours as needed for wheezing or shortness of breath. 8 g 1   apixaban (ELIQUIS) 5 MG TABS tablet Take 1  tablet (5 mg total) by mouth 2 (two) times daily. 60 tablet 6   cetirizine (ZYRTEC) 10 MG tablet Take 10 mg by mouth daily.     clonazePAM (KLONOPIN) 0.5 MG tablet Take 1 tablet (0.5 mg total) by mouth 2 (two) times daily as needed for anxiety. (Patient taking differently: Take 0.5 mg by mouth at bedtime.) 60 tablet 1   docusate sodium (COLACE) 100 MG capsule Take 1 capsule (100 mg total) by mouth 2 (two) times daily. (Patient taking differently: Take 100 mg by mouth as needed for mild constipation or moderate constipation.)     iron polysaccharides (NU-IRON) 150 MG capsule Take 1 capsule (150 mg total) by mouth daily. (Patient taking differently: Take 150 mg by mouth every other day.) 90 capsule 1   lenvatinib 20 mg daily dose (LENVIMA) 2 x 10 MG capsule Take 2 capsules (20 mg total) by mouth daily. 60 capsule 11   metoprolol succinate (TOPROL-XL) 50 MG 24 hr tablet Take 1 tablet (50 mg total) by mouth daily. Take with or immediately following a meal. 90 tablet 3   ondansetron (ZOFRAN) 8 MG tablet Take 1 tablet (8 mg total) by mouth every 8 (eight) hours as needed for nausea or vomiting. 30 tablet 1   prochlorperazine (COMPAZINE) 10 MG tablet Take 1 tablet (10 mg total) by mouth every 6 (six) hours as needed for nausea or vomiting. 30 tablet 1   telmisartan (MICARDIS) 20 MG tablet TAKE 1 TABLET EVERY DAY 90 tablet 1   traZODone (DESYREL) 50 MG tablet Take 1 tablet (50 mg total) by mouth at bedtime. 90 tablet 1   No current facility-administered medications for this visit.    HISTORY OF PRESENT ILLNESS:   Oncology History  Clear cell carcinoma of kidney (HCC)  03/11/2022 Imaging   CTA Extensive right-sided pulmonary emboli with evidence of right heart strain.   Pleural based somewhat rounded areas of increased airspace opacity in the right lower lobe likely representing early changes of pulmonary infarct.   Changes consistent with the known history of right renal cell carcinoma.    05/10/2022 Imaging   CTA 1. Significantly decreased though not entirely resolved clot burden in the right lung with persistent small volume mural adherent clot. 2. Linear opacities in the right lower lobe may reflect atelectasis or developing scar. 3. Right upper pole renal mass again seen, incompletely imaged. 4. Cholelithiasis.   06/18/2022 Pathology Results   A. KIDNEY, RIGHT AND PERICAVAL LYMPH NODES, RADICAL NEPHRECTOMY:       Clear cell renal cell carcinoma, WHO / ISUP grade 3 (of 4).       Tumor size: 14.5 x 12 x 8.5 cm.       Tumor necrosis identified accounting for approximately 15% of  parenchyma.      Carcinoma disrupts capsule and invades into perinephric tissue,  adrenal parenchyma, renal vein and renal sinus.       Renal vein margin is positive for carcinoma.  Ureteral and vascular margins are negative for carcinoma.  Separated tumor satellite focus (0.5 cm).  Background renal parenchyma with focal interstitial inflammation without  glomerulosclerosis, tubal atrophy or vasculopathy.  See oncology table.   Procedure: Nephrectomy  Specimen Laterality: Right  Tumor Size: 14.5 x 12 x 8.5 cm  Tumor Focality: One focus of mail tumor (14.5 cm) with one fucus of  tumor satellite nodule (0.5 cm)  Histologic Type: Clear cell renal cell carcinoma  Sarcomatoid Features: Not identified  Rhabdoid Features: Not identified  Histologic Grade: Grade 3  Tumor Necrosis: present, 15% of the parenchymal volume  Tumor Extension: and invades into perinephric tissue, adrenal  parenchyma, renal vein and renal sinus.  Lymphatic and/or Vascular Invasion:  Not identified  Margins: Renal vein margin is positive for tumor  Regional Lymph Nodes:       Number of Lymph Nodes with Tumor: 0            Number of Lymph Nodes Examined: 1  Distant Metastasis:       Distant Site(s) Involved: Not applicable  Additional Findings in Nonneoplastic Kidney: Focal interstitial  inflammation without  glomerulosclerosis, tubal atrophy or vasculopathy  Pathologic Stage Classification (pTNM, AJCC 8th Edition): pT4, pN0    07/16/2022 Initial Diagnosis   Clear cell carcinoma of kidney (HCC)   08/11/2022 -  Chemotherapy   Received first dose of pembrolizumab Patient is on Treatment Plan : RENAL Pembrolizumab (200) q21d      10/12/2022 Imaging   Presented with right sided flank pain.  CT AP 1. Status post right nephrectomy and right adrenalectomy. Poorly defined soft tissue density in the expected location of right renal fossa with extensive ill-defined nodular soft tissue thickening in the right retroperitoneum, concerning for residual or recurrent tumor and metastatic disease. 2. Enlarged liver with multiple hypodense areas suspicious for metastatic disease. Suspect large metastatic mass involving the caudate lobe of liver with more focal hypodensity in the vicinity of the IVC, probably related to tumor thrombus. 3. Triangular shaped gas and fluid collection either within the right hepatic lobe or abutting its margin given appearance on postoperative CT from June. This area measures roughly 5.4 by 4.5 cm and is indeterminate for necrotic tumor or infected fluid collection. 4. Small volume abdominopelvic ascites, new compared to prior exam. 5. Extensive right-sided predominant peritoneal metastatic disease. Small to moderate volume of pelvic ascites. 6. Interim finding of the new 4 mm left lung base pulmonary nodule, suspicious for a metastatic nodule. 7. Small right-sided pleural effusion with right middle lobe and right lower lobe atelectasis. 8. Gallstones. Nonobstructing left kidney stones.   10/14/2022 -  Hospital Admission   Seen while inpatient findings of metastatic disease.    10/15/2022 -  Chemotherapy   Patient is on Treatment Plan : Lenvatinib 20 mg daily RENAL Pembrolizumab (200) q21d     01/18/2023 Cancer Staging   Staging form: Kidney, AJCC 8th Edition -  Clinical: Stage IV (cT4, cN0, cM1) - Signed by [name], MD on 01/18/2023 Histologic grade (G): G3 Histologic grading system: 4 grade system       REVIEW OF SYSTEMS:   All relevant systems were reviewed with the patient and are negative.   VITALS:  Blood pressure (!) 149/94, pulse 80, temperature 97.7 F (36.5 C), temperature source Temporal, resp. rate 16, weight 294 lb 9.6 oz (133.6 kg), SpO2 100%.  Wt Readings from Last 3 Encounters:  02/04/23 294 lb 9.6 oz (133.6 kg)  01/14/23 300 lb (136.1 kg)  12/24/22 297 lb 3.2 oz (134.8 kg)    Body mass index is 35.86 kg/m.  Performance status (ECOG): 0 - Asymptomatic  PHYSICAL EXAM:   GENERAL:alert, no distress and comfortable SKIN: skin color normal, no rashes  EYES:  normal, sclera clear OROPHARYNX: no exudate, no erythema    NECK: supple,  non-tender, without nodularity LYMPH:  no palpable cervical lymphadenopathy LUNGS: clear to auscultation with normal breathing effort.  No wheeze or rales HEART: regular rate & rhythm and no murmurs and no lower extremity edema ABDOMEN: abdomen soft, non-tender and nondistended Musculoskeletal: no edema NEURO: alert, fluent speech, no focal motor/sensory deficits.  Strength and sensation equal bilaterally.  LABORATORY DATA:  I have reviewed the data as listed    Component Value Date/Time   NA 141 02/04/2023 0911   K 4.2 02/04/2023 0911   CL 109 02/04/2023 0911   CO2 26 02/04/2023 0911   GLUCOSE 89 02/04/2023 0911   BUN 15 02/04/2023 0911   CREATININE 1.35 (H) 02/04/2023 0911   CALCIUM 9.3 02/04/2023 0911   PROT 7.4 02/04/2023 0911   ALBUMIN 3.9 02/04/2023 0911   AST 68 (H) 02/04/2023 0911   ALT 20 02/04/2023 0911   ALKPHOS 100 02/04/2023 0911   BILITOT 0.4 02/04/2023 0911   GFRNONAA >60 02/04/2023 0911   GFRAA >60 02/23/2019 1406    No results found for: "SPEP", "UPEP"  Lab Results  Component Value Date   WBC 5.6 02/04/2023   NEUTROABS 2.8 02/04/2023   HGB 11.7  (L) 02/04/2023   HCT 37.5 (L) 02/04/2023   MCV 87.6 02/04/2023   PLT 247 02/04/2023      Chemistry      Component Value Date/Time   NA 141 02/04/2023 0911   K 4.2 02/04/2023 0911   CL 109 02/04/2023 0911   CO2 26 02/04/2023 0911   BUN 15 02/04/2023 0911   CREATININE 1.35 (H) 02/04/2023 0911      Component Value Date/Time   CALCIUM 9.3 02/04/2023 0911   ALKPHOS 100 02/04/2023 0911   AST 68 (H) 02/04/2023 0911   ALT 20 02/04/2023 0911   BILITOT 0.4 02/04/2023 0911       RADIOGRAPHIC STUDIES: I have personally reviewed the radiological images as listed and agreed with the findings in the report. No results found.

## 2023-02-03 NOTE — Assessment & Plan Note (Signed)
10/15/22 started Lenvatinib 20 mg once daily Continue pembro every 3 weeks         10/28/22 MRI of brian. results negative for brain metastases Referral made for genetic testing Monitor for new symptoms Repeat CT in Jan 2025. Ordered today

## 2023-02-04 ENCOUNTER — Inpatient Hospital Stay: Payer: BC Managed Care – PPO

## 2023-02-04 ENCOUNTER — Inpatient Hospital Stay (HOSPITAL_BASED_OUTPATIENT_CLINIC_OR_DEPARTMENT_OTHER): Payer: BC Managed Care – PPO

## 2023-02-04 VITALS — BP 125/85 | HR 62 | Resp 16

## 2023-02-04 VITALS — BP 149/94 | HR 80 | Temp 97.7°F | Resp 16 | Wt 294.6 lb

## 2023-02-04 DIAGNOSIS — Z9189 Other specified personal risk factors, not elsewhere classified: Secondary | ICD-10-CM | POA: Diagnosis not present

## 2023-02-04 DIAGNOSIS — N1831 Chronic kidney disease, stage 3a: Secondary | ICD-10-CM | POA: Diagnosis not present

## 2023-02-04 DIAGNOSIS — C641 Malignant neoplasm of right kidney, except renal pelvis: Secondary | ICD-10-CM | POA: Diagnosis not present

## 2023-02-04 DIAGNOSIS — I2699 Other pulmonary embolism without acute cor pulmonale: Secondary | ICD-10-CM

## 2023-02-04 DIAGNOSIS — Z5112 Encounter for antineoplastic immunotherapy: Secondary | ICD-10-CM | POA: Diagnosis not present

## 2023-02-04 DIAGNOSIS — I159 Secondary hypertension, unspecified: Secondary | ICD-10-CM | POA: Diagnosis not present

## 2023-02-04 LAB — CBC WITH DIFFERENTIAL (CANCER CENTER ONLY)
Abs Immature Granulocytes: 0.01 10*3/uL (ref 0.00–0.07)
Basophils Absolute: 0 10*3/uL (ref 0.0–0.1)
Basophils Relative: 1 %
Eosinophils Absolute: 1.2 10*3/uL — ABNORMAL HIGH (ref 0.0–0.5)
Eosinophils Relative: 22 %
HCT: 37.5 % — ABNORMAL LOW (ref 39.0–52.0)
Hemoglobin: 11.7 g/dL — ABNORMAL LOW (ref 13.0–17.0)
Immature Granulocytes: 0 %
Lymphocytes Relative: 22 %
Lymphs Abs: 1.2 10*3/uL (ref 0.7–4.0)
MCH: 27.3 pg (ref 26.0–34.0)
MCHC: 31.2 g/dL (ref 30.0–36.0)
MCV: 87.6 fL (ref 80.0–100.0)
Monocytes Absolute: 0.4 10*3/uL (ref 0.1–1.0)
Monocytes Relative: 6 %
Neutro Abs: 2.8 10*3/uL (ref 1.7–7.7)
Neutrophils Relative %: 49 %
Platelet Count: 247 10*3/uL (ref 150–400)
RBC: 4.28 MIL/uL (ref 4.22–5.81)
RDW: 18.6 % — ABNORMAL HIGH (ref 11.5–15.5)
WBC Count: 5.6 10*3/uL (ref 4.0–10.5)
nRBC: 0 % (ref 0.0–0.2)

## 2023-02-04 LAB — CMP (CANCER CENTER ONLY)
ALT: 20 U/L (ref 0–44)
AST: 68 U/L — ABNORMAL HIGH (ref 15–41)
Albumin: 3.9 g/dL (ref 3.5–5.0)
Alkaline Phosphatase: 100 U/L (ref 38–126)
Anion gap: 6 (ref 5–15)
BUN: 15 mg/dL (ref 6–20)
CO2: 26 mmol/L (ref 22–32)
Calcium: 9.3 mg/dL (ref 8.9–10.3)
Chloride: 109 mmol/L (ref 98–111)
Creatinine: 1.35 mg/dL — ABNORMAL HIGH (ref 0.61–1.24)
GFR, Estimated: >60 mL/min (ref >60)
Glucose, Bld: 89 mg/dL (ref 70–99)
Potassium: 4.2 mmol/L (ref 3.5–5.1)
Sodium: 141 mmol/L (ref 135–145)
Total Bilirubin: 0.4 mg/dL (ref 0.0–1.2)
Total Protein: 7.4 g/dL (ref 6.5–8.1)

## 2023-02-04 LAB — TSH: TSH: 6.904 u[IU]/mL — ABNORMAL HIGH (ref 0.350–4.500)

## 2023-02-04 LAB — T4, FREE: Free T4: 1.03 ng/dL (ref 0.61–1.12)

## 2023-02-04 MED ORDER — SODIUM CHLORIDE 0.9 % IV SOLN: Freq: Once | INTRAVENOUS | Status: AC

## 2023-02-04 MED ORDER — SODIUM CHLORIDE 0.9 % IV SOLN
200.0000 mg | Freq: Once | INTRAVENOUS | Status: AC
  Administered 2023-02-04: 200 mg via INTRAVENOUS
  Filled 2023-02-04: qty 200

## 2023-02-04 NOTE — Patient Instructions (Signed)
 [facility] - A [facility]PT OF [facility]  Discharge Instructions: Thank you for choosing [facility] to provide your oncology and hematology care.   If you have a lab appointment with the Cancer Center, please go directly to the Cancer Center and check in at the registration area.   Wear comfortable clothing and clothing appropriate for easy access to any Portacath or PICC line.   We strive to give you quality time with your provider. You may need to reschedule your appointment if you arrive late (15 or more minutes).  Arriving late affects you and other patients whose appointments are after yours.  Also, if you miss three or more appointments without notifying the office, you may be dismissed from the clinic at the provider's discretion.      For prescription refill requests, have your pharmacy contact our office and allow 72 hours for refills to be completed.    Today you received the following chemotherapy and/or immunotherapy agents: Keytruda      To help prevent nausea and vomiting after your treatment, we encourage you to take your nausea medication as directed.  BELOW ARE SYMPTOMS THAT SHOULD BE REPORTED IMMEDIATELY: *FEVER GREATER THAN 100.4 F (38 C) OR HIGHER *CHILLS OR SWEATING *NAUSEA AND VOMITING THAT IS NOT CONTROLLED WITH YOUR NAUSEA MEDICATION *UNUSUAL SHORTNESS OF BREATH *UNUSUAL BRUISING OR BLEEDING *URINARY PROBLEMS (pain or burning when urinating, or frequent urination) *BOWEL PROBLEMS (unusual diarrhea, constipation, pain near the anus) TENDERNESS IN MOUTH AND THROAT WITH OR WITHOUT PRESENCE OF ULCERS (sore throat, sores in mouth, or a toothache) UNUSUAL RASH, SWELLING OR PAIN  UNUSUAL VAGINAL DISCHARGE OR ITCHING   Items with * indicate a potential emergency and should be followed up as soon as possible or go to the Emergency Department if any problems should occur.  Please show the CHEMOTHERAPY ALERT CARD or IMMUNOTHERAPY  ALERT CARD at check-in to the Emergency Department and triage nurse.  Should you have questions after your visit or need to cancel or reschedule your appointment, please contact [facility] - A DEPT OF [facility]  Dept: [phone]  and follow the prompts.  Office hours are 8:00 a.m. to 4:30 p.m. Monday - Friday. Please note that voicemails left after 4:00 p.m. may not be returned until the following business day.  We are closed weekends and major holidays. You have access to a nurse at all times for urgent questions. Please call the main number to the clinic Dept: [phone] and follow the prompts.   For any non-urgent questions, you may also contact your provider using MyChart. We now offer e-Visits for anyone 107 and older to request care online for non-urgent symptoms. For details visit [email or web address].   Also download the MyChart app! Go to the app store, search "MyChart", open the app, select [facility], and log in with your MyChart username and password.

## 2023-02-04 NOTE — Assessment & Plan Note (Signed)
Continue telmisartan to 40 mg daily Continue metoprolol 50 mg daily. Monitor BP daily at rest.

## 2023-02-04 NOTE — Assessment & Plan Note (Signed)
Solitary kidney Stable  Monitor each cycle

## 2023-02-04 NOTE — Assessment & Plan Note (Signed)
Monitor BP at home daily and in clinic after 1 week, then every 2 weeks for 2 months, and at least monthly thereafter.  LFTs (at baseline, every 2 weeks for 2 months, and at least monthly thereafter) BMP, fT4, TSH levels at baseline and monthly or as clinically indicated monitor for proteinuria at baseline and periodically during treatment (urine dipstick; if 2+ then obtain a 24-hour urine protein).  Baseline ECG (10/15/22 Qtc 443) and Echo.  Repeat ECG on 11/26 showed Qtc 432 ms. ECG in select patients (congenital long QT syndrome, heart failure, bradyarrhythmias, or in those on concomitant medications known to prolong the QT interval).  Monitor for clinical signs/symptoms of cardiac dysfunction, arterial thrombosis, reversible posterior leukoencephalopathy syndrome (confirm with MRI), fistula formation, GI perforation, bleeding/hemorrhagic events, diarrhea, dehydration, and wound healing complications. Dental exam periodically during treatment.

## 2023-02-07 ENCOUNTER — Telehealth: Payer: Self-pay

## 2023-02-07 NOTE — Telephone Encounter (Signed)
TC from [place] at [employer], requesting ICD code for [facility] Rx coverage. He states he needs this in order to see if pt qualifies for financial assistance for this medication. ICD code C64.9 given.

## 2023-02-09 ENCOUNTER — Encounter: Payer: 59 | Admitting: Internal Medicine

## 2023-02-15 ENCOUNTER — Other Ambulatory Visit: Payer: Self-pay

## 2023-02-15 MED ORDER — ZOLPIDEM TARTRATE 10 MG PO TABS: 10.0000 mg | ORAL_TABLET | Freq: Every evening | ORAL | 0 refills | Status: DC | PRN

## 2023-02-15 NOTE — Progress Notes (Signed)
Ambien prescribed per request. Discontinued trazodone.

## 2023-02-22 ENCOUNTER — Ambulatory Visit: Payer: BC Managed Care – PPO

## 2023-02-22 ENCOUNTER — Other Ambulatory Visit: Payer: BC Managed Care – PPO

## 2023-02-24 NOTE — Assessment & Plan Note (Signed)
 Continue apixaban 5 mg twice daily

## 2023-02-24 NOTE — Assessment & Plan Note (Signed)
Continue on eliquis anticoagulation Bleeding precautions

## 2023-02-24 NOTE — Assessment & Plan Note (Signed)
Stop Ambien. Attributing to anxiety.  Trial of Effexor.

## 2023-02-24 NOTE — Assessment & Plan Note (Signed)
10/15/22 started Lenvatinib 20 mg once daily Continue pembro every 3 weeks         10/28/22 MRI of brian. results negative for brain metastases Referral made for genetic testing Monitor for new symptoms Repeat CT in April 2025. Ordered today

## 2023-02-24 NOTE — Progress Notes (Unsigned)
Patient Care Team: Corwin Levins, MD as PCP - General  Clinic Day:  02/25/2023  ASSESSMENT & PLAN:   Assessment & Plan: Marcus Burns is a 43 year old pleasant male with history of right renal cell carcinoma with metastatic disease here for follow up.  Imaging showed peritoneal carcinomatosis, pulmonary nodules, liver metastases.    Overall tolerating well. Clinically with response. Hypertension from TKI uncontrolled. Will increase dose of telmisartan today. ECG done in the room today to review QTc. Will need to monitor thyroid function.   Diagnosis: mRCC Treatment:  10/15/2022 lenvatinib. 20 mg daily with pembrolizumab   He is tolerating treatment well.  Will continue.  Imaging shows stable disease.  Clear cell carcinoma of kidney (HCC) 10/15/22 started Lenvatinib 20 mg once daily Continue pembro every 3 weeks         10/28/22 MRI of brian. results negative for brain metastases Referral made for genetic testing Monitor for new symptoms   History of pulmonary embolism Continue on eliquis anticoagulation Bleeding precautions   Continue apixaban 5 mg twice daily  Insomnia Stop Ambien. Attributing to anxiety.  Trial of Effexor.  Hypertension BP 130's at home mostly Continue telmisartan 40 mg daily. Refilled 40 mg tabs today. Continue metoprolol 50 mg daily. Monitor BP daily at rest.   Anxiety Start Effexor XR at 37.5 mg daily  The patient understands the plans discussed today and is in agreement with them.  He knows to contact our office if he develops concerns prior to his next appointment.  [name], MD  [facility] - A [facility]PT [facility] [address] [place] [address] Dept: [phone] Dept Fax: [phone]   Orders Placed This Encounter  Procedures   CBC with Differential (Cancer Center Only)    Standing Status:   Future    Expected Date:   03/18/2023    Expiration Date:   03/17/2024    CMP (Cancer Center only)    Standing Status:   Future    Expected Date:   03/18/2023    Expiration Date:   03/17/2024   CBC with Differential (Cancer Center Only)    Standing Status:   Future    Expected Date:   04/08/2023    Expiration Date:   04/07/2024   CMP (Cancer Center only)    Standing Status:   Future    Expected Date:   04/08/2023    Expiration Date:   04/07/2024   TSH    Standing Status:   Future    Expected Date:   04/08/2023    Expiration Date:   04/07/2024   T4, free    Standing Status:   Future    Expected Date:   04/08/2023    Expiration Date:   04/07/2024   CBC with Differential (Cancer Center Only)    Standing Status:   Future    Expected Date:   04/29/2023    Expiration Date:   04/28/2024   CMP (Cancer Center only)    Standing Status:   Future    Expected Date:   04/29/2023    Expiration Date:   04/28/2024   CBC with Differential (Cancer Center Only)    Standing Status:   Future    Expected Date:   05/20/2023    Expiration Date:   05/19/2024   CMP (Cancer Center only)    Standing Status:   Future    Expected Date:   05/20/2023  Expiration Date:   05/19/2024   CBC with Differential (Cancer Center Only)    Standing Status:   Future    Expected Date:   06/10/2023    Expiration Date:   06/09/2024   CMP (Cancer Center only)    Standing Status:   Future    Expected Date:   06/10/2023    Expiration Date:   06/09/2024   TSH    Standing Status:   Future    Expected Date:   06/10/2023    Expiration Date:   06/09/2024   T4, free    Standing Status:   Future    Expected Date:   06/10/2023    Expiration Date:   06/09/2024   CBC with Differential (Cancer Center Only)    Standing Status:   Future    Expected Date:   07/01/2023    Expiration Date:   06/30/2024   CMP (Cancer Center only)    Standing Status:   Future    Expected Date:   07/01/2023    Expiration Date:   06/30/2024   CBC with Differential (Cancer Center Only)    Standing Status:   Future    Expected Date:   07/22/2023     Expiration Date:   07/21/2024   CMP (Cancer Center only)    Standing Status:   Future    Expected Date:   07/22/2023    Expiration Date:   07/21/2024   CBC with Differential (Cancer Center Only)    Standing Status:   Future    Expected Date:   08/12/2023    Expiration Date:   08/11/2024   CMP (Cancer Center only)    Standing Status:   Future    Expected Date:   08/12/2023    Expiration Date:   08/11/2024   TSH    Standing Status:   Future    Expected Date:   08/12/2023    Expiration Date:   08/11/2024   T4, free    Standing Status:   Future    Expected Date:   08/12/2023    Expiration Date:   08/11/2024      CHIEF COMPLAINT:  CC: mRCC  Current Treatment:  len/pembro  INTERVAL HISTORY:  Marcus Burns is here today for repeat clinical assessment. He denies signs of infection, bleeding. He denies pain including stomach pain. His appetite is good. He feels anxious and needs something for anxiety.  He denies any abdominal distention or changes.  I have reviewed the past medical history, past surgical history, social history and family history with the patient and they are unchanged from previous note.  ALLERGIES:  is allergic to amlodipine and doxycycline.  MEDICATIONS:  Current Outpatient Medications  Medication Sig Dispense Refill   venlafaxine XR (EFFEXOR-XR) 37.5 MG 24 hr capsule Take 1 capsule (37.5 mg total) by mouth at bedtime. 30 capsule 1   acetaminophen (TYLENOL) 500 MG tablet Take 1 tablet (500 mg total) by mouth every 6 (six) hours as needed for moderate pain (pain.).     albuterol (VENTOLIN HFA) 108 (90 Base) MCG/ACT inhaler Inhale 2 puffs into the lungs every 6 (six) hours as needed for wheezing or shortness of breath. 8 g 1   apixaban (ELIQUIS) 5 MG TABS tablet Take 1 tablet (5 mg total) by mouth 2 (two) times daily. 60 tablet 6   cetirizine (ZYRTEC) 10 MG tablet Take 10 mg by mouth daily.     docusate sodium (COLACE) 100 MG capsule Take 1 capsule (100  mg total) by mouth 2 (two) times  daily. (Patient taking differently: Take 100 mg by mouth as needed for mild constipation or moderate constipation.)     iron polysaccharides (NU-IRON) 150 MG capsule Take 1 capsule (150 mg total) by mouth daily. (Patient taking differently: Take 150 mg by mouth every other day.) 90 capsule 1   lenvatinib 20 mg daily dose (LENVIMA) 2 x 10 MG capsule Take 2 capsules (20 mg total) by mouth daily. 60 capsule 11   metoprolol succinate (TOPROL-XL) 50 MG 24 hr tablet Take 1 tablet (50 mg total) by mouth daily. Take with or immediately following a meal. 90 tablet 3   ondansetron (ZOFRAN) 8 MG tablet Take 1 tablet (8 mg total) by mouth every 8 (eight) hours as needed for nausea or vomiting. 30 tablet 1   prochlorperazine (COMPAZINE) 10 MG tablet Take 1 tablet (10 mg total) by mouth every 6 (six) hours as needed for nausea or vomiting. 30 tablet 1   telmisartan (MICARDIS) 40 MG tablet Take 1 tablet (40 mg total) by mouth daily. 90 tablet 1   zolpidem (AMBIEN) 10 MG tablet Take 1 tablet (10 mg total) by mouth at bedtime as needed for sleep. 30 tablet 0   No current facility-administered medications for this visit.   Facility-Administered Medications Ordered in Other Visits  Medication Dose Route Frequency Provider Last Rate Last Admin   0.9 %  sodium chloride infusion   Intravenous Once [name], MD       pembrolizumab [facility]) 200 mg in sodium chloride 0.9 % 50 mL chemo infusion  200 mg Intravenous Once [name], MD        HISTORY OF PRESENT ILLNESS:   Oncology History  Clear cell carcinoma of kidney (HCC)  03/11/2022 Imaging   CTA Extensive right-sided pulmonary emboli with evidence of right heart strain.   Pleural based somewhat rounded areas of increased airspace opacity in the right lower lobe likely representing early changes of pulmonary infarct.   Changes consistent with the known history of right renal cell carcinoma.   05/10/2022 Imaging   CTA 1. Significantly decreased  though not entirely resolved clot burden in the right lung with persistent small volume mural adherent clot. 2. Linear opacities in the right lower lobe may reflect atelectasis or developing scar. 3. Right upper pole renal mass again seen, incompletely imaged. 4. Cholelithiasis.   06/18/2022 Pathology Results   A. KIDNEY, RIGHT AND PERICAVAL LYMPH NODES, RADICAL NEPHRECTOMY:       Clear cell renal cell carcinoma, WHO / ISUP grade 3 (of 4).       Tumor size: 14.5 x 12 x 8.5 cm.       Tumor necrosis identified accounting for approximately 15% of  parenchyma.      Carcinoma disrupts capsule and invades into perinephric tissue,  adrenal parenchyma, renal vein and renal sinus.       Renal vein margin is positive for carcinoma.  Ureteral and vascular margins are negative for carcinoma.  Separated tumor satellite focus (0.5 cm).  Background renal parenchyma with focal interstitial inflammation without  glomerulosclerosis, tubal atrophy or vasculopathy.  See oncology table.   Procedure: Nephrectomy  Specimen Laterality: Right  Tumor Size: 14.5 x 12 x 8.5 cm  Tumor Focality: One focus of mail tumor (14.5 cm) with one fucus of  tumor satellite nodule (0.5 cm)  Histologic Type: Clear cell renal cell carcinoma  Sarcomatoid Features: Not identified  Rhabdoid Features: Not identified  Histologic  Grade: Grade 3  Tumor Necrosis: present, 15% of the parenchymal volume  Tumor Extension: and invades into perinephric tissue, adrenal  parenchyma, renal vein and renal sinus.  Lymphatic and/or Vascular Invasion:  Not identified  Margins: Renal vein margin is positive for tumor  Regional Lymph Nodes:       Number of Lymph Nodes with Tumor: 0            Number of Lymph Nodes Examined: 1  Distant Metastasis:       Distant Site(s) Involved: Not applicable  Additional Findings in Nonneoplastic Kidney: Focal interstitial  inflammation without glomerulosclerosis, tubal atrophy or vasculopathy  Pathologic  Stage Classification (pTNM, AJCC 8th Edition): pT4, pN0    07/16/2022 Initial Diagnosis   Clear cell carcinoma of kidney (HCC)   08/11/2022 -  Chemotherapy   Received first dose of pembrolizumab Patient is on Treatment Plan : RENAL Pembrolizumab (200) q21d      10/12/2022 Imaging   Presented with right sided flank pain.  CT AP 1. Status post right nephrectomy and right adrenalectomy. Poorly defined soft tissue density in the expected location of right renal fossa with extensive ill-defined nodular soft tissue thickening in the right retroperitoneum, concerning for residual or recurrent tumor and metastatic disease. 2. Enlarged liver with multiple hypodense areas suspicious for metastatic disease. Suspect large metastatic mass involving the caudate lobe of liver with more focal hypodensity in the vicinity of the IVC, probably related to tumor thrombus. 3. Triangular shaped gas and fluid collection either within the right hepatic lobe or abutting its margin given appearance on postoperative CT from June. This area measures roughly 5.4 by 4.5 cm and is indeterminate for necrotic tumor or infected fluid collection. 4. Small volume abdominopelvic ascites, new compared to prior exam. 5. Extensive right-sided predominant peritoneal metastatic disease. Small to moderate volume of pelvic ascites. 6. Interim finding of the new 4 mm left lung base pulmonary nodule, suspicious for a metastatic nodule. 7. Small right-sided pleural effusion with right middle lobe and right lower lobe atelectasis. 8. Gallstones. Nonobstructing left kidney stones.   10/14/2022 -  Hospital Admission   Seen while inpatient findings of metastatic disease.    10/15/2022 -  Chemotherapy   Patient is on Treatment Plan : Lenvatinib 20 mg daily RENAL Pembrolizumab (200) q21d     01/18/2023 Cancer Staging   Staging form: Kidney, AJCC 8th Edition - Clinical: Stage IV (cT4, cN0, cM1) - Signed by [name], MD on  01/18/2023 Histologic grade (G): G3 Histologic grading system: 4 grade system       REVIEW OF SYSTEMS:   All relevant systems were reviewed with the patient and are negative.   VITALS:  Blood pressure 128/80, pulse 62, temperature 97.6 F (36.4 C), temperature source Temporal, resp. rate 16, height 6\' 4"  (1.93 m), weight 286 lb 1.6 oz (129.8 kg), SpO2 100%.  Wt Readings from Last 3 Encounters:  02/25/23 286 lb 1.6 oz (129.8 kg)  02/04/23 294 lb 9.6 oz (133.6 kg)  01/14/23 300 lb (136.1 kg)    Body mass index is 34.83 kg/m.  Performance status (ECOG): 0 - Asymptomatic  PHYSICAL EXAM:   GENERAL:alert, no distress and comfortable SKIN: skin color normal, no rashes  EYES: normal, sclera clear LUNGS: clear to auscultation with normal breathing effort.  No wheeze or rales HEART: regular rate & rhythm and no murmurs and no lower extremity edema ABDOMEN: abdomen soft, non-tender and nondistended Musculoskeletal: no edema  LABORATORY DATA:  I have  reviewed the data as listed    Component Value Date/Time   NA 140 02/25/2023 1149   K 4.5 02/25/2023 1149   CL 107 02/25/2023 1149   CO2 29 02/25/2023 1149   GLUCOSE 86 02/25/2023 1149   BUN 13 02/25/2023 1149   CREATININE 1.46 (H) 02/25/2023 1149   CALCIUM 9.5 02/25/2023 1149   PROT 6.9 02/25/2023 1149   ALBUMIN 3.9 02/25/2023 1149   AST 60 (H) 02/25/2023 1149   ALT 16 02/25/2023 1149   ALKPHOS 95 02/25/2023 1149   BILITOT 0.7 02/25/2023 1149   GFRNONAA >60 02/25/2023 1149   GFRAA >60 02/23/2019 1406    No results found for: "SPEP", "UPEP"  Lab Results  Component Value Date   WBC 6.1 02/25/2023   NEUTROABS 2.8 02/25/2023   HGB 12.3 (L) 02/25/2023   HCT 38.3 (L) 02/25/2023   MCV 86.8 02/25/2023   PLT 281 02/25/2023      Chemistry      Component Value Date/Time   NA 140 02/25/2023 1149   K 4.5 02/25/2023 1149   CL 107 02/25/2023 1149   CO2 29 02/25/2023 1149   BUN 13 02/25/2023 1149   CREATININE 1.46 (H)  02/25/2023 1149      Component Value Date/Time   CALCIUM 9.5 02/25/2023 1149   ALKPHOS 95 02/25/2023 1149   AST 60 (H) 02/25/2023 1149   ALT 16 02/25/2023 1149   BILITOT 0.7 02/25/2023 1149       RADIOGRAPHIC STUDIES: I have personally reviewed the radiological images as listed and agreed with the findings in the report. CT CHEST ABDOMEN PELVIS W CONTRAST Result Date: 02/11/2023 CLINICAL DATA:  Restaging renal cell carcinoma, liver metastases and peritoneal carcinomatosis. * Tracking Code: BO * EXAM: CT CHEST, ABDOMEN, AND PELVIS WITH CONTRAST TECHNIQUE: Multidetector CT imaging of the chest, abdomen and pelvis was performed following the standard protocol during bolus administration of intravenous contrast. RADIATION DOSE REDUCTION: This exam was performed according to the departmental dose-optimization program which includes automated exposure control, adjustment of the mA and/or kV according to patient size and/or use of iterative reconstruction technique. CONTRAST:  OMNIPAQUE IOHEXOL 300 MG/ML  SOLN COMPARISON:  CT chest 10/28/2022 and CT abdomen pelvis 10/12/2022. FINDINGS: CT CHEST FINDINGS Cardiovascular: Heart size normal.  No pericardial effusion. Mediastinum/Nodes: Thoracic inlet lymph nodes are not enlarged by CT size criteria. No pathologically enlarged mediastinal, hilar or axillary lymph nodes. Esophagus is grossly unremarkable. Lungs/Pleura: Linear scarring in the anterior segment right upper lobe. Additional scarring in the right middle and right lower lobes. 2 mm posteromedial left lower lobe nodule (6/116), decreased in size from 3 mm on 10/28/2022. No new pulmonary nodules. Trace right pleural fluid. Airway is unremarkable. Musculoskeletal: Degenerative changes in the spine. No worrisome lytic or sclerotic lesions. CT ABDOMEN PELVIS FINDINGS Hepatobiliary: Nodules are seen throughout the liver, progressive from 10/12/2022. Index nodule in the dome of the liver measures 2.2 x  2.3 cm (2/43). Loculated collection of fluid and air in Morrison's pouch, similar. Stone in the gallbladder. No biliary ductal dilatation. Pancreas: Negative. Spleen: Negative. Adrenals/Urinary Tract: Right adrenal gland is poorly seen and may be surgically absent, along with the right kidney. Left adrenal gland and left kidney are unremarkable. Left ureter is decompressed. Bladder is low in volume. Stomach/Bowel: Stomach, small bowel, appendix and colon are unremarkable. Vascular/Lymphatic: Expansile low-attenuation within the region of the intrahepatic IVC (2/69), as before, worrisome for thrombus. No pathologically enlarged lymph nodes. Reproductive: Prostate is visualized. Other: Extensive  omental caking and peritoneal nodularity. Index peritoneal caking along the inferolateral margin of the right hepatic lobe measures up to approximately 2.4 cm (2/78), previously 2.0 cm. Small pelvic ascites. Musculoskeletal: Minimal degenerative change in the spine. No worrisome lytic or sclerotic lesions. IMPRESSION: 1. Progressive metastatic disease involving the liver and peritoneum. 2. 2 mm left lower lobe nodule, decreased slightly in size and likely a metastasis. 3. 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 4. Similar collection of fluid and air in Morrison's pouch, possibly postoperative in etiology. 5. Trace right pleural fluid. 6. Small pelvic ascites. 7. Cholelithiasis. Electronically Signed   By: Leanna Battles M.D.   On: 02/11/2023 12:15

## 2023-02-25 ENCOUNTER — Inpatient Hospital Stay (HOSPITAL_BASED_OUTPATIENT_CLINIC_OR_DEPARTMENT_OTHER): Payer: BC Managed Care – PPO

## 2023-02-25 ENCOUNTER — Inpatient Hospital Stay: Payer: BC Managed Care – PPO | Attending: Hematology and Oncology

## 2023-02-25 ENCOUNTER — Inpatient Hospital Stay: Payer: BC Managed Care – PPO

## 2023-02-25 VITALS — BP 128/80 | HR 62 | Temp 97.6°F | Resp 16 | Ht 76.0 in | Wt 286.1 lb

## 2023-02-25 DIAGNOSIS — C787 Secondary malignant neoplasm of liver and intrahepatic bile duct: Secondary | ICD-10-CM | POA: Diagnosis not present

## 2023-02-25 DIAGNOSIS — C641 Malignant neoplasm of right kidney, except renal pelvis: Secondary | ICD-10-CM | POA: Insufficient documentation

## 2023-02-25 DIAGNOSIS — C786 Secondary malignant neoplasm of retroperitoneum and peritoneum: Secondary | ICD-10-CM | POA: Diagnosis not present

## 2023-02-25 DIAGNOSIS — G4700 Insomnia, unspecified: Secondary | ICD-10-CM | POA: Insufficient documentation

## 2023-02-25 DIAGNOSIS — Z7901 Long term (current) use of anticoagulants: Secondary | ICD-10-CM | POA: Diagnosis not present

## 2023-02-25 DIAGNOSIS — F419 Anxiety disorder, unspecified: Secondary | ICD-10-CM | POA: Diagnosis not present

## 2023-02-25 DIAGNOSIS — I2699 Other pulmonary embolism without acute cor pulmonale: Secondary | ICD-10-CM | POA: Diagnosis not present

## 2023-02-25 DIAGNOSIS — Z86711 Personal history of pulmonary embolism: Secondary | ICD-10-CM | POA: Insufficient documentation

## 2023-02-25 DIAGNOSIS — I159 Secondary hypertension, unspecified: Secondary | ICD-10-CM

## 2023-02-25 DIAGNOSIS — G4709 Other insomnia: Secondary | ICD-10-CM | POA: Diagnosis not present

## 2023-02-25 DIAGNOSIS — I10 Essential (primary) hypertension: Secondary | ICD-10-CM | POA: Insufficient documentation

## 2023-02-25 DIAGNOSIS — Z5112 Encounter for antineoplastic immunotherapy: Secondary | ICD-10-CM | POA: Insufficient documentation

## 2023-02-25 LAB — CBC WITH DIFFERENTIAL (CANCER CENTER ONLY)
Abs Immature Granulocytes: 0.01 10*3/uL (ref 0.00–0.07)
Basophils Absolute: 0 10*3/uL (ref 0.0–0.1)
Basophils Relative: 1 %
Eosinophils Absolute: 1.7 10*3/uL — ABNORMAL HIGH (ref 0.0–0.5)
Eosinophils Relative: 28 %
HCT: 38.3 % — ABNORMAL LOW (ref 39.0–52.0)
Hemoglobin: 12.3 g/dL — ABNORMAL LOW (ref 13.0–17.0)
Immature Granulocytes: 0 %
Lymphocytes Relative: 21 %
Lymphs Abs: 1.3 10*3/uL (ref 0.7–4.0)
MCH: 27.9 pg (ref 26.0–34.0)
MCHC: 32.1 g/dL (ref 30.0–36.0)
MCV: 86.8 fL (ref 80.0–100.0)
Monocytes Absolute: 0.3 10*3/uL (ref 0.1–1.0)
Monocytes Relative: 6 %
Neutro Abs: 2.8 10*3/uL (ref 1.7–7.7)
Neutrophils Relative %: 44 %
Platelet Count: 281 10*3/uL (ref 150–400)
RBC: 4.41 MIL/uL (ref 4.22–5.81)
RDW: 18.1 % — ABNORMAL HIGH (ref 11.5–15.5)
WBC Count: 6.1 10*3/uL (ref 4.0–10.5)
nRBC: 0 % (ref 0.0–0.2)

## 2023-02-25 LAB — CMP (CANCER CENTER ONLY)
ALT: 16 U/L (ref 0–44)
AST: 60 U/L — ABNORMAL HIGH (ref 15–41)
Albumin: 3.9 g/dL (ref 3.5–5.0)
Alkaline Phosphatase: 95 U/L (ref 38–126)
Anion gap: 4 — ABNORMAL LOW (ref 5–15)
BUN: 13 mg/dL (ref 6–20)
CO2: 29 mmol/L (ref 22–32)
Calcium: 9.5 mg/dL (ref 8.9–10.3)
Chloride: 107 mmol/L (ref 98–111)
Creatinine: 1.46 mg/dL — ABNORMAL HIGH (ref 0.61–1.24)
GFR, Estimated: >60 mL/min (ref >60)
Glucose, Bld: 86 mg/dL (ref 70–99)
Potassium: 4.5 mmol/L (ref 3.5–5.1)
Sodium: 140 mmol/L (ref 135–145)
Total Bilirubin: 0.7 mg/dL (ref 0.0–1.2)
Total Protein: 6.9 g/dL (ref 6.5–8.1)

## 2023-02-25 LAB — T4, FREE: Free T4: 0.89 ng/dL (ref 0.61–1.12)

## 2023-02-25 LAB — TSH: TSH: 6.003 u[IU]/mL — ABNORMAL HIGH (ref 0.350–4.500)

## 2023-02-25 MED ORDER — VENLAFAXINE HCL ER 37.5 MG PO CP24: 37.5000 mg | ORAL_CAPSULE | Freq: Every evening | ORAL | 1 refills | Status: DC

## 2023-02-25 MED ORDER — SODIUM CHLORIDE 0.9 % IV SOLN: Freq: Once | INTRAVENOUS | Status: AC

## 2023-02-25 MED ORDER — PEMBROLIZUMAB CHEMO INJECTION 100 MG/4ML
200.0000 mg | Freq: Once | INTRAVENOUS | Status: AC
  Administered 2023-02-25: 200 mg via INTRAVENOUS
  Filled 2023-02-25: qty 200

## 2023-02-25 MED ORDER — TELMISARTAN 40 MG PO TABS: 40.0000 mg | ORAL_TABLET | Freq: Every day | ORAL | 1 refills | Status: DC

## 2023-02-25 NOTE — Patient Instructions (Signed)
 [facility]  Discharge Instructions: Thank you for choosing [facility] to provide your oncology and hematology care.   If you have a lab appointment with the Cancer Center, please go directly to the Cancer Center and check in at the registration area.   Wear comfortable clothing and clothing appropriate for easy access to any Portacath or PICC line.   We strive to give you quality time with your provider. You may need to reschedule your appointment if you arrive late (15 or more minutes).  Arriving late affects you and other patients whose appointments are after yours.  Also, if you miss three or more appointments without notifying the office, you may be dismissed from the clinic at the provider's discretion.      For prescription refill requests, have your pharmacy contact our office and allow 72 hours for refills to be completed.    Today you received the following chemotherapy and/or immunotherapy agents Rande Lawman      To help prevent nausea and vomiting after your treatment, we encourage you to take your nausea medication as directed.  BELOW ARE SYMPTOMS THAT SHOULD BE REPORTED IMMEDIATELY: *FEVER GREATER THAN 100.4 F (38 C) OR HIGHER *CHILLS OR SWEATING *NAUSEA AND VOMITING THAT IS NOT CONTROLLED WITH YOUR NAUSEA MEDICATION *UNUSUAL SHORTNESS OF BREATH *UNUSUAL BRUISING OR BLEEDING *URINARY PROBLEMS (pain or burning when urinating, or frequent urination) *BOWEL PROBLEMS (unusual diarrhea, constipation, pain near the anus) TENDERNESS IN MOUTH AND THROAT WITH OR WITHOUT PRESENCE OF ULCERS (sore throat, sores in mouth, or a toothache) UNUSUAL RASH, SWELLING OR PAIN  UNUSUAL VAGINAL DISCHARGE OR ITCHING   Items with * indicate a potential emergency and should be followed up as soon as possible or go to the Emergency Department if any problems should occur.  Please show the CHEMOTHERAPY ALERT CARD or IMMUNOTHERAPY  ALERT CARD at check-in to the Emergency Department and triage nurse.  Should you have questions after your visit or need to cancel or reschedule your appointment, please contact [facility]  Dept: [phone]  and follow the prompts.  Office hours are 8:00 a.m. to 4:30 p.m. Monday - Friday. Please note that voicemails left after 4:00 p.m. may not be returned until the following business day.  We are closed weekends and major holidays. You have access to a nurse at all times for urgent questions. Please call the main number to the clinic Dept: [phone] and follow the prompts.   For any non-urgent questions, you may also contact your provider using MyChart. We now offer e-Visits for anyone 85 and older to request care online for non-urgent symptoms. For details visit [email or web address].   Also download the MyChart app! Go to the app store, search "MyChart", open the app, select , and log in with your MyChart username and password.

## 2023-02-25 NOTE — Assessment & Plan Note (Addendum)
BP 130's at home mostly Continue telmisartan 40 mg daily. Refilled 40 mg tabs today. Continue metoprolol 50 mg daily. Monitor BP daily at rest.

## 2023-02-25 NOTE — Assessment & Plan Note (Signed)
Start Effexor XR at 37.5 mg daily

## 2023-02-27 ENCOUNTER — Other Ambulatory Visit: Payer: Self-pay

## 2023-03-14 ENCOUNTER — Telehealth: Payer: Self-pay

## 2023-03-14 NOTE — Telephone Encounter (Signed)
 Patient called to reschedule appts due to work conflict.

## 2023-03-15 ENCOUNTER — Other Ambulatory Visit: Payer: Self-pay

## 2023-03-15 ENCOUNTER — Ambulatory Visit: Payer: BC Managed Care – PPO

## 2023-03-15 ENCOUNTER — Other Ambulatory Visit: Payer: BC Managed Care – PPO

## 2023-03-15 ENCOUNTER — Telehealth: Payer: Self-pay

## 2023-03-15 NOTE — Telephone Encounter (Signed)
 Completed - Called patient to update him on 6/6 appt changes.

## 2023-03-16 NOTE — Progress Notes (Unsigned)
 Patient Care Team: Corwin Levins, MD as PCP - General  Clinic Day:  03/17/2023  Referring physician: Corwin Levins, MD  ASSESSMENT & PLAN:   Assessment & Plan: Marcus Burns is a 43 year old pleasant male with history of right renal cell carcinoma with metastatic disease here for follow up.  Imaging showed peritoneal carcinomatosis, pulmonary nodules, liver metastases.    Overall tolerating well. Clinically with response. Hypertension from TKI uncontrolled. Will increase dose of telmisartan today. ECG done in the room today to review QTc. Will need to monitor thyroid function.   Diagnosis: mRCC Treatment:  10/15/2022 lenvatinib. 20 mg daily with pembrolizumab   He is tolerating treatment well.  Clinically with response. Will continue.  Imaging shows stable disease.  Clear cell carcinoma of kidney (HCC) 10/15/22 started Lenvatinib 20 mg once daily Continue pembro every 3 weeks         10/28/22 MRI of brian. results negative for brain metastases Referral made for genetic testing Monitor for new symptoms   Hypertension BP 130's at home mostly Continue telmisartan 40 mg daily. Refilled 40 mg tabs today. Continue metoprolol 50 mg daily. Monitor BP daily at rest.   CKD (chronic kidney disease) Solitary kidney Stable  Monitor each cycle  Insomnia Attributing to stress & anxiety.  Doing better this week  History of pulmonary embolism Continue on eliquis anticoagulation Bleeding precautions  At risk for side effect of medication Monitor BP at home daily and in clinic after 1 week, then every 2 weeks for 2 months, and at least monthly thereafter.  LFTs (at baseline, every 2 weeks for 2 months, and at least monthly thereafter) BMP, fT4, TSH levels at baseline and monthly or as clinically indicated monitor for proteinuria at baseline and periodically during treatment (urine dipstick; if 2+ then obtain a 24-hour urine protein).  Baseline ECG (10/15/22 Qtc 443) and Echo.  Repeat ECG on  11/26 showed Qtc 432 ms. ECG in select patients (congenital long QT syndrome, heart failure, bradyarrhythmias, or in those on concomitant medications known to prolong the QT interval).  Monitor for clinical signs/symptoms of cardiac dysfunction, arterial thrombosis, reversible posterior leukoencephalopathy syndrome (confirm with MRI), fistula formation, GI perforation, bleeding/hemorrhagic events, diarrhea, dehydration, and wound healing complications. Dental exam periodically during treatment.     The patient understands the plans discussed today and is in agreement with them.  He knows to contact our office if he develops concerns prior to his next appointment.  [name], MD  [facility] - A DEPT OF [facility] [address] [place] [address] Dept: [phone] Dept Fax: [phone]   No orders of the defined types were placed in this encounter.     CHIEF COMPLAINT:  CC: mRCC  Current Treatment:  len/pembro  INTERVAL HISTORY:  Marcus Burns is here today for repeat clinical assessment. He denies n/v, stomach pain. Diarrhea once every 2 weeks resolved on its own. No fevers or chills. He denies trouble urinating. His appetite is good. No new headache, chest pain. Appetite is good.  I have reviewed the past medical history, past surgical history, social history and family history with the patient and they are unchanged from previous note.  ALLERGIES:  is allergic to amlodipine and doxycycline.  MEDICATIONS:  Current Outpatient Medications  Medication Sig Dispense Refill   acetaminophen (TYLENOL) 500 MG tablet Take 1 tablet (500 mg total) by mouth every 6 (six) hours as needed for moderate pain (  pain.).     albuterol (VENTOLIN HFA) 108 (90 Base) MCG/ACT inhaler Inhale 2 puffs into the lungs every 6 (six) hours as needed for wheezing or shortness of breath. 8 g 1   apixaban (ELIQUIS) 5 MG TABS tablet Take 1 tablet  (5 mg total) by mouth 2 (two) times daily. 60 tablet 6   cetirizine (ZYRTEC) 10 MG tablet Take 10 mg by mouth daily.     docusate sodium (COLACE) 100 MG capsule Take 1 capsule (100 mg total) by mouth 2 (two) times daily. (Patient taking differently: Take 100 mg by mouth as needed for mild constipation or moderate constipation.)     iron polysaccharides (NU-IRON) 150 MG capsule Take 1 capsule (150 mg total) by mouth daily. (Patient taking differently: Take 150 mg by mouth every other day.) 90 capsule 1   lenvatinib 20 mg daily dose (LENVIMA) 2 x 10 MG capsule Take 2 capsules (20 mg total) by mouth daily. 60 capsule 11   metoprolol succinate (TOPROL-XL) 50 MG 24 hr tablet Take 1 tablet (50 mg total) by mouth daily. Take with or immediately following a meal. 90 tablet 3   ondansetron (ZOFRAN) 8 MG tablet Take 1 tablet (8 mg total) by mouth every 8 (eight) hours as needed for nausea or vomiting. 30 tablet 1   prochlorperazine (COMPAZINE) 10 MG tablet Take 1 tablet (10 mg total) by mouth every 6 (six) hours as needed for nausea or vomiting. 30 tablet 1   telmisartan (MICARDIS) 40 MG tablet Take 1 tablet (40 mg total) by mouth daily. 90 tablet 1   venlafaxine XR (EFFEXOR-XR) 37.5 MG 24 hr capsule Take 1 capsule (37.5 mg total) by mouth at bedtime. 30 capsule 1   zolpidem (AMBIEN) 10 MG tablet Take 1 tablet (10 mg total) by mouth at bedtime as needed for sleep. 30 tablet 0   No current facility-administered medications for this visit.   Facility-Administered Medications Ordered in Other Visits  Medication Dose Route Frequency Provider Last Rate Last Admin   pembrolizumab (KEYTRUDA) 200 mg in sodium chloride 0.9 % 50 mL chemo infusion  200 mg Intravenous Once [name], MD 116 mL/hr at 03/17/23 1251 200 mg at 03/17/23 1251    HISTORY OF PRESENT ILLNESS:   Oncology History  Clear cell carcinoma of kidney (HCC)  03/11/2022 Imaging   CTA Extensive right-sided pulmonary emboli with evidence of right  heart strain.   Pleural based somewhat rounded areas of increased airspace opacity in the right lower lobe likely representing early changes of pulmonary infarct.   Changes consistent with the known history of right renal cell carcinoma.   05/10/2022 Imaging   CTA 1. Significantly decreased though not entirely resolved clot burden in the right lung with persistent small volume mural adherent clot. 2. Linear opacities in the right lower lobe may reflect atelectasis or developing scar. 3. Right upper pole renal mass again seen, incompletely imaged. 4. Cholelithiasis.   06/18/2022 Pathology Results   A. KIDNEY, RIGHT AND PERICAVAL LYMPH NODES, RADICAL NEPHRECTOMY:       Clear cell renal cell carcinoma, WHO / ISUP grade 3 (of 4).       Tumor size: 14.5 x 12 x 8.5 cm.       Tumor necrosis identified accounting for approximately 15% of  parenchyma.      Carcinoma disrupts capsule and invades into perinephric tissue,  adrenal parenchyma, renal vein and renal sinus.       Renal vein margin is positive for  carcinoma.  Ureteral and vascular margins are negative for carcinoma.  Separated tumor satellite focus (0.5 cm).  Background renal parenchyma with focal interstitial inflammation without  glomerulosclerosis, tubal atrophy or vasculopathy.  See oncology table.   Procedure: Nephrectomy  Specimen Laterality: Right  Tumor Size: 14.5 x 12 x 8.5 cm  Tumor Focality: One focus of mail tumor (14.5 cm) with one fucus of  tumor satellite nodule (0.5 cm)  Histologic Type: Clear cell renal cell carcinoma  Sarcomatoid Features: Not identified  Rhabdoid Features: Not identified  Histologic Grade: Grade 3  Tumor Necrosis: present, 15% of the parenchymal volume  Tumor Extension: and invades into perinephric tissue, adrenal  parenchyma, renal vein and renal sinus.  Lymphatic and/or Vascular Invasion:  Not identified  Margins: Renal vein margin is positive for tumor  Regional Lymph Nodes:        Number of Lymph Nodes with Tumor: 0            Number of Lymph Nodes Examined: 1  Distant Metastasis:       Distant Site(s) Involved: Not applicable  Additional Findings in Nonneoplastic Kidney: Focal interstitial  inflammation without glomerulosclerosis, tubal atrophy or vasculopathy  Pathologic Stage Classification (pTNM, AJCC 8th Edition): pT4, pN0    07/16/2022 Initial Diagnosis   Clear cell carcinoma of kidney (HCC)   08/11/2022 -  Chemotherapy   Received first dose of pembrolizumab Patient is on Treatment Plan : RENAL Pembrolizumab (200) q21d      10/12/2022 Imaging   Presented with right sided flank pain.  CT AP 1. Status post right nephrectomy and right adrenalectomy. Poorly defined soft tissue density in the expected location of right renal fossa with extensive ill-defined nodular soft tissue thickening in the right retroperitoneum, concerning for residual or recurrent tumor and metastatic disease. 2. Enlarged liver with multiple hypodense areas suspicious for metastatic disease. Suspect large metastatic mass involving the caudate lobe of liver with more focal hypodensity in the vicinity of the IVC, probably related to tumor thrombus. 3. Triangular shaped gas and fluid collection either within the right hepatic lobe or abutting its margin given appearance on postoperative CT from June. This area measures roughly 5.4 by 4.5 cm and is indeterminate for necrotic tumor or infected fluid collection. 4. Small volume abdominopelvic ascites, new compared to prior exam. 5. Extensive right-sided predominant peritoneal metastatic disease. Small to moderate volume of pelvic ascites. 6. Interim finding of the new 4 mm left lung base pulmonary nodule, suspicious for a metastatic nodule. 7. Small right-sided pleural effusion with right middle lobe and right lower lobe atelectasis. 8. Gallstones. Nonobstructing left kidney stones.   10/14/2022 -  Hospital Admission   Seen while  inpatient findings of metastatic disease.    10/15/2022 -  Chemotherapy   Patient is on Treatment Plan : Lenvatinib 20 mg daily RENAL Pembrolizumab (200) q21d     01/18/2023 Cancer Staging   Staging form: Kidney, AJCC 8th Edition - Clinical: Stage IV (cT4, cN0, cM1) - Signed by [name], MD on 01/18/2023 Histologic grade (G): G3 Histologic grading system: 4 grade system       REVIEW OF SYSTEMS:   All relevant systems were reviewed with the patient and are negative.   VITALS:  Blood pressure (!) 139/90, pulse 75, temperature (!) 97 F (36.1 C), temperature source Temporal, resp. rate 14, weight 286 lb 12.8 oz (130.1 kg), SpO2 100%.  Wt Readings from Last 3 Encounters:  03/17/23 286 lb 12.8 oz (130.1 kg)  02/25/23  286 lb 1.6 oz (129.8 kg)  02/04/23 294 lb 9.6 oz (133.6 kg)    Body mass index is 34.91 kg/m.  Performance status (ECOG): 0 - Asymptomatic  PHYSICAL EXAM:   GENERAL: alert, no distress and comfortable LUNGS: clear to auscultation with normal breathing effort.  No wheeze or rales HEART: regular rate & rhythm and no murmurs and no lower extremity edema ABDOMEN: abdomen soft, non-tender and nondistended Musculoskeletal: no edema NEURO: alert, fluent speech, no focal motor/sensory deficits.  Strength and sensation equal bilaterally.  LABORATORY DATA:  I have reviewed the data as listed    Component Value Date/Time   NA 140 03/17/2023 1102   K 4.0 03/17/2023 1102   CL 110 03/17/2023 1102   CO2 27 03/17/2023 1102   GLUCOSE 92 03/17/2023 1102   BUN 13 03/17/2023 1102   CREATININE 1.39 (H) 03/17/2023 1102   CALCIUM 8.6 (L) 03/17/2023 1102   PROT 6.6 03/17/2023 1102   ALBUMIN 3.6 03/17/2023 1102   AST 72 (H) 03/17/2023 1102   ALT 22 03/17/2023 1102   ALKPHOS 81 03/17/2023 1102   BILITOT 0.7 03/17/2023 1102   GFRNONAA >60 03/17/2023 1102   GFRAA >60 02/23/2019 1406    No results found for: "SPEP", "UPEP"  Lab Results  Component Value Date   WBC  6.5 03/17/2023   NEUTROABS 2.6 03/17/2023   HGB 11.5 (L) 03/17/2023   HCT 35.5 (L) 03/17/2023   MCV 90.1 03/17/2023   PLT 216 03/17/2023      Chemistry      Component Value Date/Time   NA 140 03/17/2023 1102   K 4.0 03/17/2023 1102   CL 110 03/17/2023 1102   CO2 27 03/17/2023 1102   BUN 13 03/17/2023 1102   CREATININE 1.39 (H) 03/17/2023 1102      Component Value Date/Time   CALCIUM 8.6 (L) 03/17/2023 1102   ALKPHOS 81 03/17/2023 1102   AST 72 (H) 03/17/2023 1102   ALT 22 03/17/2023 1102   BILITOT 0.7 03/17/2023 1102       RADIOGRAPHIC STUDIES: I have personally reviewed the radiological images as listed and agreed with the findings in the report. No results found.

## 2023-03-16 NOTE — Assessment & Plan Note (Signed)
 10/15/22 started Lenvatinib 20 mg once daily Continue pembro every 3 weeks         10/28/22 MRI of brian. results negative for brain metastases Referral made for genetic testing Monitor for new symptoms Repeat CT in April 2025. Ordered today

## 2023-03-17 ENCOUNTER — Inpatient Hospital Stay (HOSPITAL_BASED_OUTPATIENT_CLINIC_OR_DEPARTMENT_OTHER)

## 2023-03-17 ENCOUNTER — Inpatient Hospital Stay: Attending: Hematology and Oncology

## 2023-03-17 ENCOUNTER — Inpatient Hospital Stay

## 2023-03-17 VITALS — BP 139/90 | HR 75 | Temp 97.0°F | Resp 14 | Wt 286.8 lb

## 2023-03-17 DIAGNOSIS — N1831 Chronic kidney disease, stage 3a: Secondary | ICD-10-CM

## 2023-03-17 DIAGNOSIS — I129 Hypertensive chronic kidney disease with stage 1 through stage 4 chronic kidney disease, or unspecified chronic kidney disease: Secondary | ICD-10-CM | POA: Diagnosis not present

## 2023-03-17 DIAGNOSIS — C786 Secondary malignant neoplasm of retroperitoneum and peritoneum: Secondary | ICD-10-CM | POA: Insufficient documentation

## 2023-03-17 DIAGNOSIS — Z5112 Encounter for antineoplastic immunotherapy: Secondary | ICD-10-CM | POA: Diagnosis present

## 2023-03-17 DIAGNOSIS — C641 Malignant neoplasm of right kidney, except renal pelvis: Secondary | ICD-10-CM | POA: Diagnosis present

## 2023-03-17 DIAGNOSIS — Z79620 Long term (current) use of immunosuppressive biologic: Secondary | ICD-10-CM | POA: Diagnosis not present

## 2023-03-17 DIAGNOSIS — N189 Chronic kidney disease, unspecified: Secondary | ICD-10-CM | POA: Diagnosis not present

## 2023-03-17 DIAGNOSIS — I159 Secondary hypertension, unspecified: Secondary | ICD-10-CM | POA: Diagnosis not present

## 2023-03-17 DIAGNOSIS — C787 Secondary malignant neoplasm of liver and intrahepatic bile duct: Secondary | ICD-10-CM | POA: Insufficient documentation

## 2023-03-17 DIAGNOSIS — Z86711 Personal history of pulmonary embolism: Secondary | ICD-10-CM

## 2023-03-17 DIAGNOSIS — G4700 Insomnia, unspecified: Secondary | ICD-10-CM

## 2023-03-17 DIAGNOSIS — Z9189 Other specified personal risk factors, not elsewhere classified: Secondary | ICD-10-CM

## 2023-03-17 LAB — CBC WITH DIFFERENTIAL (CANCER CENTER ONLY)
Abs Immature Granulocytes: 0.01 10*3/uL (ref 0.00–0.07)
Basophils Absolute: 0 10*3/uL (ref 0.0–0.1)
Basophils Relative: 1 %
Eosinophils Absolute: 2.2 10*3/uL — ABNORMAL HIGH (ref 0.0–0.5)
Eosinophils Relative: 35 %
HCT: 35.5 % — ABNORMAL LOW (ref 39.0–52.0)
Hemoglobin: 11.5 g/dL — ABNORMAL LOW (ref 13.0–17.0)
Immature Granulocytes: 0 %
Lymphocytes Relative: 19 %
Lymphs Abs: 1.2 10*3/uL (ref 0.7–4.0)
MCH: 29.2 pg (ref 26.0–34.0)
MCHC: 32.4 g/dL (ref 30.0–36.0)
MCV: 90.1 fL (ref 80.0–100.0)
Monocytes Absolute: 0.3 10*3/uL (ref 0.1–1.0)
Monocytes Relative: 5 %
Neutro Abs: 2.6 10*3/uL (ref 1.7–7.7)
Neutrophils Relative %: 40 %
Platelet Count: 216 10*3/uL (ref 150–400)
RBC: 3.94 MIL/uL — ABNORMAL LOW (ref 4.22–5.81)
RDW: 17 % — ABNORMAL HIGH (ref 11.5–15.5)
WBC Count: 6.5 10*3/uL (ref 4.0–10.5)
nRBC: 0 % (ref 0.0–0.2)

## 2023-03-17 LAB — CMP (CANCER CENTER ONLY)
ALT: 22 U/L (ref 0–44)
AST: 72 U/L — ABNORMAL HIGH (ref 15–41)
Albumin: 3.6 g/dL (ref 3.5–5.0)
Alkaline Phosphatase: 81 U/L (ref 38–126)
Anion gap: 3 — ABNORMAL LOW (ref 5–15)
BUN: 13 mg/dL (ref 6–20)
CO2: 27 mmol/L (ref 22–32)
Calcium: 8.6 mg/dL — ABNORMAL LOW (ref 8.9–10.3)
Chloride: 110 mmol/L (ref 98–111)
Creatinine: 1.39 mg/dL — ABNORMAL HIGH (ref 0.61–1.24)
GFR, Estimated: >60 mL/min (ref >60)
Glucose, Bld: 92 mg/dL (ref 70–99)
Potassium: 4 mmol/L (ref 3.5–5.1)
Sodium: 140 mmol/L (ref 135–145)
Total Bilirubin: 0.7 mg/dL (ref 0.0–1.2)
Total Protein: 6.6 g/dL (ref 6.5–8.1)

## 2023-03-17 LAB — GENETIC SCREENING ORDER

## 2023-03-17 LAB — TSH: TSH: 5.505 u[IU]/mL — ABNORMAL HIGH (ref 0.350–4.500)

## 2023-03-17 LAB — T4, FREE: Free T4: 1.02 ng/dL (ref 0.61–1.12)

## 2023-03-17 MED ORDER — SODIUM CHLORIDE 0.9 % IV SOLN
200.0000 mg | Freq: Once | INTRAVENOUS | Status: AC
  Administered 2023-03-17: 200 mg via INTRAVENOUS
  Filled 2023-03-17: qty 200

## 2023-03-17 MED ORDER — SODIUM CHLORIDE 0.9 % IV SOLN: Freq: Once | INTRAVENOUS | Status: AC

## 2023-03-17 NOTE — Assessment & Plan Note (Signed)
 Solitary kidney Stable  Monitor each cycle

## 2023-03-17 NOTE — Assessment & Plan Note (Signed)
 BP 130's at home mostly Continue telmisartan 40 mg daily. Refilled 40 mg tabs today. Continue metoprolol 50 mg daily. Monitor BP daily at rest.

## 2023-03-17 NOTE — Assessment & Plan Note (Signed)
 Continue on eliquis anticoagulation Bleeding precautions

## 2023-03-17 NOTE — Assessment & Plan Note (Signed)
 Monitor BP at home daily and in clinic after 1 week, then every 2 weeks for 2 months, and at least monthly thereafter.  LFTs (at baseline, every 2 weeks for 2 months, and at least monthly thereafter) BMP, fT4, TSH levels at baseline and monthly or as clinically indicated monitor for proteinuria at baseline and periodically during treatment (urine dipstick; if 2+ then obtain a 24-hour urine protein).  Baseline ECG (10/15/22 Qtc 443) and Echo.  Repeat ECG on 11/26 showed Qtc 432 ms. ECG in select patients (congenital long QT syndrome, heart failure, bradyarrhythmias, or in those on concomitant medications known to prolong the QT interval).  Monitor for clinical signs/symptoms of cardiac dysfunction, arterial thrombosis, reversible posterior leukoencephalopathy syndrome (confirm with MRI), fistula formation, GI perforation, bleeding/hemorrhagic events, diarrhea, dehydration, and wound healing complications. Dental exam periodically during treatment.

## 2023-03-17 NOTE — Assessment & Plan Note (Signed)
 Attributing to stress & anxiety.  Doing better this week

## 2023-03-18 ENCOUNTER — Inpatient Hospital Stay: Payer: BC Managed Care – PPO

## 2023-03-22 ENCOUNTER — Telehealth: Payer: Self-pay | Admitting: Genetic Counselor

## 2023-03-22 NOTE — Telephone Encounter (Signed)
 [facility] that a blood sample from the patient is in their lab.  Looking in the chart it appears that Mr. Marcus Burns wanted to cancel that sample and draw later in the year.  I called the patient to confirm that he wanted the blood sample cancelled and asked if he wanted to keep the April 10 genetic counseling appointment.  He wants to cancel the appointment as well and will r/s when it is convenient.  Both the blood sample and appointment were cancelled.

## 2023-03-24 ENCOUNTER — Other Ambulatory Visit: Payer: Self-pay

## 2023-03-29 ENCOUNTER — Telehealth: Payer: Self-pay

## 2023-03-29 NOTE — Telephone Encounter (Signed)
 Left a VM to let the pt know that his FMLA forms were completed and ready for pick up.

## 2023-04-08 ENCOUNTER — Inpatient Hospital Stay: Payer: BC Managed Care – PPO | Attending: Hematology and Oncology

## 2023-04-08 ENCOUNTER — Inpatient Hospital Stay: Payer: BC Managed Care – PPO

## 2023-04-08 VITALS — BP 126/89 | HR 67 | Temp 97.6°F | Resp 20 | Wt 277.6 lb

## 2023-04-08 VITALS — BP 114/80 | HR 67 | Resp 18

## 2023-04-08 DIAGNOSIS — N1831 Chronic kidney disease, stage 3a: Secondary | ICD-10-CM | POA: Insufficient documentation

## 2023-04-08 DIAGNOSIS — C641 Malignant neoplasm of right kidney, except renal pelvis: Secondary | ICD-10-CM | POA: Insufficient documentation

## 2023-04-08 DIAGNOSIS — R188 Other ascites: Secondary | ICD-10-CM | POA: Diagnosis not present

## 2023-04-08 DIAGNOSIS — Z79620 Long term (current) use of immunosuppressive biologic: Secondary | ICD-10-CM | POA: Diagnosis not present

## 2023-04-08 DIAGNOSIS — C786 Secondary malignant neoplasm of retroperitoneum and peritoneum: Secondary | ICD-10-CM | POA: Insufficient documentation

## 2023-04-08 DIAGNOSIS — Z7901 Long term (current) use of anticoagulants: Secondary | ICD-10-CM | POA: Diagnosis not present

## 2023-04-08 DIAGNOSIS — I159 Secondary hypertension, unspecified: Secondary | ICD-10-CM

## 2023-04-08 DIAGNOSIS — Z5112 Encounter for antineoplastic immunotherapy: Secondary | ICD-10-CM | POA: Insufficient documentation

## 2023-04-08 DIAGNOSIS — Z86711 Personal history of pulmonary embolism: Secondary | ICD-10-CM

## 2023-04-08 LAB — TSH: TSH: 7.954 u[IU]/mL — ABNORMAL HIGH (ref 0.350–4.500)

## 2023-04-08 LAB — CBC WITH DIFFERENTIAL (CANCER CENTER ONLY)
Abs Immature Granulocytes: 0.01 10*3/uL (ref 0.00–0.07)
Basophils Absolute: 0 10*3/uL (ref 0.0–0.1)
Basophils Relative: 1 %
Eosinophils Absolute: 1.2 10*3/uL — ABNORMAL HIGH (ref 0.0–0.5)
Eosinophils Relative: 23 %
HCT: 37 % — ABNORMAL LOW (ref 39.0–52.0)
Hemoglobin: 11.9 g/dL — ABNORMAL LOW (ref 13.0–17.0)
Immature Granulocytes: 0 %
Lymphocytes Relative: 25 %
Lymphs Abs: 1.3 10*3/uL (ref 0.7–4.0)
MCH: 29.5 pg (ref 26.0–34.0)
MCHC: 32.2 g/dL (ref 30.0–36.0)
MCV: 91.6 fL (ref 80.0–100.0)
Monocytes Absolute: 0.3 10*3/uL (ref 0.1–1.0)
Monocytes Relative: 7 %
Neutro Abs: 2.3 10*3/uL (ref 1.7–7.7)
Neutrophils Relative %: 44 %
Platelet Count: 252 10*3/uL (ref 150–400)
RBC: 4.04 MIL/uL — ABNORMAL LOW (ref 4.22–5.81)
RDW: 16.1 % — ABNORMAL HIGH (ref 11.5–15.5)
WBC Count: 5.1 10*3/uL (ref 4.0–10.5)
nRBC: 0 % (ref 0.0–0.2)

## 2023-04-08 LAB — CMP (CANCER CENTER ONLY)
ALT: 15 U/L (ref 0–44)
AST: 81 U/L — ABNORMAL HIGH (ref 15–41)
Albumin: 3.8 g/dL (ref 3.5–5.0)
Alkaline Phosphatase: 82 U/L (ref 38–126)
Anion gap: 6 (ref 5–15)
BUN: 13 mg/dL (ref 6–20)
CO2: 27 mmol/L (ref 22–32)
Calcium: 9.3 mg/dL (ref 8.9–10.3)
Chloride: 108 mmol/L (ref 98–111)
Creatinine: 1.48 mg/dL — ABNORMAL HIGH (ref 0.61–1.24)
GFR, Estimated: 60 mL/min — ABNORMAL LOW (ref >60)
Glucose, Bld: 86 mg/dL (ref 70–99)
Potassium: 4.1 mmol/L (ref 3.5–5.1)
Sodium: 141 mmol/L (ref 135–145)
Total Bilirubin: 0.8 mg/dL (ref 0.0–1.2)
Total Protein: 6.8 g/dL (ref 6.5–8.1)

## 2023-04-08 LAB — T4, FREE: Free T4: 0.97 ng/dL (ref 0.61–1.12)

## 2023-04-08 MED ORDER — SODIUM CHLORIDE 0.9 % IV SOLN: Freq: Once | INTRAVENOUS | Status: AC

## 2023-04-08 MED ORDER — SODIUM CHLORIDE 0.9 % IV SOLN
200.0000 mg | Freq: Once | INTRAVENOUS | Status: AC
  Administered 2023-04-08: 200 mg via INTRAVENOUS
  Filled 2023-04-08: qty 200

## 2023-04-08 NOTE — Assessment & Plan Note (Addendum)
 Continue on eliquis anticoagulation Bleeding precautions No bleeding

## 2023-04-08 NOTE — Patient Instructions (Signed)

## 2023-04-08 NOTE — Progress Notes (Signed)
 [facility] OFFICE PROGRESS NOTE  Patient Care Team: Marcus Levins, MD as PCP - General  Marcus Burns is a 43 year old pleasant male with history of right renal cell carcinoma with metastatic disease here for follow up.  Imaging showed peritoneal carcinomatosis, pulmonary nodules, liver metastases.    Overall tolerating well. Clinically with response. Hypertension from TKI uncontrolled. Will increase dose of telmisartan today. ECG done in the room today to review QTc. Will need to monitor thyroid function.   Diagnosis: mRCC Treatment:  10/15/2022 lenvatinib. 20 mg daily with pembrolizumab.  Last CT showed stable disease. No new symptoms. Discuss timing of next CT scan.  Given he is asymptomatic and overall feeling better, no concerning findings clinically, will postpone CT to end of May. Assessment & Plan Clear cell carcinoma of right kidney (HCC) 10/15/22 started Lenvatinib 20 mg once daily Continue pembro every 3 weeks         10/28/22 MRI of brian. results negative for brain metastases Referral made for genetic testing Monitor for new symptoms Will plan on new CT about end of May  Secondary hypertension BP 130's at home mostly Continue telmisartan 40 mg daily.  Continue metoprolol 50 mg daily. Monitor BP daily at rest.  Stage 3a chronic kidney disease (HCC) Solitary kidney Stable  Monitor lab each cycle making sure no significant change to suggest immune related toxicity History of pulmonary embolism Continue on eliquis anticoagulation Bleeding precautions No bleeding    Marcus Sartorius, MD  INTERVAL HISTORY: he returns for treatment follow-up. No diarrhea, nausea, vomiting, mouth pain, bleeding, bloody stool, bloody urine, headache, focal weakness.  He is eating more vegetables and cut out meats. He feels better.   Oncology History  Clear cell carcinoma of kidney (HCC)  03/11/2022 Imaging   CTA Extensive right-sided pulmonary emboli with evidence of right  heart strain.   Pleural based somewhat rounded areas of increased airspace opacity in the right lower lobe likely representing early changes of pulmonary infarct.   Changes consistent with the known history of right renal cell carcinoma.   05/10/2022 Imaging   CTA 1. Significantly decreased though not entirely resolved clot burden in the right lung with persistent small volume mural adherent clot. 2. Linear opacities in the right lower lobe may reflect atelectasis or developing scar. 3. Right upper pole renal mass again seen, incompletely imaged. 4. Cholelithiasis.   06/18/2022 Pathology Results   A. KIDNEY, RIGHT AND PERICAVAL LYMPH NODES, RADICAL NEPHRECTOMY:       Clear cell renal cell carcinoma, WHO / ISUP grade 3 (of 4).       Tumor size: 14.5 x 12 x 8.5 cm.       Tumor necrosis identified accounting for approximately 15% of  parenchyma.      Carcinoma disrupts capsule and invades into perinephric tissue,  adrenal parenchyma, renal vein and renal sinus.       Renal vein margin is positive for carcinoma.  Ureteral and vascular margins are negative for carcinoma.  Separated tumor satellite focus (0.5 cm).  Background renal parenchyma with focal interstitial inflammation without  glomerulosclerosis, tubal atrophy or vasculopathy.  See oncology table.   Procedure: Nephrectomy  Specimen Laterality: Right  Tumor Size: 14.5 x 12 x 8.5 cm  Tumor Focality: One focus of mail tumor (14.5 cm) with one fucus of  tumor satellite nodule (0.5 cm)  Histologic Type: Clear cell renal cell carcinoma  Sarcomatoid Features: Not identified  Rhabdoid Features: Not identified  Histologic Grade: Grade  3  Tumor Necrosis: present, 15% of the parenchymal volume  Tumor Extension: and invades into perinephric tissue, adrenal  parenchyma, renal vein and renal sinus.  Lymphatic and/or Vascular Invasion:  Not identified  Margins: Renal vein margin is positive for tumor  Regional Lymph Nodes:        Number of Lymph Nodes with Tumor: 0            Number of Lymph Nodes Examined: 1  Distant Metastasis:       Distant Site(s) Involved: Not applicable  Additional Findings in Nonneoplastic Kidney: Focal interstitial  inflammation without glomerulosclerosis, tubal atrophy or vasculopathy  Pathologic Stage Classification (pTNM, AJCC 8th Edition): pT4, pN0    07/16/2022 Initial Diagnosis   Clear cell carcinoma of kidney (HCC)   08/11/2022 -  Chemotherapy   Received first dose of pembrolizumab Patient is on Treatment Plan : RENAL Pembrolizumab (200) q21d      10/12/2022 Imaging   Presented with right sided flank pain.  CT AP 1. Status post right nephrectomy and right adrenalectomy. Poorly defined soft tissue density in the expected location of right renal fossa with extensive ill-defined nodular soft tissue thickening in the right retroperitoneum, concerning for residual or recurrent tumor and metastatic disease. 2. Enlarged liver with multiple hypodense areas suspicious for metastatic disease. Suspect large metastatic mass involving the caudate lobe of liver with more focal hypodensity in the vicinity of the IVC, probably related to tumor thrombus. 3. Triangular shaped gas and fluid collection either within the right hepatic lobe or abutting its margin given appearance on postoperative CT from June. This area measures roughly 5.4 by 4.5 cm and is indeterminate for necrotic tumor or infected fluid collection. 4. Small volume abdominopelvic ascites, new compared to prior exam. 5. Extensive right-sided predominant peritoneal metastatic disease. Small to moderate volume of pelvic ascites. 6. Interim finding of the new 4 mm left lung base pulmonary nodule, suspicious for a metastatic nodule. 7. Small right-sided pleural effusion with right middle lobe and right lower lobe atelectasis. 8. Gallstones. Nonobstructing left kidney stones.   10/14/2022 -  Hospital Admission   Seen while  inpatient findings of metastatic disease.    10/15/2022 -  Chemotherapy   Patient is on Treatment Plan : Lenvatinib 20 mg daily RENAL Pembrolizumab (200) q21d     01/18/2023 Cancer Staging   Staging form: Kidney, AJCC 8th Edition - Clinical: Stage IV (cT4, cN0, cM1) - Signed by Marcus Sartorius, MD on 01/18/2023 Histologic grade (G): G3 Histologic grading system: 4 grade system      PHYSICAL EXAMINATION: ECOG PERFORMANCE STATUS: 0 - Asymptomatic  Vitals:   04/08/23 1346  BP: 126/89  Pulse: 67  Resp: 20  Temp: 97.6 F (36.4 C)  SpO2: 100%   Filed Weights   04/08/23 1346  Weight: 277 lb 9.6 oz (125.9 kg)   No distress Abdomen soft, nontender and nondistended No edema Strength equal bilaterally.  Relevant data reviewed during this visit included labs.

## 2023-04-08 NOTE — Assessment & Plan Note (Addendum)
 Solitary kidney Stable  Monitor lab each cycle making sure no significant change to suggest immune related toxicity

## 2023-04-08 NOTE — Assessment & Plan Note (Addendum)
 10/15/22 started Lenvatinib 20 mg once daily Continue pembro every 3 weeks         10/28/22 MRI of brian. results negative for brain metastases Referral made for genetic testing Monitor for new symptoms Will plan on new CT about end of May

## 2023-04-08 NOTE — Assessment & Plan Note (Addendum)
 BP 130's at home mostly Continue telmisartan 40 mg daily.  Continue metoprolol 50 mg daily. Monitor BP daily at rest.

## 2023-04-14 ENCOUNTER — Encounter: Payer: BC Managed Care – PPO | Admitting: Genetic Counselor

## 2023-04-19 ENCOUNTER — Other Ambulatory Visit: Payer: Self-pay

## 2023-04-29 ENCOUNTER — Telehealth: Payer: Self-pay

## 2023-04-29 ENCOUNTER — Inpatient Hospital Stay

## 2023-04-29 ENCOUNTER — Other Ambulatory Visit

## 2023-04-29 ENCOUNTER — Inpatient Hospital Stay: Payer: BC Managed Care – PPO

## 2023-04-29 VITALS — BP 131/91 | HR 70 | Temp 98.1°F | Resp 18 | Wt 266.0 lb

## 2023-04-29 DIAGNOSIS — C641 Malignant neoplasm of right kidney, except renal pelvis: Secondary | ICD-10-CM

## 2023-04-29 DIAGNOSIS — N1831 Chronic kidney disease, stage 3a: Secondary | ICD-10-CM

## 2023-04-29 DIAGNOSIS — Z86711 Personal history of pulmonary embolism: Secondary | ICD-10-CM

## 2023-04-29 DIAGNOSIS — I159 Secondary hypertension, unspecified: Secondary | ICD-10-CM | POA: Diagnosis not present

## 2023-04-29 DIAGNOSIS — Z5112 Encounter for antineoplastic immunotherapy: Secondary | ICD-10-CM | POA: Diagnosis not present

## 2023-04-29 LAB — CMP (CANCER CENTER ONLY)
ALT: 16 U/L (ref 0–44)
AST: 79 U/L — ABNORMAL HIGH (ref 15–41)
Albumin: 4 g/dL (ref 3.5–5.0)
Alkaline Phosphatase: 83 U/L (ref 38–126)
Anion gap: 5 (ref 5–15)
BUN: 13 mg/dL (ref 6–20)
CO2: 27 mmol/L (ref 22–32)
Calcium: 9.3 mg/dL (ref 8.9–10.3)
Chloride: 110 mmol/L (ref 98–111)
Creatinine: 1.41 mg/dL — ABNORMAL HIGH (ref 0.61–1.24)
GFR, Estimated: >60 mL/min (ref >60)
Glucose, Bld: 82 mg/dL (ref 70–99)
Potassium: 4.1 mmol/L (ref 3.5–5.1)
Sodium: 142 mmol/L (ref 135–145)
Total Bilirubin: 0.8 mg/dL (ref 0.0–1.2)
Total Protein: 7.1 g/dL (ref 6.5–8.1)

## 2023-04-29 LAB — CBC WITH DIFFERENTIAL (CANCER CENTER ONLY)
Abs Immature Granulocytes: 0.01 10*3/uL (ref 0.00–0.07)
Basophils Absolute: 0 10*3/uL (ref 0.0–0.1)
Basophils Relative: 0 %
Eosinophils Absolute: 1.4 10*3/uL — ABNORMAL HIGH (ref 0.0–0.5)
Eosinophils Relative: 26 %
HCT: 40.2 % (ref 39.0–52.0)
Hemoglobin: 12.9 g/dL — ABNORMAL LOW (ref 13.0–17.0)
Immature Granulocytes: 0 %
Lymphocytes Relative: 27 %
Lymphs Abs: 1.4 10*3/uL (ref 0.7–4.0)
MCH: 29.3 pg (ref 26.0–34.0)
MCHC: 32.1 g/dL (ref 30.0–36.0)
MCV: 91.4 fL (ref 80.0–100.0)
Monocytes Absolute: 0.3 10*3/uL (ref 0.1–1.0)
Monocytes Relative: 6 %
Neutro Abs: 2.2 10*3/uL (ref 1.7–7.7)
Neutrophils Relative %: 41 %
Platelet Count: 215 10*3/uL (ref 150–400)
RBC: 4.4 MIL/uL (ref 4.22–5.81)
RDW: 15.7 % — ABNORMAL HIGH (ref 11.5–15.5)
WBC Count: 5.3 10*3/uL (ref 4.0–10.5)
nRBC: 0 % (ref 0.0–0.2)

## 2023-04-29 LAB — T4, FREE: Free T4: 0.95 ng/dL (ref 0.61–1.12)

## 2023-04-29 LAB — TSH: TSH: 11.3 u[IU]/mL — ABNORMAL HIGH (ref 0.350–4.500)

## 2023-04-29 MED ORDER — SODIUM CHLORIDE 0.9 % IV SOLN
200.0000 mg | Freq: Once | INTRAVENOUS | Status: AC
  Administered 2023-04-29: 200 mg via INTRAVENOUS
  Filled 2023-04-29: qty 200

## 2023-04-29 MED ORDER — SODIUM CHLORIDE 0.9 % IV SOLN: Freq: Once | INTRAVENOUS | Status: AC

## 2023-04-29 MED ORDER — HEPARIN SOD (PORK) LOCK FLUSH 100 UNIT/ML IV SOLN: 500.0000 [IU] | Freq: Once | INTRAVENOUS | Status: DC | PRN

## 2023-04-29 MED ORDER — SODIUM CHLORIDE 0.9% FLUSH: 10.0000 mL | INTRAVENOUS | Status: DC | PRN

## 2023-04-29 NOTE — Telephone Encounter (Signed)
 Marcus Burns sated that he needs an earlier appointment today because he has to pick his daughter up from school. I informed Marcus Burns that I will have to ask and return his call as soon as I receive a response from the Nurses in charge.

## 2023-04-29 NOTE — Assessment & Plan Note (Addendum)
 BP 130's at home mostly Continue telmisartan 40 mg daily.  Continue metoprolol 50 mg daily. Monitor BP daily at rest.

## 2023-04-29 NOTE — Assessment & Plan Note (Addendum)
 10/15/22 started Lenvatinib  20 mg once daily Continue pembro every 3 weeks         10/28/22 MRI of brian. results negative for brain metastases Monitor for new symptoms Will plan on new CT about end of May

## 2023-04-29 NOTE — Assessment & Plan Note (Addendum)
 Continue on eliquis anticoagulation Bleeding precautions No bleeding

## 2023-04-29 NOTE — Assessment & Plan Note (Addendum)
 Solitary kidney Stable  Monitor lab each cycle making sure no significant change to suggest immune related toxicity

## 2023-04-29 NOTE — Progress Notes (Signed)
 [facility] OFFICE PROGRESS NOTE  Patient Care Team: Roslyn Coombe, MD as PCP - General  Marcus Burns is a 43 year old pleasant male with history of right renal cell carcinoma with metastatic disease here for follow up. Imaging showed peritoneal carcinomatosis, pulmonary nodules, liver metastases.   Overall tolerating well. Clinically with response. Hypertension from TKI report better controlled at home.   Diagnosis: mRCC Treatment:  10/15/2022 lenvatinib . 20 mg daily with pembrolizumab  Assessment & Plan Clear cell carcinoma of right kidney (HCC) 10/15/22 started Lenvatinib  20 mg once daily Continue pembro every 3 weeks         10/28/22 MRI of brian. results negative for brain metastases Monitor for new symptoms Will plan on new CT about end of May  Stage 3a chronic kidney disease (HCC) Solitary kidney Stable  Monitor lab each cycle making sure no significant change to suggest immune related toxicity Secondary hypertension BP 130's at home mostly Continue telmisartan  40 mg daily.  Continue metoprolol  50 mg daily. Monitor BP daily at rest.  History of pulmonary embolism Continue on eliquis  anticoagulation Bleeding precautions No bleeding    [name], MD  INTERVAL HISTORY: Patient returns for follow-up. Overall feeling well. No coughing, short of breath, stomach pain.  Appetite is good. A few diarrhea without need for medication. No bleeding, bloody stool or urine.  Oncology History  Clear cell carcinoma of kidney (HCC)  03/11/2022 Imaging   CTA Extensive right-sided pulmonary emboli with evidence of right heart strain.   Pleural based somewhat rounded areas of increased airspace opacity in the right lower lobe likely representing early changes of pulmonary infarct.   Changes consistent with the known history of right renal cell carcinoma.   05/10/2022 Imaging   CTA 1. Significantly decreased though not entirely resolved clot burden in the right lung  with persistent small volume mural adherent clot. 2. Linear opacities in the right lower lobe may reflect atelectasis or developing scar. 3. Right upper pole renal mass again seen, incompletely imaged. 4. Cholelithiasis.   06/18/2022 Pathology Results   A. KIDNEY, RIGHT AND PERICAVAL LYMPH NODES, RADICAL NEPHRECTOMY:       Clear cell renal cell carcinoma, WHO / ISUP grade 3 (of 4).       Tumor size: 14.5 x 12 x 8.5 cm.       Tumor necrosis identified accounting for approximately 15% of  parenchyma.      Carcinoma disrupts capsule and invades into perinephric tissue,  adrenal parenchyma, renal vein and renal sinus.       Renal vein margin is positive for carcinoma.  Ureteral and vascular margins are negative for carcinoma.  Separated tumor satellite focus (0.5 cm).  Background renal parenchyma with focal interstitial inflammation without  glomerulosclerosis, tubal atrophy or vasculopathy.  See oncology table.   Procedure: Nephrectomy  Specimen Laterality: Right  Tumor Size: 14.5 x 12 x 8.5 cm  Tumor Focality: One focus of mail tumor (14.5 cm) with one fucus of  tumor satellite nodule (0.5 cm)  Histologic Type: Clear cell renal cell carcinoma  Sarcomatoid Features: Not identified  Rhabdoid Features: Not identified  Histologic Grade: Grade 3  Tumor Necrosis: present, 15% of the parenchymal volume  Tumor Extension: and invades into perinephric tissue, adrenal  parenchyma, renal vein and renal sinus.  Lymphatic and/or Vascular Invasion:  Not identified  Margins: Renal vein margin is positive for tumor  Regional Lymph Nodes:       Number of Lymph Nodes with Tumor: 0  Number of Lymph Nodes Examined: 1  Distant Metastasis:       Distant Site(s) Involved: Not applicable  Additional Findings in Nonneoplastic Kidney: Focal interstitial  inflammation without glomerulosclerosis, tubal atrophy or vasculopathy  Pathologic Stage Classification (pTNM, AJCC 8th Edition): pT4, pN0     07/16/2022 Initial Diagnosis   Clear cell carcinoma of kidney (HCC)   08/11/2022 -  Chemotherapy   Received first dose of pembrolizumab  Patient is on Treatment Plan : RENAL Pembrolizumab  (200) q21d      10/12/2022 Imaging   Presented with right sided flank pain.  CT AP 1. Status post right nephrectomy and right adrenalectomy. Poorly defined soft tissue density in the expected location of right renal fossa with extensive ill-defined nodular soft tissue thickening in the right retroperitoneum, concerning for residual or recurrent tumor and metastatic disease. 2. Enlarged liver with multiple hypodense areas suspicious for metastatic disease. Suspect large metastatic mass involving the caudate lobe of liver with more focal hypodensity in the vicinity of the IVC, probably related to tumor thrombus. 3. Triangular shaped gas and fluid collection either within the right hepatic lobe or abutting its margin given appearance on postoperative CT from June. This area measures roughly 5.4 by 4.5 cm and is indeterminate for necrotic tumor or infected fluid collection. 4. Small volume abdominopelvic ascites, new compared to prior exam. 5. Extensive right-sided predominant peritoneal metastatic disease. Small to moderate volume of pelvic ascites. 6. Interim finding of the new 4 mm left lung base pulmonary nodule, suspicious for a metastatic nodule. 7. Small right-sided pleural effusion with right middle lobe and right lower lobe atelectasis. 8. Gallstones. Nonobstructing left kidney stones.   10/14/2022 -  Hospital Admission   Seen while inpatient findings of metastatic disease.    10/15/2022 -  Chemotherapy   Patient is on Treatment Plan : Lenvatinib  20 mg daily RENAL Pembrolizumab  (200) q21d     01/18/2023 Cancer Staging   Staging form: Kidney, AJCC 8th Edition - Clinical: Stage IV (cT4, cN0, cM1) - Signed by [name], MD on 01/18/2023 Histologic grade (G): G3 Histologic grading  system: 4 grade system      PHYSICAL EXAMINATION: ECOG PERFORMANCE STATUS: 0 - Asymptomatic VSS  GENERAL: alert, no distress and comfortable SKIN: skin color normal  EYES:  sclera clear LUNGS: clear to auscultation and percussion with normal breathing effort HEART: regular rate & rhythm  ABDOMEN: abdomen soft, non-tender and nondistended. Musculoskeletal: no edema  Relevant data reviewed during this visit included labs.

## 2023-04-29 NOTE — Patient Instructions (Signed)

## 2023-04-29 NOTE — Telephone Encounter (Signed)
 Marcus Burns rescheduled his appointment for today due to a conflict with his schedule. Marcus Burns provided an earlier time slot for Marcus Burns to come in for his infusion appointment. Marcus Burns has been made aware of his appointment details.

## 2023-05-05 ENCOUNTER — Telehealth: Payer: Self-pay

## 2023-05-05 NOTE — Telephone Encounter (Signed)
 Marcus Burns scheduled his upcoming appointments and stated that he needs morning appointments so that he can pick up his child from school.

## 2023-05-06 ENCOUNTER — Other Ambulatory Visit: Payer: Self-pay

## 2023-05-18 ENCOUNTER — Other Ambulatory Visit: Payer: Self-pay | Admitting: *Deleted

## 2023-05-18 MED ORDER — APIXABAN 5 MG PO TABS: 5.0000 mg | ORAL_TABLET | Freq: Two times a day (BID) | ORAL | 6 refills | Status: DC

## 2023-05-19 NOTE — Assessment & Plan Note (Addendum)
 BP 130's at home mostly Continue telmisartan  40 mg daily.  Continue metoprolol  50 mg daily. Monitor BP daily at rest.

## 2023-05-19 NOTE — Assessment & Plan Note (Signed)
 10/15/22 started Lenvatinib  20 mg once daily Continue pembro every 3 weeks         10/28/22 MRI of brian. results negative for brain metastases Monitor for new symptoms Will plan on new CT about end of May

## 2023-05-19 NOTE — Progress Notes (Unsigned)
 Marcus Burns OFFICE PROGRESS NOTE  Patient Care Team: Roslyn Coombe, MD as PCP - General  Marcus Burns is a 43 year old pleasant male with history of right renal cell carcinoma with metastatic disease here for follow up. Imaging showed peritoneal carcinomatosis, pulmonary nodules, liver metastases.    Overall tolerating well. Clinically with response. Hypertension from TKI report better controlled at home.    Diagnosis: mRCC Treatment:  10/15/2022 lenvatinib . 20 mg daily with pembrolizumab  Assessment & Plan Secondary hypertension BP 130's at home mostly Continue telmisartan  40 mg daily.  Continue metoprolol  50 mg daily. Monitor BP daily at rest.  History of pulmonary embolism Continue on eliquis  anticoagulation Bleeding precautions No bleeding  No orders of the defined types were placed in this encounter.    Marcus Ruddy, MD  INTERVAL HISTORY: Patient returns for follow-up.  Oncology History  Clear cell carcinoma of kidney (HCC)  03/11/2022 Imaging   CTA Extensive right-sided pulmonary emboli with evidence of right heart strain.   Pleural based somewhat rounded areas of increased airspace opacity in the right lower lobe likely representing early changes of pulmonary infarct.   Changes consistent with the known history of right renal cell carcinoma.   05/10/2022 Imaging   CTA 1. Significantly decreased though not entirely resolved clot burden in the right lung with persistent small volume mural adherent clot. 2. Linear opacities in the right lower lobe may reflect atelectasis or developing scar. 3. Right upper pole renal mass again seen, incompletely imaged. 4. Cholelithiasis.   06/18/2022 Pathology Results   A. KIDNEY, RIGHT AND PERICAVAL LYMPH NODES, RADICAL NEPHRECTOMY:       Clear cell renal cell carcinoma, WHO / ISUP grade 3 (of 4).       Tumor size: 14.5 x 12 x 8.5 cm.       Tumor necrosis identified accounting for approximately 15% of  parenchyma.       Carcinoma disrupts capsule and invades into perinephric tissue,  adrenal parenchyma, renal vein and renal sinus.       Renal vein margin is positive for carcinoma.  Ureteral and vascular margins are negative for carcinoma.  Separated tumor satellite focus (0.5 cm).  Background renal parenchyma with focal interstitial inflammation without  glomerulosclerosis, tubal atrophy or vasculopathy.  See oncology table.   Procedure: Nephrectomy  Specimen Laterality: Right  Tumor Size: 14.5 x 12 x 8.5 cm  Tumor Focality: One focus of mail tumor (14.5 cm) with one fucus of  tumor satellite nodule (0.5 cm)  Histologic Type: Clear cell renal cell carcinoma  Sarcomatoid Features: Not identified  Rhabdoid Features: Not identified  Histologic Grade: Grade 3  Tumor Necrosis: present, 15% of the parenchymal volume  Tumor Extension: and invades into perinephric tissue, adrenal  parenchyma, renal vein and renal sinus.  Lymphatic and/or Vascular Invasion:  Not identified  Margins: Renal vein margin is positive for tumor  Regional Lymph Nodes:       Number of Lymph Nodes with Tumor: 0            Number of Lymph Nodes Examined: 1  Distant Metastasis:       Distant Site(s) Involved: Not applicable  Additional Findings in Nonneoplastic Kidney: Focal interstitial  inflammation without glomerulosclerosis, tubal atrophy or vasculopathy  Pathologic Stage Classification (pTNM, AJCC 8th Edition): pT4, pN0    07/16/2022 Initial Diagnosis   Clear cell carcinoma of kidney (HCC)   08/11/2022 -  Chemotherapy   Received first dose of pembrolizumab  Patient is on Treatment  Plan : RENAL Pembrolizumab  (200) q21d      10/12/2022 Imaging   Presented with right sided flank pain.  CT AP 1. Status post right nephrectomy and right adrenalectomy. Poorly defined soft tissue density in the expected location of right renal fossa with extensive ill-defined nodular soft tissue thickening in the right retroperitoneum,  concerning for residual or recurrent tumor and metastatic disease. 2. Enlarged liver with multiple hypodense areas suspicious for metastatic disease. Suspect large metastatic mass involving the caudate lobe of liver with more focal hypodensity in the vicinity of the IVC, probably related to tumor thrombus. 3. Triangular shaped gas and fluid collection either within the right hepatic lobe or abutting its margin given appearance on postoperative CT from June. This area measures roughly 5.4 by 4.5 cm and is indeterminate for necrotic tumor or infected fluid collection. 4. Small volume abdominopelvic ascites, new compared to prior exam. 5. Extensive right-sided predominant peritoneal metastatic disease. Small to moderate volume of pelvic ascites. 6. Interim finding of the new 4 mm left lung base pulmonary nodule, suspicious for a metastatic nodule. 7. Small right-sided pleural effusion with right middle lobe and right lower lobe atelectasis. 8. Gallstones. Nonobstructing left kidney stones.   10/14/2022 -  Hospital Admission   Seen while inpatient findings of metastatic disease.    10/15/2022 -  Chemotherapy   Patient is on Treatment Plan : Lenvatinib  20 mg daily RENAL Pembrolizumab  (200) q21d     01/18/2023 Cancer Staging   Staging form: Kidney, AJCC 8th Edition - Clinical: Stage IV (cT4, cN0, cM1) - Signed by Marcus Ruddy, MD on 01/18/2023 Histologic grade (G): G3 Histologic grading system: 4 grade system      PHYSICAL EXAMINATION: ECOG PERFORMANCE STATUS: {CHL ONC ECOG [phone]}  There were no vitals filed for this visit. There were no vitals filed for this visit.  GENERAL: alert, no distress and comfortable SKIN: skin color normal and no bruising or petechiae or jaundice on exposed skin EYES: normal, sclera clear OROPHARYNX: no exudate  NECK: No palpable mass LYMPH:  no palpable cervical, axillary lymphadenopathy  LUNGS: clear to auscultation and percussion with  normal breathing effort HEART: regular rate & rhythm  ABDOMEN: abdomen soft, non-tender and nondistended. Musculoskeletal: no edema NEURO: no focal motor/sensory deficits  Relevant data reviewed during this visit included ***

## 2023-05-19 NOTE — Assessment & Plan Note (Addendum)
 Continue on eliquis anticoagulation Bleeding precautions No bleeding

## 2023-05-19 NOTE — Assessment & Plan Note (Signed)
 Monitor BP at home daily and in clinic after 1 week, then every 2 weeks for 2 months, and at least monthly thereafter.  LFTs (at baseline, every 2 weeks for 2 months, and at least monthly thereafter) BMP, fT4, TSH levels at baseline and monthly or as clinically indicated monitor for proteinuria at baseline and periodically during treatment (urine dipstick; if 2+ then obtain a 24-hour urine protein).  Baseline ECG (10/15/22 Qtc 443) and Echo.  Repeat ECG on 11/26 showed Qtc 432 ms. ECG in select patients (congenital long QT syndrome, heart failure, bradyarrhythmias, or in those on concomitant medications known to prolong the QT interval).  Monitor for clinical signs/symptoms of cardiac dysfunction, arterial thrombosis, reversible posterior leukoencephalopathy syndrome (confirm with MRI), fistula formation, GI perforation, bleeding/hemorrhagic events, diarrhea, dehydration, and wound healing complications. Dental exam periodically during treatment.

## 2023-05-20 ENCOUNTER — Ambulatory Visit: Payer: BC Managed Care – PPO

## 2023-05-20 ENCOUNTER — Other Ambulatory Visit: Payer: BC Managed Care – PPO

## 2023-05-20 ENCOUNTER — Inpatient Hospital Stay (HOSPITAL_BASED_OUTPATIENT_CLINIC_OR_DEPARTMENT_OTHER)

## 2023-05-20 ENCOUNTER — Inpatient Hospital Stay: Attending: Hematology and Oncology

## 2023-05-20 ENCOUNTER — Inpatient Hospital Stay

## 2023-05-20 VITALS — BP 133/92 | HR 78 | Temp 97.8°F | Resp 14 | Wt 256.2 lb

## 2023-05-20 DIAGNOSIS — C641 Malignant neoplasm of right kidney, except renal pelvis: Secondary | ICD-10-CM

## 2023-05-20 DIAGNOSIS — I159 Secondary hypertension, unspecified: Secondary | ICD-10-CM | POA: Insufficient documentation

## 2023-05-20 DIAGNOSIS — Z7901 Long term (current) use of anticoagulants: Secondary | ICD-10-CM | POA: Diagnosis not present

## 2023-05-20 DIAGNOSIS — Z79620 Long term (current) use of immunosuppressive biologic: Secondary | ICD-10-CM | POA: Insufficient documentation

## 2023-05-20 DIAGNOSIS — Z5112 Encounter for antineoplastic immunotherapy: Secondary | ICD-10-CM | POA: Insufficient documentation

## 2023-05-20 DIAGNOSIS — Z86711 Personal history of pulmonary embolism: Secondary | ICD-10-CM | POA: Insufficient documentation

## 2023-05-20 DIAGNOSIS — Z9189 Other specified personal risk factors, not elsewhere classified: Secondary | ICD-10-CM | POA: Diagnosis not present

## 2023-05-20 LAB — CBC WITH DIFFERENTIAL (CANCER CENTER ONLY)
Abs Immature Granulocytes: 0 10*3/uL (ref 0.00–0.07)
Basophils Absolute: 0 10*3/uL (ref 0.0–0.1)
Basophils Relative: 1 %
Eosinophils Absolute: 0.6 10*3/uL — ABNORMAL HIGH (ref 0.0–0.5)
Eosinophils Relative: 13 %
HCT: 38.5 % — ABNORMAL LOW (ref 39.0–52.0)
Hemoglobin: 13 g/dL (ref 13.0–17.0)
Immature Granulocytes: 0 %
Lymphocytes Relative: 30 %
Lymphs Abs: 1.3 10*3/uL (ref 0.7–4.0)
MCH: 30.3 pg (ref 26.0–34.0)
MCHC: 33.8 g/dL (ref 30.0–36.0)
MCV: 89.7 fL (ref 80.0–100.0)
Monocytes Absolute: 0.3 10*3/uL (ref 0.1–1.0)
Monocytes Relative: 7 %
Neutro Abs: 2.1 10*3/uL (ref 1.7–7.7)
Neutrophils Relative %: 49 %
Platelet Count: 190 10*3/uL (ref 150–400)
RBC: 4.29 MIL/uL (ref 4.22–5.81)
RDW: 15.3 % (ref 11.5–15.5)
WBC Count: 4.3 10*3/uL (ref 4.0–10.5)
nRBC: 0 % (ref 0.0–0.2)

## 2023-05-20 LAB — CMP (CANCER CENTER ONLY)
ALT: 15 U/L (ref 0–44)
AST: 62 U/L — ABNORMAL HIGH (ref 15–41)
Albumin: 3.8 g/dL (ref 3.5–5.0)
Alkaline Phosphatase: 75 U/L (ref 38–126)
Anion gap: 5 (ref 5–15)
BUN: 15 mg/dL (ref 6–20)
CO2: 27 mmol/L (ref 22–32)
Calcium: 9.1 mg/dL (ref 8.9–10.3)
Chloride: 110 mmol/L (ref 98–111)
Creatinine: 1.52 mg/dL — ABNORMAL HIGH (ref 0.61–1.24)
GFR, Estimated: 58 mL/min — ABNORMAL LOW (ref >60)
Glucose, Bld: 89 mg/dL (ref 70–99)
Potassium: 3.9 mmol/L (ref 3.5–5.1)
Sodium: 142 mmol/L (ref 135–145)
Total Bilirubin: 0.9 mg/dL (ref 0.0–1.2)
Total Protein: 6.9 g/dL (ref 6.5–8.1)

## 2023-05-20 LAB — TSH: TSH: 14.7 u[IU]/mL — ABNORMAL HIGH (ref 0.350–4.500)

## 2023-05-20 LAB — T4, FREE: Free T4: 0.92 ng/dL (ref 0.61–1.12)

## 2023-05-20 MED ORDER — SODIUM CHLORIDE 0.9 % IV SOLN: Freq: Once | INTRAVENOUS | Status: AC

## 2023-05-20 MED ORDER — SODIUM CHLORIDE 0.9 % IV SOLN
200.0000 mg | Freq: Once | INTRAVENOUS | Status: AC
  Administered 2023-05-20: 200 mg via INTRAVENOUS
  Filled 2023-05-20: qty 200

## 2023-05-20 NOTE — Patient Instructions (Signed)

## 2023-05-22 ENCOUNTER — Other Ambulatory Visit: Payer: Self-pay

## 2023-05-24 ENCOUNTER — Other Ambulatory Visit: Payer: Self-pay

## 2023-05-25 ENCOUNTER — Other Ambulatory Visit: Payer: Self-pay

## 2023-06-10 ENCOUNTER — Inpatient Hospital Stay: Attending: Hematology and Oncology

## 2023-06-10 ENCOUNTER — Ambulatory Visit

## 2023-06-10 ENCOUNTER — Ambulatory Visit: Admitting: Physician Assistant

## 2023-06-10 ENCOUNTER — Other Ambulatory Visit

## 2023-06-10 ENCOUNTER — Inpatient Hospital Stay

## 2023-06-10 ENCOUNTER — Inpatient Hospital Stay (HOSPITAL_BASED_OUTPATIENT_CLINIC_OR_DEPARTMENT_OTHER): Admitting: Physician Assistant

## 2023-06-10 VITALS — BP 121/99 | HR 76 | Temp 97.3°F | Resp 18 | Wt 260.9 lb

## 2023-06-10 DIAGNOSIS — Z5112 Encounter for antineoplastic immunotherapy: Secondary | ICD-10-CM

## 2023-06-10 DIAGNOSIS — Z79899 Other long term (current) drug therapy: Secondary | ICD-10-CM | POA: Diagnosis not present

## 2023-06-10 DIAGNOSIS — C641 Malignant neoplasm of right kidney, except renal pelvis: Secondary | ICD-10-CM | POA: Diagnosis not present

## 2023-06-10 DIAGNOSIS — N1832 Chronic kidney disease, stage 3b: Secondary | ICD-10-CM | POA: Insufficient documentation

## 2023-06-10 DIAGNOSIS — C786 Secondary malignant neoplasm of retroperitoneum and peritoneum: Secondary | ICD-10-CM | POA: Insufficient documentation

## 2023-06-10 DIAGNOSIS — I10 Essential (primary) hypertension: Secondary | ICD-10-CM | POA: Insufficient documentation

## 2023-06-10 DIAGNOSIS — Z79620 Long term (current) use of immunosuppressive biologic: Secondary | ICD-10-CM | POA: Insufficient documentation

## 2023-06-10 DIAGNOSIS — Z86711 Personal history of pulmonary embolism: Secondary | ICD-10-CM | POA: Insufficient documentation

## 2023-06-10 DIAGNOSIS — Z7901 Long term (current) use of anticoagulants: Secondary | ICD-10-CM | POA: Insufficient documentation

## 2023-06-10 LAB — CBC WITH DIFFERENTIAL (CANCER CENTER ONLY)
Abs Immature Granulocytes: 0 10*3/uL (ref 0.00–0.07)
Basophils Absolute: 0 10*3/uL (ref 0.0–0.1)
Basophils Relative: 0 %
Eosinophils Absolute: 0.4 10*3/uL (ref 0.0–0.5)
Eosinophils Relative: 9 %
HCT: 38.6 % — ABNORMAL LOW (ref 39.0–52.0)
Hemoglobin: 12.8 g/dL — ABNORMAL LOW (ref 13.0–17.0)
Immature Granulocytes: 0 %
Lymphocytes Relative: 26 %
Lymphs Abs: 1.1 10*3/uL (ref 0.7–4.0)
MCH: 30.3 pg (ref 26.0–34.0)
MCHC: 33.2 g/dL (ref 30.0–36.0)
MCV: 91.3 fL (ref 80.0–100.0)
Monocytes Absolute: 0.3 10*3/uL (ref 0.1–1.0)
Monocytes Relative: 7 %
Neutro Abs: 2.4 10*3/uL (ref 1.7–7.7)
Neutrophils Relative %: 58 %
Platelet Count: 242 10*3/uL (ref 150–400)
RBC: 4.23 MIL/uL (ref 4.22–5.81)
RDW: 14.6 % (ref 11.5–15.5)
WBC Count: 4.2 10*3/uL (ref 4.0–10.5)
nRBC: 0 % (ref 0.0–0.2)

## 2023-06-10 LAB — CMP (CANCER CENTER ONLY)
ALT: 12 U/L (ref 0–44)
AST: 53 U/L — ABNORMAL HIGH (ref 15–41)
Albumin: 3.6 g/dL (ref 3.5–5.0)
Alkaline Phosphatase: 73 U/L (ref 38–126)
Anion gap: 6 (ref 5–15)
BUN: 13 mg/dL (ref 6–20)
CO2: 28 mmol/L (ref 22–32)
Calcium: 9 mg/dL (ref 8.9–10.3)
Chloride: 108 mmol/L (ref 98–111)
Creatinine: 1.35 mg/dL — ABNORMAL HIGH (ref 0.61–1.24)
GFR, Estimated: >60 mL/min (ref >60)
Glucose, Bld: 87 mg/dL (ref 70–99)
Potassium: 4 mmol/L (ref 3.5–5.1)
Sodium: 142 mmol/L (ref 135–145)
Total Bilirubin: 0.8 mg/dL (ref 0.0–1.2)
Total Protein: 6.8 g/dL (ref 6.5–8.1)

## 2023-06-10 LAB — T4, FREE: Free T4: 0.87 ng/dL (ref 0.61–1.12)

## 2023-06-10 LAB — TSH: TSH: 21.8 u[IU]/mL — ABNORMAL HIGH (ref 0.350–4.500)

## 2023-06-10 MED ORDER — SODIUM CHLORIDE 0.9 % IV SOLN: Freq: Once | INTRAVENOUS | Status: AC

## 2023-06-10 MED ORDER — SODIUM CHLORIDE 0.9 % IV SOLN
200.0000 mg | Freq: Once | INTRAVENOUS | Status: AC
  Administered 2023-06-10: 200 mg via INTRAVENOUS
  Filled 2023-06-10: qty 200

## 2023-06-10 NOTE — Progress Notes (Signed)
 [facility] OFFICE PROGRESS NOTE  Patient Care Team: Roslyn Coombe, MD as PCP - General   Oncology History  Clear cell carcinoma of kidney [facility])  03/11/2022 Imaging   CTA Extensive right-sided pulmonary emboli with evidence of right heart strain.   Pleural based somewhat rounded areas of increased airspace opacity in the right lower lobe likely representing early changes of pulmonary infarct.   Changes consistent with the known history of right renal cell carcinoma.   05/10/2022 Imaging   CTA 1. Significantly decreased though not entirely resolved clot burden in the right lung with persistent small volume mural adherent clot. 2. Linear opacities in the right lower lobe may reflect atelectasis or developing scar. 3. Right upper pole renal mass again seen, incompletely imaged. 4. Cholelithiasis.   06/18/2022 Pathology Results   A. KIDNEY, RIGHT AND PERICAVAL LYMPH NODES, RADICAL NEPHRECTOMY:       Clear cell renal cell carcinoma, WHO / ISUP grade 3 (of 4).       Tumor size: 14.5 x 12 x 8.5 cm.       Tumor necrosis identified accounting for approximately 15% of  parenchyma.      Carcinoma disrupts capsule and invades into perinephric tissue,  adrenal parenchyma, renal vein and renal sinus.       Renal vein margin is positive for carcinoma.  Ureteral and vascular margins are negative for carcinoma.  Separated tumor satellite focus (0.5 cm).  Background renal parenchyma with focal interstitial inflammation without  glomerulosclerosis, tubal atrophy or vasculopathy.  See oncology table.   Procedure: Nephrectomy  Specimen Laterality: Right  Tumor Size: 14.5 x 12 x 8.5 cm  Tumor Focality: One focus of mail tumor (14.5 cm) with one fucus of  tumor satellite nodule (0.5 cm)  Histologic Type: Clear cell renal cell carcinoma  Sarcomatoid Features: Not identified  Rhabdoid Features: Not identified  Histologic Grade: Grade 3  Tumor Necrosis: present, 15% of the  parenchymal volume  Tumor Extension: and invades into perinephric tissue, adrenal  parenchyma, renal vein and renal sinus.  Lymphatic and/or Vascular Invasion:  Not identified  Margins: Renal vein margin is positive for tumor  Regional Lymph Nodes:       Number of Lymph Nodes with Tumor: 0            Number of Lymph Nodes Examined: 1  Distant Metastasis:       Distant Site(s) Involved: Not applicable  Additional Findings in Nonneoplastic Kidney: Focal interstitial  inflammation without glomerulosclerosis, tubal atrophy or vasculopathy  Pathologic Stage Classification (pTNM, AJCC 8th Edition): pT4, pN0    07/16/2022 Initial Diagnosis   Clear cell carcinoma of kidney (HCC)   08/11/2022 -  Chemotherapy   Received first dose of pembrolizumab  Patient is on Treatment Plan : RENAL Pembrolizumab  (200) q21d      10/12/2022 Imaging   Presented with right sided flank pain.  CT AP 1. Status post right nephrectomy and right adrenalectomy. Poorly defined soft tissue density in the expected location of right renal fossa with extensive ill-defined nodular soft tissue thickening in the right retroperitoneum, concerning for residual or recurrent tumor and metastatic disease. 2. Enlarged liver with multiple hypodense areas suspicious for metastatic disease. Suspect large metastatic mass involving the caudate lobe of liver with more focal hypodensity in the vicinity of the IVC, probably related to tumor thrombus. 3. Triangular shaped gas and fluid collection either within the right hepatic lobe or abutting its margin given appearance on postoperative CT from  June. This area measures roughly 5.4 by 4.5 cm and is indeterminate for necrotic tumor or infected fluid collection. 4. Small volume abdominopelvic ascites, new compared to prior exam. 5. Extensive right-sided predominant peritoneal metastatic disease. Small to moderate volume of pelvic ascites. 6. Interim finding of the new 4 mm left lung base  pulmonary nodule, suspicious for a metastatic nodule. 7. Small right-sided pleural effusion with right middle lobe and right lower lobe atelectasis. 8. Gallstones. Nonobstructing left kidney stones.   10/14/2022 -  Hospital Admission   Seen while inpatient findings of metastatic disease.    10/15/2022 -  Chemotherapy   Patient is on Treatment Plan : Lenvatinib  20 mg daily RENAL Pembrolizumab  (200) q21d     01/18/2023 Cancer Staging   Staging form: Kidney, AJCC 8th Edition - Clinical: Stage IV (cT4, cN0, cM1) - Signed by [name], MD on 01/18/2023 Histologic grade (G): G3 Histologic grading system: 4 grade system     INTERVAL HISTORY: Marcus Burns returns for a follow up prior to Cycle 15, Day 1 of Pembrolizumab  q 21 days and Lenvatinib  20 mg PO daily. He was last seen by Dr. Alita Irwin on 05/20/2023. He is unaccompanied for this visit.   Marcus Burns reports he is feeling well and he continues to stay active.  He denies any appetite or weight changes.  He denies nausea, vomiting or bowel habit changes.  He denies easy bruising or signs of active bleeding.  He denies fevers, chills, night sweats, shortness of breath, chest pain or cough.  He has no other complaints.  Rest of the 10 point ROS as below.   Current Outpatient Medications:    acetaminophen  (TYLENOL ) 500 MG tablet, Take 1 tablet (500 mg total) by mouth every 6 (six) hours as needed for moderate pain (pain.)., Disp: , Rfl:    albuterol  (VENTOLIN  HFA) 108 (90 Base) MCG/ACT inhaler, Inhale 2 puffs into the lungs every 6 (six) hours as needed for wheezing or shortness of breath., Disp: 8 g, Rfl: 1   apixaban  (ELIQUIS ) 5 MG TABS tablet, Take 1 tablet (5 mg total) by mouth 2 (two) times daily., Disp: 60 tablet, Rfl: 6   cetirizine  (ZYRTEC ) 10 MG tablet, Take 10 mg by mouth daily., Disp: , Rfl:    docusate sodium  (COLACE) 100 MG capsule, Take 1 capsule (100 mg total) by mouth 2 (two) times daily. (Patient taking differently:  Take 100 mg by mouth as needed for mild constipation or moderate constipation.), Disp: , Rfl:    iron  polysaccharides (NU-IRON ) 150 MG capsule, Take 1 capsule (150 mg total) by mouth daily. (Patient taking differently: Take 150 mg by mouth every other day.), Disp: 90 capsule, Rfl: 1   lenvatinib  20 mg daily dose (LENVIMA ) 2 x 10 MG capsule, Take 2 capsules (20 mg total) by mouth daily., Disp: 60 capsule, Rfl: 11   metoprolol  succinate (TOPROL -XL) 50 MG 24 hr tablet, Take 1 tablet (50 mg total) by mouth daily. Take with or immediately following a meal., Disp: 90 tablet, Rfl: 3   ondansetron  (ZOFRAN ) 8 MG tablet, Take 1 tablet (8 mg total) by mouth every 8 (eight) hours as needed for nausea or vomiting., Disp: 30 tablet, Rfl: 1   prochlorperazine  (COMPAZINE ) 10 MG tablet, Take 1 tablet (10 mg total) by mouth every 6 (six) hours as needed for nausea or vomiting., Disp: 30 tablet, Rfl: 1   telmisartan  (MICARDIS ) 40 MG tablet, Take 1 tablet (40 mg total) by mouth daily., Disp: 90 tablet,  Rfl: 1  Past Medical History:  Diagnosis Date   Acute prostatitis 06/19/2007   ALLERGIC RHINITIS 06/19/2007   ANXIETY 06/19/2007   Cancer (HCC)    kidney   ELEVATED BLOOD PRESSURE WITHOUT DIAGNOSIS OF HYPERTENSION 06/19/2007   GERD 06/19/2007   GERD (gastroesophageal reflux disease)    History of COVID-19 01/24/2019   History of kidney stones    Hypertension    Inguinal hernia    Migraines    NEPHROLITHIASIS, HX OF 06/19/2007   PE (pulmonary thromboembolism) (HCC)    Past Surgical History:  Procedure Laterality Date   CYSTOSCOPY WITH RETROGRADE PYELOGRAM, URETEROSCOPY AND STENT PLACEMENT Bilateral 02/09/2019   Procedure: CYSTOSCOPY WITH RETROGRADE PYELOGRAM, DIAGNOSTIC URETEROSCOPY AND STENT PLACEMENT;  Surgeon: Osborn Blaze, MD;  Location: [facility];  Service: Urology;  Laterality: Bilateral;  75 MINS   CYSTOSCOPY WITH RETROGRADE PYELOGRAM, URETEROSCOPY AND STENT PLACEMENT Bilateral 02/23/2019   Procedure:  CYSTOSCOPY WITH RETROGRADE PYELOGRAM, URETEROSCOPY AND STENT PLACEMENT;  Surgeon: Osborn Blaze, MD;  Location: [facility];  Service: Urology;  Laterality: Bilateral;  1 HR   HERNIA REPAIR     HOLMIUM LASER APPLICATION Bilateral 02/23/2019   Procedure: HOLMIUM LASER APPLICATION;  Surgeon: Osborn Blaze, MD;  Location: [facility];  Service: Urology;  Laterality: Bilateral;   ROBOT ASSISTED LAPAROSCOPIC NEPHRECTOMY Right 06/18/2022   Procedure: XI ROBOTIC ASSISTED LAPAROSCOPIC RADICAL NEPHRECTOMY AND PERICAVAL LYMPH NODE DISSECTION;  Surgeon: Melody Spurling., MD;  Location: [facility];  Service: Urology;  Laterality: Right;  3 HRS   Family History  Problem Relation Age of Onset   Coronary artery disease Father    Anxiety disorder Mother    Anxiety disorder Brother    Diabetes Brother    Social History   Socioeconomic History   Marital status: Married    Spouse name: Not on file   Number of children: Not on file   Years of education: Not on file   Highest education level: Not on file  Occupational History   Not on file  Tobacco Use   Smoking status: Former   Smokeless tobacco: Former  Building services engineer status: Never Used  Substance and Sexual Activity   Alcohol use: No   Drug use: No   Sexual activity: Not on file  Other Topics Concern   Not on file  Social History Narrative   Not on file   Social Drivers of Health   Financial Resource Strain: Not on file  Food Insecurity: No Food Insecurity (10/13/2022)   Hunger Vital Sign    Worried About Running Out of Food in the Last Year: Never true    Ran Out of Food in the Last Year: Never true  Transportation Needs: No Transportation Needs (10/13/2022)   PRAPARE - Administrator, Civil Service (Medical): No    Lack of Transportation (Non-Medical): No  Physical Activity: Not on file  Stress: Not on file  Social Connections: Not on file   REVIEW OF SYSTEMS:   Constitutional: Negative for appetite change, fatigue,  chills, fever and unexpected weight change HENT: Negative for mouth sores, nosebleeds, sore throat and trouble swallowing.   Eyes: Negative for eye problems and icterus.  Respiratory: Negative for cough, hemoptysis, shortness of breath and wheezing.   Cardiovascular: Negative for chest pain and leg swelling.  Gastrointestinal: Negative for abdominal pain, constipation, diarrhea, nausea and vomiting.  Genitourinary: Negative for bladder incontinence, difficulty urinating, dysuria, frequency and hematuria.   Musculoskeletal: Negative for back pain, gait  problem, neck pain and neck stiffness.  Skin:Negative for rash and ulcers Neurological: Negative for dizziness, extremity weakness, gait problem, headaches, light-headedness and seizures.  Hematological: Negative for adenopathy. Does not bruise/bleed easily.  Psychiatric/Behavioral: Negative for confusion, depression and sleep disturbance. The patient is not nervous/anxious.     PHYSICAL EXAMINATION: ECOG PERFORMANCE STATUS: 0 - Asymptomatic  Vitals:   06/10/23 0930 06/10/23 0931  BP: (!) 132/100 (!) 121/99  Pulse: 76   Resp: 18   Temp: (!) 97.3 F (36.3 C)   SpO2: 98%    Filed Weights   06/10/23 0930  Weight: 260 lb 14.4 oz (118.3 kg)    GENERAL: alert, no distress and comfortable SKIN: skin color normal and no bruising or petechiae or jaundice on exposed skin EYES: normal, sclera clear OROPHARYNX: no exudate  NECK: No palpable mass LYMPH:  no palpable cervical, axillary lymphadenopathy  LUNGS: clear to auscultation and percussion with normal breathing effort HEART: regular rate & rhythm  ABDOMEN: abdomen soft, non-tender and nondistended. Musculoskeletal: no edema NEURO: no focal motor/sensory deficits  ASSESSMENT AND PLAN: Marcus Burns is a 43 y.o. male who presents for a follow-up for history of metastatic clear cell carcinoma.   # Metastatic clear cell carcinoma: --Started pembrolizumab  on 08/11/2022. --Found to  have metastatic disease on CT imaging from 10/12/2022. --Started lenvatinib  20 mg p.o. daily on 10/15/2022. PLAN: --Due for cycle 15, day 1 of pembrolizumab . --Labs from today were reviewed and adequate for treatment.  WBC 4.2, hemoglobin 12.8, platelets 243, creatinine stable at 1.35, LFTs stable with AST 53, ALT normal.  Thyroid  panel is pending. --Proceed with treatment without any dose modifications. --Continue with lenvatinib  therapy. --Return to clinic in 3 weeks for labs and toxicity check prior to cycle 16, day 1 of pembrolizumab   #Hypertension: --Continue telmisartan  40 mg daily.  --Continue metoprolol  50 mg daily. --Monitor BP daily at rest.  Average systolic blood pressure at home is 120s to 130s.  # History of pulmonary embolism: -- Currently on anticoagulation with Eliquis . --Strict precautions for bleeding given.   Patient stressed understanding and satisfaction with the plan provided.   I have spent a total of 30 minutes minutes of face-to-face and non-face-to-face time, preparing to see the patient,  performing a medically appropriate examination, counseling and educating the patient,  documenting clinical information in the electronic health record, independently interpreting results and communicating results to the patient, and care coordination.   Wyline Hearing PA-C Dept of Hematology and Oncology [facility] Phone: [phone]

## 2023-06-10 NOTE — Patient Instructions (Signed)
 [facility]  Discharge Instructions: Thank you for choosing [facility] to provide your oncology and hematology care.   If you have a lab appointment with the Cancer Center, please go directly to the Cancer Center and check in at the registration area.   Wear comfortable clothing and clothing appropriate for easy access to any Portacath or PICC line.   We strive to give you quality time with your provider. You may need to reschedule your appointment if you arrive late (15 or more minutes).  Arriving late affects you and other patients whose appointments are after yours.  Also, if you miss three or more appointments without notifying the office, you may be dismissed from the clinic at the provider's discretion.      For prescription refill requests, have your pharmacy contact our office and allow 72 hours for refills to be completed.    Today you received the following chemotherapy and/or immunotherapy agents Rande Lawman      To help prevent nausea and vomiting after your treatment, we encourage you to take your nausea medication as directed.  BELOW ARE SYMPTOMS THAT SHOULD BE REPORTED IMMEDIATELY: *FEVER GREATER THAN 100.4 F (38 C) OR HIGHER *CHILLS OR SWEATING *NAUSEA AND VOMITING THAT IS NOT CONTROLLED WITH YOUR NAUSEA MEDICATION *UNUSUAL SHORTNESS OF BREATH *UNUSUAL BRUISING OR BLEEDING *URINARY PROBLEMS (pain or burning when urinating, or frequent urination) *BOWEL PROBLEMS (unusual diarrhea, constipation, pain near the anus) TENDERNESS IN MOUTH AND THROAT WITH OR WITHOUT PRESENCE OF ULCERS (sore throat, sores in mouth, or a toothache) UNUSUAL RASH, SWELLING OR PAIN  UNUSUAL VAGINAL DISCHARGE OR ITCHING   Items with * indicate a potential emergency and should be followed up as soon as possible or go to the Emergency Department if any problems should occur.  Please show the CHEMOTHERAPY ALERT CARD or IMMUNOTHERAPY  ALERT CARD at check-in to the Emergency Department and triage nurse.  Should you have questions after your visit or need to cancel or reschedule your appointment, please contact [facility]  Dept: [phone]  and follow the prompts.  Office hours are 8:00 a.m. to 4:30 p.m. Monday - Friday. Please note that voicemails left after 4:00 p.m. may not be returned until the following business day.  We are closed weekends and major holidays. You have access to a nurse at all times for urgent questions. Please call the main number to the clinic Dept: [phone] and follow the prompts.   For any non-urgent questions, you may also contact your provider using MyChart. We now offer e-Visits for anyone 85 and older to request care online for non-urgent symptoms. For details visit [email or web address].   Also download the MyChart app! Go to the app store, search "MyChart", open the app, select , and log in with your MyChart username and password.

## 2023-06-11 ENCOUNTER — Other Ambulatory Visit: Payer: Self-pay

## 2023-06-13 NOTE — Telephone Encounter (Signed)
 Was discontinued on 02/15/23

## 2023-06-26 ENCOUNTER — Other Ambulatory Visit: Payer: Self-pay

## 2023-07-01 ENCOUNTER — Ambulatory Visit

## 2023-07-01 ENCOUNTER — Other Ambulatory Visit

## 2023-07-04 ENCOUNTER — Inpatient Hospital Stay (HOSPITAL_BASED_OUTPATIENT_CLINIC_OR_DEPARTMENT_OTHER)

## 2023-07-04 ENCOUNTER — Inpatient Hospital Stay

## 2023-07-04 VITALS — BP 108/78 | HR 84 | Temp 98.1°F | Resp 16 | Ht 76.0 in | Wt 237.7 lb

## 2023-07-04 DIAGNOSIS — Z86711 Personal history of pulmonary embolism: Secondary | ICD-10-CM

## 2023-07-04 DIAGNOSIS — Z9189 Other specified personal risk factors, not elsewhere classified: Secondary | ICD-10-CM | POA: Diagnosis not present

## 2023-07-04 DIAGNOSIS — N1832 Chronic kidney disease, stage 3b: Secondary | ICD-10-CM | POA: Diagnosis not present

## 2023-07-04 DIAGNOSIS — C641 Malignant neoplasm of right kidney, except renal pelvis: Secondary | ICD-10-CM | POA: Diagnosis not present

## 2023-07-04 DIAGNOSIS — Z5112 Encounter for antineoplastic immunotherapy: Secondary | ICD-10-CM | POA: Diagnosis not present

## 2023-07-04 DIAGNOSIS — K521 Toxic gastroenteritis and colitis: Secondary | ICD-10-CM | POA: Insufficient documentation

## 2023-07-04 LAB — CMP (CANCER CENTER ONLY)
ALT: 13 U/L (ref 0–44)
AST: 64 U/L — ABNORMAL HIGH (ref 15–41)
Albumin: 3.7 g/dL (ref 3.5–5.0)
Alkaline Phosphatase: 76 U/L (ref 38–126)
Anion gap: 6 (ref 5–15)
BUN: 18 mg/dL (ref 6–20)
CO2: 21 mmol/L — ABNORMAL LOW (ref 22–32)
Calcium: 9.1 mg/dL (ref 8.9–10.3)
Chloride: 114 mmol/L — ABNORMAL HIGH (ref 98–111)
Creatinine: 1.41 mg/dL — ABNORMAL HIGH (ref 0.61–1.24)
GFR, Estimated: >60 mL/min (ref >60)
Glucose, Bld: 91 mg/dL (ref 70–99)
Potassium: 4.1 mmol/L (ref 3.5–5.1)
Sodium: 141 mmol/L (ref 135–145)
Total Bilirubin: 1.1 mg/dL (ref 0.0–1.2)
Total Protein: 6.8 g/dL (ref 6.5–8.1)

## 2023-07-04 LAB — CBC WITH DIFFERENTIAL (CANCER CENTER ONLY)
Abs Immature Granulocytes: 0.01 10*3/uL (ref 0.00–0.07)
Basophils Absolute: 0 10*3/uL (ref 0.0–0.1)
Basophils Relative: 0 %
Eosinophils Absolute: 1.5 10*3/uL — ABNORMAL HIGH (ref 0.0–0.5)
Eosinophils Relative: 22 %
HCT: 40.2 % (ref 39.0–52.0)
Hemoglobin: 13.6 g/dL (ref 13.0–17.0)
Immature Granulocytes: 0 %
Lymphocytes Relative: 15 %
Lymphs Abs: 1 10*3/uL (ref 0.7–4.0)
MCH: 30.8 pg (ref 26.0–34.0)
MCHC: 33.8 g/dL (ref 30.0–36.0)
MCV: 91.2 fL (ref 80.0–100.0)
Monocytes Absolute: 0.4 10*3/uL (ref 0.1–1.0)
Monocytes Relative: 6 %
Neutro Abs: 3.7 10*3/uL (ref 1.7–7.7)
Neutrophils Relative %: 57 %
Platelet Count: 222 10*3/uL (ref 150–400)
RBC: 4.41 MIL/uL (ref 4.22–5.81)
RDW: 15.2 % (ref 11.5–15.5)
WBC Count: 6.7 10*3/uL (ref 4.0–10.5)
nRBC: 0 % (ref 0.0–0.2)

## 2023-07-04 LAB — T4, FREE: Free T4: 0.81 ng/dL (ref 0.61–1.12)

## 2023-07-04 LAB — TSH: TSH: 32.6 u[IU]/mL — ABNORMAL HIGH (ref 0.350–4.500)

## 2023-07-04 MED ORDER — SODIUM CHLORIDE 0.9 % IV SOLN
200.0000 mg | Freq: Once | INTRAVENOUS | Status: AC
  Administered 2023-07-04: 200 mg via INTRAVENOUS
  Filled 2023-07-04: qty 200

## 2023-07-04 MED ORDER — SODIUM CHLORIDE 0.9 % IV SOLN: Freq: Once | INTRAVENOUS | Status: AC

## 2023-07-04 NOTE — Progress Notes (Signed)
 [facility] OFFICE PROGRESS NOTE  Patient Care Team: Norleen Lynwood ORN, MD as PCP - General  Marcus Burns is a 43 year old pleasant male with history of right renal cell carcinoma with metastatic disease here for follow up. Imaging showed peritoneal carcinomatosis, pulmonary nodules, liver metastases.    Overall tolerating well. Clinically with response. Hypertension from TKI report better controlled at home.    Diagnosis: mRCC Treatment:  10/15/2022 lenvatinib . 20 mg daily with pembrolizumab    Monthly delivery of lenvatinib  to his house.   Doing well.  Diarrhea is the only side effect.  He feels well and would like to defer CT. Assessment & Plan Clear cell carcinoma of right kidney (HCC) 10/15/22 started Lenvatinib  20 mg once daily Continue pembro every 3 weeks         10/28/22 MRI of brian. results negative for brain metastases Monitor for new symptoms Patient likes to defer new CT to August.   Stage 3b chronic kidney disease (HCC) Solitary kidney Stable  Monitor lab each cycle making sure no significant change to suggest immune related toxicity At risk for side effect of medication Monitor BP at home daily and in clinic after 1 week, then every 2 weeks for 2 months, and at least monthly thereafter.  LFTs (at baseline, every 2 weeks for 2 months, and at least monthly thereafter) BMP, fT4, TSH levels at baseline and monthly or as clinically indicated monitor for proteinuria at baseline and periodically during treatment (urine dipstick; if 2+ then obtain a 24-hour urine protein).  Baseline ECG (10/15/22 Qtc 443) and Echo.  Repeat ECG on 11/26 showed Qtc 432 ms. Monitor for clinical signs/symptoms of cardiac dysfunction, arterial thrombosis, reversible posterior leukoencephalopathy syndrome (confirm with MRI), fistula formation, GI perforation, bleeding/hemorrhagic events, diarrhea, dehydration, and wound healing complications. Dental exam periodically during treatment.   History of pulmonary embolism Continue on eliquis  anticoagulation Bleeding precautions No bleeding Drug-induced diarrhea Imodium as needed.   Marcus JAYSON Chihuahua, MD  INTERVAL HISTORY: Patient returns for follow-up. Report of diarrhea from len about three times a week one to twice a day. Sometimes watery. No stomach pain. No fever, chills, n/v. He picked up imodium.  No mouth pain, joint pain, chest pain, coughing or short of breath. No rash.  No new headaches.  Taking apixaban . No bleeding.  Oncology History  Clear cell carcinoma of kidney (HCC)  03/11/2022 Imaging   CTA Extensive right-sided pulmonary emboli with evidence of right heart strain.   Pleural based somewhat rounded areas of increased airspace opacity in the right lower lobe likely representing early changes of pulmonary infarct.   Changes consistent with the known history of right renal cell carcinoma.   05/10/2022 Imaging   CTA 1. Significantly decreased though not entirely resolved clot burden in the right lung with persistent small volume mural adherent clot. 2. Linear opacities in the right lower lobe may reflect atelectasis or developing scar. 3. Right upper pole renal mass again seen, incompletely imaged. 4. Cholelithiasis.   06/18/2022 Pathology Results   A. KIDNEY, RIGHT AND PERICAVAL LYMPH NODES, RADICAL NEPHRECTOMY:       Clear cell renal cell carcinoma, WHO / ISUP grade 3 (of 4).       Tumor size: 14.5 x 12 x 8.5 cm.       Tumor necrosis identified accounting for approximately 15% of  parenchyma.      Carcinoma disrupts capsule and invades into perinephric tissue,  adrenal parenchyma, renal vein and renal sinus.  Renal vein margin is positive for carcinoma.  Ureteral and vascular margins are negative for carcinoma.  Separated tumor satellite focus (0.5 cm).  Background renal parenchyma with focal interstitial inflammation without  glomerulosclerosis, tubal atrophy or vasculopathy.  See  oncology table.   Procedure: Nephrectomy  Specimen Laterality: Right  Tumor Size: 14.5 x 12 x 8.5 cm  Tumor Focality: One focus of mail tumor (14.5 cm) with one fucus of  tumor satellite nodule (0.5 cm)  Histologic Type: Clear cell renal cell carcinoma  Sarcomatoid Features: Not identified  Rhabdoid Features: Not identified  Histologic Grade: Grade 3  Tumor Necrosis: present, 15% of the parenchymal volume  Tumor Extension: and invades into perinephric tissue, adrenal  parenchyma, renal vein and renal sinus.  Lymphatic and/or Vascular Invasion:  Not identified  Margins: Renal vein margin is positive for tumor  Regional Lymph Nodes:       Number of Lymph Nodes with Tumor: 0            Number of Lymph Nodes Examined: 1  Distant Metastasis:       Distant Site(s) Involved: Not applicable  Additional Findings in Nonneoplastic Kidney: Focal interstitial  inflammation without glomerulosclerosis, tubal atrophy or vasculopathy  Pathologic Stage Classification (pTNM, AJCC 8th Edition): pT4, pN0    07/16/2022 Initial Diagnosis   Clear cell carcinoma of kidney (HCC)   08/11/2022 -  Chemotherapy   Received first dose of pembrolizumab  Patient is on Treatment Plan : RENAL Pembrolizumab  (200) q21d      10/12/2022 Imaging   Presented with right sided flank pain.  CT AP 1. Status post right nephrectomy and right adrenalectomy. Poorly defined soft tissue density in the expected location of right renal fossa with extensive ill-defined nodular soft tissue thickening in the right retroperitoneum, concerning for residual or recurrent tumor and metastatic disease. 2. Enlarged liver with multiple hypodense areas suspicious for metastatic disease. Suspect large metastatic mass involving the caudate lobe of liver with more focal hypodensity in the vicinity of the IVC, probably related to tumor thrombus. 3. Triangular shaped gas and fluid collection either within the right hepatic lobe or abutting its  margin given appearance on postoperative CT from June. This area measures roughly 5.4 by 4.5 cm and is indeterminate for necrotic tumor or infected fluid collection. 4. Small volume abdominopelvic ascites, new compared to prior exam. 5. Extensive right-sided predominant peritoneal metastatic disease. Small to moderate volume of pelvic ascites. 6. Interim finding of the new 4 mm left lung base pulmonary nodule, suspicious for a metastatic nodule. 7. Small right-sided pleural effusion with right middle lobe and right lower lobe atelectasis. 8. Gallstones. Nonobstructing left kidney stones.   10/14/2022 -  Hospital Admission   Seen while inpatient findings of metastatic disease.    10/15/2022 -  Chemotherapy   Patient is on Treatment Plan : Lenvatinib  20 mg daily RENAL Pembrolizumab  (200) q21d     01/18/2023 Cancer Staging   Staging form: Kidney, AJCC 8th Edition - Clinical: Stage IV (cT4, cN0, cM1) - Signed by Tina Marcus BROCKS, MD on 01/18/2023 Histologic grade (G): G3 Histologic grading system: 4 grade system      PHYSICAL EXAMINATION: ECOG PERFORMANCE STATUS: 0 - Asymptomatic  Vitals:   07/04/23 1312  BP: 108/78  Pulse: 84  Resp: 16  Temp: 98.1 F (36.7 C)  SpO2: 100%   Filed Weights   07/04/23 1312  Weight: 237 lb 11.2 oz (107.8 kg)    GENERAL: alert, no distress and comfortable SKIN: skin  color normal and no jaundice on exposed skin EYES: normal, sclera clear OROPHARYNX: no exudate  NECK: No palpable mass LYMPH:  no palpable cervical lymphadenopathy  LUNGS: clear to auscultation and percussion with normal breathing effort HEART: regular rate & rhythm  ABDOMEN: abdomen soft, non-tender and nondistended. Musculoskeletal: no edema NEURO: no focal motor/sensory deficits  Relevant data reviewed during this visit included labs.

## 2023-07-04 NOTE — Assessment & Plan Note (Addendum)
 Imodium as needed.

## 2023-07-04 NOTE — Assessment & Plan Note (Addendum)
 Monitor BP at home daily and in clinic after 1 week, then every 2 weeks for 2 months, and at least monthly thereafter.  LFTs (at baseline, every 2 weeks for 2 months, and at least monthly thereafter) BMP, fT4, TSH levels at baseline and monthly or as clinically indicated monitor for proteinuria at baseline and periodically during treatment (urine dipstick; if 2+ then obtain a 24-hour urine protein).  Baseline ECG (10/15/22 Qtc 443) and Echo.  Repeat ECG on 11/26 showed Qtc 432 ms. Monitor for clinical signs/symptoms of cardiac dysfunction, arterial thrombosis, reversible posterior leukoencephalopathy syndrome (confirm with MRI), fistula formation, GI perforation, bleeding/hemorrhagic events, diarrhea, dehydration, and wound healing complications. Dental exam periodically during treatment.

## 2023-07-04 NOTE — Assessment & Plan Note (Addendum)
 10/15/22 started Lenvatinib  20 mg once daily Continue pembro every 3 weeks         10/28/22 MRI of brian. results negative for brain metastases Monitor for new symptoms Patient likes to defer new CT to August.

## 2023-07-04 NOTE — Assessment & Plan Note (Addendum)
 Solitary kidney Stable  Monitor lab each cycle making sure no significant change to suggest immune related toxicity

## 2023-07-04 NOTE — Assessment & Plan Note (Addendum)
 Continue on eliquis anticoagulation Bleeding precautions No bleeding

## 2023-07-04 NOTE — Patient Instructions (Signed)
 [facility] - A DEPT OF [facility]  Discharge Instructions: Thank you for choosing [facility] to provide your oncology and hematology care.   If you have a lab appointment with the Cancer Center, please go directly to the Cancer Center and check in at the registration area.   Wear comfortable clothing and clothing appropriate for easy access to any Portacath or PICC line.   We strive to give you quality time with your provider. You may need to reschedule your appointment if you arrive late (15 or more minutes).  Arriving late affects you and other patients whose appointments are after yours.  Also, if you miss three or more appointments without notifying the office, you may be dismissed from the clinic at the provider's discretion.      For prescription refill requests, have your pharmacy contact our office and allow 72 hours for refills to be completed.    Today you received the following chemotherapy and/or immunotherapy agents Marcus Burns      To help prevent nausea and vomiting after your treatment, we encourage you to take your nausea medication as directed.  BELOW ARE SYMPTOMS THAT SHOULD BE REPORTED IMMEDIATELY: *FEVER GREATER THAN 100.4 F (38 C) OR HIGHER *CHILLS OR SWEATING *NAUSEA AND VOMITING THAT IS NOT CONTROLLED WITH YOUR NAUSEA MEDICATION *UNUSUAL SHORTNESS OF BREATH *UNUSUAL BRUISING OR BLEEDING *URINARY PROBLEMS (pain or burning when urinating, or frequent urination) *BOWEL PROBLEMS (unusual diarrhea, constipation, pain near the anus) TENDERNESS IN MOUTH AND THROAT WITH OR WITHOUT PRESENCE OF ULCERS (sore throat, sores in mouth, or a toothache) UNUSUAL RASH, SWELLING OR PAIN  UNUSUAL VAGINAL DISCHARGE OR ITCHING   Items with * indicate a potential emergency and should be followed up as soon as possible or go to the Emergency Department if any problems should occur.  Please show the CHEMOTHERAPY ALERT CARD or IMMUNOTHERAPY  ALERT CARD at check-in to the Emergency Department and triage nurse.  Should you have questions after your visit or need to cancel or reschedule your appointment, please contact [facility] - A DEPT OF [facility]  Dept: [phone]  and follow the prompts.  Office hours are 8:00 a.m. to 4:30 p.m. Monday - Friday. Please note that voicemails left after 4:00 p.m. may not be returned until the following business day.  We are closed weekends and major holidays. You have access to a nurse at all times for urgent questions. Please call the main number to the clinic Dept: [phone] and follow the prompts.   For any non-urgent questions, you may also contact your provider using MyChart. We now offer e-Visits for anyone 85 and older to request care online for non-urgent symptoms. For details visit [email or web address].   Also download the MyChart app! Go to the app store, search "MyChart", open the app, select , and log in with your MyChart username and password.

## 2023-07-21 NOTE — Progress Notes (Unsigned)
 [facility] OFFICE PROGRESS NOTE  Patient Care Team: Marcus Burns ORN, MD as PCP - General  Marcus Burns is a 43 year old pleasant male with history of right renal cell carcinoma with metastatic disease here for follow up. Imaging showed peritoneal carcinomatosis, pulmonary nodules, liver metastases.    Overall tolerating well. Clinically with response. Hypertension from TKI report better controlled at home.    Diagnosis: mRCC Treatment:  10/15/2022 lenvatinib . 20 mg daily with pembrolizumab   Monthly delivery of lenvatinib  to his house.   Doing well.   Diarrhea is the only side effect.   He feels well and would like to defer CT. Assessment & Plan Clear cell carcinoma of right kidney (HCC) 10/15/22 started Lenvatinib  20 mg once daily Continue pembro every 3 weeks         10/28/22 MRI of brian. results negative for brain metastases Monitor for new symptoms Patient likes to defer new CT to August.  History of pulmonary embolism Continue on eliquis  anticoagulation Bleeding precautions No bleeding At risk for side effect of medication Monitor BP at home daily and in clinic after 1 week, then every 2 weeks for 2 months, and at least monthly thereafter.  LFTs (at baseline, every 2 weeks for 2 months, and at least monthly thereafter) BMP, fT4, TSH levels at baseline and monthly or as clinically indicated monitor for proteinuria at baseline and periodically during treatment (urine dipstick; if 2+ then obtain a 24-hour urine protein).  Baseline ECG (10/15/22 Qtc 443) and Echo.  Repeat ECG on 11/26 showed Qtc 432 ms. Monitor for clinical signs/symptoms of cardiac dysfunction, arterial thrombosis, reversible posterior leukoencephalopathy syndrome (confirm with MRI), fistula formation, GI perforation, bleeding/hemorrhagic events, diarrhea, dehydration, and wound healing complications. Dental exam periodically during treatment.  Monitor irAE Secondary hypertension  BP 120-130's at  home mostly Continue telmisartan  40 mg daily.  Continue metoprolol  50 mg daily. Monitor BP daily at rest.  Orders Placed This Encounter  Procedures   CT CHEST ABDOMEN PELVIS W CONTRAST    Standing Status:   Future    Expected Date:   08/22/2023    Expiration Date:   07/21/2024    If indicated for the ordered procedure, I authorize the administration of contrast media per Radiology protocol:   Yes    Does the patient have a contrast media/X-ray dye allergy?:   No    Preferred imaging location?:   [facility]    If indicated for the ordered procedure, I authorize the administration of oral contrast media per Radiology protocol:   Yes     [name], MD  INTERVAL HISTORY: Patient returns for follow-up. Appetite is good. No stomach pain, nausea, vomiting. Stool color is normal.  No mass or lump. A few diarrhea but not daily resolved with imodium.  Oncology History  Clear cell carcinoma of kidney (HCC)  03/11/2022 Imaging   CTA Extensive right-sided pulmonary emboli with evidence of right heart strain.   Pleural based somewhat rounded areas of increased airspace opacity in the right lower lobe likely representing early changes of pulmonary infarct.   Changes consistent with the known history of right renal cell carcinoma.   05/10/2022 Imaging   CTA 1. Significantly decreased though not entirely resolved clot burden in the right lung with persistent small volume mural adherent clot. 2. Linear opacities in the right lower lobe may reflect atelectasis or developing scar. 3. Right upper pole renal mass again seen, incompletely imaged. 4. Cholelithiasis.   06/18/2022 Pathology Results  A. KIDNEY, RIGHT AND PERICAVAL LYMPH NODES, RADICAL NEPHRECTOMY:       Clear cell renal cell carcinoma, WHO / ISUP grade 3 (of 4).       Tumor size: 14.5 x 12 x 8.5 cm.       Tumor necrosis identified accounting for approximately 15% of  parenchyma.      Carcinoma disrupts capsule and  invades into perinephric tissue,  adrenal parenchyma, renal vein and renal sinus.       Renal vein margin is positive for carcinoma.  Ureteral and vascular margins are negative for carcinoma.  Separated tumor satellite focus (0.5 cm).  Background renal parenchyma with focal interstitial inflammation without  glomerulosclerosis, tubal atrophy or vasculopathy.  See oncology table.   Procedure: Nephrectomy  Specimen Laterality: Right  Tumor Size: 14.5 x 12 x 8.5 cm  Tumor Focality: One focus of mail tumor (14.5 cm) with one fucus of  tumor satellite nodule (0.5 cm)  Histologic Type: Clear cell renal cell carcinoma  Sarcomatoid Features: Not identified  Rhabdoid Features: Not identified  Histologic Grade: Grade 3  Tumor Necrosis: present, 15% of the parenchymal volume  Tumor Extension: and invades into perinephric tissue, adrenal  parenchyma, renal vein and renal sinus.  Lymphatic and/or Vascular Invasion:  Not identified  Margins: Renal vein margin is positive for tumor  Regional Lymph Nodes:       Number of Lymph Nodes with Tumor: 0            Number of Lymph Nodes Examined: 1  Distant Metastasis:       Distant Site(s) Involved: Not applicable  Additional Findings in Nonneoplastic Kidney: Focal interstitial  inflammation without glomerulosclerosis, tubal atrophy or vasculopathy  Pathologic Stage Classification (pTNM, AJCC 8th Edition): pT4, pN0    07/16/2022 Initial Diagnosis   Clear cell carcinoma of kidney (HCC)   08/11/2022 -  Chemotherapy   Received first dose of pembrolizumab  Patient is on Treatment Plan : RENAL Pembrolizumab  (200) q21d      10/12/2022 Imaging   Presented with right sided flank pain.  CT AP 1. Status post right nephrectomy and right adrenalectomy. Poorly defined soft tissue density in the expected location of right renal fossa with extensive ill-defined nodular soft tissue thickening in the right retroperitoneum, concerning for residual or  recurrent tumor and metastatic disease. 2. Enlarged liver with multiple hypodense areas suspicious for metastatic disease. Suspect large metastatic mass involving the caudate lobe of liver with more focal hypodensity in the vicinity of the IVC, probably related to tumor thrombus. 3. Triangular shaped gas and fluid collection either within the right hepatic lobe or abutting its margin given appearance on postoperative CT from June. This area measures roughly 5.4 by 4.5 cm and is indeterminate for necrotic tumor or infected fluid collection. 4. Small volume abdominopelvic ascites, new compared to prior exam. 5. Extensive right-sided predominant peritoneal metastatic disease. Small to moderate volume of pelvic ascites. 6. Interim finding of the new 4 mm left lung base pulmonary nodule, suspicious for a metastatic nodule. 7. Small right-sided pleural effusion with right middle lobe and right lower lobe atelectasis. 8. Gallstones. Nonobstructing left kidney stones.   10/14/2022 -  Hospital Admission   Seen while inpatient findings of metastatic disease.    10/15/2022 -  Chemotherapy   Patient is on Treatment Plan : Lenvatinib  20 mg daily RENAL Pembrolizumab  (200) q21d     01/18/2023 Cancer Staging   Staging form: Kidney, AJCC 8th Edition - Clinical: Stage IV (cT4, cN0,  cM1) - Signed by Tina Pauletta BROCKS, MD on 01/18/2023 Histologic grade (G): G3 Histologic grading system: 4 grade system      PHYSICAL EXAMINATION: ECOG PERFORMANCE STATUS: 0 - Asymptomatic  Vitals:   07/22/23 0849  BP: 116/76  Pulse: 90  Resp: 17  Temp: 97.7 F (36.5 C)  SpO2: 100%   Filed Weights   07/22/23 0849  Weight: 234 lb (106.1 kg)    GENERAL: alert, no distress and comfortable SKIN: skin color normal and no petechiae or jaundice on exposed skin EYES: normal, sclera clear OROPHARYNX: no exudate  NECK: No palpable mass LYMPH:  no palpable cervical, axillary lymphadenopathy  LUNGS: clear to  auscultation and percussion with normal breathing effort HEART: regular rate & rhythm  ABDOMEN: abdomen soft, non-tender and nondistended. Musculoskeletal: no edema NEURO: no focal motor/sensory deficits  Relevant data reviewed during this visit included labs.

## 2023-07-21 NOTE — Assessment & Plan Note (Addendum)
 Monitor BP at home daily and in clinic after 1 week, then every 2 weeks for 2 months, and at least monthly thereafter.  LFTs (at baseline, every 2 weeks for 2 months, and at least monthly thereafter) BMP, fT4, TSH levels at baseline and monthly or as clinically indicated monitor for proteinuria at baseline and periodically during treatment (urine dipstick; if 2+ then obtain a 24-hour urine protein).  Baseline ECG (10/15/22 Qtc 443) and Echo.  Repeat ECG on 11/26 showed Qtc 432 ms. Monitor for clinical signs/symptoms of cardiac dysfunction, arterial thrombosis, reversible posterior leukoencephalopathy syndrome (confirm with MRI), fistula formation, GI perforation, bleeding/hemorrhagic events, diarrhea, dehydration, and wound healing complications. Dental exam periodically during treatment.  Monitor irAE

## 2023-07-21 NOTE — Assessment & Plan Note (Addendum)
 10/15/22 started Lenvatinib  20 mg once daily Continue pembro every 3 weeks         10/28/22 MRI of brian. results negative for brain metastases Monitor for new symptoms Patient likes to defer new CT to August.

## 2023-07-21 NOTE — Assessment & Plan Note (Addendum)
 Continue on eliquis anticoagulation Bleeding precautions No bleeding

## 2023-07-22 ENCOUNTER — Inpatient Hospital Stay (HOSPITAL_BASED_OUTPATIENT_CLINIC_OR_DEPARTMENT_OTHER)

## 2023-07-22 ENCOUNTER — Inpatient Hospital Stay: Attending: Hematology and Oncology

## 2023-07-22 ENCOUNTER — Inpatient Hospital Stay

## 2023-07-22 VITALS — BP 116/76 | HR 90 | Temp 97.7°F | Resp 17 | Ht 76.0 in | Wt 234.0 lb

## 2023-07-22 DIAGNOSIS — C786 Secondary malignant neoplasm of retroperitoneum and peritoneum: Secondary | ICD-10-CM | POA: Diagnosis not present

## 2023-07-22 DIAGNOSIS — Z86711 Personal history of pulmonary embolism: Secondary | ICD-10-CM | POA: Insufficient documentation

## 2023-07-22 DIAGNOSIS — Z79620 Long term (current) use of immunosuppressive biologic: Secondary | ICD-10-CM | POA: Insufficient documentation

## 2023-07-22 DIAGNOSIS — C787 Secondary malignant neoplasm of liver and intrahepatic bile duct: Secondary | ICD-10-CM | POA: Diagnosis not present

## 2023-07-22 DIAGNOSIS — Z9189 Other specified personal risk factors, not elsewhere classified: Secondary | ICD-10-CM

## 2023-07-22 DIAGNOSIS — C641 Malignant neoplasm of right kidney, except renal pelvis: Secondary | ICD-10-CM | POA: Insufficient documentation

## 2023-07-22 DIAGNOSIS — I159 Secondary hypertension, unspecified: Secondary | ICD-10-CM

## 2023-07-22 DIAGNOSIS — Z7901 Long term (current) use of anticoagulants: Secondary | ICD-10-CM | POA: Insufficient documentation

## 2023-07-22 DIAGNOSIS — Z5112 Encounter for antineoplastic immunotherapy: Secondary | ICD-10-CM | POA: Insufficient documentation

## 2023-07-22 LAB — CBC WITH DIFFERENTIAL (CANCER CENTER ONLY)
Abs Immature Granulocytes: 0.01 K/uL (ref 0.00–0.07)
Basophils Absolute: 0 K/uL (ref 0.0–0.1)
Basophils Relative: 1 %
Eosinophils Absolute: 0.6 K/uL — ABNORMAL HIGH (ref 0.0–0.5)
Eosinophils Relative: 13 %
HCT: 40.2 % (ref 39.0–52.0)
Hemoglobin: 13.2 g/dL (ref 13.0–17.0)
Immature Granulocytes: 0 %
Lymphocytes Relative: 32 %
Lymphs Abs: 1.4 K/uL (ref 0.7–4.0)
MCH: 30.1 pg (ref 26.0–34.0)
MCHC: 32.8 g/dL (ref 30.0–36.0)
MCV: 91.6 fL (ref 80.0–100.0)
Monocytes Absolute: 0.3 K/uL (ref 0.1–1.0)
Monocytes Relative: 7 %
Neutro Abs: 2.1 K/uL (ref 1.7–7.7)
Neutrophils Relative %: 47 %
Platelet Count: 242 K/uL (ref 150–400)
RBC: 4.39 MIL/uL (ref 4.22–5.81)
RDW: 15.3 % (ref 11.5–15.5)
WBC Count: 4.4 K/uL (ref 4.0–10.5)
nRBC: 0 % (ref 0.0–0.2)

## 2023-07-22 LAB — CMP (CANCER CENTER ONLY)
ALT: 14 U/L (ref 0–44)
AST: 58 U/L — ABNORMAL HIGH (ref 15–41)
Albumin: 3.6 g/dL (ref 3.5–5.0)
Alkaline Phosphatase: 72 U/L (ref 38–126)
Anion gap: 6 (ref 5–15)
BUN: 15 mg/dL (ref 6–20)
CO2: 21 mmol/L — ABNORMAL LOW (ref 22–32)
Calcium: 9 mg/dL (ref 8.9–10.3)
Chloride: 112 mmol/L — ABNORMAL HIGH (ref 98–111)
Creatinine: 1.55 mg/dL — ABNORMAL HIGH (ref 0.61–1.24)
GFR, Estimated: 56 mL/min — ABNORMAL LOW (ref >60)
Glucose, Bld: 89 mg/dL (ref 70–99)
Potassium: 4 mmol/L (ref 3.5–5.1)
Sodium: 139 mmol/L (ref 135–145)
Total Bilirubin: 1.5 mg/dL — ABNORMAL HIGH (ref 0.0–1.2)
Total Protein: 6.6 g/dL (ref 6.5–8.1)

## 2023-07-22 LAB — T4, FREE: Free T4: 0.85 ng/dL (ref 0.61–1.12)

## 2023-07-22 LAB — TSH: TSH: 53.5 u[IU]/mL — ABNORMAL HIGH (ref 0.350–4.500)

## 2023-07-22 MED ORDER — SODIUM CHLORIDE 0.9 % IV SOLN
Freq: Once | INTRAVENOUS | Status: AC
Start: 2023-07-22 — End: 2023-07-22

## 2023-07-22 MED ORDER — SODIUM CHLORIDE 0.9 % IV SOLN
200.0000 mg | Freq: Once | INTRAVENOUS | Status: AC
  Administered 2023-07-22: 200 mg via INTRAVENOUS
  Filled 2023-07-22: qty 200

## 2023-07-22 NOTE — Assessment & Plan Note (Addendum)
 BP 130's at home mostly Continue telmisartan  40 mg daily.  Continue metoprolol  50 mg daily. Monitor BP daily at rest.

## 2023-07-23 ENCOUNTER — Other Ambulatory Visit: Payer: Self-pay

## 2023-08-12 ENCOUNTER — Inpatient Hospital Stay

## 2023-08-12 ENCOUNTER — Inpatient Hospital Stay: Admitting: Physician Assistant

## 2023-08-12 ENCOUNTER — Inpatient Hospital Stay: Attending: Hematology and Oncology

## 2023-08-12 VITALS — BP 122/90 | HR 92 | Temp 97.7°F | Resp 15 | Wt 228.2 lb

## 2023-08-12 DIAGNOSIS — Z7901 Long term (current) use of anticoagulants: Secondary | ICD-10-CM | POA: Diagnosis not present

## 2023-08-12 DIAGNOSIS — Z79620 Long term (current) use of immunosuppressive biologic: Secondary | ICD-10-CM | POA: Diagnosis not present

## 2023-08-12 DIAGNOSIS — Z86711 Personal history of pulmonary embolism: Secondary | ICD-10-CM | POA: Insufficient documentation

## 2023-08-12 DIAGNOSIS — I159 Secondary hypertension, unspecified: Secondary | ICD-10-CM | POA: Diagnosis not present

## 2023-08-12 DIAGNOSIS — Z5112 Encounter for antineoplastic immunotherapy: Secondary | ICD-10-CM

## 2023-08-12 DIAGNOSIS — C641 Malignant neoplasm of right kidney, except renal pelvis: Secondary | ICD-10-CM | POA: Insufficient documentation

## 2023-08-12 DIAGNOSIS — R634 Abnormal weight loss: Secondary | ICD-10-CM | POA: Insufficient documentation

## 2023-08-12 DIAGNOSIS — I10 Essential (primary) hypertension: Secondary | ICD-10-CM | POA: Insufficient documentation

## 2023-08-12 DIAGNOSIS — R630 Anorexia: Secondary | ICD-10-CM

## 2023-08-12 DIAGNOSIS — C786 Secondary malignant neoplasm of retroperitoneum and peritoneum: Secondary | ICD-10-CM | POA: Diagnosis not present

## 2023-08-12 DIAGNOSIS — R197 Diarrhea, unspecified: Secondary | ICD-10-CM | POA: Insufficient documentation

## 2023-08-12 DIAGNOSIS — C787 Secondary malignant neoplasm of liver and intrahepatic bile duct: Secondary | ICD-10-CM | POA: Diagnosis not present

## 2023-08-12 LAB — CBC WITH DIFFERENTIAL (CANCER CENTER ONLY)
Abs Immature Granulocytes: 0.01 K/uL (ref 0.00–0.07)
Basophils Absolute: 0 K/uL (ref 0.0–0.1)
Basophils Relative: 1 %
Eosinophils Absolute: 0.4 K/uL (ref 0.0–0.5)
Eosinophils Relative: 11 %
HCT: 43.2 % (ref 39.0–52.0)
Hemoglobin: 14.2 g/dL (ref 13.0–17.0)
Immature Granulocytes: 0 %
Lymphocytes Relative: 30 %
Lymphs Abs: 1.1 K/uL (ref 0.7–4.0)
MCH: 30.7 pg (ref 26.0–34.0)
MCHC: 32.9 g/dL (ref 30.0–36.0)
MCV: 93.3 fL (ref 80.0–100.0)
Monocytes Absolute: 0.2 K/uL (ref 0.1–1.0)
Monocytes Relative: 6 %
Neutro Abs: 1.8 K/uL (ref 1.7–7.7)
Neutrophils Relative %: 52 %
Platelet Count: 211 K/uL (ref 150–400)
RBC: 4.63 MIL/uL (ref 4.22–5.81)
RDW: 15.4 % (ref 11.5–15.5)
WBC Count: 3.5 K/uL — ABNORMAL LOW (ref 4.0–10.5)
nRBC: 0 % (ref 0.0–0.2)

## 2023-08-12 LAB — CMP (CANCER CENTER ONLY)
ALT: 15 U/L (ref 0–44)
AST: 56 U/L — ABNORMAL HIGH (ref 15–41)
Albumin: 3.8 g/dL (ref 3.5–5.0)
Alkaline Phosphatase: 74 U/L (ref 38–126)
Anion gap: 7 (ref 5–15)
BUN: 15 mg/dL (ref 6–20)
CO2: 22 mmol/L (ref 22–32)
Calcium: 8.8 mg/dL — ABNORMAL LOW (ref 8.9–10.3)
Chloride: 110 mmol/L (ref 98–111)
Creatinine: 1.56 mg/dL — ABNORMAL HIGH (ref 0.61–1.24)
GFR, Estimated: 56 mL/min — ABNORMAL LOW (ref >60)
Glucose, Bld: 89 mg/dL (ref 70–99)
Potassium: 3.9 mmol/L (ref 3.5–5.1)
Sodium: 139 mmol/L (ref 135–145)
Total Bilirubin: 1.1 mg/dL (ref 0.0–1.2)
Total Protein: 6.9 g/dL (ref 6.5–8.1)

## 2023-08-12 LAB — T4, FREE: Free T4: 0.78 ng/dL (ref 0.61–1.12)

## 2023-08-12 LAB — TSH: TSH: 70.1 u[IU]/mL — ABNORMAL HIGH (ref 0.350–4.500)

## 2023-08-12 MED ORDER — SODIUM CHLORIDE 0.9 % IV SOLN
200.0000 mg | Freq: Once | INTRAVENOUS | Status: AC
  Administered 2023-08-12: 200 mg via INTRAVENOUS
  Filled 2023-08-12: qty 200

## 2023-08-12 MED ORDER — SODIUM CHLORIDE 0.9 % IV SOLN: Freq: Once | INTRAVENOUS | Status: AC

## 2023-08-12 NOTE — Progress Notes (Signed)
 [facility] OFFICE PROGRESS NOTE  Patient Care Team: Norleen Lynwood ORN, MD as PCP - General   Oncology History  Clear cell carcinoma of kidney [facility])  03/11/2022 Imaging   CTA Extensive right-sided pulmonary emboli with evidence of right heart strain.   Pleural based somewhat rounded areas of increased airspace opacity in the right lower lobe likely representing early changes of pulmonary infarct.   Changes consistent with the known history of right renal cell carcinoma.   05/10/2022 Imaging   CTA 1. Significantly decreased though not entirely resolved clot burden in the right lung with persistent small volume mural adherent clot. 2. Linear opacities in the right lower lobe may reflect atelectasis or developing scar. 3. Right upper pole renal mass again seen, incompletely imaged. 4. Cholelithiasis.   06/18/2022 Pathology Results   A. KIDNEY, RIGHT AND PERICAVAL LYMPH NODES, RADICAL NEPHRECTOMY:       Clear cell renal cell carcinoma, WHO / ISUP grade 3 (of 4).       Tumor size: 14.5 x 12 x 8.5 cm.       Tumor necrosis identified accounting for approximately 15% of  parenchyma.      Carcinoma disrupts capsule and invades into perinephric tissue,  adrenal parenchyma, renal vein and renal sinus.       Renal vein margin is positive for carcinoma.  Ureteral and vascular margins are negative for carcinoma.  Separated tumor satellite focus (0.5 cm).  Background renal parenchyma with focal interstitial inflammation without  glomerulosclerosis, tubal atrophy or vasculopathy.  See oncology table.   Procedure: Nephrectomy  Specimen Laterality: Right  Tumor Size: 14.5 x 12 x 8.5 cm  Tumor Focality: One focus of mail tumor (14.5 cm) with one fucus of  tumor satellite nodule (0.5 cm)  Histologic Type: Clear cell renal cell carcinoma  Sarcomatoid Features: Not identified  Rhabdoid Features: Not identified  Histologic Grade: Grade 3  Tumor Necrosis: present, 15% of the  parenchymal volume  Tumor Extension: and invades into perinephric tissue, adrenal  parenchyma, renal vein and renal sinus.  Lymphatic and/or Vascular Invasion:  Not identified  Margins: Renal vein margin is positive for tumor  Regional Lymph Nodes:       Number of Lymph Nodes with Tumor: 0            Number of Lymph Nodes Examined: 1  Distant Metastasis:       Distant Site(s) Involved: Not applicable  Additional Findings in Nonneoplastic Kidney: Focal interstitial  inflammation without glomerulosclerosis, tubal atrophy or vasculopathy  Pathologic Stage Classification (pTNM, AJCC 8th Edition): pT4, pN0    07/16/2022 Initial Diagnosis   Clear cell carcinoma of kidney (HCC)   08/11/2022 -  Chemotherapy   Received first dose of pembrolizumab  Patient is on Treatment Plan : RENAL Pembrolizumab  (200) q21d      10/12/2022 Imaging   Presented with right sided flank pain.  CT AP 1. Status post right nephrectomy and right adrenalectomy. Poorly defined soft tissue density in the expected location of right renal fossa with extensive ill-defined nodular soft tissue thickening in the right retroperitoneum, concerning for residual or recurrent tumor and metastatic disease. 2. Enlarged liver with multiple hypodense areas suspicious for metastatic disease. Suspect large metastatic mass involving the caudate lobe of liver with more focal hypodensity in the vicinity of the IVC, probably related to tumor thrombus. 3. Triangular shaped gas and fluid collection either within the right hepatic lobe or abutting its margin given appearance on postoperative CT from  June. This area measures roughly 5.4 by 4.5 cm and is indeterminate for necrotic tumor or infected fluid collection. 4. Small volume abdominopelvic ascites, new compared to prior exam. 5. Extensive right-sided predominant peritoneal metastatic disease. Small to moderate volume of pelvic ascites. 6. Interim finding of the new 4 mm left lung base  pulmonary nodule, suspicious for a metastatic nodule. 7. Small right-sided pleural effusion with right middle lobe and right lower lobe atelectasis. 8. Gallstones. Nonobstructing left kidney stones.   10/14/2022 -  Hospital Admission   Seen while inpatient findings of metastatic disease.    10/15/2022 -  Chemotherapy   Patient is on Treatment Plan : Lenvatinib  20 mg daily RENAL Pembrolizumab  (200) q21d     01/18/2023 Cancer Staging   Staging form: Kidney, AJCC 8th Edition - Clinical: Stage IV (cT4, cN0, cM1) - Signed by Tina Pauletta BROCKS, MD on 01/18/2023 Histologic grade (G): G3 Histologic grading system: 4 grade system     INTERVAL HISTORY: [name] returns for a follow up prior to Cycle 18, Day 1 of Pembrolizumab  q 21 days and Lenvatinib  20 mg PO daily. He was last seen by Dr. Tina on7/18/2025. He is unaccompanied for this visit.   [name] reports he is feeling well and is tolerating his treatment without any significant toxicities.  He does have occasional episodes of diarrhea, 1-2 episodes per week.  He takes Imodium as needed with improvement of symptoms.  He has noticed his appetite has decreased with the lenvatinib .  He has lost approximately 6 pounds since last visit.  He is trying to eat small, frequent meals.  He denies nausea, vomiting or abdominal pain.  He denies easy bruising or signs of active bleeding. He denies fevers, chills, night sweats, shortness of breath, chest pain or cough.  He has no other complaints.  Rest of the 10 point ROS as below.   Current Outpatient Medications:    acetaminophen  (TYLENOL ) 500 MG tablet, Take 1 tablet (500 mg total) by mouth every 6 (six) hours as needed for moderate pain (pain.)., Disp: , Rfl:    albuterol  (VENTOLIN  HFA) 108 (90 Base) MCG/ACT inhaler, Inhale 2 puffs into the lungs every 6 (six) hours as needed for wheezing or shortness of breath., Disp: 8 g, Rfl: 1   apixaban  (ELIQUIS ) 5 MG TABS tablet, Take 1 tablet (5 mg  total) by mouth 2 (two) times daily., Disp: 60 tablet, Rfl: 6   cetirizine  (ZYRTEC ) 10 MG tablet, Take 10 mg by mouth daily., Disp: , Rfl:    docusate sodium  (COLACE) 100 MG capsule, Take 1 capsule (100 mg total) by mouth 2 (two) times daily. (Patient taking differently: Take 100 mg by mouth as needed for mild constipation or moderate constipation.), Disp: , Rfl:    lenvatinib  20 mg daily dose (LENVIMA ) 2 x 10 MG capsule, Take 2 capsules (20 mg total) by mouth daily., Disp: 60 capsule, Rfl: 11   loperamide (IMODIUM) 2 MG capsule, Take by mouth as needed for diarrhea or loose stools., Disp: , Rfl:    metoprolol  succinate (TOPROL -XL) 50 MG 24 hr tablet, Take 1 tablet (50 mg total) by mouth daily. Take with or immediately following a meal., Disp: 90 tablet, Rfl: 3   ondansetron  (ZOFRAN ) 8 MG tablet, Take 1 tablet (8 mg total) by mouth every 8 (eight) hours as needed for nausea or vomiting., Disp: 30 tablet, Rfl: 1   prochlorperazine  (COMPAZINE ) 10 MG tablet, Take 1 tablet (10 mg total) by mouth every 6 (  six) hours as needed for nausea or vomiting., Disp: 30 tablet, Rfl: 1   telmisartan  (MICARDIS ) 40 MG tablet, Take 1 tablet (40 mg total) by mouth daily., Disp: 90 tablet, Rfl: 1   iron  polysaccharides (NU-IRON ) 150 MG capsule, Take 1 capsule (150 mg total) by mouth daily. (Patient not taking: Reported on 08/12/2023), Disp: 90 capsule, Rfl: 1  Past Medical History:  Diagnosis Date   Acute prostatitis 06/19/2007   ALLERGIC RHINITIS 06/19/2007   ANXIETY 06/19/2007   Cancer (HCC)    kidney   ELEVATED BLOOD PRESSURE WITHOUT DIAGNOSIS OF HYPERTENSION 06/19/2007   GERD 06/19/2007   GERD (gastroesophageal reflux disease)    History of COVID-19 01/24/2019   History of kidney stones    Hypertension    Inguinal hernia    Migraines    NEPHROLITHIASIS, HX OF 06/19/2007   PE (pulmonary thromboembolism) (HCC)    Past Surgical History:  Procedure Laterality Date   CYSTOSCOPY WITH RETROGRADE PYELOGRAM,  URETEROSCOPY AND STENT PLACEMENT Bilateral 02/09/2019   Procedure: CYSTOSCOPY WITH RETROGRADE PYELOGRAM, DIAGNOSTIC URETEROSCOPY AND STENT PLACEMENT;  Surgeon: Alvaro Hummer, MD;  Location: [facility];  Service: Urology;  Laterality: Bilateral;  75 MINS   CYSTOSCOPY WITH RETROGRADE PYELOGRAM, URETEROSCOPY AND STENT PLACEMENT Bilateral 02/23/2019   Procedure: CYSTOSCOPY WITH RETROGRADE PYELOGRAM, URETEROSCOPY AND STENT PLACEMENT;  Surgeon: Alvaro Hummer, MD;  Location: [facility];  Service: Urology;  Laterality: Bilateral;  1 HR   HERNIA REPAIR     HOLMIUM LASER APPLICATION Bilateral 02/23/2019   Procedure: HOLMIUM LASER APPLICATION;  Surgeon: Alvaro Hummer, MD;  Location: [facility];  Service: Urology;  Laterality: Bilateral;   ROBOT ASSISTED LAPAROSCOPIC NEPHRECTOMY Right 06/18/2022   Procedure: XI ROBOTIC ASSISTED LAPAROSCOPIC RADICAL NEPHRECTOMY AND PERICAVAL LYMPH NODE DISSECTION;  Surgeon: Alvaro Hummer KATHEE Mickey., MD;  Location: [facility];  Service: Urology;  Laterality: Right;  3 HRS   Family History  Problem Relation Age of Onset   Coronary artery disease Father    Anxiety disorder Mother    Anxiety disorder Brother    Diabetes Brother    Social History   Socioeconomic History   Marital status: Married    Spouse name: Not on file   Number of children: Not on file   Years of education: Not on file   Highest education level: Not on file  Occupational History   Not on file  Tobacco Use   Smoking status: Former   Smokeless tobacco: Former  Building services engineer status: Never Used  Substance and Sexual Activity   Alcohol use: No   Drug use: No   Sexual activity: Not on file  Other Topics Concern   Not on file  Social History Narrative   Not on file   Social Drivers of Health   Financial Resource Strain: Not on file  Food Insecurity: No Food Insecurity (10/13/2022)   Hunger Vital Sign    Worried About Running Out of Food in the Last Year: Never true    Ran Out of Food in the Last Year:  Never true  Transportation Needs: No Transportation Needs (10/13/2022)   PRAPARE - Administrator, Civil Service (Medical): No    Lack of Transportation (Non-Medical): No  Physical Activity: Not on file  Stress: Not on file  Social Connections: Not on file   REVIEW OF SYSTEMS:   Constitutional: Negative for appetite change, fatigue, chills, fever and unexpected weight change HENT: Negative for mouth sores, nosebleeds, sore throat and trouble swallowing.  Eyes: Negative for eye problems and icterus.  Respiratory: Negative for cough, hemoptysis, shortness of breath and wheezing.   Cardiovascular: Negative for chest pain and leg swelling.  Gastrointestinal: Negative for abdominal pain, constipation,nausea and vomiting. +diarrhea Genitourinary: Negative for bladder incontinence, difficulty urinating, dysuria, frequency and hematuria.   Musculoskeletal: Negative for back pain, gait problem, neck pain and neck stiffness.  Skin:Negative for rash and ulcers Neurological: Negative for dizziness, extremity weakness, gait problem, headaches, light-headedness and seizures.  Hematological: Negative for adenopathy. Does not bruise/bleed easily.  Psychiatric/Behavioral: Negative for confusion, depression and sleep disturbance. The patient is not nervous/anxious.     PHYSICAL EXAMINATION: ECOG PERFORMANCE STATUS: 0 - Asymptomatic  Vitals:   08/12/23 1050  BP: (!) 122/90  Pulse: 92  Resp: 15  Temp: 97.7 F (36.5 C)  SpO2: 100%    Filed Weights   08/12/23 1050  Weight: 228 lb 3.2 oz (103.5 kg)     GENERAL: alert, no distress and comfortable SKIN: skin color normal and no bruising or petechiae or jaundice on exposed skin EYES: normal, sclera clear LUNGS: clear to auscultation and percussion with normal breathing effort HEART: regular rate & rhythm  Musculoskeletal: no edema NEURO: no focal motor/sensory deficits  ASSESSMENT AND PLAN: Marcus Burns is a 43 y.o. male who  presents for a follow-up for history of metastatic clear cell carcinoma.   # Metastatic clear cell carcinoma: --Started pembrolizumab  on 08/11/2022. --Found to have metastatic disease on CT imaging from 10/12/2022. --Started lenvatinib  20 mg p.o. daily on 10/15/2022. PLAN: --Due for cycle 18, day 1 of pembrolizumab . --Labs from today were reviewed and adequate for treatment.  WBC 3.5, hemoglobin 14.2, platelets 211, creatinine stable at 1.56, AST stable at 56.  T. bili is normal. Thyroid  panel pending. --Proceed with treatment without any dose modifications. --Continue with lenvatinib  therapy. --Return to clinic in 3 weeks for labs and toxicity check prior to cycle 19, day 1 of pembrolizumab   #Appetite loss #Weight loss: -- Encouraged to eat small, frequent meals and supplement with protein shakes as needed. -- Monitor weight loss closely and consider referral to dietitian if he has continued weight loss  #Hypertension: --Continue telmisartan  40 mg daily.  --Continue metoprolol  50 mg daily. --Monitor BP daily at rest.  Average systolic blood pressure at home is 120s to 130s.  # History of pulmonary embolism: -- Currently on anticoagulation with Eliquis . --Strict precautions for bleeding given.   Patient stressed understanding and satisfaction with the plan provided.   I have spent a total of 30 minutes minutes of face-to-face and non-face-to-face time, preparing to see the patient,  performing a medically appropriate examination, counseling and educating the patient,  documenting clinical information in the electronic health record, independently interpreting results and communicating results to the patient, and care coordination.   Johnston Police PA-C Dept of Hematology and Oncology [facility] Phone: [phone]

## 2023-08-12 NOTE — Patient Instructions (Signed)
 [facility]  Discharge Instructions: Thank you for choosing [facility] to provide your oncology and hematology care.   If you have a lab appointment with the Cancer Center, please go directly to the Cancer Center and check in at the registration area.   Wear comfortable clothing and clothing appropriate for easy access to any Portacath or PICC line.   We strive to give you quality time with your provider. You may need to reschedule your appointment if you arrive late (15 or more minutes).  Arriving late affects you and other patients whose appointments are after yours.  Also, if you miss three or more appointments without notifying the office, you may be dismissed from the clinic at the provider's discretion.      For prescription refill requests, have your pharmacy contact our office and allow 72 hours for refills to be completed.    Today you received the following chemotherapy and/or immunotherapy agents Rande Lawman      To help prevent nausea and vomiting after your treatment, we encourage you to take your nausea medication as directed.  BELOW ARE SYMPTOMS THAT SHOULD BE REPORTED IMMEDIATELY: *FEVER GREATER THAN 100.4 F (38 C) OR HIGHER *CHILLS OR SWEATING *NAUSEA AND VOMITING THAT IS NOT CONTROLLED WITH YOUR NAUSEA MEDICATION *UNUSUAL SHORTNESS OF BREATH *UNUSUAL BRUISING OR BLEEDING *URINARY PROBLEMS (pain or burning when urinating, or frequent urination) *BOWEL PROBLEMS (unusual diarrhea, constipation, pain near the anus) TENDERNESS IN MOUTH AND THROAT WITH OR WITHOUT PRESENCE OF ULCERS (sore throat, sores in mouth, or a toothache) UNUSUAL RASH, SWELLING OR PAIN  UNUSUAL VAGINAL DISCHARGE OR ITCHING   Items with * indicate a potential emergency and should be followed up as soon as possible or go to the Emergency Department if any problems should occur.  Please show the CHEMOTHERAPY ALERT CARD or IMMUNOTHERAPY  ALERT CARD at check-in to the Emergency Department and triage nurse.  Should you have questions after your visit or need to cancel or reschedule your appointment, please contact [facility]  Dept: [phone]  and follow the prompts.  Office hours are 8:00 a.m. to 4:30 p.m. Monday - Friday. Please note that voicemails left after 4:00 p.m. may not be returned until the following business day.  We are closed weekends and major holidays. You have access to a nurse at all times for urgent questions. Please call the main number to the clinic Dept: [phone] and follow the prompts.   For any non-urgent questions, you may also contact your provider using MyChart. We now offer e-Visits for anyone 85 and older to request care online for non-urgent symptoms. For details visit [email or web address].   Also download the MyChart app! Go to the app store, search "MyChart", open the app, select , and log in with your MyChart username and password.

## 2023-08-26 ENCOUNTER — Ambulatory Visit (HOSPITAL_COMMUNITY)

## 2023-08-26 ENCOUNTER — Other Ambulatory Visit: Payer: Self-pay

## 2023-08-26 DIAGNOSIS — I159 Secondary hypertension, unspecified: Secondary | ICD-10-CM

## 2023-08-29 ENCOUNTER — Telehealth: Payer: Self-pay | Admitting: *Deleted

## 2023-08-29 ENCOUNTER — Other Ambulatory Visit: Payer: Self-pay | Admitting: *Deleted

## 2023-08-29 ENCOUNTER — Inpatient Hospital Stay (HOSPITAL_BASED_OUTPATIENT_CLINIC_OR_DEPARTMENT_OTHER)

## 2023-08-29 ENCOUNTER — Ambulatory Visit: Payer: Self-pay

## 2023-08-29 ENCOUNTER — Inpatient Hospital Stay

## 2023-08-29 VITALS — BP 115/80 | HR 75 | Temp 97.6°F | Resp 16 | Ht 76.0 in | Wt 219.0 lb

## 2023-08-29 DIAGNOSIS — K521 Toxic gastroenteritis and colitis: Secondary | ICD-10-CM

## 2023-08-29 DIAGNOSIS — R197 Diarrhea, unspecified: Secondary | ICD-10-CM

## 2023-08-29 DIAGNOSIS — C641 Malignant neoplasm of right kidney, except renal pelvis: Secondary | ICD-10-CM

## 2023-08-29 DIAGNOSIS — Z5112 Encounter for antineoplastic immunotherapy: Secondary | ICD-10-CM | POA: Diagnosis not present

## 2023-08-29 LAB — BASIC METABOLIC PANEL - CANCER CENTER ONLY
Anion gap: 8 (ref 5–15)
BUN: 25 mg/dL — ABNORMAL HIGH (ref 6–20)
CO2: 22 mmol/L (ref 22–32)
Calcium: 9 mg/dL (ref 8.9–10.3)
Chloride: 108 mmol/L (ref 98–111)
Creatinine: 2 mg/dL — ABNORMAL HIGH (ref 0.61–1.24)
GFR, Estimated: 41 mL/min — ABNORMAL LOW (ref >60)
Glucose, Bld: 90 mg/dL (ref 70–99)
Potassium: 3.7 mmol/L (ref 3.5–5.1)
Sodium: 138 mmol/L (ref 135–145)

## 2023-08-29 LAB — MAGNESIUM: Magnesium: 1.3 mg/dL — ABNORMAL LOW (ref 1.7–2.4)

## 2023-08-29 MED ORDER — MAGNESIUM SULFATE 2 GM/50ML IV SOLN
2.0000 g | Freq: Once | INTRAVENOUS | Status: AC
  Administered 2023-08-29: 2 g via INTRAVENOUS
  Filled 2023-08-29: qty 50

## 2023-08-29 MED ORDER — SODIUM CHLORIDE 0.9 % IV SOLN: Freq: Once | INTRAVENOUS | Status: AC

## 2023-08-29 NOTE — Telephone Encounter (Signed)
 Notified of message below. States he can come Wednesday. Message to scheduler and [facility]

## 2023-08-29 NOTE — Telephone Encounter (Signed)
 Wife called in follow up to message they sent earlier. Pt has been having diarrhea 3-4 times per day for ~ 1 week. Has taken 1 imodium. Has been trying to push fluids, but feels dehydrated. Is also having hoarseness. Is taking Lenvima .  Coming in today for labs and IVF

## 2023-08-29 NOTE — Patient Instructions (Signed)

## 2023-08-29 NOTE — Telephone Encounter (Signed)
-----   Message from [name] sent at 08/29/2023  4:03 PM EDT ----- Would you see if he can come back for 1L of Normal saline again on Wed or Thursday with BMP and Mg if needed. Thanks.  ----- Message ----- From: Rebecka, Lab [facility] [place] Sent: 08/29/2023  12:26 PM EDT To: [name], MD

## 2023-08-30 ENCOUNTER — Telehealth: Payer: Self-pay | Admitting: *Deleted

## 2023-08-30 NOTE — Telephone Encounter (Signed)
 Spoke with Marcus Burns. He says he has taken the imodium and diarrhea has stopped. Drank 2 bottles of water  this morning and will decrease Lenvima  to 10 mg until he sees Dr Tina on Friday.

## 2023-09-02 ENCOUNTER — Inpatient Hospital Stay

## 2023-09-02 ENCOUNTER — Ambulatory Visit: Admitting: Nutrition

## 2023-09-02 VITALS — HR 85

## 2023-09-02 VITALS — BP 106/72 | HR 86 | Temp 98.0°F | Resp 17 | Ht 76.0 in | Wt 225.6 lb

## 2023-09-02 DIAGNOSIS — I159 Secondary hypertension, unspecified: Secondary | ICD-10-CM

## 2023-09-02 DIAGNOSIS — C641 Malignant neoplasm of right kidney, except renal pelvis: Secondary | ICD-10-CM

## 2023-09-02 DIAGNOSIS — K521 Toxic gastroenteritis and colitis: Secondary | ICD-10-CM

## 2023-09-02 DIAGNOSIS — Z86711 Personal history of pulmonary embolism: Secondary | ICD-10-CM

## 2023-09-02 DIAGNOSIS — Z5112 Encounter for antineoplastic immunotherapy: Secondary | ICD-10-CM | POA: Diagnosis not present

## 2023-09-02 LAB — CMP (CANCER CENTER ONLY)
ALT: 14 U/L (ref 0–44)
AST: 43 U/L — ABNORMAL HIGH (ref 15–41)
Albumin: 3.3 g/dL — ABNORMAL LOW (ref 3.5–5.0)
Alkaline Phosphatase: 76 U/L (ref 38–126)
Anion gap: 9 (ref 5–15)
BUN: 20 mg/dL (ref 6–20)
CO2: 20 mmol/L — ABNORMAL LOW (ref 22–32)
Calcium: 8.7 mg/dL — ABNORMAL LOW (ref 8.9–10.3)
Chloride: 105 mmol/L (ref 98–111)
Creatinine: 1.7 mg/dL — ABNORMAL HIGH (ref 0.61–1.24)
GFR, Estimated: 50 mL/min — ABNORMAL LOW (ref >60)
Glucose, Bld: 94 mg/dL (ref 70–99)
Potassium: 3.7 mmol/L (ref 3.5–5.1)
Sodium: 134 mmol/L — ABNORMAL LOW (ref 135–145)
Total Bilirubin: 1.7 mg/dL — ABNORMAL HIGH (ref 0.0–1.2)
Total Protein: 6.5 g/dL (ref 6.5–8.1)

## 2023-09-02 LAB — CBC WITH DIFFERENTIAL (CANCER CENTER ONLY)
Abs Immature Granulocytes: 0.01 K/uL (ref 0.00–0.07)
Basophils Absolute: 0 K/uL (ref 0.0–0.1)
Basophils Relative: 1 %
Eosinophils Absolute: 0.4 K/uL (ref 0.0–0.5)
Eosinophils Relative: 9 %
HCT: 41.6 % (ref 39.0–52.0)
Hemoglobin: 13.7 g/dL (ref 13.0–17.0)
Immature Granulocytes: 0 %
Lymphocytes Relative: 21 %
Lymphs Abs: 1 K/uL (ref 0.7–4.0)
MCH: 30.7 pg (ref 26.0–34.0)
MCHC: 32.9 g/dL (ref 30.0–36.0)
MCV: 93.3 fL (ref 80.0–100.0)
Monocytes Absolute: 0.4 K/uL (ref 0.1–1.0)
Monocytes Relative: 8 %
Neutro Abs: 3 K/uL (ref 1.7–7.7)
Neutrophils Relative %: 61 %
Platelet Count: 220 K/uL (ref 150–400)
RBC: 4.46 MIL/uL (ref 4.22–5.81)
RDW: 15.3 % (ref 11.5–15.5)
WBC Count: 4.9 K/uL (ref 4.0–10.5)
nRBC: 0 % (ref 0.0–0.2)

## 2023-09-02 LAB — T4, FREE: Free T4: 0.84 ng/dL (ref 0.61–1.12)

## 2023-09-02 LAB — TSH: TSH: 76.4 u[IU]/mL — ABNORMAL HIGH (ref 0.350–4.500)

## 2023-09-02 LAB — MAGNESIUM: Magnesium: 1.5 mg/dL — ABNORMAL LOW (ref 1.7–2.4)

## 2023-09-02 MED ORDER — SODIUM CHLORIDE 0.9 % IV SOLN
200.0000 mg | Freq: Once | INTRAVENOUS | Status: AC
  Administered 2023-09-02: 200 mg via INTRAVENOUS
  Filled 2023-09-02: qty 200

## 2023-09-02 MED ORDER — MAGNESIUM SULFATE 2 GM/50ML IV SOLN
2.0000 g | Freq: Once | INTRAVENOUS | Status: AC
  Administered 2023-09-02: 2 g via INTRAVENOUS
  Filled 2023-09-02: qty 50

## 2023-09-02 MED ORDER — SODIUM CHLORIDE 0.9 % IV SOLN: Freq: Once | INTRAVENOUS | Status: AC

## 2023-09-02 NOTE — Progress Notes (Signed)
 [facility] OFFICE PROGRESS NOTE  Patient Care Team: Norleen Lynwood ORN, MD as PCP - General  Marcus Burns is a 43 year old pleasant male with history of ccRCC with metastatic disease here for follow up. Imaging showed peritoneal carcinomatosis, pulmonary nodules, liver metastases.    Overall tolerating well. Clinically with response. Hypertension from TKI report better controlled at home.    Diagnosis: mRCC with peritoneal carcinomatosis, liver, lung metastases Treatment:  10/15/2022 lenvatinib . 20 mg daily with pembrolizumab   Diarrhea from TKI.  This results in AKI on CKD.  He also has low magnesium  as a result of this.  This is improved after reducing dose of lenvatinib  to 10 mg.  He is also using Imodium as needed.  He is able to keep up with fluid intake.  Will continue therapy. Assessment & Plan Clear cell carcinoma of right kidney (HCC) 10/15/22 started Lenvatinib  20 mg once daily Difficulty tolerating lenvatinib .  Reduced to 10 mg daily. Continue pembro every 3 weeks         10/28/22 MRI of brian. results negative for brain metastases.  No new symptoms Monitor for new symptoms Patient likes to defer new CT to September.  Return for follow-up as scheduled for next cycle Drug-induced diarrhea Diarrhea required IV fluid hydration, worsening renal insufficiency. Repeat labs today showed Reduced dose helped. Continue Imodium as needed 4 times a day History of pulmonary embolism Continue on eliquis  anticoagulation Bleeding precautions No bleeding Secondary hypertension Hypotension today Will hold metoprolol  and telmisartan  today. If SBP is <120 hold both medication. Hypomagnesemia Secondary to diarrhea. 2 g of IV magnesium  sulfate today.    [name], MD  INTERVAL HISTORY: Patient returns for follow-up.  Diarrhea has improved after reducing lenvatinib  to 10 mg daily, along with Imodium as needed.  He has been drinking more water  over the last few days.  He denies  any abdominal pain, distention or palpable mass.  Some lightheadedness he thought from Imodium.  Oncology History  Clear cell carcinoma of kidney (HCC)  03/11/2022 Imaging   CTA Extensive right-sided pulmonary emboli with evidence of right heart strain.   Pleural based somewhat rounded areas of increased airspace opacity in the right lower lobe likely representing early changes of pulmonary infarct.   Changes consistent with the known history of right renal cell carcinoma.   05/10/2022 Imaging   CTA 1. Significantly decreased though not entirely resolved clot burden in the right lung with persistent small volume mural adherent clot. 2. Linear opacities in the right lower lobe may reflect atelectasis or developing scar. 3. Right upper pole renal mass again seen, incompletely imaged. 4. Cholelithiasis.   06/18/2022 Pathology Results   A. KIDNEY, RIGHT AND PERICAVAL LYMPH NODES, RADICAL NEPHRECTOMY:       Clear cell renal cell carcinoma, WHO / ISUP grade 3 (of 4).       Tumor size: 14.5 x 12 x 8.5 cm.       Tumor necrosis identified accounting for approximately 15% of  parenchyma.      Carcinoma disrupts capsule and invades into perinephric tissue,  adrenal parenchyma, renal vein and renal sinus.       Renal vein margin is positive for carcinoma.  Ureteral and vascular margins are negative for carcinoma.  Separated tumor satellite focus (0.5 cm).  Background renal parenchyma with focal interstitial inflammation without  glomerulosclerosis, tubal atrophy or vasculopathy.  See oncology table.   Procedure: Nephrectomy  Specimen Laterality: Right  Tumor Size: 14.5 x 12 x 8.5  cm  Tumor Focality: One focus of mail tumor (14.5 cm) with one fucus of  tumor satellite nodule (0.5 cm)  Histologic Type: Clear cell renal cell carcinoma  Sarcomatoid Features: Not identified  Rhabdoid Features: Not identified  Histologic Grade: Grade 3  Tumor Necrosis: present, 15% of the parenchymal volume   Tumor Extension: and invades into perinephric tissue, adrenal  parenchyma, renal vein and renal sinus.  Lymphatic and/or Vascular Invasion:  Not identified  Margins: Renal vein margin is positive for tumor  Regional Lymph Nodes:       Number of Lymph Nodes with Tumor: 0            Number of Lymph Nodes Examined: 1  Distant Metastasis:       Distant Site(s) Involved: Not applicable  Additional Findings in Nonneoplastic Kidney: Focal interstitial  inflammation without glomerulosclerosis, tubal atrophy or vasculopathy  Pathologic Stage Classification (pTNM, AJCC 8th Edition): pT4, pN0    07/16/2022 Initial Diagnosis   Clear cell carcinoma of kidney (HCC)   08/11/2022 -  Chemotherapy   Received first dose of pembrolizumab  Patient is on Treatment Plan : RENAL Pembrolizumab  (200) q21d      10/12/2022 Imaging   Presented with right sided flank pain.  CT AP 1. Status post right nephrectomy and right adrenalectomy. Poorly defined soft tissue density in the expected location of right renal fossa with extensive ill-defined nodular soft tissue thickening in the right retroperitoneum, concerning for residual or recurrent tumor and metastatic disease. 2. Enlarged liver with multiple hypodense areas suspicious for metastatic disease. Suspect large metastatic mass involving the caudate lobe of liver with more focal hypodensity in the vicinity of the IVC, probably related to tumor thrombus. 3. Triangular shaped gas and fluid collection either within the right hepatic lobe or abutting its margin given appearance on postoperative CT from June. This area measures roughly 5.4 by 4.5 cm and is indeterminate for necrotic tumor or infected fluid collection. 4. Small volume abdominopelvic ascites, new compared to prior exam. 5. Extensive right-sided predominant peritoneal metastatic disease. Small to moderate volume of pelvic ascites. 6. Interim finding of the new 4 mm left lung base pulmonary  nodule, suspicious for a metastatic nodule. 7. Small right-sided pleural effusion with right middle lobe and right lower lobe atelectasis. 8. Gallstones. Nonobstructing left kidney stones.   10/14/2022 -  Hospital Admission   Seen while inpatient findings of metastatic disease.    10/15/2022 -  Chemotherapy   Patient is on Treatment Plan : Lenvatinib  20 mg daily RENAL Pembrolizumab  (200) q21d     01/18/2023 Cancer Staging   Staging form: Kidney, AJCC 8th Edition - Clinical: Stage IV (cT4, cN0, cM1) - Signed by Tina Pauletta BROCKS, MD on 01/18/2023 Histologic grade (G): G3 Histologic grading system: 4 grade system      PHYSICAL EXAMINATION: ECOG PERFORMANCE STATUS: 0 - Asymptomatic  Vitals:   09/02/23 0852  BP: 106/72  Resp: 17  Temp: 98 F (36.7 C)  SpO2: 100%   Filed Weights   09/02/23 0852  Weight: 225 lb 9.6 oz (102.3 kg)    GENERAL: alert, no distress and comfortable SKIN: skin color normal  EYES: normal, sclera clear OROPHARYNX: no exudate  NECK: No palpable mass LYMPH:  no palpable cervical lymphadenopathy  LUNGS: clear to auscultation and percussion with normal breathing effort HEART: regular rate & rhythm  ABDOMEN: abdomen soft, non-tender and nondistended. Musculoskeletal: no edema NEURO: no focal motor/sensory deficits  Relevant data reviewed during this visit included labs.

## 2023-09-02 NOTE — Assessment & Plan Note (Addendum)
 10/15/22 started Lenvatinib  20 mg once daily Difficulty tolerating lenvatinib .  Reduced to 10 mg daily. Continue pembro every 3 weeks         10/28/22 MRI of brian. results negative for brain metastases.  No new symptoms Monitor for new symptoms Patient likes to defer new CT to September.  Return for follow-up as scheduled for next cycle

## 2023-09-02 NOTE — Patient Instructions (Signed)
 [facility] - A DEPT OF [facility]  Discharge Instructions: Thank you for choosing [facility] to provide your oncology and hematology care.   If you have a lab appointment with the Cancer Center, please go directly to the Cancer Center and check in at the registration area.   Wear comfortable clothing and clothing appropriate for easy access to any Portacath or PICC line.   We strive to give you quality time with your provider. You may need to reschedule your appointment if you arrive late (15 or more minutes).  Arriving late affects you and other patients whose appointments are after yours.  Also, if you miss three or more appointments without notifying the office, you may be dismissed from the clinic at the provider's discretion.      For prescription refill requests, have your pharmacy contact our office and allow 72 hours for refills to be completed.    Today you received the following chemotherapy and/or immunotherapy agents keytruda       To help prevent nausea and vomiting after your treatment, we encourage you to take your nausea medication as directed.  BELOW ARE SYMPTOMS THAT SHOULD BE REPORTED IMMEDIATELY: *FEVER GREATER THAN 100.4 F (38 C) OR HIGHER *CHILLS OR SWEATING *NAUSEA AND VOMITING THAT IS NOT CONTROLLED WITH YOUR NAUSEA MEDICATION *UNUSUAL SHORTNESS OF BREATH *UNUSUAL BRUISING OR BLEEDING *URINARY PROBLEMS (pain or burning when urinating, or frequent urination) *BOWEL PROBLEMS (unusual diarrhea, constipation, pain near the anus) TENDERNESS IN MOUTH AND THROAT WITH OR WITHOUT PRESENCE OF ULCERS (sore throat, sores in mouth, or a toothache) UNUSUAL RASH, SWELLING OR PAIN  UNUSUAL VAGINAL DISCHARGE OR ITCHING   Items with * indicate a potential emergency and should be followed up as soon as possible or go to the Emergency Department if any problems should occur.  Please show the CHEMOTHERAPY ALERT CARD or IMMUNOTHERAPY  ALERT CARD at check-in to the Emergency Department and triage nurse.  Should you have questions after your visit or need to cancel or reschedule your appointment, please contact [facility] - A DEPT OF [facility]  Dept: [phone]  and follow the prompts.  Office hours are 8:00 a.m. to 4:30 p.m. Monday - Friday. Please note that voicemails left after 4:00 p.m. may not be returned until the following business day.  We are closed weekends and major holidays. You have access to a nurse at all times for urgent questions. Please call the main number to the clinic Dept: [phone] and follow the prompts.   For any non-urgent questions, you may also contact your provider using MyChart. We now offer e-Visits for anyone 47 and older to request care online for non-urgent symptoms. For details visit [email or web address].   Also download the MyChart app! Go to the app store, search MyChart, open the app, select [place], and log in with your MyChart username and password.

## 2023-09-02 NOTE — Assessment & Plan Note (Addendum)
 Diarrhea required IV fluid hydration, worsening renal insufficiency. Repeat labs today showed Reduced dose helped. Continue Imodium as needed 4 times a day

## 2023-09-02 NOTE — Assessment & Plan Note (Addendum)
 Hypotension today Will hold metoprolol  and telmisartan  today. If SBP is <120 hold both medication.

## 2023-09-02 NOTE — Progress Notes (Signed)
 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 S/p right nephrectomy (06/18/22). Patient currently receiving reduced dose lenvatinib  + keytruda  q21d  Past medical history includes HTN, GERD, CKD3, anxiety, vit D deficiency, PE on anticoagulant  Met with patient and significant other in infusion. Appetite has been poor secondary to diarrhea episodes. This has resolved with imodium. Patient reports having 2 formed bowel movements. He continues to take daily antidiarrheal. Patient working to increase daily intake. Unsure what foods he should be limiting given diarrhea. Patient would like to gain some of his weight back.   Currently, patient eating 3 small meals. Usually a pop tart for breakfast, sandwich for lunch. Significant other reports a few bites at dinner. Patient is drinking fairlife shake for added protein. He has been drinking more water  ~60 ounces as well as Gatorade.   Nutrition Focused Physical Exam:   Orbital Region: moderate Buccal Region: severe Upper Arm Region: uta (sweatshirt + blankets) Thoracic and Lumbar Region: uta  Temple Region: severe Clavicle Bone Region: Psychiatric nurse and Acromion Bone Region: uta Scapular Bone Region: uta Dorsal Hand: moderate Patellar Region: uta Anterior Thigh Region: uta Posterior Calf Region: uta Edema (RD assessment): uta Hair: uta (covered) Eyes: light yellowing to sclera (elevated bili per labs) Mouth: reviewed  Skin: reviewed  Nails: reviewed    Medications: eliquis , zyrtec , colace, imodium, ativan, toprol , compazine , trazodone , telmisartan    Labs: Na 134, Cr 1.70, albumin  3.3, total bilirubin 1.7, Mg 1.5   Anthropometrics:   Height: 6'4 Weight: 225 lb 9.6 oz  UBW: 294 lb (1/31) BMI: 27.46    NUTRITION DIAGNOSIS: Unintended wt loss related to cancer and associated  treatment side effects as evidenced by drug-induced diarrhea, 23% (69 lb) decrease from usual weight in the last 7 months - severe for time frame   MALNUTRITION DIAGNOSIS: Pt meets criteria for severe malnutrition related to chronic disease as evidenced by moderate/severe fat/muscle depletion, 23% wt loss in 7 months   INTERVENTION:  Educated on importance of adequate calorie and protein energy intake to maintain weights/strength Discussed strategies for diarrhea including small frequent meals vs attempting 3 larger meals, limiting insoluble fiber intake and including foods with pectin to aid with bulking of stool - handout with tips + high/low fiber foods list provided Educated on sources of protein, recommend protein foods at every meal - suggested provided (having hard boiled egg with poptart, morning snack of cottage cheese/canned peaches,pears, baked chicken/fish, mashed potatoes without skin, green beans/carrots) - soft moist high protein foods + snack ideas Continue drinking fairlife shakes, suggested trying fairlife milk in place of regular cows milk  Encourage high protein bedtime snack Contact information provided   MONITORING, EVALUATION, GOAL: Pt will tolerate increased calories and protein to minimize additional wt loss    Next Visit: Thursday September 18 during infusion

## 2023-09-02 NOTE — Assessment & Plan Note (Addendum)
 Continue on eliquis anticoagulation Bleeding precautions No bleeding

## 2023-09-02 NOTE — Assessment & Plan Note (Addendum)
 Secondary to diarrhea. 2 g of IV magnesium  sulfate today.

## 2023-09-03 ENCOUNTER — Other Ambulatory Visit: Payer: Self-pay

## 2023-09-08 ENCOUNTER — Other Ambulatory Visit: Payer: Self-pay

## 2023-09-16 ENCOUNTER — Ambulatory Visit (HOSPITAL_COMMUNITY)
Admission: RE | Admit: 2023-09-16 | Discharge: 2023-09-16 | Disposition: A | Discharge status: Home / Self Care | Source: Ambulatory Visit

## 2023-09-16 DIAGNOSIS — C641 Malignant neoplasm of right kidney, except renal pelvis: Secondary | ICD-10-CM | POA: Insufficient documentation

## 2023-09-16 MED ORDER — IOHEXOL 300 MG/ML  SOLN
100.0000 mL | Freq: Once | INTRAMUSCULAR | Status: AC | PRN
  Administered 2023-09-16: 100 mL via INTRAVENOUS

## 2023-09-21 ENCOUNTER — Ambulatory Visit: Payer: Self-pay

## 2023-09-21 NOTE — Progress Notes (Unsigned)
 [facility] OFFICE PROGRESS NOTE  Patient Care Team: Norleen Lynwood ORN, MD as PCP - General  Marcus Burns is a 43 year old pleasant male with history of ccRCC with metastatic disease here for follow up. Imaging showed peritoneal carcinomatosis, pulmonary nodules, liver metastases.    Diagnosis: mRCC with peritoneal carcinomatosis, liver, lung metastases Treatment:  10/15/2022 lenvatinib . 20 mg daily with pembrolizumab    Now with progression of liver metastases on first-line len/pem.  Assessment & Plan   No orders of the defined types were placed in this encounter.    Marcus JAYSON Chihuahua, MD  INTERVAL HISTORY: Patient returns for follow-up.  Oncology History  Clear cell carcinoma of kidney (HCC)  03/11/2022 Imaging   CTA Extensive right-sided pulmonary emboli with evidence of right heart strain.   Pleural based somewhat rounded areas of increased airspace opacity in the right lower lobe likely representing early changes of pulmonary infarct.   Changes consistent with the known history of right renal cell carcinoma.   05/10/2022 Imaging   CTA 1. Significantly decreased though not entirely resolved clot burden in the right lung with persistent small volume mural adherent clot. 2. Linear opacities in the right lower lobe may reflect atelectasis or developing scar. 3. Right upper pole renal mass again seen, incompletely imaged. 4. Cholelithiasis.   06/18/2022 Pathology Results   A. KIDNEY, RIGHT AND PERICAVAL LYMPH NODES, RADICAL NEPHRECTOMY:       Clear cell renal cell carcinoma, WHO / ISUP grade 3 (of 4).       Tumor size: 14.5 x 12 x 8.5 cm.       Tumor necrosis identified accounting for approximately 15% of  parenchyma.      Carcinoma disrupts capsule and invades into perinephric tissue,  adrenal parenchyma, renal vein and renal sinus.       Renal vein margin is positive for carcinoma.  Ureteral and vascular margins are negative for carcinoma.  Separated tumor satellite  focus (0.5 cm).  Background renal parenchyma with focal interstitial inflammation without  glomerulosclerosis, tubal atrophy or vasculopathy.  See oncology table.   Procedure: Nephrectomy  Specimen Laterality: Right  Tumor Size: 14.5 x 12 x 8.5 cm  Tumor Focality: One focus of mail tumor (14.5 cm) with one fucus of  tumor satellite nodule (0.5 cm)  Histologic Type: Clear cell renal cell carcinoma  Sarcomatoid Features: Not identified  Rhabdoid Features: Not identified  Histologic Grade: Grade 3  Tumor Necrosis: present, 15% of the parenchymal volume  Tumor Extension: and invades into perinephric tissue, adrenal  parenchyma, renal vein and renal sinus.  Lymphatic and/or Vascular Invasion:  Not identified  Margins: Renal vein margin is positive for tumor  Regional Lymph Nodes:       Number of Lymph Nodes with Tumor: 0            Number of Lymph Nodes Examined: 1  Distant Metastasis:       Distant Site(s) Involved: Not applicable  Additional Findings in Nonneoplastic Kidney: Focal interstitial  inflammation without glomerulosclerosis, tubal atrophy or vasculopathy  Pathologic Stage Classification (pTNM, AJCC 8th Edition): pT4, pN0    07/16/2022 Initial Diagnosis   Clear cell carcinoma of kidney (HCC)   08/11/2022 -  Chemotherapy   Received first dose of pembrolizumab  Patient is on Treatment Plan : RENAL Pembrolizumab  (200) q21d      10/12/2022 Imaging   Presented with right sided flank pain.  CT AP 1. Status post right nephrectomy and right adrenalectomy. Poorly defined soft tissue density  in the expected location of right renal fossa with extensive ill-defined nodular soft tissue thickening in the right retroperitoneum, concerning for residual or recurrent tumor and metastatic disease. 2. Enlarged liver with multiple hypodense areas suspicious for metastatic disease. Suspect large metastatic mass involving the caudate lobe of liver with more focal hypodensity in the vicinity  of the IVC, probably related to tumor thrombus. 3. Triangular shaped gas and fluid collection either within the right hepatic lobe or abutting its margin given appearance on postoperative CT from June. This area measures roughly 5.4 by 4.5 cm and is indeterminate for necrotic tumor or infected fluid collection. 4. Small volume abdominopelvic ascites, new compared to prior exam. 5. Extensive right-sided predominant peritoneal metastatic disease. Small to moderate volume of pelvic ascites. 6. Interim finding of the new 4 mm left lung base pulmonary nodule, suspicious for a metastatic nodule. 7. Small right-sided pleural effusion with right middle lobe and right lower lobe atelectasis. 8. Gallstones. Nonobstructing left kidney stones.   10/14/2022 -  Hospital Admission   Seen while inpatient findings of metastatic disease.    10/15/2022 -  Chemotherapy   Patient is on Treatment Plan : Lenvatinib  20 mg daily RENAL Pembrolizumab  (200) q21d     01/18/2023 Cancer Staging   Staging form: Kidney, AJCC 8th Edition - Clinical: Stage IV (cT4, cN0, cM1) - Signed by Marcus Marcus BROCKS, MD on 01/18/2023 Histologic grade (G): G3 Histologic grading system: 4 grade system      PHYSICAL EXAMINATION: ECOG PERFORMANCE STATUS: {CHL ONC ECOG PS:[phone]}  There were no vitals filed for this visit. There were no vitals filed for this visit.  GENERAL: alert, no distress and comfortable SKIN: skin color normal and no bruising or petechiae or jaundice on exposed skin EYES: normal, sclera clear OROPHARYNX: no exudate  NECK: No palpable mass LYMPH:  no palpable cervical, axillary lymphadenopathy  LUNGS: clear to auscultation and percussion with normal breathing effort HEART: regular rate & rhythm  ABDOMEN: abdomen soft, non-tender and nondistended. Musculoskeletal: no edema NEURO: no focal motor/sensory deficits  Relevant data reviewed during this visit included ***

## 2023-09-22 ENCOUNTER — Inpatient Hospital Stay: Admitting: Dietician

## 2023-09-22 ENCOUNTER — Inpatient Hospital Stay: Attending: Hematology and Oncology

## 2023-09-22 ENCOUNTER — Other Ambulatory Visit (HOSPITAL_COMMUNITY): Payer: Self-pay

## 2023-09-22 ENCOUNTER — Inpatient Hospital Stay

## 2023-09-22 ENCOUNTER — Telehealth: Payer: Self-pay

## 2023-09-22 VITALS — BP 124/83 | HR 96 | Temp 97.7°F | Resp 16 | Wt 239.3 lb

## 2023-09-22 DIAGNOSIS — Z86711 Personal history of pulmonary embolism: Secondary | ICD-10-CM | POA: Diagnosis not present

## 2023-09-22 DIAGNOSIS — G4700 Insomnia, unspecified: Secondary | ICD-10-CM | POA: Insufficient documentation

## 2023-09-22 DIAGNOSIS — C786 Secondary malignant neoplasm of retroperitoneum and peritoneum: Secondary | ICD-10-CM | POA: Insufficient documentation

## 2023-09-22 DIAGNOSIS — C787 Secondary malignant neoplasm of liver and intrahepatic bile duct: Secondary | ICD-10-CM | POA: Diagnosis present

## 2023-09-22 DIAGNOSIS — C641 Malignant neoplasm of right kidney, except renal pelvis: Secondary | ICD-10-CM

## 2023-09-22 DIAGNOSIS — R918 Other nonspecific abnormal finding of lung field: Secondary | ICD-10-CM | POA: Insufficient documentation

## 2023-09-22 DIAGNOSIS — I159 Secondary hypertension, unspecified: Secondary | ICD-10-CM | POA: Diagnosis not present

## 2023-09-22 DIAGNOSIS — Z905 Acquired absence of kidney: Secondary | ICD-10-CM | POA: Insufficient documentation

## 2023-09-22 DIAGNOSIS — Z7901 Long term (current) use of anticoagulants: Secondary | ICD-10-CM | POA: Insufficient documentation

## 2023-09-22 DIAGNOSIS — C649 Malignant neoplasm of unspecified kidney, except renal pelvis: Secondary | ICD-10-CM

## 2023-09-22 LAB — CMP (CANCER CENTER ONLY)
ALT: 43 U/L (ref 0–44)
AST: 112 U/L — ABNORMAL HIGH (ref 15–41)
Albumin: 3.2 g/dL — ABNORMAL LOW (ref 3.5–5.0)
Alkaline Phosphatase: 131 U/L — ABNORMAL HIGH (ref 38–126)
Anion gap: 6 (ref 5–15)
BUN: 16 mg/dL (ref 6–20)
CO2: 26 mmol/L (ref 22–32)
Calcium: 8.7 mg/dL — ABNORMAL LOW (ref 8.9–10.3)
Chloride: 106 mmol/L (ref 98–111)
Creatinine: 1.37 mg/dL — ABNORMAL HIGH (ref 0.61–1.24)
GFR, Estimated: >60 mL/min (ref >60)
Glucose, Bld: 89 mg/dL (ref 70–99)
Potassium: 3.9 mmol/L (ref 3.5–5.1)
Sodium: 138 mmol/L (ref 135–145)
Total Bilirubin: 0.8 mg/dL (ref 0.0–1.2)
Total Protein: 6.2 g/dL — ABNORMAL LOW (ref 6.5–8.1)

## 2023-09-22 LAB — CBC WITH DIFFERENTIAL (CANCER CENTER ONLY)
Abs Immature Granulocytes: 0.01 K/uL (ref 0.00–0.07)
Basophils Absolute: 0 K/uL (ref 0.0–0.1)
Basophils Relative: 1 %
Eosinophils Absolute: 0.3 K/uL (ref 0.0–0.5)
Eosinophils Relative: 9 %
HCT: 37.4 % — ABNORMAL LOW (ref 39.0–52.0)
Hemoglobin: 12.3 g/dL — ABNORMAL LOW (ref 13.0–17.0)
Immature Granulocytes: 0 %
Lymphocytes Relative: 26 %
Lymphs Abs: 0.9 K/uL (ref 0.7–4.0)
MCH: 30.6 pg (ref 26.0–34.0)
MCHC: 32.9 g/dL (ref 30.0–36.0)
MCV: 93 fL (ref 80.0–100.0)
Monocytes Absolute: 0.3 K/uL (ref 0.1–1.0)
Monocytes Relative: 9 %
Neutro Abs: 1.8 K/uL (ref 1.7–7.7)
Neutrophils Relative %: 55 %
Platelet Count: 269 K/uL (ref 150–400)
RBC: 4.02 MIL/uL — ABNORMAL LOW (ref 4.22–5.81)
RDW: 14.7 % (ref 11.5–15.5)
WBC Count: 3.2 K/uL — ABNORMAL LOW (ref 4.0–10.5)
nRBC: 0 % (ref 0.0–0.2)

## 2023-09-22 LAB — T4, FREE: Free T4: 0.83 ng/dL (ref 0.61–1.12)

## 2023-09-22 LAB — TSH: TSH: 74.8 u[IU]/mL — ABNORMAL HIGH (ref 0.350–4.500)

## 2023-09-22 MED ORDER — CABOZANTINIB S-MALATE 60 MG PO TABS: 60.0000 mg | ORAL_TABLET | Freq: Every day | ORAL | 11 refills | Status: DC

## 2023-09-22 MED ORDER — BELZUTIFAN 40 MG PO TABS: 120.0000 mg | ORAL_TABLET | Freq: Every day | ORAL | 11 refills | Status: DC

## 2023-09-22 MED ORDER — TRAZODONE HCL 100 MG PO TABS: 100.0000 mg | ORAL_TABLET | Freq: Every evening | ORAL | 1 refills | Status: DC | PRN

## 2023-09-22 MED ORDER — TRAZODONE HCL 100 MG PO TABS
100.0000 mg | ORAL_TABLET | Freq: Every evening | ORAL | 1 refills | Status: DC | PRN
Start: 2023-09-22 — End: 2023-09-22
  Filled 2023-09-22: qty 30, 30d supply, fill #0

## 2023-09-22 NOTE — Progress Notes (Signed)
 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 S/p right nephrectomy (06/18/22). Change in treatment due to progression. He will start oral cabozantinib  + belzutifan . Patient is under the care of Dr. Tina.  Met with patient and wife in office. Patient reports appetite has improved. Eating 6 small meals as recommended. He has gained 14 lbs in the last 3 weeks. Patient denies fluid accumulation. Patient says he has been doing his part and pushing nutrition. Yesterday had egg with cabot yogurt, cheese stick, applesauce, grilled chicken with rice, clear onion soup, cinnamon toast crunch with fairlife whole milk. He drinks fairlife shakes, but currently out of these. He is drinking some gatorade zero. Mostly water . He has noticed increased energy. Participating in household chores.  Patient reports diarrhea has resolved. He has not needed antidiarrheals. Patient denies nausea or vomiting.    Medications: reviewed   Labs: Cr 1.37, albumin  3.2  Anthropometrics: Wt 239 lb 4.8 oz today increase 14 lb q3w  8/29 - 225 lb 9.6 oz   NUTRITION DIAGNOSIS: Unintended wt loss - improved    MALNUTRITION DIAGNOSIS: Severe malnutrition improving    INTERVENTION:  Continue strategies for increasing calories and protein with small frequent meals/snacks Include protein source at every meal Discussed common side effect of cabometyx  of altered taste - provided handout with tips Continue protein shakes as needed  Encourage activity as able  Support and encouragement    MONITORING, EVALUATION, GOAL: wt trends, intake    NEXT VISIT: Tuesday October 7 via telephone (pt and wife aware)

## 2023-09-22 NOTE — Telephone Encounter (Signed)
 Oral Oncology Patient Advocate Encounter  Prior Authorization for cabozantinib  (CABOMETYX ) 60 MG tablet  has been approved.    PA# [id number] Effective dates: 09/22/23 through 09/21/24  Must fill at [facility].   Lucie Lamer, CPhT [place]  [facility] Specialty Pharmacy Services Pharmacy Technician Patient Advocate Specialist II [facility] [place] Phone: [phone]  Fax: [phone] [email or web address]

## 2023-09-22 NOTE — Telephone Encounter (Addendum)
 Oral Oncology Patient Advocate Encounter  Prior Authorization for belzutifan  (WELIREG ) 40 MG tablet  has been approved.    PA# [id number] Effective dates: 09/21/13 through 09/21/24  Patients co-pay is $0.00  Sending to [employer].  Lucie Lamer, CPhT [place]  [facility] Specialty Pharmacy Services Pharmacy Technician Patient Advocate Specialist II [facility] [place] Phone: [phone]  Fax: [phone] [email or web address]

## 2023-09-22 NOTE — Telephone Encounter (Signed)
 Oral Oncology Patient Advocate Encounter   Received notification that prior authorization for cabozantinib  (CABOMETYX ) 60 MG tablet  is required.   PA submitted on 09/22/23 Key [id number] Status is pending     Lucie Lamer, CPhT [place]  [facility] Specialty Pharmacy Services Pharmacy Technician Patient Advocate Specialist II [facility] [place] Phone: [phone]  Fax: [phone] [email or web address]

## 2023-09-22 NOTE — Assessment & Plan Note (Addendum)
 Normal at home without Micardis  Will resume telmisartan  if SBP is >140

## 2023-09-22 NOTE — Assessment & Plan Note (Addendum)
 Will stop lenvatinib  with pembrolizumab  belzutifan  120 mg oral daily plus cabozantinib  60 mg oral daily

## 2023-09-22 NOTE — Assessment & Plan Note (Addendum)
 Trazodone  100 mg nightly prn

## 2023-09-22 NOTE — Telephone Encounter (Signed)
 Oral Oncology Pharmacist Encounter  Received new prescription for Welireg  (belzutifan ) and Cabometyx  (cabozantinib ) for the treatment of metastatic ccRCC with peritoneal carcinomatosis, liver, and lung, planned duration until disease progression or unacceptable toxicity. Regimen based on the LITESPARK-003 study.   Labs from 09/22/2023 (CBC and CMP, TSH still pending) assessed, no interventions needed. Patients LFTs slightly elevated due to liver mets and renal function is elevated although no dose adjustments needed for either medication.  Prescription doses and frequencies assessed for appropriateness.   Current medication list in Epic reviewed, DDIs with Cabometyx  identified: - apixaban  (cat C): eliquis  may increase toxic effects of cabozantinib . Patient will need to be monitored for thrombocytopenia and neutropenia.   Current medication list in Epic reviewed, no significant/ relevant DDIs with Welireg  identified.  Evaluated chart and no patient barriers to medication adherence noted.   Patient agreement for treatment documented in MD note on 09/22/2023.  Prescription has been e-scribed to the [facility] for benefits analysis and approval.  Oral Oncology Clinic will continue to follow for insurance authorization, copayment issues, initial counseling and start date.  Riyanshi Wahab, PharmD Hematology/Oncology Clinical Pharmacist [facility] Oral Chemotherapy Navigation Clinic [phone] 09/22/2023 8:39 AM

## 2023-09-22 NOTE — Assessment & Plan Note (Addendum)
 Continue on eliquis anticoagulation Bleeding precautions No bleeding

## 2023-09-22 NOTE — Telephone Encounter (Signed)
 Oral Oncology Patient Advocate Encounter   Received notification that prior authorization for belzutifan  (WELIREG ) 40 MG tablet  is required.   PA submitted on 09/22/23 Key [id number] Status is pending     Lucie Lamer, CPhT [place]  [facility] Specialty Pharmacy Services Pharmacy Technician Patient Advocate Specialist II [facility] [place] Phone: [phone]  Fax: [phone] [email or web address]

## 2023-09-27 ENCOUNTER — Encounter (HOSPITAL_COMMUNITY): Payer: Self-pay

## 2023-09-27 ENCOUNTER — Emergency Department (HOSPITAL_COMMUNITY)
Admission: EM | Admit: 2023-09-27 | Discharge: 2023-09-27 | Disposition: A | Discharge status: Home / Self Care | Source: Home / Self Care | Attending: Emergency Medicine | Admitting: Emergency Medicine

## 2023-09-27 ENCOUNTER — Other Ambulatory Visit: Payer: Self-pay

## 2023-09-27 ENCOUNTER — Encounter: Payer: Self-pay | Admitting: *Deleted

## 2023-09-27 ENCOUNTER — Emergency Department (HOSPITAL_COMMUNITY)

## 2023-09-27 DIAGNOSIS — Z8505 Personal history of malignant neoplasm of liver: Secondary | ICD-10-CM | POA: Insufficient documentation

## 2023-09-27 DIAGNOSIS — N189 Chronic kidney disease, unspecified: Secondary | ICD-10-CM | POA: Insufficient documentation

## 2023-09-27 DIAGNOSIS — C649 Malignant neoplasm of unspecified kidney, except renal pelvis: Secondary | ICD-10-CM

## 2023-09-27 DIAGNOSIS — R7401 Elevation of levels of liver transaminase levels: Secondary | ICD-10-CM | POA: Insufficient documentation

## 2023-09-27 DIAGNOSIS — R1011 Right upper quadrant pain: Secondary | ICD-10-CM | POA: Insufficient documentation

## 2023-09-27 DIAGNOSIS — Z7901 Long term (current) use of anticoagulants: Secondary | ICD-10-CM | POA: Insufficient documentation

## 2023-09-27 DIAGNOSIS — I129 Hypertensive chronic kidney disease with stage 1 through stage 4 chronic kidney disease, or unspecified chronic kidney disease: Secondary | ICD-10-CM | POA: Insufficient documentation

## 2023-09-27 DIAGNOSIS — G893 Neoplasm related pain (acute) (chronic): Secondary | ICD-10-CM

## 2023-09-27 DIAGNOSIS — Z79899 Other long term (current) drug therapy: Secondary | ICD-10-CM | POA: Insufficient documentation

## 2023-09-27 DIAGNOSIS — Z85528 Personal history of other malignant neoplasm of kidney: Secondary | ICD-10-CM | POA: Insufficient documentation

## 2023-09-27 DIAGNOSIS — R7989 Other specified abnormal findings of blood chemistry: Secondary | ICD-10-CM | POA: Insufficient documentation

## 2023-09-27 LAB — COMPREHENSIVE METABOLIC PANEL WITH GFR
ALT: 77 U/L — ABNORMAL HIGH (ref 0–44)
AST: 178 U/L — ABNORMAL HIGH (ref 15–41)
Albumin: 3.2 g/dL — ABNORMAL LOW (ref 3.5–5.0)
Alkaline Phosphatase: 181 U/L — ABNORMAL HIGH (ref 38–126)
Anion gap: 15 (ref 5–15)
BUN: 21 mg/dL — ABNORMAL HIGH (ref 6–20)
CO2: 19 mmol/L — ABNORMAL LOW (ref 22–32)
Calcium: 9.2 mg/dL (ref 8.9–10.3)
Chloride: 101 mmol/L (ref 98–111)
Creatinine, Ser: 1.84 mg/dL — ABNORMAL HIGH (ref 0.61–1.24)
GFR, Estimated: 46 mL/min — ABNORMAL LOW
Glucose, Bld: 121 mg/dL — ABNORMAL HIGH (ref 70–99)
Potassium: 4.2 mmol/L (ref 3.5–5.1)
Sodium: 135 mmol/L (ref 135–145)
Total Bilirubin: 1 mg/dL (ref 0.0–1.2)
Total Protein: 6.1 g/dL — ABNORMAL LOW (ref 6.5–8.1)

## 2023-09-27 LAB — CBC WITH DIFFERENTIAL/PLATELET
Abs Immature Granulocytes: 0.01 K/uL (ref 0.00–0.07)
Basophils Absolute: 0 K/uL (ref 0.0–0.1)
Basophils Relative: 1 %
Eosinophils Absolute: 0.4 K/uL (ref 0.0–0.5)
Eosinophils Relative: 7 %
HCT: 44.4 % (ref 39.0–52.0)
Hemoglobin: 13.6 g/dL (ref 13.0–17.0)
Immature Granulocytes: 0 %
Lymphocytes Relative: 14 %
Lymphs Abs: 0.8 K/uL (ref 0.7–4.0)
MCH: 29.3 pg (ref 26.0–34.0)
MCHC: 30.6 g/dL (ref 30.0–36.0)
MCV: 95.7 fL (ref 80.0–100.0)
Monocytes Absolute: 0.4 K/uL (ref 0.1–1.0)
Monocytes Relative: 8 %
Neutro Abs: 4.1 K/uL (ref 1.7–7.7)
Neutrophils Relative %: 70 %
Platelets: 319 K/uL (ref 150–400)
RBC: 4.64 MIL/uL (ref 4.22–5.81)
RDW: 15.3 % (ref 11.5–15.5)
WBC: 5.8 K/uL (ref 4.0–10.5)
nRBC: 0 % (ref 0.0–0.2)

## 2023-09-27 LAB — URINALYSIS, ROUTINE W REFLEX MICROSCOPIC
Bacteria, UA: NONE SEEN
Bilirubin Urine: NEGATIVE
Glucose, UA: NEGATIVE mg/dL
Hgb urine dipstick: NEGATIVE
Ketones, ur: 5 mg/dL — AB
Leukocytes,Ua: NEGATIVE
Nitrite: NEGATIVE
Protein, ur: 100 mg/dL — AB
Specific Gravity, Urine: 1.029 (ref 1.005–1.030)
pH: 5 (ref 5.0–8.0)

## 2023-09-27 LAB — LIPASE, BLOOD: Lipase: 21 U/L (ref 11–51)

## 2023-09-27 MED ORDER — OXYCODONE HCL 5 MG PO TABS: 5.0000 mg | ORAL_TABLET | Freq: Four times a day (QID) | ORAL | 0 refills | Status: DC | PRN

## 2023-09-27 MED ORDER — SODIUM CHLORIDE 0.9 % IV BOLUS
1000.0000 mL | Freq: Once | INTRAVENOUS | Status: AC
  Administered 2023-09-27: 1000 mL via INTRAVENOUS

## 2023-09-27 MED ORDER — HYDROMORPHONE HCL 1 MG/ML IJ SOLN
0.5000 mg | Freq: Once | INTRAMUSCULAR | Status: AC
  Administered 2023-09-27: 0.5 mg via INTRAVENOUS
  Filled 2023-09-27: qty 1

## 2023-09-27 MED ORDER — IOHEXOL 300 MG/ML  SOLN
80.0000 mL | Freq: Once | INTRAMUSCULAR | Status: AC | PRN
  Administered 2023-09-27: 80 mL via INTRAVENOUS

## 2023-09-27 MED ORDER — ONDANSETRON HCL 4 MG/2ML IJ SOLN
4.0000 mg | Freq: Once | INTRAMUSCULAR | Status: AC
  Administered 2023-09-27: 4 mg via INTRAVENOUS
  Filled 2023-09-27: qty 2

## 2023-09-27 NOTE — ED Notes (Signed)
 Patient requesting more pain medication. [place], [place] notified.

## 2023-09-27 NOTE — Progress Notes (Signed)
 Oxycodone  sent to his [facility] pharmacy.

## 2023-09-27 NOTE — ED Notes (Signed)
 Patient given urinal and understands that we need a sample. Unable to provide right now. Said his pain is too out of control.

## 2023-09-27 NOTE — Discharge Instructions (Signed)
 As discussed, you will need to follow-up with your oncologist.  Seek emergency care if experiencing any new or worsening symptoms.

## 2023-09-27 NOTE — ED Provider Notes (Signed)
 [facility] AT [facility] Provider Note   CSN: [id number] Arrival date & time: 09/27/23  9157     Patient presents with: Flank Pain and Abdominal Pain   Marcus Burns is a 43 y.o. male with PMHx GERD, HTN, migraines, PE on Eliquis , CKD, metastatic renal cell carcinoma to liver who presents to ED concerned for right flank pain starting last night while patient was resting. Patient stating that he was started on Cabozantinib  this past Friday and was educated that it might cause abdominal pain, but did not expect it to be this severe. Patient only with relief while bending over bed in a flank-position. Last BM yesterday was normal.    Denies fever, chest pain, dyspnea, cough, nausea, vomiting, diarrhea, dysuria, hematuria, hematochezia.     Flank Pain Associated symptoms include abdominal pain.  Abdominal Pain      Prior to Admission medications   Medication Sig Start Date End Date Taking? Authorizing Provider  acetaminophen  (TYLENOL ) 500 MG tablet Take 1 tablet (500 mg total) by mouth every 6 (six) hours as needed for moderate pain (pain.). 10/14/22   Briana Elgin LABOR, MD  albuterol  (VENTOLIN  HFA) 108 (90 Base) MCG/ACT inhaler Inhale 2 puffs into the lungs every 6 (six) hours as needed for wheezing or shortness of breath. 12/24/21   Norleen Lynwood ORN, MD  apixaban  (ELIQUIS ) 5 MG TABS tablet Take 1 tablet (5 mg total) by mouth 2 (two) times daily. 05/18/23   Iruku, Praveena, MD  belzutifan  (WELIREG ) 40 MG tablet Take 3 tablets (120 mg total) by mouth daily. 09/22/23   Tina Pauletta BROCKS, MD  cabozantinib  (CABOMETYX ) 60 MG tablet Take 1 tablet (60 mg total) by mouth daily. Take on an empty stomach, 1 hour before or 2 hours after meals. 09/22/23   Tina Pauletta BROCKS, MD  cetirizine  (ZYRTEC ) 10 MG tablet Take 10 mg by mouth daily.    [provider]  docusate sodium  (COLACE) 100 MG capsule Take 1 capsule (100 mg total) by mouth 2 (two) times daily. Patient  taking differently: Take 100 mg by mouth as needed for mild constipation or moderate constipation. 06/18/22   Cory Palma, PA-C  loperamide (IMODIUM) 2 MG capsule Take by mouth as needed for diarrhea or loose stools.    [provider]  LORazepam  (ATIVAN ) 0.5 MG tablet Take 0.5 mg by mouth at bedtime as needed for anxiety or sleep.    [provider]  metoprolol  succinate (TOPROL -XL) 50 MG 24 hr tablet Take 1 tablet (50 mg total) by mouth daily. Take with or immediately following a meal. 12/24/22   Tina Pauletta BROCKS, MD  oxyCODONE  (OXY IR/ROXICODONE ) 5 MG immediate release tablet Take 1 tablet (5 mg total) by mouth every 6 (six) hours as needed for severe pain (pain score 7-10). 09/27/23   Tina Pauletta BROCKS, MD  telmisartan  (MICARDIS ) 40 MG tablet TAKE 1 TABLET BY MOUTH EVERY DAY 08/26/23   Tina Pauletta BROCKS, MD  traZODone  (DESYREL ) 100 MG tablet Take 1 tablet (100 mg total) by mouth at bedtime as needed for sleep. 09/22/23   Tina Pauletta BROCKS, MD  triamcinolone  (NASACORT ) 55 MCG/ACT AERO nasal inhaler Place 2 sprays into the nose daily. Patient not taking: Reported on 02/01/2019 05/13/17 02/01/19  Norleen Lynwood ORN, MD    Allergies: Amlodipine  and Doxycycline     Review of Systems  Gastrointestinal:  Positive for abdominal pain.  Genitourinary:  Positive for flank pain.    Updated Vital Signs BP 120/80  Pulse 90   Temp 97.9 F (36.6 C) (Oral)   Resp 15   Ht 6' 4 (1.93 m)   Wt 108 kg   SpO2 97%   BMI 28.98 kg/m   Physical Exam Vitals and nursing note reviewed.  Constitutional:      General: He is not in acute distress.    Appearance: He is not ill-appearing or toxic-appearing.  HENT:     Head: Normocephalic and atraumatic.     Mouth/Throat:     Mouth: Mucous membranes are moist.     Pharynx: No posterior oropharyngeal erythema.  Eyes:     General: No scleral icterus.       Right eye: No discharge.        Left eye: No discharge.     Conjunctiva/sclera: Conjunctivae  normal.  Cardiovascular:     Rate and Rhythm: Regular rhythm. Tachycardia present.     Pulses: Normal pulses.     Heart sounds: Normal heart sounds. No murmur heard. Pulmonary:     Effort: Pulmonary effort is normal. No respiratory distress.     Breath sounds: Normal breath sounds. No wheezing, rhonchi or rales.  Abdominal:     General: Bowel sounds are normal. There is no distension.     Palpations: There is hepatomegaly.     Tenderness: There is abdominal tenderness.     Comments: Right flank tenderness to palpation  Musculoskeletal:     Right lower leg: No edema.     Left lower leg: No edema.  Skin:    General: Skin is warm and dry.     Findings: No rash.  Neurological:     General: No focal deficit present.     Mental Status: He is alert and oriented to person, place, and time. Mental status is at baseline.  Psychiatric:        Mood and Affect: Mood normal.        Behavior: Behavior normal.     (all labs ordered are listed, but only abnormal results are displayed) Labs Reviewed  COMPREHENSIVE METABOLIC PANEL WITH GFR - Abnormal; Notable for the following components:      Result Value   CO2 19 (*)    Glucose, Bld 121 (*)    BUN 21 (*)    Creatinine, Ser 1.84 (*)    Total Protein 6.1 (*)    Albumin  3.2 (*)    AST 178 (*)    ALT 77 (*)    Alkaline Phosphatase 181 (*)    GFR, Estimated 46 (*)    All other components within normal limits  URINALYSIS, ROUTINE W REFLEX MICROSCOPIC - Abnormal; Notable for the following components:   Color, Urine AMBER (*)    APPearance HAZY (*)    Ketones, ur 5 (*)    Protein, ur 100 (*)    All other components within normal limits  LIPASE, BLOOD  CBC WITH DIFFERENTIAL/PLATELET    EKG: None  Radiology: CT ABDOMEN PELVIS W CONTRAST Result Date: 09/27/2023 CLINICAL DATA:  Abdominal pain, acute, nonlocalized EXAM: CT ABDOMEN AND PELVIS WITH CONTRAST TECHNIQUE: Multidetector CT imaging of the abdomen and pelvis was performed using  the standard protocol following bolus administration of intravenous contrast. RADIATION DOSE REDUCTION: This exam was performed according to the departmental dose-optimization program which includes automated exposure control, adjustment of the mA and/or kV according to patient size and/or use of iterative reconstruction technique. CONTRAST:  80mL OMNIPAQUE  IOHEXOL  300 MG/ML  SOLN COMPARISON:  09/16/2023 FINDINGS: Lower chest:  Trace right pleural effusion with right basilar atelectasis.Unchanged 2 mm left lower lobe nodule abutting the hemidiaphragm (axial 42). Hepatobiliary: Enlarged liver, which is full of innumerable hypodense masses, without significant interval change.For example, the largest mass in the central right hepatic lobe measures 5.7 x 8.4 cm (axial 38), previously measuring 5.2 x 6.6 cm.The gallbladder was not well visualized or evaluated. No intrahepatic or extrahepatic biliary ductal dilation. The portal veins are patent. Pancreas: No mass or main ductal dilation. No peripancreatic inflammation or fluid collection. Spleen: Normal size. No mass. Adrenals/Urinary Tract: No adrenal masses. The right kidney is surgically absent. No nephrolithiasis or hydronephrosis. The urinary bladder is completely decompressed. Stomach/Bowel: The stomach is decompressed without focal abnormality. No small bowel wall thickening or inflammation. No small bowel obstruction.Normal appendix. Vascular/Lymphatic: No aortic aneurysm. No intraabdominal or pelvic lymphadenopathy. Nearly occlusive thrombus again noted within the intrahepatic IVC (axial 45). Reproductive: No prostatomegaly.Small volume free fluid in the pelvis. Other: Peritoneal nodule along the ventral abdominal wall measures 2.2 x 4.1 cm (previously 1.7 x 3.3 cm), axial 64. multiple additional small areas of nodularity in the epigastric fat adjacent to the ventral abdominal wall (axial 55), more prominent than on the prior study. No pneumoperitoneum.  Peritoneal thickening in the right paracolic gutter is also noted (axial 79). Mixed gas and fluid collection in the hepatorenal fossa is otherwise unchanged. Musculoskeletal: No acute fracture or destructive lesion. Moderate anasarca. Intramuscular metastatic disease in the inferior right rectus sheath measuring 1.4 cm (axial 76). IMPRESSION: 1. No acute intra-abdominal or pelvic abnormality. 2. Extensive intrahepatic metastases again noted filling the entire hepatic parenchyma, with slight enlargement of the index mass in the central right hepatic lobe, measuring 5.7 x 8.4 cm (previously, 5.2 x 6.6 cm, axial 38). 3. Worsening of the peritoneal metastatic disease. For example, the peritoneal nodule subjacent to the midline ventral incision (axial 64), measures 2.2 x 4.1 cm (previously, 1.7 x 3.3 cm). Trace malignant ascites in the pelvis. 4. Intramuscular metastatic disease along the inferior right rectus sheath measuring 1.4 cm (axial 76). 5. Similar appearance of the nearly occlusive thrombus within the intrahepatic IVC. Electronically Signed   By: Rogelia Myers M.D.   On: 09/27/2023 12:17     Procedures   Medications Ordered in the ED  HYDROmorphone  (DILAUDID ) injection 0.5 mg (0.5 mg Intravenous Given 09/27/23 1001)  ondansetron  (ZOFRAN ) injection 4 mg (4 mg Intravenous Given 09/27/23 1001)  iohexol  (OMNIPAQUE ) 300 MG/ML solution 80 mL (80 mLs Intravenous Contrast Given 09/27/23 1108)  HYDROmorphone  (DILAUDID ) injection 0.5 mg (0.5 mg Intravenous Given 09/27/23 1116)  sodium chloride  0.9 % bolus 1,000 mL (0 mLs Intravenous Stopped 09/27/23 1339)  sodium chloride  0.9 % bolus 1,000 mL (0 mLs Intravenous Stopped 09/27/23 1434)  HYDROmorphone  (DILAUDID ) injection 0.5 mg (0.5 mg Intravenous Given 09/27/23 1343)                                    Medical Decision Making Amount and/or Complexity of Data Reviewed Labs: ordered. Radiology: ordered.  Risk Prescription drug management.    This  patient presents to the ED for concern of abdominal pain, this involves an extensive number of treatment options, and is a complaint that carries with it a high risk of complications and morbidity.  The differential diagnosis includes gastroenteritis, colitis, small bowel obstruction, appendicitis, cholecystitis, pancreatitis, nephrolithiasis, UTI, pyelonephritis   Co morbidities that complicate the patient evaluation  GERD,  HTN, migraines, PE on Eliquis , CKD, metastatic renal cell carcinoma to liver    Additional history obtained:  Additional history obtained from 9/18 oncology office visit: Discussed disease progression today. He has significant liver metastases all over the liver. His liver enzyme increased a little bit today. He is asymptomatic. We will stop Keytruda . When he has diffuse liver metastases and disease burden, we will need quick response in order to preserve his liver function. Will switch to cabozantinib  with belzutifan .   Problem List / ED Course / Critical interventions / Medication management  Patient presented for right abdominal pain since last night.  Physical exam with hepatomegaly and tenderness to right abdomen.  Patient also tachycardic around 120 bpm on initial interview which appears to be due to pain.  Rest of physical exam reassuring.  Patient afebrile with stable vitals. I Ordered, and personally interpreted labs.  UA not concerning for infection.  CBC without leukocytosis or anemia.  CMP with elevation in BUN/creatinine at 21/1.84.  Liver enzymes are also elevated with AST/ALT/ALP at 178/17/181 today.  Lipase within normal limits. I ordered imaging studies including CT Abd/Pelvis with contrast: evaluate for structural/surgical etiology of patients' severe abdominal pain. I independently visualized and interpreted imaging and I agree with the radiologist interpretation of no acute process. I requested consultation with patient's oncologist Dr. Tina,  and  discussed lab and imaging findings as well as pertinent plan - they recommend: IV fluids, pain control, and outpatient follow-up.  Dr. Tina has prescribed patient narcotic pain management. Patient's tachycardia resolving after 2L IV fluids and pain management. Shared all results with patient.  Answered all questions.  Patient is agreeable to plan to follow-up outpatient.  Patient now feeling a lot better and is tolerating PO.  Patient ready to go home. Patient verbalized understanding of plan to follow up with oncology. Patient also understands to drink plenty of water  to stay hydrated over the next week to help resolve elevated kidney function test.  I have reviewed the patients home medicines and have made adjustments as needed The patient has been appropriately medically screened and/or stabilized in the ED. I have low suspicion for any other emergent medical condition which would require further screening, evaluation or treatment in the ED or require inpatient management. At time of discharge the patient is hemodynamically stable and in no acute distress. I have discussed work-up results and diagnosis with patient and answered all questions. Patient is agreeable with discharge plan. We discussed strict return precautions for returning to the emergency department and they verbalized understanding.     Social Determinants of Health:  none       Final diagnoses:  Right upper quadrant abdominal pain  Metastatic renal cell carcinoma, unspecified laterality [facility])    ED Discharge Orders     None          Hoy Nidia FALCON, [place] 09/27/23 1443    Bernard Drivers, MD 09/27/23 1531

## 2023-09-27 NOTE — ED Triage Notes (Addendum)
 Pt reports with right flank/RUQ pain that started last night. Pt reports having cancer in his liver.

## 2023-09-28 ENCOUNTER — Encounter (HOSPITAL_COMMUNITY): Payer: Self-pay

## 2023-09-28 ENCOUNTER — Inpatient Hospital Stay (HOSPITAL_COMMUNITY)
Admission: EM | Admit: 2023-09-28 | Discharge: 2023-11-05 | DRG: 682 | Disposition: E | Source: Ambulatory Visit | Attending: Pulmonary Disease | Admitting: Pulmonary Disease

## 2023-09-28 ENCOUNTER — Telehealth: Payer: Self-pay | Admitting: *Deleted

## 2023-09-28 ENCOUNTER — Other Ambulatory Visit: Payer: Self-pay

## 2023-09-28 DIAGNOSIS — C786 Secondary malignant neoplasm of retroperitoneum and peritoneum: Secondary | ICD-10-CM | POA: Diagnosis present

## 2023-09-28 DIAGNOSIS — I129 Hypertensive chronic kidney disease with stage 1 through stage 4 chronic kidney disease, or unspecified chronic kidney disease: Secondary | ICD-10-CM | POA: Diagnosis present

## 2023-09-28 DIAGNOSIS — E871 Hypo-osmolality and hyponatremia: Secondary | ICD-10-CM | POA: Diagnosis present

## 2023-09-28 DIAGNOSIS — R7401 Elevation of levels of liver transaminase levels: Secondary | ICD-10-CM | POA: Diagnosis present

## 2023-09-28 DIAGNOSIS — N1411 Contrast-induced nephropathy: Secondary | ICD-10-CM | POA: Diagnosis present

## 2023-09-28 DIAGNOSIS — I10 Essential (primary) hypertension: Secondary | ICD-10-CM

## 2023-09-28 DIAGNOSIS — F419 Anxiety disorder, unspecified: Secondary | ICD-10-CM

## 2023-09-28 DIAGNOSIS — Z6832 Body mass index (BMI) 32.0-32.9, adult: Secondary | ICD-10-CM

## 2023-09-28 DIAGNOSIS — R64 Cachexia: Secondary | ICD-10-CM | POA: Diagnosis present

## 2023-09-28 DIAGNOSIS — C641 Malignant neoplasm of right kidney, except renal pelvis: Secondary | ICD-10-CM

## 2023-09-28 DIAGNOSIS — J9811 Atelectasis: Secondary | ICD-10-CM | POA: Diagnosis present

## 2023-09-28 DIAGNOSIS — C649 Malignant neoplasm of unspecified kidney, except renal pelvis: Secondary | ICD-10-CM | POA: Diagnosis present

## 2023-09-28 DIAGNOSIS — R4589 Other symptoms and signs involving emotional state: Secondary | ICD-10-CM

## 2023-09-28 DIAGNOSIS — N179 Acute kidney failure, unspecified: Secondary | ICD-10-CM | POA: Diagnosis not present

## 2023-09-28 DIAGNOSIS — I82220 Acute embolism and thrombosis of inferior vena cava: Secondary | ICD-10-CM | POA: Diagnosis present

## 2023-09-28 DIAGNOSIS — R000 Tachycardia, unspecified: Secondary | ICD-10-CM | POA: Diagnosis present

## 2023-09-28 DIAGNOSIS — Z8249 Family history of ischemic heart disease and other diseases of the circulatory system: Secondary | ICD-10-CM

## 2023-09-28 DIAGNOSIS — E875 Hyperkalemia: Secondary | ICD-10-CM | POA: Diagnosis present

## 2023-09-28 DIAGNOSIS — Z8616 Personal history of COVID-19: Secondary | ICD-10-CM

## 2023-09-28 DIAGNOSIS — R791 Abnormal coagulation profile: Secondary | ICD-10-CM | POA: Diagnosis present

## 2023-09-28 DIAGNOSIS — N189 Chronic kidney disease, unspecified: Secondary | ICD-10-CM | POA: Diagnosis present

## 2023-09-28 DIAGNOSIS — G893 Neoplasm related pain (acute) (chronic): Principal | ICD-10-CM | POA: Diagnosis present

## 2023-09-28 DIAGNOSIS — D630 Anemia in neoplastic disease: Secondary | ICD-10-CM | POA: Diagnosis present

## 2023-09-28 DIAGNOSIS — C7800 Secondary malignant neoplasm of unspecified lung: Secondary | ICD-10-CM | POA: Diagnosis present

## 2023-09-28 DIAGNOSIS — R7989 Other specified abnormal findings of blood chemistry: Secondary | ICD-10-CM | POA: Diagnosis present

## 2023-09-28 DIAGNOSIS — N1832 Chronic kidney disease, stage 3b: Secondary | ICD-10-CM | POA: Diagnosis present

## 2023-09-28 DIAGNOSIS — Z515 Encounter for palliative care: Secondary | ICD-10-CM

## 2023-09-28 DIAGNOSIS — Z818 Family history of other mental and behavioral disorders: Secondary | ICD-10-CM

## 2023-09-28 DIAGNOSIS — E669 Obesity, unspecified: Secondary | ICD-10-CM | POA: Diagnosis present

## 2023-09-28 DIAGNOSIS — Z7901 Long term (current) use of anticoagulants: Secondary | ICD-10-CM

## 2023-09-28 DIAGNOSIS — R52 Pain, unspecified: Secondary | ICD-10-CM | POA: Diagnosis present

## 2023-09-28 DIAGNOSIS — K219 Gastro-esophageal reflux disease without esophagitis: Secondary | ICD-10-CM

## 2023-09-28 DIAGNOSIS — R54 Age-related physical debility: Secondary | ICD-10-CM | POA: Diagnosis present

## 2023-09-28 DIAGNOSIS — K7290 Hepatic failure, unspecified without coma: Secondary | ICD-10-CM | POA: Diagnosis present

## 2023-09-28 DIAGNOSIS — Z87891 Personal history of nicotine dependence: Secondary | ICD-10-CM

## 2023-09-28 DIAGNOSIS — Z79899 Other long term (current) drug therapy: Secondary | ICD-10-CM

## 2023-09-28 DIAGNOSIS — K7689 Other specified diseases of liver: Secondary | ICD-10-CM

## 2023-09-28 DIAGNOSIS — G9341 Metabolic encephalopathy: Secondary | ICD-10-CM | POA: Diagnosis present

## 2023-09-28 DIAGNOSIS — E872 Acidosis, unspecified: Secondary | ICD-10-CM | POA: Diagnosis present

## 2023-09-28 DIAGNOSIS — N1831 Chronic kidney disease, stage 3a: Secondary | ICD-10-CM

## 2023-09-28 DIAGNOSIS — Z833 Family history of diabetes mellitus: Secondary | ICD-10-CM

## 2023-09-28 DIAGNOSIS — I959 Hypotension, unspecified: Secondary | ICD-10-CM

## 2023-09-28 DIAGNOSIS — J9601 Acute respiratory failure with hypoxia: Secondary | ICD-10-CM | POA: Diagnosis present

## 2023-09-28 DIAGNOSIS — C799 Secondary malignant neoplasm of unspecified site: Secondary | ICD-10-CM

## 2023-09-28 DIAGNOSIS — R578 Other shock: Secondary | ICD-10-CM | POA: Diagnosis present

## 2023-09-28 DIAGNOSIS — Z66 Do not resuscitate: Secondary | ICD-10-CM | POA: Diagnosis not present

## 2023-09-28 DIAGNOSIS — Z7189 Other specified counseling: Secondary | ICD-10-CM

## 2023-09-28 DIAGNOSIS — T508X5A Adverse effect of diagnostic agents, initial encounter: Secondary | ICD-10-CM | POA: Diagnosis present

## 2023-09-28 DIAGNOSIS — D649 Anemia, unspecified: Secondary | ICD-10-CM | POA: Diagnosis present

## 2023-09-28 DIAGNOSIS — Z888 Allergy status to other drugs, medicaments and biological substances status: Secondary | ICD-10-CM

## 2023-09-28 DIAGNOSIS — Z905 Acquired absence of kidney: Secondary | ICD-10-CM

## 2023-09-28 DIAGNOSIS — Z87442 Personal history of urinary calculi: Secondary | ICD-10-CM

## 2023-09-28 DIAGNOSIS — Z881 Allergy status to other antibiotic agents status: Secondary | ICD-10-CM

## 2023-09-28 DIAGNOSIS — C787 Secondary malignant neoplasm of liver and intrahepatic bile duct: Secondary | ICD-10-CM | POA: Diagnosis present

## 2023-09-28 DIAGNOSIS — Z86711 Personal history of pulmonary embolism: Secondary | ICD-10-CM

## 2023-09-28 LAB — CBC WITH DIFFERENTIAL/PLATELET
Abs Immature Granulocytes: 0.05 K/uL (ref 0.00–0.07)
Basophils Absolute: 0 K/uL (ref 0.0–0.1)
Basophils Relative: 0 %
Eosinophils Absolute: 0.2 K/uL (ref 0.0–0.5)
Eosinophils Relative: 2 %
HCT: 47 % (ref 39.0–52.0)
Hemoglobin: 14.8 g/dL (ref 13.0–17.0)
Immature Granulocytes: 1 %
Lymphocytes Relative: 10 %
Lymphs Abs: 0.9 K/uL (ref 0.7–4.0)
MCH: 30.8 pg (ref 26.0–34.0)
MCHC: 31.5 g/dL (ref 30.0–36.0)
MCV: 97.7 fL (ref 80.0–100.0)
Monocytes Absolute: 0.6 K/uL (ref 0.1–1.0)
Monocytes Relative: 7 %
Neutro Abs: 7.2 K/uL (ref 1.7–7.7)
Neutrophils Relative %: 80 %
Platelets: 408 K/uL — ABNORMAL HIGH (ref 150–400)
RBC: 4.81 MIL/uL (ref 4.22–5.81)
RDW: 15.6 % — ABNORMAL HIGH (ref 11.5–15.5)
WBC: 9 K/uL (ref 4.0–10.5)
nRBC: 0 % (ref 0.0–0.2)

## 2023-09-28 LAB — URINALYSIS, ROUTINE W REFLEX MICROSCOPIC
Bacteria, UA: NONE SEEN
Bilirubin Urine: NEGATIVE
Glucose, UA: NEGATIVE mg/dL
Hgb urine dipstick: NEGATIVE
Ketones, ur: NEGATIVE mg/dL
Leukocytes,Ua: NEGATIVE
Nitrite: NEGATIVE
Protein, ur: 100 mg/dL — AB
Specific Gravity, Urine: 1.043 — ABNORMAL HIGH (ref 1.005–1.030)
pH: 5 (ref 5.0–8.0)

## 2023-09-28 LAB — COMPREHENSIVE METABOLIC PANEL WITH GFR
ALT: 95 U/L — ABNORMAL HIGH (ref 0–44)
AST: 195 U/L — ABNORMAL HIGH (ref 15–41)
Albumin: 3.3 g/dL — ABNORMAL LOW (ref 3.5–5.0)
Alkaline Phosphatase: 179 U/L — ABNORMAL HIGH (ref 38–126)
Anion gap: 17 — ABNORMAL HIGH (ref 5–15)
BUN: 26 mg/dL — ABNORMAL HIGH (ref 6–20)
CO2: 19 mmol/L — ABNORMAL LOW (ref 22–32)
Calcium: 9.1 mg/dL (ref 8.9–10.3)
Chloride: 99 mmol/L (ref 98–111)
Creatinine, Ser: 2.64 mg/dL — ABNORMAL HIGH (ref 0.61–1.24)
GFR, Estimated: 30 mL/min — ABNORMAL LOW (ref >60)
Glucose, Bld: 146 mg/dL — ABNORMAL HIGH (ref 70–99)
Potassium: 4.8 mmol/L (ref 3.5–5.1)
Sodium: 136 mmol/L (ref 135–145)
Total Bilirubin: 1.4 mg/dL — ABNORMAL HIGH (ref 0.0–1.2)
Total Protein: 6.3 g/dL — ABNORMAL LOW (ref 6.5–8.1)

## 2023-09-28 LAB — CREATININE, URINE, RANDOM: Creatinine, Urine: 328 mg/dL

## 2023-09-28 LAB — MAGNESIUM: Magnesium: 1.7 mg/dL (ref 1.7–2.4)

## 2023-09-28 LAB — SODIUM, URINE, RANDOM: Sodium, Ur: <30 mmol/L

## 2023-09-28 MED ORDER — ONDANSETRON HCL 4 MG PO TABS: 4.0000 mg | ORAL_TABLET | Freq: Four times a day (QID) | ORAL | Status: DC | PRN

## 2023-09-28 MED ORDER — OXYCODONE HCL ER 10 MG PO T12A
10.0000 mg | EXTENDED_RELEASE_TABLET | Freq: Two times a day (BID) | ORAL | Status: DC
  Administered 2023-09-28 – 2023-10-01 (×6): 10 mg via ORAL
  Filled 2023-09-28 (×6): qty 1

## 2023-09-28 MED ORDER — ONDANSETRON HCL 4 MG/2ML IJ SOLN
4.0000 mg | Freq: Once | INTRAMUSCULAR | Status: AC
  Administered 2023-09-28: 4 mg via INTRAVENOUS
  Filled 2023-09-28: qty 2

## 2023-09-28 MED ORDER — ACETAMINOPHEN 650 MG RE SUPP: 650.0000 mg | Freq: Four times a day (QID) | RECTAL | Status: DC | PRN

## 2023-09-28 MED ORDER — HYDROMORPHONE HCL 1 MG/ML IJ SOLN
0.5000 mg | Freq: Once | INTRAMUSCULAR | Status: AC
  Administered 2023-09-28: 0.5 mg via INTRAVENOUS
  Filled 2023-09-28: qty 1

## 2023-09-28 MED ORDER — OXYCODONE HCL 5 MG PO TABS: 5.0000 mg | ORAL_TABLET | Freq: Four times a day (QID) | ORAL | Status: DC | PRN

## 2023-09-28 MED ORDER — LORAZEPAM 0.5 MG PO TABS: 0.5000 mg | ORAL_TABLET | Freq: Every evening | ORAL | Status: DC | PRN

## 2023-09-28 MED ORDER — ALBUTEROL SULFATE HFA 108 (90 BASE) MCG/ACT IN AERS: 2.0000 | INHALATION_SPRAY | Freq: Four times a day (QID) | RESPIRATORY_TRACT | Status: DC | PRN

## 2023-09-28 MED ORDER — SENNOSIDES-DOCUSATE SODIUM 8.6-50 MG PO TABS
1.0000 | ORAL_TABLET | Freq: Two times a day (BID) | ORAL | Status: DC
  Administered 2023-09-28 – 2023-10-04 (×7): 1 via ORAL
  Filled 2023-09-28 (×7): qty 1

## 2023-09-28 MED ORDER — HYDRALAZINE HCL 20 MG/ML IJ SOLN: 10.0000 mg | Freq: Four times a day (QID) | INTRAMUSCULAR | Status: DC | PRN

## 2023-09-28 MED ORDER — METOPROLOL SUCCINATE ER 25 MG PO TB24
50.0000 mg | ORAL_TABLET | Freq: Every day | ORAL | Status: DC
Start: 2023-09-28 — End: 2023-10-02
  Administered 2023-09-28 – 2023-09-30 (×3): 50 mg via ORAL
  Filled 2023-09-28 (×3): qty 1

## 2023-09-28 MED ORDER — CABOZANTINIB S-MALATE 60 MG PO TABS
60.0000 mg | ORAL_TABLET | Freq: Every day | ORAL | Status: DC
  Administered 2023-09-28: 60 mg via ORAL
  Filled 2023-09-28 (×2): qty 1

## 2023-09-28 MED ORDER — BELZUTIFAN 40 MG PO TABS
120.0000 mg | ORAL_TABLET | Freq: Every day | ORAL | Status: DC
  Administered 2023-09-30 – 2023-10-01 (×2): 120 mg via ORAL
  Filled 2023-09-28 (×3): qty 3

## 2023-09-28 MED ORDER — SODIUM CHLORIDE 0.9 % IV SOLN: INTRAVENOUS | Status: DC

## 2023-09-28 MED ORDER — SORBITOL 70 % SOLN: 30.0000 mL | Freq: Every day | Status: DC | PRN

## 2023-09-28 MED ORDER — PANTOPRAZOLE SODIUM 40 MG PO TBEC
40.0000 mg | DELAYED_RELEASE_TABLET | Freq: Every day | ORAL | Status: DC
  Administered 2023-09-28 – 2023-10-01 (×4): 40 mg via ORAL
  Filled 2023-09-28 (×4): qty 1

## 2023-09-28 MED ORDER — APIXABAN 5 MG PO TABS
5.0000 mg | ORAL_TABLET | Freq: Two times a day (BID) | ORAL | Status: DC
  Administered 2023-09-28 – 2023-10-01 (×6): 5 mg via ORAL
  Filled 2023-09-28 (×6): qty 1

## 2023-09-28 MED ORDER — HYDROMORPHONE HCL 1 MG/ML IJ SOLN
1.0000 mg | INTRAMUSCULAR | Status: DC | PRN
Start: 2023-09-28 — End: 2023-10-01
  Administered 2023-09-28 – 2023-10-01 (×11): 1 mg via INTRAVENOUS
  Filled 2023-09-28 (×11): qty 1

## 2023-09-28 MED ORDER — POLYETHYLENE GLYCOL 3350 17 G PO PACK: 17.0000 g | PACK | Freq: Every day | ORAL | Status: DC | PRN

## 2023-09-28 MED ORDER — HYDROMORPHONE HCL 1 MG/ML IJ SOLN
1.0000 mg | Freq: Once | INTRAMUSCULAR | Status: AC
  Administered 2023-09-28: 1 mg via INTRAVENOUS
  Filled 2023-09-28: qty 1

## 2023-09-28 MED ORDER — OXYCODONE HCL 5 MG PO TABS
5.0000 mg | ORAL_TABLET | ORAL | Status: DC | PRN
  Administered 2023-09-28: 10 mg via ORAL
  Administered 2023-09-29: 5 mg via ORAL
  Filled 2023-09-28: qty 2
  Filled 2023-09-28: qty 1

## 2023-09-28 MED ORDER — ACETAMINOPHEN 325 MG PO TABS: 650.0000 mg | ORAL_TABLET | Freq: Four times a day (QID) | ORAL | Status: DC | PRN

## 2023-09-28 MED ORDER — LORATADINE 10 MG PO TABS: 10.0000 mg | ORAL_TABLET | Freq: Every day | ORAL | Status: DC

## 2023-09-28 MED ORDER — LACTATED RINGERS IV BOLUS
1000.0000 mL | Freq: Once | INTRAVENOUS | Status: AC
  Administered 2023-09-28: 1000 mL via INTRAVENOUS

## 2023-09-28 MED ORDER — SODIUM CHLORIDE 0.9% FLUSH
3.0000 mL | Freq: Two times a day (BID) | INTRAVENOUS | Status: DC
  Administered 2023-09-28 – 2023-10-04 (×12): 3 mL via INTRAVENOUS

## 2023-09-28 MED ORDER — ONDANSETRON HCL 4 MG/2ML IJ SOLN: 4.0000 mg | Freq: Four times a day (QID) | INTRAMUSCULAR | Status: DC | PRN

## 2023-09-28 NOTE — H&P (Signed)
 History and Physical    JAVED COTTO FMW:[id number] DOB: September 14, 1980 DOA: 09/28/2023  PCP: Norleen Lynwood ORN, MD  Patient coming from: Home  I have personally briefly reviewed patient's old medical records in [facility]  Chief Complaint: Uncontrolled abdominal pain.  HPI: Marcus Burns is a 43 y.o. 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  Patient noted to have been seen in the ED 1 day prior to admission with ongoing abdominal pain, CT abdomen and pelvis done noted extensive intrahepatic metastases filling entire hepatic parenchyma, worsening peritoneal metastatic disease, intramuscular metastatic disease along the inferior right rectus sheath, nearly occlusive thrombus within the intrahepatic IVC.  Patient noted to have received IV Dilaudid  in the ED discharged home on oxycodone  however presented back with uncontrolled pain.  ED physician discussed with patient's primary oncologist who recommended admission for pain control and placement on belzutifan  120 mg daily and cabozantinib  60 mg daily.  Patient denies any fevers, no chills, no diarrhea, no constipation, no melena, no hematemesis, no hematochezia, no lightheadedness, no dizziness, no syncopal episode.  Patient denied any significant chest pain or shortness of breath.  Patient does endorse bout of nausea and emesis 1 day prior to admission which has since resolved.  Patient also endorses decreased oral intake over the past 48 hours.  Patient denies any dysuria and endorses good urine output.  ED Course: Patient seen in the ED, noted to be tachycardic with heart rates as high as 127.  CBC done with a platelet count of 48  otherwise within normal limits.  Comprehensive metabolic profile done with a bicarb of 19, glucose 146, BUN 26, creatinine 2.64, alk phosphatase 179, albumin  3.3, AST of 195, ALT of 95, protein of 6.3, bilirubin of 1.4 otherwise within normal limits.  Urinalysis done hazy, amber-colored, nitrite negative, leukocytes negative, specific gravity of 1.043.  Patient given a fluid bolus, IV Dilaudid , hospitalist called to admit patient for further evaluation and management.  Review of Systems: As per HPI otherwise all other systems reviewed and are negative.  Past Medical History:  Diagnosis Date   Acute prostatitis 06/19/2007   ALLERGIC RHINITIS 06/19/2007   ANXIETY 06/19/2007   Cancer (HCC)    kidney   ELEVATED BLOOD PRESSURE WITHOUT DIAGNOSIS OF HYPERTENSION 06/19/2007   GERD 06/19/2007   GERD (gastroesophageal reflux disease)    History of COVID-19 01/24/2019   History of kidney stones    Hypertension    Inguinal hernia    Migraines    NEPHROLITHIASIS, HX OF 06/19/2007   PE (pulmonary thromboembolism) (HCC)     Past Surgical History:  Procedure Laterality Date   CYSTOSCOPY WITH RETROGRADE PYELOGRAM, URETEROSCOPY AND STENT PLACEMENT Bilateral 02/09/2019   Procedure: CYSTOSCOPY WITH RETROGRADE PYELOGRAM, DIAGNOSTIC URETEROSCOPY AND STENT PLACEMENT;  Surgeon: Alvaro Hummer, MD;  Location: [facility];  Service: Urology;  Laterality: Bilateral;  75 MINS   CYSTOSCOPY WITH RETROGRADE PYELOGRAM, URETEROSCOPY AND STENT PLACEMENT Bilateral 02/23/2019   Procedure: CYSTOSCOPY WITH RETROGRADE PYELOGRAM, URETEROSCOPY AND STENT PLACEMENT;  Surgeon: Alvaro Hummer, MD;  Location: [facility];  Service: Urology;  Laterality: Bilateral;  1 HR   HERNIA REPAIR     HOLMIUM LASER APPLICATION Bilateral 02/23/2019   Procedure: HOLMIUM LASER APPLICATION;  Surgeon: Alvaro Hummer, MD;  Location: [facility];  Service: Urology;  Laterality: Bilateral;   ROBOT ASSISTED LAPAROSCOPIC NEPHRECTOMY Right 06/18/2022   Procedure: XI  ROBOTIC ASSISTED LAPAROSCOPIC RADICAL NEPHRECTOMY AND PERICAVAL LYMPH NODE DISSECTION;  Surgeon: Alvaro Ricardo KATHEE Mickey., MD;  Location: [facility];  Service: Urology;  Laterality: Right;  3 HRS    Social History  reports that he has quit smoking. He has quit using smokeless tobacco. He reports that he does not drink alcohol and does not use drugs.  Allergies  Allergen Reactions   Amlodipine  Other (See Comments)    Edema ( 5 mg)   Doxycycline  Other (See Comments)    Light headed , passed out    Family History  Problem Relation Age of Onset   Anxiety disorder Mother    Dementia Mother    Coronary artery disease Father    Anxiety disorder Brother    Diabetes Brother    Mother deceased age 79 had a history of dementia and per patient from natural causes.  Patient states father deceased age 65 from acute MI.  Prior to Admission medications   Medication Sig Start Date End Date Taking? Authorizing Provider  acetaminophen  (TYLENOL ) 500 MG tablet Take 1 tablet (500 mg total) by mouth every 6 (six) hours as needed for moderate pain (pain.). 10/14/22  Yes Briana Elgin LABOR, MD  apixaban  (ELIQUIS ) 5 MG TABS tablet Take 1 tablet (5 mg total) by mouth 2 (two) times daily. 05/18/23  Yes Iruku, Praveena, MD  cabozantinib  (CABOMETYX ) 60 MG tablet Take 1 tablet (60 mg total) by mouth daily. Take on an empty stomach, 1 hour before or 2 hours after meals. 09/22/23  Yes Tina Pauletta BROCKS, MD  loperamide (IMODIUM) 2 MG capsule Take by mouth as needed for diarrhea or loose stools.   Yes [provider]  LORazepam  (ATIVAN ) 0.5 MG tablet Take 0.5 mg by mouth at bedtime as needed for anxiety or sleep.   Yes [provider]  metoprolol  succinate (TOPROL -XL) 50 MG 24 hr tablet Take 1 tablet (50 mg total) by mouth daily. Take with or immediately following a meal. 12/24/22  Yes Tina Pauletta BROCKS, MD  oxyCODONE  (OXY IR/ROXICODONE ) 5 MG immediate release tablet Take 1 tablet (5 mg total) by mouth every 6 (six)  hours as needed for severe pain (pain score 7-10). 09/27/23  Yes Tina Pauletta BROCKS, MD  telmisartan  (MICARDIS ) 40 MG tablet TAKE 1 TABLET BY MOUTH EVERY DAY 08/26/23  Yes Tina Pauletta BROCKS, MD  traZODone  (DESYREL ) 100 MG tablet Take 1 tablet (100 mg total) by mouth at bedtime as needed for sleep. 09/22/23  Yes Tina Pauletta BROCKS, MD  albuterol  (VENTOLIN  HFA) 108 [phone] Base) MCG/ACT inhaler Inhale 2 puffs into the lungs every 6 (six) hours as needed for wheezing or shortness of breath. Patient not taking: Reported on 09/28/2023 12/24/21   Norleen Lynwood ORN, MD  belzutifan  (WELIREG ) 40 MG tablet Take 3 tablets (120 mg total) by mouth daily. 09/22/23   Tina Pauletta BROCKS, MD  cetirizine  (ZYRTEC ) 10 MG tablet Take 10 mg by mouth daily. Patient not taking: Reported on 09/28/2023    [provider]  docusate sodium  (COLACE) 100 MG capsule Take 1 capsule (100 mg total) by mouth 2 (two) times daily. Patient not taking: Reported on 09/28/2023 06/18/22   Cory Palma, PA-C  triamcinolone  (NASACORT ) 55 MCG/ACT AERO nasal inhaler Place 2 sprays into the nose daily. Patient not taking: Reported on 02/01/2019 05/13/17 02/01/19  Norleen Lynwood ORN, MD    Physical Exam: Vitals:  09/28/23 1700 09/28/23 1715 09/28/23 1730 09/28/23 1749  BP: 116/69  116/74 116/74  Pulse: (!) 120  (!) 127 (!) 127  Resp:   16   Temp:   98 F (36.7 C)   TempSrc:   Oral   SpO2: 90% 94% 92%     Constitutional: NAD, calm, comfortable Vitals:   09/28/23 1700 09/28/23 1715 09/28/23 1730 09/28/23 1749  BP: 116/69  116/74 116/74  Pulse: (!) 120  (!) 127 (!) 127  Resp:   16   Temp:   98 F (36.7 C)   TempSrc:   Oral   SpO2: 90% 94% 92%    Eyes: PERRL, lids and conjunctivae normal ENMT: Mucous membranes are dry. Posterior pharynx clear of any exudate or lesions.Normal dentition.  Neck: normal, supple, no masses, no thyromegaly Respiratory: clear to auscultation bilaterally, no wheezing, no crackles. Normal respiratory effort. No accessory  muscle use.  Cardiovascular: Tachycardia.  No murmurs rubs or gallops.  2-3+ bilateral lower extremity edema.  No carotid bruits.  Abdomen: Abdomen is soft, some diffuse tenderness to palpation, positive bowel sounds.  No rebound.  No guarding.  Musculoskeletal: no clubbing / cyanosis. No joint deformity upper and lower extremities. Good ROM, no contractures. Normal muscle tone.  Skin: no rashes, lesions, ulcers. No induration Neurologic: CN 2-12 grossly intact. Sensation intact, DTR normal. Strength 5/5 in all 4.  Psychiatric: Normal judgment and insight. Alert and oriented x 3. Normal mood.   Labs on Admission: I have personally reviewed following labs and imaging studies  CBC: Recent Labs  Lab 09/22/23 0803 09/27/23 0855 09/28/23 1032  WBC 3.2* 5.8 9.0  NEUTROABS 1.8 4.1 7.2  HGB 12.3* 13.6 14.8  HCT 37.4* 44.4 47.0  MCV 93.0 95.7 97.7  PLT 269 319 408*    Basic Metabolic Panel: Recent Labs  Lab 09/22/23 0803 09/27/23 0855 09/28/23 1032  NA 138 135 136  K 3.9 4.2 4.8  CL 106 101 99  CO2 26 19* 19*  GLUCOSE 89 121* 146*  BUN 16 21* 26*  CREATININE 1.37* 1.84* 2.64*  CALCIUM 8.7* 9.2 9.1    GFR: Estimated Creatinine Clearance: 48.1 mL/min (A) (by C-G formula based on SCr of 2.64 mg/dL (H)).  Liver Function Tests: Recent Labs  Lab 09/22/23 0803 09/27/23 0855 09/28/23 1032  AST 112* 178* 195*  ALT 43 77* 95*  ALKPHOS 131* 181* 179*  BILITOT 0.8 1.0 1.4*  PROT 6.2* 6.1* 6.3*  ALBUMIN  3.2* 3.2* 3.3*    Urine analysis:    Component Value Date/Time   COLORURINE AMBER (A) 09/28/2023 1404   APPEARANCEUR HAZY (A) 09/28/2023 1404   LABSPEC 1.043 (H) 09/28/2023 1404   PHURINE 5.0 09/28/2023 1404   GLUCOSEU NEGATIVE 09/28/2023 1404   GLUCOSEU NEGATIVE 01/22/2022 1037   HGBUR NEGATIVE 09/28/2023 1404   BILIRUBINUR NEGATIVE 09/28/2023 1404   KETONESUR NEGATIVE 09/28/2023 1404   PROTEINUR 100 (A) 09/28/2023 1404   UROBILINOGEN 0.2 01/22/2022 1037   NITRITE  NEGATIVE 09/28/2023 1404   LEUKOCYTESUR NEGATIVE 09/28/2023 1404    Radiological Exams on Admission: CT ABDOMEN PELVIS W CONTRAST Result Date: 09/27/2023 CLINICAL DATA:  Abdominal pain, acute, nonlocalized EXAM: CT ABDOMEN AND PELVIS WITH CONTRAST TECHNIQUE: Multidetector CT imaging of the abdomen and pelvis was performed using the standard protocol following bolus administration of intravenous contrast. RADIATION DOSE REDUCTION: This exam was performed according to the departmental dose-optimization program which includes automated exposure control, adjustment of the mA and/or kV according to patient size and/or use  of iterative reconstruction technique. CONTRAST:  80mL OMNIPAQUE  IOHEXOL  300 MG/ML  SOLN COMPARISON:  09/16/2023 FINDINGS: Lower chest: Trace right pleural effusion with right basilar atelectasis.Unchanged 2 mm left lower lobe nodule abutting the hemidiaphragm (axial 42). Hepatobiliary: Enlarged liver, which is full of innumerable hypodense masses, without significant interval change.For example, the largest mass in the central right hepatic lobe measures 5.7 x 8.4 cm (axial 38), previously measuring 5.2 x 6.6 cm.The gallbladder was not well visualized or evaluated. No intrahepatic or extrahepatic biliary ductal dilation. The portal veins are patent. Pancreas: No mass or main ductal dilation. No peripancreatic inflammation or fluid collection. Spleen: Normal size. No mass. Adrenals/Urinary Tract: No adrenal masses. The right kidney is surgically absent. No nephrolithiasis or hydronephrosis. The urinary bladder is completely decompressed. Stomach/Bowel: The stomach is decompressed without focal abnormality. No small bowel wall thickening or inflammation. No small bowel obstruction.Normal appendix. Vascular/Lymphatic: No aortic aneurysm. No intraabdominal or pelvic lymphadenopathy. Nearly occlusive thrombus again noted within the intrahepatic IVC (axial 45). Reproductive: No prostatomegaly.Small  volume free fluid in the pelvis. Other: Peritoneal nodule along the ventral abdominal wall measures 2.2 x 4.1 cm (previously 1.7 x 3.3 cm), axial 64. multiple additional small areas of nodularity in the epigastric fat adjacent to the ventral abdominal wall (axial 55), more prominent than on the prior study. No pneumoperitoneum. Peritoneal thickening in the right paracolic gutter is also noted (axial 79). Mixed gas and fluid collection in the hepatorenal fossa is otherwise unchanged. Musculoskeletal: No acute fracture or destructive lesion. Moderate anasarca. Intramuscular metastatic disease in the inferior right rectus sheath measuring 1.4 cm (axial 76). IMPRESSION: 1. No acute intra-abdominal or pelvic abnormality. 2. Extensive intrahepatic metastases again noted filling the entire hepatic parenchyma, with slight enlargement of the index mass in the central right hepatic lobe, measuring 5.7 x 8.4 cm (previously, 5.2 x 6.6 cm, axial 38). 3. Worsening of the peritoneal metastatic disease. For example, the peritoneal nodule subjacent to the midline ventral incision (axial 64), measures 2.2 x 4.1 cm (previously, 1.7 x 3.3 cm). Trace malignant ascites in the pelvis. 4. Intramuscular metastatic disease along the inferior right rectus sheath measuring 1.4 cm (axial 76). 5. Similar appearance of the nearly occlusive thrombus within the intrahepatic IVC. Electronically Signed   By: Rogelia Myers M.D.   On: 09/27/2023 12:17    EKG: Not done.  Assessment/Plan Principal Problem:   Uncontrolled pain Active Problems:   ARF (acute renal failure)   Metastatic renal cell carcinoma (HCC)   Anxiety   GERD   Hypertension   Anemia   Clear cell carcinoma of kidney (HCC)   CKD (chronic kidney disease)   Sinus tachycardia   #1 uncontrolled pain secondary to metastatic renal cell carcinoma to the liver, lungs, peritoneum - Patient presenting back with uncontrolled abdominal pain secondary to metastatic renal cell  carcinoma to the liver and lungs peritoneum. - CT abdomen and pelvis done 1 day prior to admission with extensive liver metastases, filling the entire hepatic parenchyma, slight enlargement of the index mass in the central right hepatic lobe measuring 5.7 x 8.4 cm.  Worsening of the peritoneal metastatic disease.  Trace malignant ascites in the pelvis.  Intramuscular metastatic disease along the inferior right rectus sheath measuring 1.4 cm.  Similar appearance of newly occlusive thrombus within the intrahepatic IVC - ED physician discussed with patient's primary oncologist Dr. Tina who recommended starting patient on belzutifan  120 mg daily, cabozantinib  60 mg daily and pain management. - Start OxyContin  10 mg  twice daily. - Place on oxycodone  for breakthrough pain. - IV Dilaudid  as needed. - Oncology informed via epic of patient's admission.  2.  AKI on CKD stage IIIa -Patient with creatinine of 2.64 today from 1.84 on 09/27/2023. - Baseline creatinine approximately 1.3-1.9. - Likely secondary to prerenal azotemia in the setting of ARB versus contrast-induced with recent CT abdomen and pelvis. - Patient does endorse poor oral intake over the past 48 hours. - Urinalysis done, nitrite negative, leukocytes negative, increased specific gravity. - Urine sodium ordered noted at < 30, urine creatinine 328. -CT abdomen and pelvis done on 09/27/2023 with solitary kidney, negative for hydronephrosis. - Patient noted to have received 1 L bolus of LR. - Place on normal saline 125 cc an hour. - Monitor urine output. - Hold ARB.  3.  Transaminitis -Secondary to metastatic disease.  4.  Hypertension -Patient states has been taken off all his antihypertensive medications.   -Resume home regimen Toprol -XL as patient with a sinus tachycardia.. - Hold ARB.  5.  History of pulmonary embolism -Continue home regimen Eliquis .  6.  Sinus tachycardia -Likely secondary to problem #1 as patient with  uncontrolled abdominal pain. - Patient noted to be on a beta-blocker prior to admission which we will resume. - Continue IV fluids, pain management, supportive care.  7.  GERD -PPI.  8.  Anxiety - Resume home regimen and Ativan .  DVT prophylaxis: Eliquis  Code Status:   Full Family Communication:  Updated patient.  No family at bedside. Disposition Plan:   Patient is from:  Home  Anticipated DC to:  Home  Anticipated DC date:  TBD  Anticipated DC barriers: Pain control/improvement with renal function  Consults called:  Oncology: Dr. Tina notified via epic of admission. Admission status:  Place in observation/telemetry  Severity of Illness: The appropriate patient status for this patient is OBSERVATION. Observation status is judged to be reasonable and necessary in order to provide the required intensity of service to ensure the patient's safety. The patient's presenting symptoms, physical exam findings, and initial radiographic and laboratory data in the context of their medical condition is felt to place them at decreased risk for further clinical deterioration. Furthermore, it is anticipated that the patient will be medically stable for discharge from the hospital within 2 midnights of admission.     Toribio Hummer MD [facility]  How to contact the [facility] Attending or Consulting provider 7A - 7P or covering provider during after hours 7P -7A, for this patient?   Check the care team in [facility] and look for a) attending/consulting [facility] provider listed and b) the [facility] team listed Log into [email or web address] and use 's universal password to access. If you do not have the password, please contact the hospital operator. Locate the [facility] provider you are looking for under [facility] and page to a number that you can be directly reached. If you still have difficulty reaching the provider, please page the [facility] (Director on Call) for the Hospitalists listed on amion for  assistance.  09/28/2023, 6:42 PM

## 2023-09-28 NOTE — ED Notes (Signed)
 Pt given urinal and is aware that we need urine sample.

## 2023-09-28 NOTE — ED Triage Notes (Signed)
 Pt reports with RUQ pain that started back up last night. Pt reports mets to the liver.

## 2023-09-28 NOTE — ED Notes (Signed)
 Asked pt about urine sample and he says he is unable to urinate at this time

## 2023-09-28 NOTE — Telephone Encounter (Signed)
 Oral Chemotherapy Pharmacist Encounter  I spoke with patient for overview of: Cabometyx  and Welireg  for the treatment of metastatic clear cell renal cell carcinoma, planned duration until disease progression or unacceptable toxicity.  Treatment goal: Palliative  Counseled patient on administration, dosing, side effects, monitoring, drug-food interactions, safe handling, storage, and disposal.  Patient will take Cabometyx  60mg  tablets, 1 tablet (60mg ) by mouth once daily on an empty stomach, 1 hour before or 2 hours after a meal. Patient knows to avoid grapefruit and grapefruit juice.Patient will also take Welireg  120mg  tablets, 1 tablet by mouth daily.   Cabometyx  start date: 09/24/2023 Welireg  start date: 09/29/2023   Adverse effects for cabometyx  include but are not limited to: diarrhea, nausea, decreased appetite, fatigue, hypertension, hand-foot syndrome, decreased blood counts, and electrolyte abnormalities.Adverse effects for welireg  include nausea, headache, decreased blood counts, serious birth defects, hypoxia, and changes in kidney function. Patient will obtain anti diarrheal and alert the office of 4 or more loose stools above baseline.  Patient informed that Cabometyx  should be held at least 3 weeks prior to any scheduled surgery (including dental surgery) and not resumed until at least 2 weeks after major surgery and until adequate wound healing is established.  Reviewed with patient importance of keeping a medication schedule and plan for any missed doses. No barriers to medication adherence identified.  Medication reconciliation performed and medication/allergy list updated.  Distress thermometer not completed during telephone call as patient has been on previous lines of therapy.   Communication and Learning Assessment Primary learner: Patient Barriers to learning: No barriers Preferred language: English Learning preferences: Listening Reading  All questions answered.  Patients wife voiced understanding and appreciation. Medication education handout placed in mail for patient. Patient knows to call the office with questions or concerns. Oral Chemotherapy Clinic phone number provided to patient.   Naome Brigandi, PharmD Hematology/Oncology Clinical Pharmacist [facility] [phone]

## 2023-09-28 NOTE — Telephone Encounter (Signed)
 Wife states they are going back to the ED. Sheridan has taken the Oxycodone  every 6 hours throughout the night with no relief from pain. He is having difficulty breathing due to the pain and having trouble moving  [facility] ED charge nurse notified.

## 2023-09-28 NOTE — ED Provider Notes (Addendum)
 [place] EMERGENCY DEPARTMENT AT [facility] Provider Note   CSN: [id number] Arrival date & time: 09/28/23  1006     Patient presents with: Pain   Marcus Burns is a 43 y.o. male.   HPI Patient with abdominal pain.  Has a history of renal cancer but it is widely metastatic in the abdomen.  Particularly in his liver.  Increased pain recently.  Had been seen in the ER yesterday.  It appears that most of his liver has been replaced by tumor.  Has recently started treatment a few days ago and now pain increased.  Does not normally have much pain.  Had been given Dilaudid  in the ER and then discharged on oxycodone .  Pain is returned and is uncontrolled at home   Past Medical History:  Diagnosis Date  . Acute prostatitis 06/19/2007  . ALLERGIC RHINITIS 06/19/2007  . ANXIETY 06/19/2007  . Cancer (HCC)    kidney  . ELEVATED BLOOD PRESSURE WITHOUT DIAGNOSIS OF HYPERTENSION 06/19/2007  . GERD 06/19/2007  . GERD (gastroesophageal reflux disease)   . History of COVID-19 01/24/2019  . History of kidney stones   . Hypertension   . Inguinal hernia   . Migraines   . NEPHROLITHIASIS, HX OF 06/19/2007  . PE (pulmonary thromboembolism) (HCC)     Prior to Admission medications   Medication Sig Start Date End Date Taking? Authorizing Provider  acetaminophen  (TYLENOL ) 500 MG tablet Take 1 tablet (500 mg total) by mouth every 6 (six) hours as needed for moderate pain (pain.). 10/14/22   Briana Elgin LABOR, MD  albuterol  (VENTOLIN  HFA) 108 (90 Base) MCG/ACT inhaler Inhale 2 puffs into the lungs every 6 (six) hours as needed for wheezing or shortness of breath. 12/24/21   Norleen Lynwood ORN, MD  apixaban  (ELIQUIS ) 5 MG TABS tablet Take 1 tablet (5 mg total) by mouth 2 (two) times daily. 05/18/23   Iruku, Praveena, MD  belzutifan  (WELIREG ) 40 MG tablet Take 3 tablets (120 mg total) by mouth daily. 09/22/23   Tina Pauletta BROCKS, MD  cabozantinib  (CABOMETYX ) 60 MG tablet Take 1 tablet (60 mg total)  by mouth daily. Take on an empty stomach, 1 hour before or 2 hours after meals. 09/22/23   Tina Pauletta BROCKS, MD  cetirizine  (ZYRTEC ) 10 MG tablet Take 10 mg by mouth daily.    [provider]  docusate sodium  (COLACE) 100 MG capsule Take 1 capsule (100 mg total) by mouth 2 (two) times daily. Patient taking differently: Take 100 mg by mouth as needed for mild constipation or moderate constipation. 06/18/22   Cory Palma, PA-C  loperamide (IMODIUM) 2 MG capsule Take by mouth as needed for diarrhea or loose stools.    [provider]  LORazepam  (ATIVAN ) 0.5 MG tablet Take 0.5 mg by mouth at bedtime as needed for anxiety or sleep.    [provider]  metoprolol  succinate (TOPROL -XL) 50 MG 24 hr tablet Take 1 tablet (50 mg total) by mouth daily. Take with or immediately following a meal. 12/24/22   Tina Pauletta BROCKS, MD  oxyCODONE  (OXY IR/ROXICODONE ) 5 MG immediate release tablet Take 1 tablet (5 mg total) by mouth every 6 (six) hours as needed for severe pain (pain score 7-10). 09/27/23   Tina Pauletta BROCKS, MD  telmisartan  (MICARDIS ) 40 MG tablet TAKE 1 TABLET BY MOUTH EVERY DAY 08/26/23   Tina Pauletta BROCKS, MD  traZODone  (DESYREL ) 100 MG tablet Take 1 tablet (100 mg total) by mouth at bedtime as  needed for sleep. 09/22/23   Tina Pauletta BROCKS, MD  triamcinolone  (NASACORT ) 55 MCG/ACT AERO nasal inhaler Place 2 sprays into the nose daily. Patient not taking: Reported on 02/01/2019 05/13/17 02/01/19  Norleen Lynwood ORN, MD    Allergies: Amlodipine  and Doxycycline     Review of Systems  Updated Vital Signs BP (!) 130/95 (BP Location: Left Arm)   Pulse (!) 119   Temp (!) 97.4 F (36.3 C) (Oral)   Resp 16   SpO2 98%   Physical Exam Vitals and nursing note reviewed.  HENT:     Head: Normocephalic.  Cardiovascular:     Rate and Rhythm: Tachycardia present.  Abdominal:     Tenderness: There is abdominal tenderness.     Comments: Hepatomegaly with reproducible pain in right upper abdomen.   Skin:    Capillary Refill: Capillary refill takes less than 2 seconds.  Neurological:     Mental Status: He is alert and oriented to person, place, and time.     (all labs ordered are listed, but only abnormal results are displayed) Labs Reviewed  CBC WITH DIFFERENTIAL/PLATELET - Abnormal; Notable for the following components:      Result Value   RDW 15.6 (*)    Platelets 408 (*)    All other components within normal limits  COMPREHENSIVE METABOLIC PANEL WITH GFR - Abnormal; Notable for the following components:   CO2 19 (*)    Glucose, Bld 146 (*)    BUN 26 (*)    Creatinine, Ser 2.64 (*)    Total Protein 6.3 (*)    Albumin  3.3 (*)    AST 195 (*)    ALT 95 (*)    Alkaline Phosphatase 179 (*)    Total Bilirubin 1.4 (*)    GFR, Estimated 30 (*)    Anion gap 17 (*)    All other components within normal limits    EKG: None  Radiology: CT ABDOMEN PELVIS W CONTRAST Result Date: 09/27/2023 CLINICAL DATA:  Abdominal pain, acute, nonlocalized EXAM: CT ABDOMEN AND PELVIS WITH CONTRAST TECHNIQUE: Multidetector CT imaging of the abdomen and pelvis was performed using the standard protocol following bolus administration of intravenous contrast. RADIATION DOSE REDUCTION: This exam was performed according to the departmental dose-optimization program which includes automated exposure control, adjustment of the mA and/or kV according to patient size and/or use of iterative reconstruction technique. CONTRAST:  80mL OMNIPAQUE  IOHEXOL  300 MG/ML  SOLN COMPARISON:  09/16/2023 FINDINGS: Lower chest: Trace right pleural effusion with right basilar atelectasis.Unchanged 2 mm left lower lobe nodule abutting the hemidiaphragm (axial 42). Hepatobiliary: Enlarged liver, which is full of innumerable hypodense masses, without significant interval change.For example, the largest mass in the central right hepatic lobe measures 5.7 x 8.4 cm (axial 38), previously measuring 5.2 x 6.6 cm.The gallbladder was not  well visualized or evaluated. No intrahepatic or extrahepatic biliary ductal dilation. The portal veins are patent. Pancreas: No mass or main ductal dilation. No peripancreatic inflammation or fluid collection. Spleen: Normal size. No mass. Adrenals/Urinary Tract: No adrenal masses. The right kidney is surgically absent. No nephrolithiasis or hydronephrosis. The urinary bladder is completely decompressed. Stomach/Bowel: The stomach is decompressed without focal abnormality. No small bowel wall thickening or inflammation. No small bowel obstruction.Normal appendix. Vascular/Lymphatic: No aortic aneurysm. No intraabdominal or pelvic lymphadenopathy. Nearly occlusive thrombus again noted within the intrahepatic IVC (axial 45). Reproductive: No prostatomegaly.Small volume free fluid in the pelvis. Other: Peritoneal nodule along the ventral abdominal wall measures 2.2 x 4.1 cm (  previously 1.7 x 3.3 cm), axial 64. multiple additional small areas of nodularity in the epigastric fat adjacent to the ventral abdominal wall (axial 55), more prominent than on the prior study. No pneumoperitoneum. Peritoneal thickening in the right paracolic gutter is also noted (axial 79). Mixed gas and fluid collection in the hepatorenal fossa is otherwise unchanged. Musculoskeletal: No acute fracture or destructive lesion. Moderate anasarca. Intramuscular metastatic disease in the inferior right rectus sheath measuring 1.4 cm (axial 76). IMPRESSION: 1. No acute intra-abdominal or pelvic abnormality. 2. Extensive intrahepatic metastases again noted filling the entire hepatic parenchyma, with slight enlargement of the index mass in the central right hepatic lobe, measuring 5.7 x 8.4 cm (previously, 5.2 x 6.6 cm, axial 38). 3. Worsening of the peritoneal metastatic disease. For example, the peritoneal nodule subjacent to the midline ventral incision (axial 64), measures 2.2 x 4.1 cm (previously, 1.7 x 3.3 cm). Trace malignant ascites in the  pelvis. 4. Intramuscular metastatic disease along the inferior right rectus sheath measuring 1.4 cm (axial 76). 5. Similar appearance of the nearly occlusive thrombus within the intrahepatic IVC. Electronically Signed   By: Rogelia Myers M.D.   On: 09/27/2023 12:17     Procedures   Medications Ordered in the ED  HYDROmorphone  (DILAUDID ) injection 0.5 mg (0.5 mg Intravenous Given 09/28/23 1053)  ondansetron  (ZOFRAN ) injection 4 mg (4 mg Intravenous Given 09/28/23 1053)  HYDROmorphone  (DILAUDID ) injection 0.5 mg (0.5 mg Intravenous Given 09/28/23 1136)    Clinical Course as of 09/28/23 1412  Wed Sep 28, 2023  1411 Called to check on hospitalist consult.  Had been an hour.  However it appears you have not been called.  Now they have called. [NP]    Clinical Course User Index [NP] Patsey Lot, MD                                 Medical Decision Making Amount and/or Complexity of Data Reviewed Labs: ordered.  Risk Prescription drug management. Decision regarding hospitalization.   Patient with abdominal pain.  Right upper quadrant.  Differential diagnose includes causes such as cancer pain.  Also less likely causes such as perforation, obstruction.  Pulmonary medicine felt less likely.  Pain is reproduced in the abdomen.  Reviewed blood work from yesterday.  Reviewed CT scan from yesterday.  I think with recurrent pain patient would benefit from admission to the hospital.  For IV pain control with transition to orals.  Creatinine has increased.  Was 1.8 yesterday now up to 2.6.     Final diagnoses:  Cancer associated pain  AKI (acute kidney injury)    ED Discharge Orders     None          Patsey Lot, MD 09/28/23 1212    Patsey Lot, MD 09/28/23 1255

## 2023-09-29 DIAGNOSIS — F419 Anxiety disorder, unspecified: Secondary | ICD-10-CM

## 2023-09-29 DIAGNOSIS — E669 Obesity, unspecified: Secondary | ICD-10-CM | POA: Diagnosis present

## 2023-09-29 DIAGNOSIS — N1831 Chronic kidney disease, stage 3a: Secondary | ICD-10-CM

## 2023-09-29 DIAGNOSIS — C787 Secondary malignant neoplasm of liver and intrahepatic bile duct: Secondary | ICD-10-CM | POA: Diagnosis present

## 2023-09-29 DIAGNOSIS — I10 Essential (primary) hypertension: Secondary | ICD-10-CM

## 2023-09-29 DIAGNOSIS — N179 Acute kidney failure, unspecified: Secondary | ICD-10-CM | POA: Diagnosis present

## 2023-09-29 DIAGNOSIS — C641 Malignant neoplasm of right kidney, except renal pelvis: Secondary | ICD-10-CM | POA: Diagnosis not present

## 2023-09-29 DIAGNOSIS — N1832 Chronic kidney disease, stage 3b: Secondary | ICD-10-CM | POA: Diagnosis present

## 2023-09-29 DIAGNOSIS — C786 Secondary malignant neoplasm of retroperitoneum and peritoneum: Secondary | ICD-10-CM | POA: Diagnosis present

## 2023-09-29 DIAGNOSIS — K219 Gastro-esophageal reflux disease without esophagitis: Secondary | ICD-10-CM

## 2023-09-29 DIAGNOSIS — R578 Other shock: Secondary | ICD-10-CM | POA: Diagnosis present

## 2023-09-29 DIAGNOSIS — E875 Hyperkalemia: Secondary | ICD-10-CM | POA: Diagnosis present

## 2023-09-29 DIAGNOSIS — J9811 Atelectasis: Secondary | ICD-10-CM | POA: Diagnosis present

## 2023-09-29 DIAGNOSIS — Z66 Do not resuscitate: Secondary | ICD-10-CM | POA: Diagnosis not present

## 2023-09-29 DIAGNOSIS — G893 Neoplasm related pain (acute) (chronic): Secondary | ICD-10-CM | POA: Diagnosis present

## 2023-09-29 DIAGNOSIS — R52 Pain, unspecified: Secondary | ICD-10-CM | POA: Diagnosis not present

## 2023-09-29 DIAGNOSIS — D630 Anemia in neoplastic disease: Secondary | ICD-10-CM | POA: Diagnosis present

## 2023-09-29 DIAGNOSIS — G9341 Metabolic encephalopathy: Secondary | ICD-10-CM | POA: Diagnosis present

## 2023-09-29 DIAGNOSIS — R4589 Other symptoms and signs involving emotional state: Secondary | ICD-10-CM | POA: Diagnosis not present

## 2023-09-29 DIAGNOSIS — I82220 Acute embolism and thrombosis of inferior vena cava: Secondary | ICD-10-CM | POA: Diagnosis present

## 2023-09-29 DIAGNOSIS — R579 Shock, unspecified: Secondary | ICD-10-CM | POA: Diagnosis not present

## 2023-09-29 DIAGNOSIS — Z515 Encounter for palliative care: Secondary | ICD-10-CM | POA: Diagnosis not present

## 2023-09-29 DIAGNOSIS — E871 Hypo-osmolality and hyponatremia: Secondary | ICD-10-CM | POA: Diagnosis present

## 2023-09-29 DIAGNOSIS — J9601 Acute respiratory failure with hypoxia: Secondary | ICD-10-CM | POA: Diagnosis present

## 2023-09-29 DIAGNOSIS — Z79899 Other long term (current) drug therapy: Secondary | ICD-10-CM | POA: Diagnosis not present

## 2023-09-29 DIAGNOSIS — Z8616 Personal history of COVID-19: Secondary | ICD-10-CM | POA: Diagnosis not present

## 2023-09-29 DIAGNOSIS — Z7189 Other specified counseling: Secondary | ICD-10-CM | POA: Diagnosis not present

## 2023-09-29 DIAGNOSIS — E872 Acidosis, unspecified: Secondary | ICD-10-CM | POA: Diagnosis present

## 2023-09-29 DIAGNOSIS — R000 Tachycardia, unspecified: Secondary | ICD-10-CM

## 2023-09-29 DIAGNOSIS — K7290 Hepatic failure, unspecified without coma: Secondary | ICD-10-CM | POA: Diagnosis present

## 2023-09-29 DIAGNOSIS — C7800 Secondary malignant neoplasm of unspecified lung: Secondary | ICD-10-CM | POA: Diagnosis present

## 2023-09-29 DIAGNOSIS — R64 Cachexia: Secondary | ICD-10-CM | POA: Diagnosis present

## 2023-09-29 DIAGNOSIS — C649 Malignant neoplasm of unspecified kidney, except renal pelvis: Secondary | ICD-10-CM | POA: Diagnosis present

## 2023-09-29 DIAGNOSIS — K7689 Other specified diseases of liver: Secondary | ICD-10-CM | POA: Diagnosis not present

## 2023-09-29 DIAGNOSIS — I129 Hypertensive chronic kidney disease with stage 1 through stage 4 chronic kidney disease, or unspecified chronic kidney disease: Secondary | ICD-10-CM | POA: Diagnosis present

## 2023-09-29 LAB — COMPREHENSIVE METABOLIC PANEL WITH GFR
ALT: 162 U/L — ABNORMAL HIGH (ref 0–44)
AST: 334 U/L — ABNORMAL HIGH (ref 15–41)
Albumin: 3.3 g/dL — ABNORMAL LOW (ref 3.5–5.0)
Alkaline Phosphatase: 169 U/L — ABNORMAL HIGH (ref 38–126)
Anion gap: 16 — ABNORMAL HIGH (ref 5–15)
BUN: 33 mg/dL — ABNORMAL HIGH (ref 6–20)
CO2: 19 mmol/L — ABNORMAL LOW (ref 22–32)
Calcium: 8.8 mg/dL — ABNORMAL LOW (ref 8.9–10.3)
Chloride: 98 mmol/L (ref 98–111)
Creatinine, Ser: 3.86 mg/dL — ABNORMAL HIGH (ref 0.61–1.24)
GFR, Estimated: 19 mL/min — ABNORMAL LOW (ref >60)
Glucose, Bld: 121 mg/dL — ABNORMAL HIGH (ref 70–99)
Potassium: 4.5 mmol/L (ref 3.5–5.1)
Sodium: 133 mmol/L — ABNORMAL LOW (ref 135–145)
Total Bilirubin: 1.4 mg/dL — ABNORMAL HIGH (ref 0.0–1.2)
Total Protein: 6.2 g/dL — ABNORMAL LOW (ref 6.5–8.1)

## 2023-09-29 LAB — CBC WITH DIFFERENTIAL/PLATELET
Abs Immature Granulocytes: 0.05 K/uL (ref 0.00–0.07)
Basophils Absolute: 0 K/uL (ref 0.0–0.1)
Basophils Relative: 0 %
Eosinophils Absolute: 0 K/uL (ref 0.0–0.5)
Eosinophils Relative: 0 %
HCT: 47.4 % (ref 39.0–52.0)
Hemoglobin: 14.3 g/dL (ref 13.0–17.0)
Immature Granulocytes: 1 %
Lymphocytes Relative: 10 %
Lymphs Abs: 1 K/uL (ref 0.7–4.0)
MCH: 30 pg (ref 26.0–34.0)
MCHC: 30.2 g/dL (ref 30.0–36.0)
MCV: 99.6 fL (ref 80.0–100.0)
Monocytes Absolute: 1 K/uL (ref 0.1–1.0)
Monocytes Relative: 9 %
Neutro Abs: 8.2 K/uL — ABNORMAL HIGH (ref 1.7–7.7)
Neutrophils Relative %: 80 %
Platelets: 342 K/uL (ref 150–400)
RBC: 4.76 MIL/uL (ref 4.22–5.81)
RDW: 15.7 % — ABNORMAL HIGH (ref 11.5–15.5)
WBC: 10.3 K/uL (ref 4.0–10.5)
nRBC: 0 % (ref 0.0–0.2)

## 2023-09-29 LAB — MAGNESIUM: Magnesium: 1.9 mg/dL (ref 1.7–2.4)

## 2023-09-29 MED ORDER — POLYETHYLENE GLYCOL 3350 17 G PO PACK
17.0000 g | PACK | Freq: Every day | ORAL | Status: DC
  Administered 2023-09-29 – 2023-09-30 (×2): 17 g via ORAL
  Filled 2023-09-29 (×2): qty 1

## 2023-09-29 MED ORDER — CABOZANTINIB S-MALATE 60 MG PO TABS
60.0000 mg | ORAL_TABLET | Freq: Every day | ORAL | Status: DC
  Administered 2023-09-29 – 2023-09-30 (×2): 60 mg via ORAL
  Filled 2023-09-29 (×2): qty 1

## 2023-09-29 MED ORDER — SODIUM CHLORIDE 0.9 % IV BOLUS
500.0000 mL | Freq: Once | INTRAVENOUS | Status: AC
  Administered 2023-09-29: 500 mL via INTRAVENOUS

## 2023-09-29 NOTE — Consult Note (Signed)
 Reason for Consult: Acute kidney injury on chronic kidney disease stage IIIa Referring Physician: Toribio Hummer, MD [facility])  HPI:  43 year old man with past medical history significant for hypertension, metastatic clear-cell renal cell carcinoma (mets to the lung, liver and peritoneum), PE on anticoagulation with Eliquis  and chronic kidney disease stage IIIb (baseline creatinine 1.3-1.6) status post right nephrectomy in June 2024.  He was admitted to the hospital yesterday for uncontrolled abdominal pain and had a CT scan of the abdomen and pelvis with IV contrast.  He is on cabozantinib  and belzutifan  for chemotherapy.  He reports poor oral intake prior to admission with an episode of nausea and vomiting prior to admission.  He denies any notable changes in urine output and specifically denies hematuria or flank pain.  He denies taking any NSAIDs.  Past Medical History:  Diagnosis Date   Acute prostatitis 06/19/2007   ALLERGIC RHINITIS 06/19/2007   ANXIETY 06/19/2007   Cancer (HCC)    kidney   ELEVATED BLOOD PRESSURE WITHOUT DIAGNOSIS OF HYPERTENSION 06/19/2007   GERD 06/19/2007   GERD (gastroesophageal reflux disease)    History of COVID-19 01/24/2019   History of kidney stones    Hypertension    Inguinal hernia    Migraines    NEPHROLITHIASIS, HX OF 06/19/2007   PE (pulmonary thromboembolism) (HCC)     Past Surgical History:  Procedure Laterality Date   CYSTOSCOPY WITH RETROGRADE PYELOGRAM, URETEROSCOPY AND STENT PLACEMENT Bilateral 02/09/2019   Procedure: CYSTOSCOPY WITH RETROGRADE PYELOGRAM, DIAGNOSTIC URETEROSCOPY AND STENT PLACEMENT;  Surgeon: Alvaro Hummer, MD;  Location: [facility];  Service: Urology;  Laterality: Bilateral;  75 MINS   CYSTOSCOPY WITH RETROGRADE PYELOGRAM, URETEROSCOPY AND STENT PLACEMENT Bilateral 02/23/2019   Procedure: CYSTOSCOPY WITH RETROGRADE PYELOGRAM, URETEROSCOPY AND STENT PLACEMENT;  Surgeon: Alvaro Hummer, MD;  Location: [facility];  Service: Urology;   Laterality: Bilateral;  1 HR   HERNIA REPAIR     HOLMIUM LASER APPLICATION Bilateral 02/23/2019   Procedure: HOLMIUM LASER APPLICATION;  Surgeon: Alvaro Hummer, MD;  Location: [facility];  Service: Urology;  Laterality: Bilateral;   ROBOT ASSISTED LAPAROSCOPIC NEPHRECTOMY Right 06/18/2022   Procedure: XI ROBOTIC ASSISTED LAPAROSCOPIC RADICAL NEPHRECTOMY AND PERICAVAL LYMPH NODE DISSECTION;  Surgeon: Alvaro Hummer KATHEE Mickey., MD;  Location: [facility];  Service: Urology;  Laterality: Right;  3 HRS    Family History  Problem Relation Age of Onset   Anxiety disorder Mother    Dementia Mother    Coronary artery disease Father    Anxiety disorder Brother    Diabetes Brother     Social History:  reports that he has quit smoking. He has quit using smokeless tobacco. He reports that he does not drink alcohol and does not use drugs.  Allergies:  Allergies  Allergen Reactions   Amlodipine  Other (See Comments)    Edema ( 5 mg)   Doxycycline  Other (See Comments)    Light headed , passed out    Medications: I have reviewed the patient's current medications. Scheduled:  apixaban   5 mg Oral BID   belzutifan   120 mg Oral Daily   cabozantinib   60 mg Oral Daily   metoprolol  succinate  50 mg Oral Daily   oxyCODONE   10 mg Oral Q12H   pantoprazole   40 mg Oral Q0600   senna-docusate  1 tablet Oral BID   sodium chloride  flush  3 mL Intravenous Q12H   Continuous:  sodium chloride  125 mL/hr at 09/29/23 9297      Latest Ref Rng &  Units 09/29/2023    6:25 AM 09/28/2023   10:32 AM 09/27/2023    8:55 AM  BMP  Glucose 70 - 99 mg/dL 878  853  878   BUN 6 - 20 mg/dL 33  26  21   Creatinine 0.61 - 1.24 mg/dL 6.13  7.35  8.15   Sodium 135 - 145 mmol/L 133  136  135   Potassium 3.5 - 5.1 mmol/L 4.5  4.8  4.2   Chloride 98 - 111 mmol/L 98  99  101   CO2 22 - 32 mmol/L 19  19  19    Calcium 8.9 - 10.3 mg/dL 8.8  9.1  9.2       Latest Ref Rng & Units 09/29/2023    6:25 AM 09/28/2023   10:32 AM 09/27/2023     8:55 AM  CBC  WBC 4.0 - 10.5 K/uL 10.3  9.0  5.8   Hemoglobin 13.0 - 17.0 g/dL 85.6  85.1  86.3   Hematocrit 39.0 - 52.0 % 47.4  47.0  44.4   Platelets 150 - 400 K/uL 342  408  319    Urinalysis    Component Value Date/Time   COLORURINE AMBER (A) 09/28/2023 1404   APPEARANCEUR HAZY (A) 09/28/2023 1404   LABSPEC 1.043 (H) 09/28/2023 1404   PHURINE 5.0 09/28/2023 1404   GLUCOSEU NEGATIVE 09/28/2023 1404   GLUCOSEU NEGATIVE 01/22/2022 1037   HGBUR NEGATIVE 09/28/2023 1404   BILIRUBINUR NEGATIVE 09/28/2023 1404   KETONESUR NEGATIVE 09/28/2023 1404   PROTEINUR 100 (A) 09/28/2023 1404   UROBILINOGEN 0.2 01/22/2022 1037   NITRITE NEGATIVE 09/28/2023 1404   LEUKOCYTESUR NEGATIVE 09/28/2023 1404   No results found.  Review of Systems  Constitutional:  Positive for activity change and fatigue. Negative for chills and unexpected weight change.  HENT:  Negative for nosebleeds, sore throat and trouble swallowing.   Eyes:  Negative for redness and visual disturbance.  Respiratory:  Negative for cough, chest tightness and shortness of breath.   Cardiovascular:  Negative for chest pain and leg swelling.  Gastrointestinal:  Positive for abdominal pain, nausea and vomiting. Negative for abdominal distention and diarrhea.  Genitourinary:  Negative for difficulty urinating, dysuria, flank pain and hematuria.  Musculoskeletal:  Positive for back pain. Negative for myalgias.  Skin:  Negative for rash and wound.  Neurological:  Positive for weakness. Negative for light-headedness and headaches.   Blood pressure 104/68, pulse 97, temperature 98 F (36.7 C), temperature source Oral, resp. rate 18, height 6' 4 (1.93 m), weight 112.1 kg, SpO2 96%. Physical Exam Vitals and nursing note reviewed.  Constitutional:      Appearance: Normal appearance. He is ill-appearing.  HENT:     Head: Normocephalic and atraumatic.     Right Ear: External ear normal.     Left Ear: External ear normal.     Nose:  Nose normal.     Mouth/Throat:     Mouth: Mucous membranes are dry.     Pharynx: Oropharynx is clear.  Eyes:     Extraocular Movements: Extraocular movements intact.     Conjunctiva/sclera: Conjunctivae normal.  Cardiovascular:     Rate and Rhythm: Normal rate and regular rhythm.     Pulses: Normal pulses.     Heart sounds: Normal heart sounds.  Pulmonary:     Effort: Pulmonary effort is normal.     Breath sounds: Normal breath sounds. No wheezing or rales.  Abdominal:     General: Bowel sounds are normal.  There is distension.     Tenderness: There is abdominal tenderness.  Musculoskeletal:     Cervical back: Normal range of motion and neck supple.     Right lower leg: Edema present.     Left lower leg: Edema present.     Comments: 1+ bilateral lower extremity edema  Skin:    General: Skin is warm and dry.  Neurological:     Mental Status: He is alert and oriented to person, place, and time.     Assessment/Plan: 1.  Acute kidney injury on chronic kidney disease stage IIIa: Appears to be from a combination of volume contraction and contrast-induced nephropathy as evidenced by significantly elevated urine specific gravity and low urine sodium.  Nonoliguric but appears that urine output has not been charted-Will place order for strict I's and O's.  He does not have any acute electrolyte abnormality, uremic type signs or symptoms or significant volume overload to consider need for renal replacement therapy (with the understanding that he is not a candidate for chronic dialysis based on advanced/metastatic malignancy at baseline).  Agree with supportive management with intravenous fluids-normal saline 125 cc an hour at this time and close lab monitoring. Avoid nephrotoxic medications including NSAIDs and iodinated intravenous contrast exposure unless the latter is absolutely indicated.  Preferred narcotic agents for pain control are hydromorphone , fentanyl , and methadone. Morphine  should not  be used. Avoid Baclofen and avoid oral sodium phosphate  and magnesium  citrate based laxatives / bowel preps. Continue strict Input and Output monitoring. Will monitor the patient closely with you and intervene or adjust therapy as indicated by changes in clinical status/labs.  2.  Hyponatremia: Secondary to acute kidney injury/impaired free water  handling along with known osmotic ADH release in the setting of pain.  Monitor with isotonic fluids. 3.  Metastatic renal cell carcinoma: Status post previous right nephrectomy and with metastatic disease to multiple organs including the peritoneum.  No evidence of obstruction seen on recent CT scan, may consider renal ultrasound if creatinine continues to worsen with IV fluids. 4.  Elevated transaminases: Secondary to metastatic liver disease, monitor trend. 5.  Anion gap metabolic acidosis: Secondary to acute kidney injury, monitor with supportive management/isotonic fluids.  Gordy MARLA Blanch 09/29/2023, 12:21 PM

## 2023-09-29 NOTE — Plan of Care (Signed)
  Problem: Pain Managment: Goal: General experience of comfort will improve and/or be controlled Outcome: Progressing   Problem: Safety: Goal: Ability to remain free from injury will improve Outcome: Progressing   Problem: Skin Integrity: Goal: Risk for impaired skin integrity will decrease Outcome: Progressing

## 2023-09-29 NOTE — Progress Notes (Signed)
 Marcus Burns   DOB:1980/05/19   FM#:[id number]      ASSESSMENT & PLAN:  Marcus Burns is a 43 year old male patient with oncologic history significant for metastatic RCC with peritoneal carcinomatosis, liver and lung mets.  Patient admitted on 09/28/2023 for uncontrolled abdominal pain.  Metastatic renal cell carcinoma, mets to liver Peritoneal carcinomatosis - Diagnosed 06/18/2022, clear-cell renal cell carcinoma - Status post right radical nephrectomy - Status post lenvatinib  and pembrolizumab . - Due to progression of liver mets Keytruda  stopped at most recent outpatient oncology visit on 09/22/2023.  Plan discussed with patient at that visit is to switch to cabozantinib  with belzutifan . - Imaging done 09/27/2023 shows extensive intrahepatic mets and worsening peritoneal mets. -Medical oncology/Dr. Tina following closely and will make further recommendations.  History of PE - On AC with Eliquis  - Continue bleeding precautions - No bleeding noted at this time  Renal dysfunction Transaminitis, worsening Hyperbilirubinemia - Likely due to malignancy - Monitor CMP    Code Status Full   Subjective:  Patient seen awake and alert laying in bed.  Reports ongoing right upper quadrant abdominal pain somewhat relieved by pain meds.  Discussed with nurse that he is taking pain meds every 3 hours.  No other acute complaints offered.  Objective:   Intake/Output Summary (Last 24 hours) at 09/29/2023 1106 Last data filed at 09/29/2023 0500 Gross per 24 hour  Intake 1400 ml  Output --  Net 1400 ml     PHYSICAL EXAMINATION: ECOG PERFORMANCE STATUS: 3 - Symptomatic, >50% confined to bed  Vitals:   09/28/23 2341 09/29/23 0403  BP: 106/71 104/68  Pulse: 98 97  Resp: 18 18  Temp: (!) 97.5 F (36.4 C) 98 F (36.7 C)  SpO2: 98% 96%   Filed Weights   09/28/23 1853  Weight: 247 lb 2.2 oz (112.1 kg)    GENERAL: alert, no distress and comfortable +ill-appearing SKIN: skin  color, texture, turgor are normal, no rashes or significant lesions EYES: normal, conjunctiva are pink and non-injected, sclera clear OROPHARYNX: no exudate, no erythema and lips, buccal mucosa, and tongue normal  NECK: supple, thyroid  normal size, non-tender, without nodularity LYMPH: no palpable lymphadenopathy in the cervical, axillary or inguinal LUNGS: clear to auscultation and percussion with normal breathing effort HEART: regular rate & rhythm and no murmurs and no lower extremity edema ABDOMEN: abdomen soft, non-tender and normal bowel sounds MUSCULOSKELETAL: no cyanosis of digits and no clubbing  PSYCH: alert & oriented x 3 with fluent speech NEURO: no focal motor/sensory deficits   All questions were answered. The patient knows to call the clinic with any problems, questions or concerns.   The total time spent in the appointment was 40 minutes encounter with patient including review of chart and various tests results, discussions about plan of care and coordination of care plan  Olam JINNY Brunner, NP 09/29/2023 11:06 AM    Labs Reviewed:  Lab Results  Component Value Date   WBC 10.3 09/29/2023   HGB 14.3 09/29/2023   HCT 47.4 09/29/2023   MCV 99.6 09/29/2023   PLT 342 09/29/2023   Recent Labs    09/27/23 0855 09/28/23 1032 09/29/23 0625  NA 135 136 133*  K 4.2 4.8 4.5  CL 101 99 98  CO2 19* 19* 19*  GLUCOSE 121* 146* 121*  BUN 21* 26* 33*  CREATININE 1.84* 2.64* 3.86*  CALCIUM 9.2 9.1 8.8*  GFRNONAA 46* 30* 19*  PROT 6.1* 6.3* 6.2*  ALBUMIN  3.2* 3.3* 3.3*  AST  178* 195* 334*  ALT 77* 95* 162*  ALKPHOS 181* 179* 169*  BILITOT 1.0 1.4* 1.4*    Studies Reviewed:  CT ABDOMEN PELVIS W CONTRAST Result Date: 09/27/2023 CLINICAL DATA:  Abdominal pain, acute, nonlocalized EXAM: CT ABDOMEN AND PELVIS WITH CONTRAST TECHNIQUE: Multidetector CT imaging of the abdomen and pelvis was performed using the standard protocol following bolus administration of intravenous  contrast. RADIATION DOSE REDUCTION: This exam was performed according to the departmental dose-optimization program which includes automated exposure control, adjustment of the mA and/or kV according to patient size and/or use of iterative reconstruction technique. CONTRAST:  80mL OMNIPAQUE  IOHEXOL  300 MG/ML  SOLN COMPARISON:  09/16/2023 FINDINGS: Lower chest: Trace right pleural effusion with right basilar atelectasis.Unchanged 2 mm left lower lobe nodule abutting the hemidiaphragm (axial 42). Hepatobiliary: Enlarged liver, which is full of innumerable hypodense masses, without significant interval change.For example, the largest mass in the central right hepatic lobe measures 5.7 x 8.4 cm (axial 38), previously measuring 5.2 x 6.6 cm.The gallbladder was not well visualized or evaluated. No intrahepatic or extrahepatic biliary ductal dilation. The portal veins are patent. Pancreas: No mass or main ductal dilation. No peripancreatic inflammation or fluid collection. Spleen: Normal size. No mass. Adrenals/Urinary Tract: No adrenal masses. The right kidney is surgically absent. No nephrolithiasis or hydronephrosis. The urinary bladder is completely decompressed. Stomach/Bowel: The stomach is decompressed without focal abnormality. No small bowel wall thickening or inflammation. No small bowel obstruction.Normal appendix. Vascular/Lymphatic: No aortic aneurysm. No intraabdominal or pelvic lymphadenopathy. Nearly occlusive thrombus again noted within the intrahepatic IVC (axial 45). Reproductive: No prostatomegaly.Small volume free fluid in the pelvis. Other: Peritoneal nodule along the ventral abdominal wall measures 2.2 x 4.1 cm (previously 1.7 x 3.3 cm), axial 64. multiple additional small areas of nodularity in the epigastric fat adjacent to the ventral abdominal wall (axial 55), more prominent than on the prior study. No pneumoperitoneum. Peritoneal thickening in the right paracolic gutter is also noted (axial 79).  Mixed gas and fluid collection in the hepatorenal fossa is otherwise unchanged. Musculoskeletal: No acute fracture or destructive lesion. Moderate anasarca. Intramuscular metastatic disease in the inferior right rectus sheath measuring 1.4 cm (axial 76). IMPRESSION: 1. No acute intra-abdominal or pelvic abnormality. 2. Extensive intrahepatic metastases again noted filling the entire hepatic parenchyma, with slight enlargement of the index mass in the central right hepatic lobe, measuring 5.7 x 8.4 cm (previously, 5.2 x 6.6 cm, axial 38). 3. Worsening of the peritoneal metastatic disease. For example, the peritoneal nodule subjacent to the midline ventral incision (axial 64), measures 2.2 x 4.1 cm (previously, 1.7 x 3.3 cm). Trace malignant ascites in the pelvis. 4. Intramuscular metastatic disease along the inferior right rectus sheath measuring 1.4 cm (axial 76). 5. Similar appearance of the nearly occlusive thrombus within the intrahepatic IVC. Electronically Signed   By: Rogelia Myers M.D.   On: 09/27/2023 12:17   CT CHEST ABDOMEN PELVIS W CONTRAST Result Date: 09/19/2023 CLINICAL DATA:  RCC with liver Mets, clear cell carcinoma of right kidney. * Tracking Code: BO * EXAM: CT CHEST, ABDOMEN, AND PELVIS WITH CONTRAST TECHNIQUE: Multidetector CT imaging of the chest, abdomen and pelvis was performed following the standard protocol during bolus administration of intravenous contrast. RADIATION DOSE REDUCTION: This exam was performed according to the departmental dose-optimization program which includes automated exposure control, adjustment of the mA and/or kV according to patient size and/or use of iterative reconstruction technique. CONTRAST:  OMNIPAQUE  IOHEXOL  300 MG/ML  SOLN COMPARISON:  Multiple priors including CT February 02, 2023 FINDINGS: CT CHEST FINDINGS Cardiovascular: Normal caliber thoracic aorta. Normal size heart. No significant pericardial effusion/thickening. Mediastinum/Nodes: No  suspicious thyroid  nodule. No pathologically enlarged mediastinal, hilar or axillary lymph nodes. Lungs/Pleura: Left lower lobe pulmonary nodule measures 1-2 mm on image 111/4 previously 2 mm. No new suspicious pulmonary nodules or masses. Right lower lobe atelectasis versus scarring. Musculoskeletal: No aggressive lytic or blastic lesion of bone. CT ABDOMEN PELVIS FINDINGS Hepatobiliary: Innumerable bilobar hepatic lesions are progressed from prior examination. For reference: -indexed lesion in the dome of the liver measures 5.2 x 2.9 cm on image 43/2 previously 2.3 x 2.2 cm. Fluid collection in Morison's pouch described on prior examination not confidently seen on today's study. Cholelithiasis.  No biliary ductal dilation. Pancreas: No pancreatic ductal dilation or evidence of acute inflammation. Spleen: No splenomegaly. Adrenals/Urinary Tract: Right adrenal gland and kidney are surgically absent Left adrenal gland and kidney are unremarkable. Urinary bladder is minimally distended. Stomach/Bowel: Stomach is unremarkable for degree of distension. No pathologic dilation of small or large bowel. No evidence of acute bowel inflammation. Vascular/Lymphatic: Mixed attenuating nodularity in the intrahepatic IVC on image 80/2 is similar prior suspicious for thrombus. No pathologically enlarged abdominal or pelvic lymph nodes identified. Reproductive: Prostate gland is visualized. Other: Overall decreased omental caking with trace abdominopelvic free fluid. Nodular focus in the anterior abdomen subjacent to the incision line measures 3.3 x 1.7 cm on image 87/2 previously 4.1 x 3.0 cm. Musculoskeletal: No aggressive lytic or blastic lesion of bone. IMPRESSION: 1. Innumerable bilobar hepatic lesions are progressed from prior examination, consistent with worsening hepatic metastatic disease. 2. Overall decreased omental caking with trace abdominopelvic free fluid. 3. Mixed attenuating nodularity in the intrahepatic IVC is  similar prior suspicious for thrombus. 4. Left lower lobe pulmonary nodule measures 1-2 mm previously 2 mm. No new suspicious pulmonary nodules or masses. 5. Cholelithiasis. Electronically Signed   By: Reyes Holder M.D.   On: 09/19/2023 16:33

## 2023-09-29 NOTE — Progress Notes (Signed)
 PROGRESS NOTE    Marcus Burns  FMW:[id number] DOB: 1980-03-17 DOA: 09/28/2023 PCP: Norleen Lynwood ORN, MD   Chief Complaint  Patient presents with   Pain    Brief Narrative:  Patient unfortunate 43 year old gentleman history of clear-cell renal cell carcinoma with metastatic disease to the lungs, liver, peritoneum, history of PE on chronic anticoagulation with Eliquis  presenting to the ED with uncontrolled diffuse abdominal pain.  Patient seen in the ED for similar complaints 1 day prior to admission and discharged home on oral pain regimen however pain and manage and presented back to the ED.  Patient also noted to be AKI on CKD.  Oncology and nephrology consulted.   Assessment & Plan:   Principal Problem:   Uncontrolled pain Active Problems:   ARF (acute renal failure)   Metastatic renal cell carcinoma (HCC)   Anxiety   GERD   Hypertension   Anemia   Clear cell carcinoma of kidney (HCC)   CKD (chronic kidney disease)   Sinus tachycardia  #1 uncontrolled pain secondary to metastatic renal cell carcinoma to the liver, lungs, peritoneum - Patient presented back to the ED within a 24-hour period, with uncontrolled abdominal pain secondary to metastatic renal cell carcinoma to the liver and lungs peritoneum. - CT abdomen and pelvis done 1 day prior to admission with extensive liver metastases, filling the entire hepatic parenchyma, slight enlargement of the index mass in the central right hepatic lobe measuring 5.7 x 8.4 cm.  Worsening of the peritoneal metastatic disease.  Trace malignant ascites in the pelvis.  Intramuscular metastatic disease along the inferior right rectus sheath measuring 1.4 cm.  Similar appearance of newly occlusive thrombus within the intrahepatic IVC - ED physician discussed with patient's primary oncologist Dr. Tina who recommended starting patient on belzutifan  120 mg daily (wife to bring medication in today 09/29/2023), cabozantinib  60 mg daily and pain  management. -Pain currently better controlled on current regimen. -Continue OxyContin  10 mg twice daily. -Continue oxycodone  5 to 10 mg p.o. every 4 hours as needed breakthrough pain. -IV Dilaudid  as needed severe pain. -Oncology consulted and are following.   2.  AKI on CKD stage IIIa/solitary kidney -Patient with creatinine of 2.64 on admission from 1.84 on 09/27/2023. - Baseline creatinine approximately 1.3-1.9. - Likely secondary to prerenal azotemia in the setting of ARB versus contrast-induced with recent CT abdomen and pelvis. -Patient with worsening renal function creatinine now at 3.86 (09/29/2023) with a BUN of 33. - Patient did endorse poor oral intake over the past 48 hours. - Urinalysis done, nitrite negative, leukocytes negative, increased specific gravity. - Urine sodium ordered noted at < 30, urine creatinine 328. -CT abdomen and pelvis done on 09/27/2023 with solitary kidney, negative for hydronephrosis. - Patient noted to have received 1 L bolus of LR on presentation to the ED. -Patient now normal saline 125 cc an hour. -Will give a 500 cc bolus of normal saline x 1. -Continue to hold ARB. -Due to worsening renal function will consult with nephrology for further evaluation and management.   3.  Transaminitis -Secondary to metastatic disease.   4.  Hypertension -Patient states has been taken off all his antihypertensive medications.   - BP somewhat soft, continue Toprol -XL as patient noted with a sinus tachycardia.   - Continue to hold ARB.    5.  History of pulmonary embolism - Continue Eliquis .   6.  Sinus tachycardia -Likely secondary to problem #1 as patient with uncontrolled abdominal pain. - Patient noted  to be on a beta-blocker prior to admission which has been resumed.   - Tachycardia improved.   - Continue IV fluids, pain management, supportive care.   7.  GERD - Continue PPI.   8.  Anxiety - Continue home regimen and Ativan .     DVT prophylaxis:  Eliquis  Code Status: Full Family Communication: Updated patient.  No family at bedside. Disposition: Likely home when clinically improved, improvement with renal function, pain controlled, cleared by oncology and nephrology.  Status is: Observation The patient remains OBS appropriate and will d/c before 2 midnights.   Consultants:  Oncology: Dr. Tina  Procedures:  None  Antimicrobials:  Anti-infectives (From admission, onward)    None         Subjective: Patient sitting up in bed.  States was able to tolerate breakfast this morning ate about 50% of his meal.  States pain is currently controlled on the current regimen.  No nausea or vomiting.  No chest pain.  No shortness of breath.  States he has been having good urine output.  Objective: Vitals:   09/28/23 1749 09/28/23 1853 09/28/23 2341 09/29/23 0403  BP: 116/74 127/88 106/71 104/68  Pulse: (!) 127 (!) 119 98 97  Resp:  17 18 18   Temp:  97.6 F (36.4 C) (!) 97.5 F (36.4 C) 98 F (36.7 C)  TempSrc:  Oral Oral Oral  SpO2:  94% 98% 96%  Weight:  112.1 kg    Height:  6' 4 (1.93 m)      Intake/Output Summary (Last 24 hours) at 09/29/2023 1135 Last data filed at 09/29/2023 0500 Gross per 24 hour  Intake 1400 ml  Output --  Net 1400 ml   Filed Weights   09/28/23 1853  Weight: 112.1 kg    Examination:  General exam: Appears calm and comfortable  Respiratory system: Clear to auscultation. Respiratory effort normal. Cardiovascular system: S1 & S2 heard, RRR. No JVD, murmurs, rubs, gallops or clicks. No pedal edema. Gastrointestinal system: Abdomen is mildly distended, soft, nontender to palpation.  Positive hepatomegaly.  Positive bowel sounds.  No rebound.  No guarding.  Central nervous system: Alert and oriented. No focal neurological deficits. Extremities: Symmetric 5 x 5 power. Skin: No rashes, lesions or ulcers Psychiatry: Judgement and insight appear normal. Mood & affect appropriate.     Data  Reviewed: I have personally reviewed following labs and imaging studies  CBC: Recent Labs  Lab 09/27/23 0855 09/28/23 1032 09/29/23 0625  WBC 5.8 9.0 10.3  NEUTROABS 4.1 7.2 8.2*  HGB 13.6 14.8 14.3  HCT 44.4 47.0 47.4  MCV 95.7 97.7 99.6  PLT 319 408* 342    Basic Metabolic Panel: Recent Labs  Lab 09/27/23 0855 09/28/23 1032 09/29/23 0625  NA 135 136 133*  K 4.2 4.8 4.5  CL 101 99 98  CO2 19* 19* 19*  GLUCOSE 121* 146* 121*  BUN 21* 26* 33*  CREATININE 1.84* 2.64* 3.86*  CALCIUM 9.2 9.1 8.8*  MG  --  1.7 1.9    GFR: Estimated Creatinine Clearance: 33.5 mL/min (A) (by C-G formula based on SCr of 3.86 mg/dL (H)).  Liver Function Tests: Recent Labs  Lab 09/27/23 0855 09/28/23 1032 09/29/23 0625  AST 178* 195* 334*  ALT 77* 95* 162*  ALKPHOS 181* 179* 169*  BILITOT 1.0 1.4* 1.4*  PROT 6.1* 6.3* 6.2*  ALBUMIN  3.2* 3.3* 3.3*    CBG: No results for input(s): GLUCAP in the last 168 hours.   No results found  for this or any previous visit (from the past 240 hours).       Radiology Studies: No results found.      Scheduled Meds:  apixaban   5 mg Oral BID   belzutifan   120 mg Oral Daily   cabozantinib   60 mg Oral Daily   metoprolol  succinate  50 mg Oral Daily   oxyCODONE   10 mg Oral Q12H   pantoprazole   40 mg Oral Q0600   senna-docusate  1 tablet Oral BID   sodium chloride  flush  3 mL Intravenous Q12H   Continuous Infusions:  sodium chloride  125 mL/hr at 09/29/23 0702     LOS: 0 days    Time spent: 40 minutes    Toribio Hummer, MD [facility]   To contact the attending provider between 7A-7P or the covering provider during after hours 7P-7A, please log into the web site [email or web address] and access using universal [place] password for that web site. If you do not have the password, please call the hospital operator.  09/29/2023, 11:35 AM

## 2023-09-30 ENCOUNTER — Inpatient Hospital Stay (HOSPITAL_COMMUNITY)

## 2023-09-30 DIAGNOSIS — N1831 Chronic kidney disease, stage 3a: Secondary | ICD-10-CM | POA: Diagnosis not present

## 2023-09-30 DIAGNOSIS — N179 Acute kidney failure, unspecified: Secondary | ICD-10-CM | POA: Diagnosis not present

## 2023-09-30 DIAGNOSIS — R52 Pain, unspecified: Secondary | ICD-10-CM | POA: Diagnosis not present

## 2023-09-30 DIAGNOSIS — C641 Malignant neoplasm of right kidney, except renal pelvis: Secondary | ICD-10-CM | POA: Diagnosis not present

## 2023-09-30 LAB — CBC WITH DIFFERENTIAL/PLATELET
Abs Immature Granulocytes: 0.04 K/uL (ref 0.00–0.07)
Basophils Absolute: 0 K/uL (ref 0.0–0.1)
Basophils Relative: 0 %
Eosinophils Absolute: 0.1 K/uL (ref 0.0–0.5)
Eosinophils Relative: 1 %
HCT: 42.4 % (ref 39.0–52.0)
Hemoglobin: 13.3 g/dL (ref 13.0–17.0)
Immature Granulocytes: 0 %
Lymphocytes Relative: 9 %
Lymphs Abs: 1 K/uL (ref 0.7–4.0)
MCH: 31.2 pg (ref 26.0–34.0)
MCHC: 31.4 g/dL (ref 30.0–36.0)
MCV: 99.5 fL (ref 80.0–100.0)
Monocytes Absolute: 1.2 K/uL — ABNORMAL HIGH (ref 0.1–1.0)
Monocytes Relative: 11 %
Neutro Abs: 8.6 K/uL — ABNORMAL HIGH (ref 1.7–7.7)
Neutrophils Relative %: 79 %
Platelets: 272 K/uL (ref 150–400)
RBC: 4.26 MIL/uL (ref 4.22–5.81)
RDW: 15.6 % — ABNORMAL HIGH (ref 11.5–15.5)
WBC: 10.9 K/uL — ABNORMAL HIGH (ref 4.0–10.5)
nRBC: 0 % (ref 0.0–0.2)

## 2023-09-30 LAB — COMPREHENSIVE METABOLIC PANEL WITH GFR
ALT: 253 U/L — ABNORMAL HIGH (ref 0–44)
AST: 663 U/L — ABNORMAL HIGH (ref 15–41)
Albumin: 2.8 g/dL — ABNORMAL LOW (ref 3.5–5.0)
Alkaline Phosphatase: 162 U/L — ABNORMAL HIGH (ref 38–126)
Anion gap: 13 (ref 5–15)
BUN: 44 mg/dL — ABNORMAL HIGH (ref 6–20)
CO2: 17 mmol/L — ABNORMAL LOW (ref 22–32)
Calcium: 8.3 mg/dL — ABNORMAL LOW (ref 8.9–10.3)
Chloride: 100 mmol/L (ref 98–111)
Creatinine, Ser: 4.89 mg/dL — ABNORMAL HIGH (ref 0.61–1.24)
GFR, Estimated: 14 mL/min — ABNORMAL LOW (ref >60)
Glucose, Bld: 92 mg/dL (ref 70–99)
Potassium: 5.5 mmol/L — ABNORMAL HIGH (ref 3.5–5.1)
Sodium: 130 mmol/L — ABNORMAL LOW (ref 135–145)
Total Bilirubin: 1.6 mg/dL — ABNORMAL HIGH (ref 0.0–1.2)
Total Protein: 5.8 g/dL — ABNORMAL LOW (ref 6.5–8.1)

## 2023-09-30 LAB — GLUCOSE, CAPILLARY: Glucose-Capillary: 97 mg/dL (ref 70–99)

## 2023-09-30 MED ORDER — ALBUMIN HUMAN 5 % IV SOLN
25.0000 g | Freq: Once | INTRAVENOUS | Status: AC
  Administered 2023-09-30: 25 g via INTRAVENOUS
  Filled 2023-09-30: qty 500

## 2023-09-30 MED ORDER — SODIUM ZIRCONIUM CYCLOSILICATE 10 G PO PACK
10.0000 g | PACK | Freq: Two times a day (BID) | ORAL | Status: AC
  Administered 2023-09-30 (×2): 10 g via ORAL
  Filled 2023-09-30 (×2): qty 1

## 2023-09-30 MED ORDER — FUROSEMIDE 10 MG/ML IJ SOLN
80.0000 mg | Freq: Once | INTRAMUSCULAR | Status: AC
  Administered 2023-09-30: 80 mg via INTRAVENOUS
  Filled 2023-09-30: qty 8

## 2023-09-30 MED ORDER — MIDODRINE HCL 5 MG PO TABS
5.0000 mg | ORAL_TABLET | ORAL | Status: AC
  Administered 2023-09-30: 5 mg via ORAL
  Filled 2023-09-30: qty 1

## 2023-09-30 MED ORDER — ALBUMIN HUMAN 25 % IV SOLN
25.0000 g | Freq: Once | INTRAVENOUS | Status: AC
  Administered 2023-09-30: 25 g via INTRAVENOUS
  Filled 2023-09-30: qty 100

## 2023-09-30 MED ORDER — SODIUM CHLORIDE 0.9 % IV BOLUS
500.0000 mL | Freq: Once | INTRAVENOUS | Status: AC
  Administered 2023-09-30: 500 mL via INTRAVENOUS

## 2023-09-30 NOTE — Progress Notes (Addendum)
 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  Patient seen in the ED for similar complaints 1 day prior to admission and discharged home on oral pain regimen however pain and manage and presented back to the ED.  Patient also noted to be AKI on CKD.  Oncology and nephrology consulted.   Assessment & Plan:   Principal Problem:   Uncontrolled pain Active Problems:   ARF (acute renal failure)   Metastatic renal cell carcinoma (HCC)   Anxiety   GERD   Hypertension   Anemia   Clear cell carcinoma of kidney (HCC)   CKD (chronic kidney disease)   Sinus tachycardia  #1 uncontrolled pain secondary to metastatic renal cell carcinoma to the liver, lungs, peritoneum - Patient presented back to the ED within a 24-hour period, with uncontrolled abdominal pain secondary to metastatic renal cell carcinoma to the liver and lungs peritoneum. - CT abdomen and pelvis done 1 day prior to admission with extensive liver metastases, filling the entire hepatic parenchyma, slight enlargement of the index mass in the central right hepatic lobe measuring 5.7 x 8.4 cm.  Worsening of the peritoneal metastatic disease.  Trace malignant ascites in the pelvis.  Intramuscular metastatic disease along the inferior right rectus sheath measuring 1.4 cm.  Similar appearance of newly occlusive thrombus within the intrahepatic IVC - ED physician discussed with patient's primary oncologist Dr. Tina who recommended starting patient on belzutifan  120 mg daily (wife to bring medication in today 09/29/2023), cabozantinib  60 mg daily and pain  management. - Pain controlled on current pain regimen. -Continue OxyContin  10 mg twice daily. -Continue oxycodone  5 to 10 mg p.o. every 4 hours as needed breakthrough pain. -IV Dilaudid  as needed severe pain. -Oncology consulted and following.   2.  AKI on CKD stage IIIa/solitary kidney -Patient with creatinine of 2.64 on admission from 1.84 on 09/27/2023. - Baseline creatinine approximately 1.3-1.9. - Likely secondary to prerenal azotemia in the setting of ARB and contrast-induced nephropathy, with recent CT abdomen and pelvis. -Patient with worsening renal function creatinine now at 4.89 with a BUN of 44. - Patient did endorse poor oral intake over the past 48 hours prior to admission. - Urinalysis done, nitrite negative, leukocytes negative, increased specific gravity. - Urine sodium ordered noted at < 30, urine creatinine 328. -CT abdomen and pelvis done on 09/27/2023 with solitary kidney, negative for hydronephrosis. - Patient noted to have received 1 L bolus of LR on presentation to the ED. -Patient on IV fluids with NS 125 cc an hour.   - Continue to hold ARB.   - Due to worsening renal function nephrology consulted and recommending discontinuation of IV fluids and patient being challenged with albumin /Lasix  to try to augment his urine output and limit risk of volume overload.   - Per nephrology patient not a dialysis candidate given metastatic RCC/underlying functional status.   - Nephrology following and I appreciate the input and recommendations.     3.  Transaminitis -Secondary to metastatic disease.   4.  Hypertension -Patient states has been taken off all his antihypertensive medications.   - BP somewhat soft, continue Toprol -XL  as patient noted with a sinus tachycardia.   - ARB on hold.     5.  History of pulmonary embolism - Eliquis .     6.  Sinus tachycardia -Likely secondary to problem #1 as patient with uncontrolled abdominal pain. - Patient noted to be on a  beta-blocker prior to admission which has been resumed.   - Tachycardia improved.   - Continue pain management supportive care.   - IV fluids discontinued.    7.  GERD - PPI.   8.  Anxiety - Continue home regimen Ativan .  9.  Hyperkalemia -Secondary to worsening renal function. -Patient given Lokelma . -Repeat labs in AM. -Per nephrology.     DVT prophylaxis: Eliquis  Code Status: Full Family Communication: Updated patient.  No family at bedside. Disposition: Likely home when clinically improved, improvement with renal function, pain controlled, cleared by oncology and nephrology.  Status is: Inpatient    Consultants:  Oncology: Dr. Tina Nephrology: Dr. Tobie 09/29/2023  Procedures:  None  Antimicrobials:  Anti-infectives (From admission, onward)    None         Subjective: Patient sleeping but arousable.  Denies any chest pain or shortness of breath.  Abdominal pain currently controlled.    Objective: Vitals:   09/30/23 0616 09/30/23 0746 09/30/23 1221 09/30/23 1644  BP: 99/61 (!) 96/55 96/62 (!) 94/54  Pulse:  (!) 105 (!) 107 (!) 101  Resp: 16 18 18 20   Temp: 98 F (36.7 C) 97.7 F (36.5 C) 98.5 F (36.9 C) 98.6 F (37 C)  TempSrc: Oral Oral Oral Oral  SpO2: 95% 94% 95% 93%  Weight:      Height:        Intake/Output Summary (Last 24 hours) at 09/30/2023 1749 Last data filed at 09/30/2023 1231 Gross per 24 hour  Intake 240 ml  Output --  Net 240 ml   Filed Weights   09/28/23 1853  Weight: 112.1 kg    Examination:  General exam: NAD. Respiratory system: CTAB anterior lung fields.  No wheezes, no crackles, no rhonchi.  Fair air movement.  Speaking in full sentences.  Cardiovascular system: Regular rate rhythm no murmurs rubs or gallops.  No JVD.  No lower extremity edema.  Gastrointestinal system: Abdomen is soft, mildly distended, no significant tenderness to palpation.  Hepatomegaly.  Positive bowel sounds.  No rebound.  No guarding.   Central nervous system: Alert and oriented. No focal neurological deficits. Extremities: Symmetric 5 x 5 power. Skin: No rashes, lesions or ulcers Psychiatry: Judgement and insight appear fair. Mood & affect appropriate.     Data Reviewed: I have personally reviewed following labs and imaging studies  CBC: Recent Labs  Lab 09/27/23 0855 09/28/23 1032 09/29/23 0625 09/30/23 0534  WBC 5.8 9.0 10.3 10.9*  NEUTROABS 4.1 7.2 8.2* 8.6*  HGB 13.6 14.8 14.3 13.3  HCT 44.4 47.0 47.4 42.4  MCV 95.7 97.7 99.6 99.5  PLT 319 408* 342 272    Basic Metabolic Panel: Recent Labs  Lab 09/27/23 0855 09/28/23 1032 09/29/23 0625 09/30/23 0534  NA 135 136 133* 130*  K 4.2 4.8 4.5 5.5*  CL 101 99 98 100  CO2 19* 19* 19* 17*  GLUCOSE 121* 146* 121* 92  BUN 21* 26* 33* 44*  CREATININE 1.84* 2.64* 3.86* 4.89*  CALCIUM 9.2 9.1 8.8* 8.3*  MG  --  1.7 1.9  --     GFR: Estimated Creatinine Clearance: 26.4 mL/min (A) (by C-G formula based on SCr of 4.89  mg/dL (H)).  Liver Function Tests: Recent Labs  Lab 09/27/23 0855 09/28/23 1032 09/29/23 0625 09/30/23 0534  AST 178* 195* 334* 663*  ALT 77* 95* 162* 253*  ALKPHOS 181* 179* 169* 162*  BILITOT 1.0 1.4* 1.4* 1.6*  PROT 6.1* 6.3* 6.2* 5.8*  ALBUMIN  3.2* 3.3* 3.3* 2.8*    CBG: No results for input(s): GLUCAP in the last 168 hours.   No results found for this or any previous visit (from the past 240 hours).       Radiology Studies: No results found.      Scheduled Meds:  apixaban   5 mg Oral BID   belzutifan   120 mg Oral Daily   cabozantinib   60 mg Oral Daily   metoprolol  succinate  50 mg Oral Daily   oxyCODONE   10 mg Oral Q12H   pantoprazole   40 mg Oral Q0600   polyethylene glycol  17 g Oral Daily   senna-docusate  1 tablet Oral BID   sodium chloride  flush  3 mL Intravenous Q12H   sodium zirconium cyclosilicate   10 g Oral BID   Continuous Infusions:     LOS: 1 day    Time spent: 40  minutes    Toribio Hummer, MD [facility]   To contact the attending provider between 7A-7P or the covering provider during after hours 7P-7A, please log into the web site [email or web address] and access using universal [place] password for that web site. If you do not have the password, please call the hospital operator.  09/30/2023, 5:49 PM

## 2023-09-30 NOTE — Progress Notes (Signed)
 Patient ID: Marcus Burns, male   DOB: 06-03-1980, 43 y.o.   MRN: [id number] [facility] Progress Note   Assessment/ Plan:   1.  Acute kidney injury on chronic kidney disease stage IIIa: Appears to be from a combination of volume contraction and contrast-induced nephropathy based on initial labs/sequence of events.  Unfortunately worsening renal function with suboptimal urine output overnight in spite of intravenous fluids.  Will stop IV fluids at this time and challenge with albumin /Lasix  to try and augment urine output and limit risk of volume overload.  He is not a candidate for dialysis given metastatic RCC/underlying functional status. 2.  Hyponatremia: Secondary to acute kidney injury and impaired free water  handling +/- nonosmotic ADH release with pain.  Monitor with diuresis. 3.  Hyperkalemia: Mild, treat with Lokelma  and attempt to augment diuresis. 4.  Metastatic renal cell carcinoma: Management per oncology.  Subjective:   Without acute events overnight, feeling fatigued with intermittent abdominal discomfort.   Objective:   BP (!) 96/55 (BP Location: Left Arm)   Pulse (!) 105   Temp 97.7 F (36.5 C) (Oral)   Resp 18   Ht 6' 4 (1.93 m)   Wt 112.1 kg   SpO2 94%   BMI 30.08 kg/m   Intake/Output Summary (Last 24 hours) at 09/30/2023 1213 Last data filed at 09/29/2023 1400 Gross per 24 hour  Intake --  Output 400 ml  Net -400 ml   Weight change:   Physical Exam: Gen: Appears uncomfortable resting in bed, wife at bedside CVS: Pulse regular tachycardia, S1 and S2 normal without murmur or gallop Resp: Diminished breath sounds over bases, poor inspiratory effort.  No rales/rhonchi Abd: Soft, obese/protuberant, mild tenderness over lower quadrants Ext: Asymmetric lower extremity edema, left leg greater than right leg  Imaging: No results found.  Labs: BMET Recent Labs  Lab 09/27/23 0855 09/28/23 1032 09/29/23 0625 09/30/23 0534  NA 135 136 133*  130*  K 4.2 4.8 4.5 5.5*  CL 101 99 98 100  CO2 19* 19* 19* 17*  GLUCOSE 121* 146* 121* 92  BUN 21* 26* 33* 44*  CREATININE 1.84* 2.64* 3.86* 4.89*  CALCIUM 9.2 9.1 8.8* 8.3*   CBC Recent Labs  Lab 09/27/23 0855 09/28/23 1032 09/29/23 0625 09/30/23 0534  WBC 5.8 9.0 10.3 10.9*  NEUTROABS 4.1 7.2 8.2* 8.6*  HGB 13.6 14.8 14.3 13.3  HCT 44.4 47.0 47.4 42.4  MCV 95.7 97.7 99.6 99.5  PLT 319 408* 342 272    Medications:     apixaban   5 mg Oral BID   belzutifan   120 mg Oral Daily   cabozantinib   60 mg Oral Daily   furosemide   80 mg Intravenous Once   metoprolol  succinate  50 mg Oral Daily   oxyCODONE   10 mg Oral Q12H   pantoprazole   40 mg Oral Q0600   polyethylene glycol  17 g Oral Daily   senna-docusate  1 tablet Oral BID   sodium chloride  flush  3 mL Intravenous Q12H   sodium zirconium cyclosilicate   10 g Oral BID    Gordy Blanch, MD 09/30/2023, 12:13 PM

## 2023-09-30 NOTE — Progress Notes (Signed)
   09/30/23 1600  TOC Brief Assessment  Insurance and Status Reviewed  Patient has primary care physician Yes  Home environment has been reviewed Norleen Lynwood ORN, MD  Prior level of function: Independent  Prior/Current Home Services No current home services  Social Drivers of Health Review SDOH reviewed no interventions necessary  Readmission risk has been reviewed Yes  Transition of care needs no transition of care needs at this time

## 2023-09-30 NOTE — Progress Notes (Addendum)
     Patient Name: Marcus Burns           DOB: 10-Jun-1980  MRN: [id number]      Admission Date: 09/28/2023  Attending Provider: Sebastian Toribio GAILS, MD  Primary Diagnosis: Uncontrolled pain   Level of care: Telemetry   OVERNIGHT EVENT   HPI/ Events of Note  Marcus Burns, 43 y.o. male, was admitted on 09/28/2023 for Uncontrolled pain.  Rapid response: Hypotensive, SBP 70s.   Although patient has a history of hypertension, BP has been borderline low throughout the day, SBP 90's.  Antihypertensive meds held.  He did receive Toprol -XL, OxyContin , Lasix , and Dilaudid  earlier today which could be contributing to current BP. Per nephrology recommendations, IV fluids held to limit volume overload and challenge with albumin  and Lasix  were given.  Bedside Assessment:  Patient is awake, A/O x4, with no associated distress.  Current BP 80/60s.  All other vital stable.  NSR on cardiac telemetry. Patient denies dizziness, lightheadedness, CP, palpitations,  SOB, N/V, abdominal discomfort.  No overt signs of bleeding.  Poor urine output.  Respiratory: Bilaterally clear, no wheezing, no crackles. Normal effort. No accessory muscle use.  Cardiovascular: Regular rate and rhythm. BLE edema +2.  Abdomen: Abdomen is soft and nontender.  Mild distention. Positive bowel sounds in all quadrants.    Plan: 500 cc NS bolus IV albumin  every 6 hours x2 Consider midodrine  if BP is not improving well with fluids Lactic acid, CBC Hold further pain meds, sedatives, diuretics if possible.   Addendum-  [id number]- RN reports pt is diaphoretic.   VS similar to prior, still receiving albumin . SBP 80's. A/O without complaints or changes.   At bedside, pt appears weaker and more fatigue than earlier tonight.  He denies lightheadedness, chest pain, shortness of breath, abdominal discomfort. Does not appear to be sweating.  Bilateral upper and lower extremities cold and dry, oral temperature normal.   CBG  noted to be in the 80's. Verbal order for oral intake/ juice given to RN.   Will also obtain EKG, bnp, troponin, and chemistry panel.  Prior potassium from 9/26 elevated.   No output reported for tonight. Obtaining bladder scan measurement.  Transferred to SDU for closer monitoring.  Continue holding antihypertensive meds, sedatives, narcotics, diuretics if possible.   Rashan Patient, DNP, ACNPC- AG [facility] [place]

## 2023-09-30 NOTE — Progress Notes (Signed)
 This RN notified, CN and RR to pt bedside d/t pt BP high 70's over 50's. Pt alert; however, mild AMS. Wife at bedside stating that pt has not voided for the last 24 hours. Pt denied pain of discomfort. CN, NP and RR at bedside to evaluate pt. Pt stable. (See new orders). RN will continue to monitor and report to NP with updates of pt's VS and progress. Pt resting in no apparent distress at this time.

## 2023-09-30 NOTE — Plan of Care (Signed)
   Problem: Coping: Goal: Level of anxiety will decrease Outcome: Progressing   Problem: Safety: Goal: Ability to remain free from injury will improve Outcome: Progressing   Problem: Skin Integrity: Goal: Risk for impaired skin integrity will decrease Outcome: Progressing

## 2023-10-01 ENCOUNTER — Other Ambulatory Visit: Payer: Self-pay

## 2023-10-01 ENCOUNTER — Inpatient Hospital Stay (HOSPITAL_COMMUNITY)

## 2023-10-01 DIAGNOSIS — J9601 Acute respiratory failure with hypoxia: Secondary | ICD-10-CM | POA: Diagnosis not present

## 2023-10-01 DIAGNOSIS — I959 Hypotension, unspecified: Secondary | ICD-10-CM | POA: Diagnosis not present

## 2023-10-01 DIAGNOSIS — Z7189 Other specified counseling: Secondary | ICD-10-CM | POA: Diagnosis not present

## 2023-10-01 DIAGNOSIS — R579 Shock, unspecified: Secondary | ICD-10-CM

## 2023-10-01 DIAGNOSIS — C786 Secondary malignant neoplasm of retroperitoneum and peritoneum: Secondary | ICD-10-CM

## 2023-10-01 DIAGNOSIS — C649 Malignant neoplasm of unspecified kidney, except renal pelvis: Secondary | ICD-10-CM | POA: Diagnosis not present

## 2023-10-01 DIAGNOSIS — N179 Acute kidney failure, unspecified: Secondary | ICD-10-CM | POA: Diagnosis not present

## 2023-10-01 DIAGNOSIS — C641 Malignant neoplasm of right kidney, except renal pelvis: Secondary | ICD-10-CM | POA: Diagnosis not present

## 2023-10-01 DIAGNOSIS — R52 Pain, unspecified: Secondary | ICD-10-CM

## 2023-10-01 DIAGNOSIS — R578 Other shock: Secondary | ICD-10-CM

## 2023-10-01 DIAGNOSIS — R7401 Elevation of levels of liver transaminase levels: Secondary | ICD-10-CM

## 2023-10-01 DIAGNOSIS — E875 Hyperkalemia: Secondary | ICD-10-CM

## 2023-10-01 DIAGNOSIS — G9341 Metabolic encephalopathy: Secondary | ICD-10-CM

## 2023-10-01 DIAGNOSIS — N1831 Chronic kidney disease, stage 3a: Secondary | ICD-10-CM | POA: Diagnosis not present

## 2023-10-01 DIAGNOSIS — C787 Secondary malignant neoplasm of liver and intrahepatic bile duct: Secondary | ICD-10-CM | POA: Diagnosis not present

## 2023-10-01 LAB — CBC
HCT: 38.2 % — ABNORMAL LOW (ref 39.0–52.0)
Hemoglobin: 12.2 g/dL — ABNORMAL LOW (ref 13.0–17.0)
MCH: 31.4 pg (ref 26.0–34.0)
MCHC: 31.9 g/dL (ref 30.0–36.0)
MCV: 98.2 fL (ref 80.0–100.0)
Platelets: 221 K/uL (ref 150–400)
RBC: 3.89 MIL/uL — ABNORMAL LOW (ref 4.22–5.81)
RDW: 15.6 % — ABNORMAL HIGH (ref 11.5–15.5)
WBC: 12.6 K/uL — ABNORMAL HIGH (ref 4.0–10.5)
nRBC: 0 % (ref 0.0–0.2)

## 2023-10-01 LAB — BASIC METABOLIC PANEL WITH GFR
Anion gap: 15 (ref 5–15)
BUN: 58 mg/dL — ABNORMAL HIGH (ref 6–20)
CO2: 15 mmol/L — ABNORMAL LOW (ref 22–32)
Calcium: 8.6 mg/dL — ABNORMAL LOW (ref 8.9–10.3)
Chloride: 97 mmol/L — ABNORMAL LOW (ref 98–111)
Creatinine, Ser: 6.24 mg/dL — ABNORMAL HIGH (ref 0.61–1.24)
GFR, Estimated: 11 mL/min — ABNORMAL LOW (ref >60)
Glucose, Bld: 143 mg/dL — ABNORMAL HIGH (ref 70–99)
Potassium: 5.3 mmol/L — ABNORMAL HIGH (ref 3.5–5.1)
Sodium: 127 mmol/L — ABNORMAL LOW (ref 135–145)

## 2023-10-01 LAB — MRSA NEXT GEN BY PCR, NASAL: MRSA by PCR Next Gen: NOT DETECTED

## 2023-10-01 LAB — PROTIME-INR
INR: 2.1 — ABNORMAL HIGH (ref 0.8–1.2)
Prothrombin Time: 25 s — ABNORMAL HIGH (ref 11.4–15.2)

## 2023-10-01 LAB — CBC WITH DIFFERENTIAL/PLATELET
Abs Immature Granulocytes: 0.08 K/uL — ABNORMAL HIGH (ref 0.00–0.07)
Basophils Absolute: 0 K/uL (ref 0.0–0.1)
Basophils Relative: 0 %
Eosinophils Absolute: 0 K/uL (ref 0.0–0.5)
Eosinophils Relative: 0 %
HCT: 39.1 % (ref 39.0–52.0)
Hemoglobin: 11.9 g/dL — ABNORMAL LOW (ref 13.0–17.0)
Immature Granulocytes: 1 %
Lymphocytes Relative: 5 %
Lymphs Abs: 0.5 K/uL — ABNORMAL LOW (ref 0.7–4.0)
MCH: 29.8 pg (ref 26.0–34.0)
MCHC: 30.4 g/dL (ref 30.0–36.0)
MCV: 97.8 fL (ref 80.0–100.0)
Monocytes Absolute: 1.1 K/uL — ABNORMAL HIGH (ref 0.1–1.0)
Monocytes Relative: 10 %
Neutro Abs: 9.1 K/uL — ABNORMAL HIGH (ref 1.7–7.7)
Neutrophils Relative %: 84 %
Platelets: 211 K/uL (ref 150–400)
RBC: 4 MIL/uL — ABNORMAL LOW (ref 4.22–5.81)
RDW: 15.6 % — ABNORMAL HIGH (ref 11.5–15.5)
WBC: 10.8 K/uL — ABNORMAL HIGH (ref 4.0–10.5)
nRBC: 0 % (ref 0.0–0.2)

## 2023-10-01 LAB — HEPATIC FUNCTION PANEL
ALT: 380 U/L — ABNORMAL HIGH (ref 0–44)
AST: 1042 U/L — ABNORMAL HIGH (ref 15–41)
Albumin: 3.7 g/dL (ref 3.5–5.0)
Alkaline Phosphatase: 183 U/L — ABNORMAL HIGH (ref 38–126)
Bilirubin, Direct: 1.2 mg/dL — ABNORMAL HIGH (ref 0.0–0.2)
Indirect Bilirubin: 0.6 mg/dL (ref 0.3–0.9)
Total Bilirubin: 1.8 mg/dL — ABNORMAL HIGH (ref 0.0–1.2)
Total Protein: 6.5 g/dL (ref 6.5–8.1)

## 2023-10-01 LAB — RENAL FUNCTION PANEL
Albumin: 3.6 g/dL (ref 3.5–5.0)
Anion gap: 15 (ref 5–15)
BUN: 48 mg/dL — ABNORMAL HIGH (ref 6–20)
CO2: 18 mmol/L — ABNORMAL LOW (ref 22–32)
Calcium: 8.6 mg/dL — ABNORMAL LOW (ref 8.9–10.3)
Chloride: 99 mmol/L (ref 98–111)
Creatinine, Ser: 6.14 mg/dL — ABNORMAL HIGH (ref 0.61–1.24)
GFR, Estimated: 11 mL/min — ABNORMAL LOW (ref >60)
Glucose, Bld: 91 mg/dL (ref 70–99)
Phosphorus: 8 mg/dL — ABNORMAL HIGH (ref 2.5–4.6)
Potassium: 5.5 mmol/L — ABNORMAL HIGH (ref 3.5–5.1)
Sodium: 132 mmol/L — ABNORMAL LOW (ref 135–145)

## 2023-10-01 LAB — PRO BRAIN NATRIURETIC PEPTIDE: Pro Brain Natriuretic Peptide: 659 pg/mL — ABNORMAL HIGH (ref <300.0)

## 2023-10-01 LAB — TROPONIN T, HIGH SENSITIVITY: Troponin T High Sensitivity: 54 ng/L — ABNORMAL HIGH (ref 0–19)

## 2023-10-01 LAB — GLUCOSE, CAPILLARY
Glucose-Capillary: 86 mg/dL (ref 70–99)
Glucose-Capillary: 143 mg/dL — ABNORMAL HIGH (ref 70–99)

## 2023-10-01 LAB — LACTIC ACID, PLASMA: Lactic Acid, Venous: 1.7 mmol/L (ref 0.5–1.9)

## 2023-10-01 LAB — AMMONIA: Ammonia: 26 umol/L (ref 9–35)

## 2023-10-01 MED ORDER — SODIUM CHLORIDE 0.9 % IV BOLUS
500.0000 mL | Freq: Once | INTRAVENOUS | Status: AC
  Administered 2023-10-01: 500 mL via INTRAVENOUS

## 2023-10-01 MED ORDER — NOREPINEPHRINE 4 MG/250ML-% IV SOLN
0.0000 ug/min | INTRAVENOUS | Status: DC
  Administered 2023-10-01 (×2): 26 ug/min via INTRAVENOUS
  Filled 2023-10-01 (×2): qty 250

## 2023-10-01 MED ORDER — LINEZOLID 600 MG/300ML IV SOLN
600.0000 mg | Freq: Two times a day (BID) | INTRAVENOUS | Status: DC
  Administered 2023-10-01 – 2023-10-04 (×7): 600 mg via INTRAVENOUS
  Filled 2023-10-01 (×7): qty 300

## 2023-10-01 MED ORDER — SODIUM CHLORIDE 0.9 % IV SOLN
25.0000 mg | Freq: Four times a day (QID) | INTRAVENOUS | Status: DC | PRN
  Administered 2023-10-04: 25 mg via INTRAVENOUS
  Filled 2023-10-01 (×2): qty 1

## 2023-10-01 MED ORDER — ALBUMIN HUMAN 5 % IV SOLN
25.0000 g | Freq: Once | INTRAVENOUS | Status: AC
  Administered 2023-10-01: 25 g via INTRAVENOUS
  Filled 2023-10-01: qty 500

## 2023-10-01 MED ORDER — SODIUM CHLORIDE 0.9 % IV SOLN
2.0000 g | INTRAVENOUS | Status: DC
  Administered 2023-10-01 – 2023-10-03 (×3): 2 g via INTRAVENOUS
  Filled 2023-10-01 (×3): qty 12.5

## 2023-10-01 MED ORDER — MIDODRINE HCL 5 MG PO TABS
5.0000 mg | ORAL_TABLET | ORAL | Status: AC
  Administered 2023-10-01: 5 mg via ORAL
  Filled 2023-10-01: qty 1

## 2023-10-01 MED ORDER — SODIUM ZIRCONIUM CYCLOSILICATE 10 G PO PACK: 10.0000 g | PACK | Freq: Every day | ORAL | Status: DC

## 2023-10-01 MED ORDER — NOREPINEPHRINE 16 MG/250ML-% IV SOLN
0.0000 ug/min | INTRAVENOUS | Status: DC
Start: 2023-10-01 — End: 2023-10-04
  Administered 2023-10-01: 26 ug/min via INTRAVENOUS
  Administered 2023-10-02: 12 ug/min via INTRAVENOUS
  Administered 2023-10-03: 3 ug/min via INTRAVENOUS
  Filled 2023-10-01 (×3): qty 250

## 2023-10-01 MED ORDER — SODIUM ZIRCONIUM CYCLOSILICATE 10 G PO PACK: 10.0000 g | PACK | Freq: Two times a day (BID) | ORAL | Status: DC

## 2023-10-01 MED ORDER — MIDODRINE HCL 5 MG PO TABS
5.0000 mg | ORAL_TABLET | Freq: Three times a day (TID) | ORAL | Status: DC
Start: 2023-10-01 — End: 2023-10-01
  Administered 2023-10-01: 5 mg via ORAL
  Filled 2023-10-01: qty 1

## 2023-10-01 MED ORDER — VASOPRESSIN 20 UNITS/100 ML INFUSION FOR SHOCK
0.0300 [IU]/min | INTRAVENOUS | Status: DC
  Administered 2023-10-01 – 2023-10-03 (×6): 0.03 [IU]/min via INTRAVENOUS
  Filled 2023-10-01 (×6): qty 100

## 2023-10-01 MED ORDER — CHLORHEXIDINE GLUCONATE CLOTH 2 % EX PADS
6.0000 | MEDICATED_PAD | Freq: Every day | CUTANEOUS | Status: DC
  Administered 2023-10-01 – 2023-10-04 (×3): 6 via TOPICAL

## 2023-10-01 MED ORDER — SODIUM CHLORIDE 0.9% FLUSH: 10.0000 mL | INTRAVENOUS | Status: DC | PRN

## 2023-10-01 MED ORDER — ALBUMIN HUMAN 25 % IV SOLN
25.0000 g | Freq: Four times a day (QID) | INTRAVENOUS | Status: AC
  Administered 2023-10-01: 25 g via INTRAVENOUS
  Administered 2023-10-01: 12.5 g via INTRAVENOUS
  Administered 2023-10-01: 25 g via INTRAVENOUS
  Administered 2023-10-01: 12.5 g via INTRAVENOUS
  Filled 2023-10-01 (×2): qty 100

## 2023-10-01 MED ORDER — SODIUM CHLORIDE 0.9 % IV SOLN
250.0000 mL | INTRAVENOUS | Status: AC
  Administered 2023-10-01: 250 mL via INTRAVENOUS

## 2023-10-01 MED ORDER — NOREPINEPHRINE 4 MG/250ML-% IV SOLN
0.0000 ug/min | INTRAVENOUS | Status: DC
  Administered 2023-10-01: 20 ug/min via INTRAVENOUS
  Administered 2023-10-01: 2 ug/min via INTRAVENOUS
  Filled 2023-10-01 (×2): qty 250

## 2023-10-01 MED ORDER — SODIUM POLYSTYRENE SULFONATE 15 GM/60ML CO SUSP
15.0000 g | Freq: Once | Status: AC
Start: 2023-10-01 — End: 2023-10-02
  Administered 2023-10-02: 15 g via RECTAL
  Filled 2023-10-01: qty 60

## 2023-10-01 MED ORDER — MIDODRINE HCL 5 MG PO TABS
10.0000 mg | ORAL_TABLET | Freq: Three times a day (TID) | ORAL | Status: DC
Start: 2023-10-01 — End: 2023-10-02

## 2023-10-01 MED ORDER — SODIUM CHLORIDE 0.9% FLUSH
10.0000 mL | Freq: Two times a day (BID) | INTRAVENOUS | Status: DC
  Administered 2023-10-01: 40 mL
  Administered 2023-10-01 – 2023-10-02 (×2): 10 mL
  Administered 2023-10-02: 30 mL
  Administered 2023-10-03: 20 mL
  Administered 2023-10-03: 30 mL
  Administered 2023-10-04: 10 mL
  Administered 2023-10-04: 20 mL

## 2023-10-01 NOTE — Assessment & Plan Note (Signed)
 With profound liver metastases causing endorgan damage Appreciate nephrology and IM management

## 2023-10-01 NOTE — Assessment & Plan Note (Signed)
 Profound liver metastases with recent progression Continue belzutifan  120 mg daily and cabozantinib  60 mg daily as able

## 2023-10-01 NOTE — IPAL (Signed)
  Interdisciplinary Goals of Care Family Meeting   Date carried out: 10/01/2023  Location of the meeting: Bedside  Member's involved: Physician, Bedside Registered Nurse, and Family Member or next of kin  Durable Power of Attorney or acting medical decision maker: Wife, Alberta Cairns  Discussion: We discussed goals of care for Marcus Burns .  Metastatic renal cell carcinoma presented with abdominal pain found to have worsening metastatic disease including peritoneal carcinomatosis, liver metastases.  During the course of this admission he developed progressive renal failure.  As a result, he has lost regulation of his blood pressure and has developed hypotension.  Now on 2 vasopressors.  Even preceding more significant hypotension, he was developing signs of elevated LFTs and now full-blown liver failure with elevated INR and encephalopathy.  Now obtunded.  Was speaking this morning.  In light of rapid deterioration of multisystem organ failure with underlying metastatic cancer, additional interventions are not going to improve his condition.  We have discussed DO NOT RESUSCITATE throughout the day.  This evening, wife indicated she did not want resuscitative efforts.  We discussed this in detail, and DNR order, and what that entails.  We discussed comfort measures.  If he were to continue to deteriorate I would recommend extending comfort measures overnight.  I fear we do not have much time with him, I think he will die soon, potentially even overnight even with chemical life support via vasopressors.  She expressed understanding.  We agreed to continue current care with a DNR.  If there is concern for discomfort or further decompensation overnight our medical team will reach out and discussed comfort measures at that time.  Code status:   Code Status: Limited: Do not attempt resuscitation (DNR) -DNR-LIMITED -Do Not Intubate/DNI    Disposition: Continue current acute care if worsens or so  signs of significant discomfort recommend transitioning goals and efforts towards comfort care  Time spent for the meeting: 10 minutes    Marcus JONELLE Beals, MD  10/01/2023, 6:26 PM

## 2023-10-01 NOTE — Assessment & Plan Note (Signed)
 Secondary to malignancy and medication

## 2023-10-01 NOTE — Progress Notes (Signed)
 Marcus Burns   DOB:03-21-1980   FM#:[id number]    ASSESSMENT & PLAN:  Marcus Burns. Kadlec is a 43 year old male patient with oncologic history significant for metastatic RCC with peritoneal carcinomatosis, liver and lung mets.  Patient admitted on 09/28/2023 for uncontrolled abdominal pain.   Clinically worsening overnight with hypotension, anuric and transferred to ICU.  Morning labs are pending.  Spoke to [place], and later [place].  Clinically, he seems to be deteriorating.  Profound liver metastases likely causing endorgan damage, including renal failure, on top of liver dysfunction.  Dr. Sebastian and Dr. Tobie has intervened with additional measures for his hypertension and renal failure.  We discussed that in the event of continuing deterioration, I recommend keeping him comfortable, DO NOT RESUSCITATE or intubate.  Discussed with profound liver metastases, he was unlikely to recover after chest compression or intubation.  I explained to parent difference between DNR and Full Code.  He expressed understanding and agrees with DNR.  I spoke to the wife, she would like to confirm with him later when she comes in. In the meantime please continue oncology medications, and management of any reversible causes. Assessment & Plan ARF (acute renal failure) With profound liver metastases causing endorgan damage Appreciate nephrology and IM management Hypotension Pressors and IVF per IM Metastatic renal cell carcinoma (HCC) Profound liver metastases with recent progression Continue belzutifan  120 mg daily and cabozantinib  60 mg daily as able Uncontrolled pain Pain management per IM Anemia Secondary to malignancy and medication CKD (chronic kidney disease) Underlying CKD with acute renal failure  Code Status Discussed and recommended DNR.  Pending wife verification  Goals of care Discussed today  All questions were answered.    The total time spent in the appointment was 60 minutes encounter  with patients including review of chart and various tests results, discussions about plan of care and coordination of care plan  Thank you for the consult. Will follow with you.  [name], MD 10/01/2023 10:09 AM  Subjective:  Marcus Burns was transferred to ICU this morning.  Report of hypotension overnight.  Report not making any urine.  He said he is able to eat and drink.  Abdominal pain on the right side.  Blood pressure was down to 76/56 this morning with tachycardia.  Objective:  Vitals:   10/01/23 0900 10/01/23 0908  BP: (!) 87/42   Pulse: 96 99  Resp: 10 11  Temp:    SpO2: 94% 96%     Intake/Output Summary (Last 24 hours) at 10/01/2023 1009 Last data filed at 10/01/2023 0442 Gross per 24 hour  Intake 386.58 ml  Output --  Net 386.58 ml    GENERAL: alert, no distress and comfortable SKIN: skin color normal LUNGS: normal breathing effort.  ABDOMEN: abdomen firm and over right upper quadrant, tender right upper quadrant Musculoskeletal: Bilateral ankle edema NEURO: alert oriented to place, needed to reinforce person and time  Labs:  Recent Labs    09/28/23 1032 09/29/23 0625 09/30/23 0534  NA 136 133* 130*  K 4.8 4.5 5.5*  CL 99 98 100  CO2 19* 19* 17*  GLUCOSE 146* 121* 92  BUN 26* 33* 44*  CREATININE 2.64* 3.86* 4.89*  CALCIUM 9.1 8.8* 8.3*  GFRNONAA 30* 19* 14*  PROT 6.3* 6.2* 5.8*  ALBUMIN  3.3* 3.3* 2.8*  AST 195* 334* 663*  ALT 95* 162* 253*  ALKPHOS 179* 169* 162*  BILITOT 1.4* 1.4* 1.6*    Studies:  US  EKG SITE RITE Result Date:  10/01/2023 If Site Rite image not attached, placement could not be confirmed due to current cardiac rhythm.  US  RENAL Result Date: 09/30/2023 CLINICAL DATA:  Acute kidney injury EXAM: RENAL / URINARY TRACT ULTRASOUND COMPLETE COMPARISON:  CT abdomen and pelvis 09/27/2023 FINDINGS: Right Kidney: Right kidney is surgically absent. No mass demonstrated in the visualized right renal fossa. Numerous cystic lesions are  demonstrated in the visualized liver. See prior CT report. Left Kidney: Renal measurements: 9.1 x 4.8 x 5.8 cm = volume: 132 mL. Diffusely increased parenchymal echotexture. Grossly normal parenchymal thickness. No hydronephrosis. Bladder: Bladder is not visualized due to overlying bowel gas. Other: Technically limited examination due to patient's body habitus and bowel gas. IMPRESSION: 1. Right kidney is surgically absent. Visualized left kidney demonstrates increased parenchymal echotexture suggesting chronic medical renal disease. No hydronephrosis. Electronically Signed   By: Elsie Gravely M.D.   On: 09/30/2023 20:17   CT ABDOMEN PELVIS W CONTRAST Result Date: 09/27/2023 CLINICAL DATA:  Abdominal pain, acute, nonlocalized EXAM: CT ABDOMEN AND PELVIS WITH CONTRAST TECHNIQUE: Multidetector CT imaging of the abdomen and pelvis was performed using the standard protocol following bolus administration of intravenous contrast. RADIATION DOSE REDUCTION: This exam was performed according to the departmental dose-optimization program which includes automated exposure control, adjustment of the mA and/or kV according to patient size and/or use of iterative reconstruction technique. CONTRAST:  80mL OMNIPAQUE  IOHEXOL  300 MG/ML  SOLN COMPARISON:  09/16/2023 FINDINGS: Lower chest: Trace right pleural effusion with right basilar atelectasis.Unchanged 2 mm left lower lobe nodule abutting the hemidiaphragm (axial 42). Hepatobiliary: Enlarged liver, which is full of innumerable hypodense masses, without significant interval change.For example, the largest mass in the central right hepatic lobe measures 5.7 x 8.4 cm (axial 38), previously measuring 5.2 x 6.6 cm.The gallbladder was not well visualized or evaluated. No intrahepatic or extrahepatic biliary ductal dilation. The portal veins are patent. Pancreas: No mass or main ductal dilation. No peripancreatic inflammation or fluid collection. Spleen: Normal size. No mass.  Adrenals/Urinary Tract: No adrenal masses. The right kidney is surgically absent. No nephrolithiasis or hydronephrosis. The urinary bladder is completely decompressed. Stomach/Bowel: The stomach is decompressed without focal abnormality. No small bowel wall thickening or inflammation. No small bowel obstruction.Normal appendix. Vascular/Lymphatic: No aortic aneurysm. No intraabdominal or pelvic lymphadenopathy. Nearly occlusive thrombus again noted within the intrahepatic IVC (axial 45). Reproductive: No prostatomegaly.Small volume free fluid in the pelvis. Other: Peritoneal nodule along the ventral abdominal wall measures 2.2 x 4.1 cm (previously 1.7 x 3.3 cm), axial 64. multiple additional small areas of nodularity in the epigastric fat adjacent to the ventral abdominal wall (axial 55), more prominent than on the prior study. No pneumoperitoneum. Peritoneal thickening in the right paracolic gutter is also noted (axial 79). Mixed gas and fluid collection in the hepatorenal fossa is otherwise unchanged. Musculoskeletal: No acute fracture or destructive lesion. Moderate anasarca. Intramuscular metastatic disease in the inferior right rectus sheath measuring 1.4 cm (axial 76). IMPRESSION: 1. No acute intra-abdominal or pelvic abnormality. 2. Extensive intrahepatic metastases again noted filling the entire hepatic parenchyma, with slight enlargement of the index mass in the central right hepatic lobe, measuring 5.7 x 8.4 cm (previously, 5.2 x 6.6 cm, axial 38). 3. Worsening of the peritoneal metastatic disease. For example, the peritoneal nodule subjacent to the midline ventral incision (axial 64), measures 2.2 x 4.1 cm (previously, 1.7 x 3.3 cm). Trace malignant ascites in the pelvis. 4. Intramuscular metastatic disease along the inferior right rectus sheath measuring 1.4 cm (  axial 76). 5. Similar appearance of the nearly occlusive thrombus within the intrahepatic IVC. Electronically Signed   By: Rogelia Myers M.D.    On: 09/27/2023 12:17   CT CHEST ABDOMEN PELVIS W CONTRAST Result Date: 09/19/2023 CLINICAL DATA:  RCC with liver Mets, clear cell carcinoma of right kidney. * Tracking Code: BO * EXAM: CT CHEST, ABDOMEN, AND PELVIS WITH CONTRAST TECHNIQUE: Multidetector CT imaging of the chest, abdomen and pelvis was performed following the standard protocol during bolus administration of intravenous contrast. RADIATION DOSE REDUCTION: This exam was performed according to the departmental dose-optimization program which includes automated exposure control, adjustment of the mA and/or kV according to patient size and/or use of iterative reconstruction technique. CONTRAST:  OMNIPAQUE  IOHEXOL  300 MG/ML  SOLN COMPARISON:  Multiple priors including CT February 02, 2023 FINDINGS: CT CHEST FINDINGS Cardiovascular: Normal caliber thoracic aorta. Normal size heart. No significant pericardial effusion/thickening. Mediastinum/Nodes: No suspicious thyroid  nodule. No pathologically enlarged mediastinal, hilar or axillary lymph nodes. Lungs/Pleura: Left lower lobe pulmonary nodule measures 1-2 mm on image 111/4 previously 2 mm. No new suspicious pulmonary nodules or masses. Right lower lobe atelectasis versus scarring. Musculoskeletal: No aggressive lytic or blastic lesion of bone. CT ABDOMEN PELVIS FINDINGS Hepatobiliary: Innumerable bilobar hepatic lesions are progressed from prior examination. For reference: -indexed lesion in the dome of the liver measures 5.2 x 2.9 cm on image 43/2 previously 2.3 x 2.2 cm. Fluid collection in Morison's pouch described on prior examination not confidently seen on today's study. Cholelithiasis.  No biliary ductal dilation. Pancreas: No pancreatic ductal dilation or evidence of acute inflammation. Spleen: No splenomegaly. Adrenals/Urinary Tract: Right adrenal gland and kidney are surgically absent Left adrenal gland and kidney are unremarkable. Urinary bladder is minimally distended. Stomach/Bowel:  Stomach is unremarkable for degree of distension. No pathologic dilation of small or large bowel. No evidence of acute bowel inflammation. Vascular/Lymphatic: Mixed attenuating nodularity in the intrahepatic IVC on image 80/2 is similar prior suspicious for thrombus. No pathologically enlarged abdominal or pelvic lymph nodes identified. Reproductive: Prostate gland is visualized. Other: Overall decreased omental caking with trace abdominopelvic free fluid. Nodular focus in the anterior abdomen subjacent to the incision line measures 3.3 x 1.7 cm on image 87/2 previously 4.1 x 3.0 cm. Musculoskeletal: No aggressive lytic or blastic lesion of bone. IMPRESSION: 1. Innumerable bilobar hepatic lesions are progressed from prior examination, consistent with worsening hepatic metastatic disease. 2. Overall decreased omental caking with trace abdominopelvic free fluid. 3. Mixed attenuating nodularity in the intrahepatic IVC is similar prior suspicious for thrombus. 4. Left lower lobe pulmonary nodule measures 1-2 mm previously 2 mm. No new suspicious pulmonary nodules or masses. 5. Cholelithiasis. Electronically Signed   By: Reyes Holder M.D.   On: 09/19/2023 16:33

## 2023-10-01 NOTE — Assessment & Plan Note (Signed)
 Pain management per IM

## 2023-10-01 NOTE — Progress Notes (Signed)
 Patient ID: Marcus Burns, male   DOB: 07/01/1980, 43 y.o.   MRN: [id number] [facility] Progress Note   Assessment/ Plan:   1.  Acute kidney injury on chronic kidney disease stage IIIa: Appears to be from a combination of volume contraction and contrast-induced nephropathy based on initial labs/sequence of events.  Very disappointing to see anuria/oliguria overnight with no documented urine output (corroborated by his wife).  Renal ultrasound negative for any obstruction of the solitary left kidney.  Awaiting labs from this morning to assess for any acute electrolyte abnormalities that we can intervene upon.  I discussed with his wife that he is a young man but with an unfortunate burden of illness with his metastatic renal cell cancer and I fear that renal replacement therapy is most likely going to worsen his quality of life.  There is no way to predict if any renal replacement will be long-term or short-term and we may need to look into comfort care as the better approach. 2.  Hyponatremia: Secondary to acute kidney injury and impaired free water  handling +/- nonosmotic ADH release with pain.  Unsuccessful diuretic challenge. 3.  Hyperkalemia: Treated yesterday with Lokelma  and attempted diuresis.  Labs awaited from this morning. 4.  Metastatic renal cell carcinoma: Management per oncology.  Subjective:   Increasing abdominal discomfort overnight with associated hypotension noted.  The latter was treated with albumin  boluses.  Poor/undocumented urine output noted.   Objective:   BP (!) 86/54 (BP Location: Right Arm)   Pulse 100   Temp 97.8 F (36.6 C) (Oral)   Resp 16   Ht 6' 4 (1.93 m)   Wt 112.1 kg   SpO2 99%   BMI 30.08 kg/m   Intake/Output Summary (Last 24 hours) at 10/01/2023 0805 Last data filed at 10/01/2023 0442 Gross per 24 hour  Intake 386.58 ml  Output --  Net 386.58 ml   Weight change:   Physical Exam: Gen: Sleeping comfortably in bed, wife at  bedside CVS: Pulse regular tachycardia, S1 and S2 normal without murmur or gallop Resp: Diminished breath sounds over bases, poor inspiratory effort.  No rales/rhonchi Abd: Soft, obese/protuberant, mild tenderness over lower quadrants Ext: Asymmetric lower extremity edema, left leg greater than right leg  Imaging: US  RENAL Result Date: 09/30/2023 CLINICAL DATA:  Acute kidney injury EXAM: RENAL / URINARY TRACT ULTRASOUND COMPLETE COMPARISON:  CT abdomen and pelvis 09/27/2023 FINDINGS: Right Kidney: Right kidney is surgically absent. No mass demonstrated in the visualized right renal fossa. Numerous cystic lesions are demonstrated in the visualized liver. See prior CT report. Left Kidney: Renal measurements: 9.1 x 4.8 x 5.8 cm = volume: 132 mL. Diffusely increased parenchymal echotexture. Grossly normal parenchymal thickness. No hydronephrosis. Bladder: Bladder is not visualized due to overlying bowel gas. Other: Technically limited examination due to patient's body habitus and bowel gas. IMPRESSION: 1. Right kidney is surgically absent. Visualized left kidney demonstrates increased parenchymal echotexture suggesting chronic medical renal disease. No hydronephrosis. Electronically Signed   By: Elsie Gravely M.D.   On: 09/30/2023 20:17    Labs: BMET Recent Labs  Lab 09/27/23 0855 09/28/23 1032 09/29/23 0625 09/30/23 0534  NA 135 136 133* 130*  K 4.2 4.8 4.5 5.5*  CL 101 99 98 100  CO2 19* 19* 19* 17*  GLUCOSE 121* 146* 121* 92  BUN 21* 26* 33* 44*  CREATININE 1.84* 2.64* 3.86* 4.89*  CALCIUM 9.2 9.1 8.8* 8.3*   CBC Recent Labs  Lab 09/27/23 0855 09/28/23 1032  09/29/23 0625 09/30/23 0534 10/01/23 0000  WBC 5.8 9.0 10.3 10.9* 12.6*  NEUTROABS 4.1 7.2 8.2* 8.6*  --   HGB 13.6 14.8 14.3 13.3 12.2*  HCT 44.4 47.0 47.4 42.4 38.2*  MCV 95.7 97.7 99.6 99.5 98.2  PLT 319 408* 342 272 221    Medications:     apixaban   5 mg Oral BID   belzutifan   120 mg Oral Daily   cabozantinib    60 mg Oral Daily   metoprolol  succinate  50 mg Oral Daily   midodrine   5 mg Oral TID WC   oxyCODONE   10 mg Oral Q12H   pantoprazole   40 mg Oral Q0600   polyethylene glycol  17 g Oral Daily   senna-docusate  1 tablet Oral BID   sodium chloride  flush  3 mL Intravenous Q12H    Gordy Blanch, MD 10/01/2023, 8:05 AM

## 2023-10-01 NOTE — Progress Notes (Signed)
  Rapid Response Event Note   Reason for Call : Low bp   Initial Focused Assessment: Pt easily aroused and able to f/c.  Denies pain.  Lungs clear and decreased.  Pt seems to be resting well.  BUE wnl and BLE edema 3+.  Bowel sounds intact.  (Family at bedside that reports pt has not had any u/o).  B/P 89/56 (66).    Interventions: TRIAD, NP at bedside new orders received and initiated.  See MAR and flow sheet for VS.   Plan of Care: Pt b/p is improving with interventions.  Pt will  remain in current location.  Primary RN to call if more assistance is needed.     Event Summary:   MD Notified: yes Call Time: 2140 Arrival Time: 2156 End Time: 2215  Prima Rayner Lavern, RN

## 2023-10-01 NOTE — Assessment & Plan Note (Signed)
 Pressors and IVF per IM

## 2023-10-01 NOTE — Consult Note (Signed)
 NAME:  Marcus Burns, MRN:  [id number], DOB:  1980/05/02, LOS: 2 ADMISSION DATE:  09/28/2023, CONSULTATION DATE:  10/01/23 REFERRING MD:  TRH, CHIEF COMPLAINT:  abdominal pain   History of Present Illness:  43 year old man widely metastatic renal cell carcinoma status post right nephrectomy presented with abdominal pain now transferred to the ICU with shock, liver failure, renal failure, encephalopathy, appears to be actively dying.  Admitted to the hospital 9/23 with abdominal pain.  CT abdomen pelvis did not reveal any surgical emergency.  Did demonstrate worsening peritoneal carcinomatosis, worsening liver metastasis, rectus sheath metastasis.  Likely contributing to pain.  Is placed on pain medications.  He is have progressive renal failure.  Developed hypotension and encephalopathy transferred to the ICU.  Documentation 9/27 via oncologist demonstrates he agreed to DNR.  Currently at the time of my assessment later in the day he is unable to participate in any conversation, does not have capacity.  There is no clear source of infection.  He is anuric.  INR elevated.  LFTs rising.  Creatinine rising.  Multisystem organ failure.  Appears to be actively dying on exam in my evaluation.  Pertinent  Medical History  As per EMR and discussion this note  Significant Hospital Events: Including procedures, antibiotic start and stop dates in addition to other pertinent events     Interim History / Subjective:    Objective    Blood pressure (!) 92/55, pulse 90, temperature 98.4 F (36.9 C), temperature source Oral, resp. rate (!) 8, height 6' 4 (1.93 m), weight 118.3 kg, SpO2 97%.        Intake/Output Summary (Last 24 hours) at 10/01/2023 1317 Last data filed at 10/01/2023 1223 Gross per 24 hour  Intake 628.41 ml  Output --  Net 628.41 ml   Filed Weights   09/28/23 1853 10/01/23 0908  Weight: 112.1 kg 118.3 kg    Examination: General: Chronically ill-appearing lying in bed HENT:  Atraumatic normocephalic Lungs: Mild tachypnea on nasal cannula with oxygen saturations high 90s Cardiovascular: Tachycardic Abdomen: Nondistended Extremities: Cool clammy Neuro: Does not arouse to sternal rub or voice, does not follow commands   Resolved problem list   Assessment and Plan   Metabolic encephalopathy: He is in multisystem organ failure, actively dying.  In the setting of widely metastatic cancer.  And renal failure. -- Stop opiate medications as this could be accumulating in the setting of his renal failure -- Avoid centrally acting medications for now  Shock: Hypotensive escalating vasopressor requirement.  I suspect this is in the setting of actively dying, multisystem organ failure.  No clear source of infection. -- Follow-up blood cultures, start cefepime and linezolid, de-escalate if cultures negative in 48 hours -- MAP greater than 65, norepinephrine, vasopressin ordered -- Chest x-ray ordered to evaluate for lung source  Acute hypoxemic respiratory failure: Likely related to hypoventilation in setting encephalopathy.  Possibly worsening/developing fluid overload with renal failure. -- Chest x-ray  Acute renal failure on CKD 3A: Possibly to contrast-induced nephropathy versus progressive renal failure and a send and widely metastatic renal cancer. -- Appreciate nephrology assistance, is not responding to Lasix , he is not a dialysis candidate -- Major contributor to active dying, nothing further can be done other than vasopressor support as above  Widely metastatic renal cell carcinoma with metastasis to liver lung and peritoneal carcinomatosis: He appears to be actively dying.  As such, aggressive interventions, resuscitative efforts are futile.  Oncologist documented a discussion with patient 9/27 where patient  agreed to DNR.  CODE STATUS was not changed.  Note indicates plan to discuss with the wife.  I recommended DNR to the wife and comfort measures would be  appropriate.  Patient no longer has capacity to make decisions.  Given patient has clearly documented or stated wishes, ideally CODE STATUS would be changed to DNR by provider who had that discussion.  Elevated LFTs and liver failure: Progressively worse in setting of hypotension.  With underlying progressive metastases, I suspect he is developing shock liver and in the process of likely converting to complete fulminant liver failure.  Encephalopathy, elevated INR, and elevated LFTs qualify for the diagnosis of liver failure -- Avoid hepatotoxic agents  Hyperkalemia: In the setting of renal failure.  Can try medication shifting etc. but this is futile and progressive renal failure not a candidate for dialysis. --Lokelma  per tube, NG ordered  Abdominal pain: Presenting complaint.  Related to worsening metastatic disease worsening peritoneal carcinomatosis as well as worsening hepatic metastases. -- Holding pain medications for now with encephalopathy and reported desire of aggressive care  Labs   CBC: Recent Labs  Lab 09/27/23 0855 09/28/23 1032 09/29/23 0625 09/30/23 0534 10/01/23 0000 10/01/23 1011  WBC 5.8 9.0 10.3 10.9* 12.6* 10.8*  NEUTROABS 4.1 7.2 8.2* 8.6*  --  9.1*  HGB 13.6 14.8 14.3 13.3 12.2* 11.9*  HCT 44.4 47.0 47.4 42.4 38.2* 39.1  MCV 95.7 97.7 99.6 99.5 98.2 97.8  PLT 319 408* 342 272 221 211    Basic Metabolic Panel: Recent Labs  Lab 09/27/23 0855 09/28/23 1032 09/29/23 0625 09/30/23 0534 10/01/23 1017  NA 135 136 133* 130* 132*  K 4.2 4.8 4.5 5.5* 5.5*  CL 101 99 98 100 99  CO2 19* 19* 19* 17* 18*  GLUCOSE 121* 146* 121* 92 91  BUN 21* 26* 33* 44* 48*  CREATININE 1.84* 2.64* 3.86* 4.89* 6.14*  CALCIUM 9.2 9.1 8.8* 8.3* 8.6*  MG  --  1.7 1.9  --   --   PHOS  --   --   --   --  8.0*   GFR: Estimated Creatinine Clearance: 21.6 mL/min (A) (by C-G formula based on SCr of 6.14 mg/dL (H)). Recent Labs  Lab 09/29/23 0625 09/30/23 0534 10/01/23 0000  10/01/23 1011  WBC 10.3 10.9* 12.6* 10.8*  LATICACIDVEN  --   --  1.7  --     Liver Function Tests: Recent Labs  Lab 09/27/23 0855 09/28/23 1032 09/29/23 0625 09/30/23 0534 10/01/23 1017  AST 178* 195* 334* 663*  --   ALT 77* 95* 162* 253*  --   ALKPHOS 181* 179* 169* 162*  --   BILITOT 1.0 1.4* 1.4* 1.6*  --   PROT 6.1* 6.3* 6.2* 5.8*  --   ALBUMIN  3.2* 3.3* 3.3* 2.8* 3.6   Recent Labs  Lab 09/27/23 0855  LIPASE 21   Recent Labs  Lab 10/01/23 1011  AMMONIA 26    ABG No results found for: PHART, PCO2ART, PO2ART, HCO3, TCO2, ACIDBASEDEF, O2SAT   Coagulation Profile: Recent Labs  Lab 10/01/23 1011  INR 2.1*    Cardiac Enzymes: No results for input(s): CKTOTAL, CKMB, CKMBINDEX, TROPONINI in the last 168 hours.  HbA1C: Hgb A1c MFr Bld  Date/Time Value Ref Range Status  01/22/2022 10:37 AM 6.4 4.6 - 6.5 % Final    Comment:    Glycemic Control Guidelines for People with Diabetes:Non Diabetic:  <6%Goal of Therapy: <7%Additional Action Suggested:  >8%   01/01/2021 10:18 AM  6.3 4.6 - 6.5 % Final    Comment:    Glycemic Control Guidelines for People with Diabetes:Non Diabetic:  <6%Goal of Therapy: <7%Additional Action Suggested:  >8%     CBG: Recent Labs  Lab 09/30/23 2207 10/01/23 0635  GLUCAP 97 86    Review of Systems:   Unable to obtain due to encephalopathy  Past Medical History:  He,  has a past medical history of Acute prostatitis (06/19/2007), ALLERGIC RHINITIS (06/19/2007), ANXIETY (06/19/2007), Cancer (HCC), ELEVATED BLOOD PRESSURE WITHOUT DIAGNOSIS OF HYPERTENSION (06/19/2007), GERD (06/19/2007), GERD (gastroesophageal reflux disease), History of COVID-19 (01/24/2019), History of kidney stones, Hypertension, Inguinal hernia, Migraines, NEPHROLITHIASIS, HX OF (06/19/2007), and PE (pulmonary thromboembolism) (HCC).   Surgical History:   Past Surgical History:  Procedure Laterality Date   CYSTOSCOPY WITH RETROGRADE  PYELOGRAM, URETEROSCOPY AND STENT PLACEMENT Bilateral 02/09/2019   Procedure: CYSTOSCOPY WITH RETROGRADE PYELOGRAM, DIAGNOSTIC URETEROSCOPY AND STENT PLACEMENT;  Surgeon: Alvaro Hummer, MD;  Location: [facility];  Service: Urology;  Laterality: Bilateral;  75 MINS   CYSTOSCOPY WITH RETROGRADE PYELOGRAM, URETEROSCOPY AND STENT PLACEMENT Bilateral 02/23/2019   Procedure: CYSTOSCOPY WITH RETROGRADE PYELOGRAM, URETEROSCOPY AND STENT PLACEMENT;  Surgeon: Alvaro Hummer, MD;  Location: [facility];  Service: Urology;  Laterality: Bilateral;  1 HR   HERNIA REPAIR     HOLMIUM LASER APPLICATION Bilateral 02/23/2019   Procedure: HOLMIUM LASER APPLICATION;  Surgeon: Alvaro Hummer, MD;  Location: [facility];  Service: Urology;  Laterality: Bilateral;   ROBOT ASSISTED LAPAROSCOPIC NEPHRECTOMY Right 06/18/2022   Procedure: XI ROBOTIC ASSISTED LAPAROSCOPIC RADICAL NEPHRECTOMY AND PERICAVAL LYMPH NODE DISSECTION;  Surgeon: Alvaro Hummer KATHEE Mickey., MD;  Location: [facility];  Service: Urology;  Laterality: Right;  3 HRS     Social History:   reports that he has quit smoking. He has quit using smokeless tobacco. He reports that he does not drink alcohol and does not use drugs.   Family History:  His family history includes Anxiety disorder in his brother and mother; Coronary artery disease in his father; Dementia in his mother; Diabetes in his brother.   Allergies Allergies  Allergen Reactions   Amlodipine  Other (See Comments)    Edema ( 5 mg)   Doxycycline  Other (See Comments)    Light headed , passed out     Home Medications  Prior to Admission medications   Medication Sig Start Date End Date Taking? Authorizing Provider  acetaminophen  (TYLENOL ) 500 MG tablet Take 1 tablet (500 mg total) by mouth every 6 (six) hours as needed for moderate pain (pain.). 10/14/22  Yes Briana Elgin LABOR, MD  apixaban  (ELIQUIS ) 5 MG TABS tablet Take 1 tablet (5 mg total) by mouth 2 (two) times daily. 05/18/23  Yes Iruku, Praveena, MD   cabozantinib  (CABOMETYX ) 60 MG tablet Take 1 tablet (60 mg total) by mouth daily. Take on an empty stomach, 1 hour before or 2 hours after meals. 09/22/23  Yes Tina Pauletta BROCKS, MD  loperamide (IMODIUM) 2 MG capsule Take by mouth as needed for diarrhea or loose stools.   Yes [provider]  LORazepam  (ATIVAN ) 0.5 MG tablet Take 0.5 mg by mouth at bedtime as needed for anxiety or sleep.   Yes [provider]  metoprolol  succinate (TOPROL -XL) 50 MG 24 hr tablet Take 1 tablet (50 mg total) by mouth daily. Take with or immediately following a meal. 12/24/22  Yes Tina Pauletta BROCKS, MD  oxyCODONE  (OXY IR/ROXICODONE ) 5 MG immediate release tablet Take 1 tablet (5 mg total) by mouth every  6 (six) hours as needed for severe pain (pain score 7-10). 09/27/23  Yes Tina Pauletta BROCKS, MD  telmisartan  (MICARDIS ) 40 MG tablet TAKE 1 TABLET BY MOUTH EVERY DAY 08/26/23  Yes Tina Pauletta BROCKS, MD  traZODone  (DESYREL ) 100 MG tablet Take 1 tablet (100 mg total) by mouth at bedtime as needed for sleep. 09/22/23  Yes Tina Pauletta BROCKS, MD  albuterol  (VENTOLIN  HFA) 108 (90 Base) MCG/ACT inhaler Inhale 2 puffs into the lungs every 6 (six) hours as needed for wheezing or shortness of breath. Patient not taking: Reported on 09/28/2023 12/24/21   Norleen Lynwood ORN, MD  belzutifan  (WELIREG ) 40 MG tablet Take 3 tablets (120 mg total) by mouth daily. 09/22/23   Tina Pauletta BROCKS, MD  cetirizine  (ZYRTEC ) 10 MG tablet Take 10 mg by mouth daily. Patient not taking: Reported on 09/28/2023    [provider]  docusate sodium  (COLACE) 100 MG capsule Take 1 capsule (100 mg total) by mouth 2 (two) times daily. Patient not taking: Reported on 09/28/2023 06/18/22   Cory Palma, PA-C  triamcinolone  (NASACORT ) 55 MCG/ACT AERO nasal inhaler Place 2 sprays into the nose daily. Patient not taking: Reported on 02/01/2019 05/13/17 02/01/19  Norleen Lynwood ORN, MD     Critical care time:     CRITICAL CARE Performed by: Donnice JONELLE Beals   Total critical care time: 45 minutes  Critical care time was exclusive of separately billable procedures and treating other patients.  Critical care was necessary to treat or prevent imminent or life-threatening deterioration.  Critical care was time spent personally by me on the following activities: development of treatment plan with patient and/or surrogate as well as nursing, discussions with consultants, evaluation of patient's response to treatment, examination of patient, obtaining history from patient or surrogate, ordering and performing treatments and interventions, ordering and review of laboratory studies, ordering and review of radiographic studies, pulse oximetry and re-evaluation of patient's condition.  Donnice JONELLE Beals, MD See [place]

## 2023-10-01 NOTE — Assessment & Plan Note (Signed)
 Underlying CKD with acute renal failure

## 2023-10-01 NOTE — Progress Notes (Addendum)
 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  Patient seen in the ED for similar complaints 1 day prior to admission and discharged home on oral pain regimen however pain and manage and presented back to the ED.  Patient also noted to be AKI on CKD.  Oncology and nephrology consulted.   Assessment & Plan:   Principal Problem:   Uncontrolled pain Active Problems:   ARF (acute renal failure)   Metastatic renal cell carcinoma (HCC)   Hypotension   Anxiety   GERD   Goals of care, counseling/discussion   Hypertension   Anemia   Clear cell carcinoma of kidney (HCC)   CKD (chronic kidney disease)   Sinus tachycardia  #1 uncontrolled pain secondary to metastatic renal cell carcinoma to the liver, lungs, peritoneum - Patient presented back to the ED within a 24-hour period, with uncontrolled abdominal pain secondary to metastatic renal cell carcinoma to the liver and lungs peritoneum. - CT abdomen and pelvis done 1 day prior to admission with extensive liver metastases, filling the entire hepatic parenchyma, slight enlargement of the index mass in the central right hepatic lobe measuring 5.7 x 8.4 cm.  Worsening of the peritoneal metastatic disease.  Trace malignant ascites in the pelvis.  Intramuscular metastatic disease along the inferior right rectus sheath measuring 1.4 cm.  Similar appearance of newly occlusive thrombus within the intrahepatic IVC - ED physician discussed with patient's primary oncologist Dr. Tina who recommended starting patient on belzutifan  120 mg daily (wife to bring medication on  09/29/2023), cabozantinib  60 mg daily and pain management. -LFTs trending up. - Pain controlled on current pain regimen. -Continue OxyContin  10 mg twice daily. -Continue oxycodone  5 to 10 mg p.o. every 4 hours as needed breakthrough pain. -IV Dilaudid  as needed severe pain. -Oncology consulted and following.   2.  AKI on CKD stage IIIa/solitary kidney -Patient with creatinine of 2.64 on admission from 1.84 on 09/27/2023. - Baseline creatinine approximately 1.3-1.9. - Likely secondary to prerenal azotemia in the setting of ARB and contrast-induced nephropathy, with recent CT abdomen and pelvis. -Patient with worsening renal function creatinine noted at 4.89 (09/30/2023) with a BUN of 44. -Patient noted to be anuric/oliguric.  Overnight with no urine output recorded. -Patient also noted overnight to have bouts of hypotension and transferred to the stepdown unit. -A.m. labs pending. - Patient did endorse poor oral intake over the past 48 hours prior to admission. - Urinalysis done, nitrite negative, leukocytes negative, increased specific gravity. - Urine sodium ordered noted at < 30, urine creatinine 328. -CT abdomen and pelvis done on 09/27/2023 with solitary kidney, negative for hydronephrosis. -Renal ultrasound done negative for hydronephrosis in solitary kidney. - Patient noted to have received 1 L bolus of LR on presentation to the ED. -Patient was on IV fluids which were subsequently discontinued per nephrology on 09/30/2023.   - Continue to hold ARB.   - Due to worsening renal function nephrology consulted and recommending discontinuation of IV fluids and patient being challenged with albumin /Lasix  to try to augment his urine output and limit risk of volume overload on 09/30/2023. - Patient noted to be  hypotensive overnight and Lasix  discontinued. -IV albumin  every 6 hours x 1 day due to hypotension. - Per nephrology patient not a dialysis candidate given metastatic RCC/underlying  functional status.   - Nephrology following and I appreciate the input and recommendations.  3.  Hypotension -Patient noted to be hypotensive overnight. -No urine output recorded overnight. -Patient noted with systolic blood pressures in the 70s received IV albumin  and started on midodrine  with some improvement with blood pressure. -Lactic acid level noted to be normal. -CBC pending this morning, troponin pending, BNP pending, renal panel pending, ammonia levels pending, INR pending. -Check a hepatic panel, blood cultures x 2. -Place on IV albumin  every 6 hours x 1 day. -Continue midodrine . -If no improvement with hypotension will start patient on pressors and consult with PCCM.  Addendum: Due to persistent hypotension patient started on Levophed drip. - PICC line ordered and placed. -PCCM consulted.   4.  Transaminitis -Secondary to metastatic disease. -LFTs noted to be worsening. -A.m. labs pending.   5.  Hypertension -Patient states has been taken off all his antihypertensive medications.   - BP somewhat soft initially earlier on in the hospitalization, continue Toprol -XL as patient noted with a sinus tachycardia.   - ARB on hold.   -Patient noted hypotension overnight and this morning.   6.  History of pulmonary embolism - Continue Eliquis .     7.  Sinus tachycardia -Likely secondary to problem #1 as patient with uncontrolled abdominal pain. - Patient noted to be on a beta-blocker prior to admission which has been resumed.   - Tachycardia improved.   - Continue pain management supportive care.   - IV fluids discontinued.    8.  GERD - Continue PPI.    9.  Anxiety - Continue home regimen Ativan .  10.  Hyperkalemia -Secondary to worsening renal function. - Potassium noted at 5.5 on 09/30/2023.   - Status post Lokelma .   - A.m. labs pending.   - Per nephrology.       DVT prophylaxis: Eliquis  Code Status: Full Family Communication: Updated patient.  No family  at bedside. Disposition: Patient transferred to stepdown unit.  Likely home when clinically improved, improvement with renal function, pain controlled, cleared by oncology and nephrology.  Status is: Inpatient    Consultants:  Oncology: Dr. Tina Nephrology: Dr. Tobie 09/29/2023  Procedures:  Renal ultrasound 09/30/2023   Antimicrobials:  Anti-infectives (From admission, onward)    None         Subjective: Events overnight noted.  Patient with bouts of hypotension, some diaphoresis.  Patient transferred to the stepdown unit.  Patient alert oriented to self place and time.  Occasionally zones out.  Patient denies any shortness of breath, no chest pain.  No significant worsening abdominal pain.  No urine output recorded overnight.  Objective: Vitals:   10/01/23 0636 10/01/23 0840 10/01/23 0900 10/01/23 0908  BP: (!) 86/54  (!) 87/42   Pulse: 100  96 99  Resp:   10 11  Temp:  98.4 F (36.9 C)    TempSrc:  Oral    SpO2: 99%  94% 96%  Weight:    118.3 kg  Height:        Intake/Output Summary (Last 24 hours) at 10/01/2023 1025 Last data filed at 10/01/2023 0442 Gross per 24 hour  Intake 386.58 ml  Output --  Net 386.58 ml   Filed Weights   09/28/23 1853 10/01/23 0908  Weight: 112.1 kg 118.3 kg    Examination:  General exam: NAD. Respiratory system: CTAB anterior lung fields.  No wheezes, no crackles, no rhonchi.  Fair air movement.  Speaking in full sentences.  Cardiovascular system: RRR no murmurs rubs or gallops.  No JVD.  No pitting lower extremity edema.  Gastrointestinal system: Abdomen is soft, mildly distended, no significant diffuse tenderness to palpation.  Hepatomegaly.  Positive bowel sounds.  No rebound.  No guarding.  Central nervous system: Alert and oriented.  Moving extremities spontaneously.  No focal neurological deficits. Extremities: Symmetric 5 x 5 power. Skin: No rashes, lesions or ulcers Psychiatry: Judgement and insight appear normal. Mood &  affect appropriate.     Data Reviewed: I have personally reviewed following labs and imaging studies  CBC: Recent Labs  Lab 09/27/23 0855 09/28/23 1032 09/29/23 0625 09/30/23 0534 10/01/23 0000  WBC 5.8 9.0 10.3 10.9* 12.6*  NEUTROABS 4.1 7.2 8.2* 8.6*  --   HGB 13.6 14.8 14.3 13.3 12.2*  HCT 44.4 47.0 47.4 42.4 38.2*  MCV 95.7 97.7 99.6 99.5 98.2  PLT 319 408* 342 272 221    Basic Metabolic Panel: Recent Labs  Lab 09/27/23 0855 09/28/23 1032 09/29/23 0625 09/30/23 0534  NA 135 136 133* 130*  K 4.2 4.8 4.5 5.5*  CL 101 99 98 100  CO2 19* 19* 19* 17*  GLUCOSE 121* 146* 121* 92  BUN 21* 26* 33* 44*  CREATININE 1.84* 2.64* 3.86* 4.89*  CALCIUM 9.2 9.1 8.8* 8.3*  MG  --  1.7 1.9  --     GFR: Estimated Creatinine Clearance: 27.1 mL/min (A) (by C-G formula based on SCr of 4.89 mg/dL (H)).  Liver Function Tests: Recent Labs  Lab 09/27/23 0855 09/28/23 1032 09/29/23 0625 09/30/23 0534  AST 178* 195* 334* 663*  ALT 77* 95* 162* 253*  ALKPHOS 181* 179* 169* 162*  BILITOT 1.0 1.4* 1.4* 1.6*  PROT 6.1* 6.3* 6.2* 5.8*  ALBUMIN  3.2* 3.3* 3.3* 2.8*    CBG: Recent Labs  Lab 09/30/23 2207 10/01/23 0635  GLUCAP 97 86     No results found for this or any previous visit (from the past 240 hours).       Radiology Studies: US  EKG SITE RITE Result Date: 10/01/2023 If Site Rite image not attached, placement could not be confirmed due to current cardiac rhythm.  US  RENAL Result Date: 09/30/2023 CLINICAL DATA:  Acute kidney injury EXAM: RENAL / URINARY TRACT ULTRASOUND COMPLETE COMPARISON:  CT abdomen and pelvis 09/27/2023 FINDINGS: Right Kidney: Right kidney is surgically absent. No mass demonstrated in the visualized right renal fossa. Numerous cystic lesions are demonstrated in the visualized liver. See prior CT report. Left Kidney: Renal measurements: 9.1 x 4.8 x 5.8 cm = volume: 132 mL. Diffusely increased parenchymal echotexture. Grossly normal parenchymal  thickness. No hydronephrosis. Bladder: Bladder is not visualized due to overlying bowel gas. Other: Technically limited examination due to patient's body habitus and bowel gas. IMPRESSION: 1. Right kidney is surgically absent. Visualized left kidney demonstrates increased parenchymal echotexture suggesting chronic medical renal disease. No hydronephrosis. Electronically Signed   By: Elsie Gravely M.D.   On: 09/30/2023 20:17        Scheduled Meds:  apixaban   5 mg Oral BID   belzutifan   120 mg Oral Daily   cabozantinib   60 mg Oral Daily   Chlorhexidine  Gluconate Cloth  6 each Topical Daily   metoprolol  succinate  50 mg Oral Daily   midodrine   5 mg Oral TID WC   oxyCODONE   10 mg  Oral Q12H   pantoprazole   40 mg Oral Q0600   polyethylene glycol  17 g Oral Daily   senna-docusate  1 tablet Oral BID   sodium chloride  flush  3 mL Intravenous Q12H   Continuous Infusions:  albumin  human     chlorproMAZINE (THORAZINE) 25 mg in sodium chloride  0.9 % 25 mL IVPB        LOS: 2 days    Time spent: 40 minutes  CRITICAL CARE Performed by: Toribio Hummer   Total critical care time: 40 minutes  Critical care time was exclusive of separately billable procedures and treating other patients.  Critical care was necessary to treat or prevent imminent or life-threatening deterioration.  Critical care was time spent personally by me on the following activities: development of treatment plan with patient and/or surrogate as well as nursing, discussions with consultants, evaluation of patient's response to treatment, examination of patient, obtaining history from patient or surrogate, ordering and performing treatments and interventions, ordering and review of laboratory studies, ordering and review of radiographic studies, pulse oximetry and re-evaluation of patient's condition.     Toribio Hummer, MD [facility]   To contact the attending provider between 7A-7P or the covering  provider during after hours 7P-7A, please log into the web site [email or web address] and access using universal [place] password for that web site. If you do not have the password, please call the hospital operator.  10/01/2023, 10:25 AM

## 2023-10-01 NOTE — Progress Notes (Signed)
 Patient being transferred to higher level of care at this time. ICU stepdown, room 1225. Report called to Nurse Sharyne RN.

## 2023-10-01 NOTE — Progress Notes (Signed)
 Peripherally Inserted Central Catheter Placement  The IV Nurse has discussed with the patient and/or persons authorized to consent for the patient, the purpose of this procedure and the potential benefits and risks involved with this procedure.  The benefits include less needle sticks, lab draws from the catheter, and the patient may be discharged home with the catheter. Risks include, but not limited to, infection, bleeding, blood clot (thrombus formation), and puncture of an artery; nerve damage and irregular heartbeat and possibility to perform a PICC exchange if needed/ordered by physician.  Alternatives to this procedure were also discussed.  Bard Power PICC patient education guide, fact sheet on infection prevention and patient information card has been provided to patient /or left at bedside. Obtained consent from wife at the bedside.  PICC Placement Documentation  PICC Triple Lumen 10/01/23 Right Basilic (Active)     PICC Triple Lumen 10/01/23 Right Basilic 37 cm 0 cm (Active)  Indication for Insertion or Continuance of Line Vasoactive infusions 10/01/23 1421  Exposed Catheter (cm) 0 cm 10/01/23 1421  Site Assessment Clean, Dry, Intact 10/01/23 1421  Lumen #1 Status Flushed;Saline locked;Blood return noted 10/01/23 1421  Lumen #2 Status Flushed;Saline locked;Blood return noted 10/01/23 1421  Lumen #3 Status Flushed;Saline locked;Blood return noted 10/01/23 1421  Dressing Type Transparent;Securing device 10/01/23 1421  Dressing Status Antimicrobial disc/dressing in place;Clean, Dry, Intact 10/01/23 1421  Line Care Connections checked and tightened 10/01/23 1421  Line Adjustment (NICU/IV Team Only) No 10/01/23 1421  Dressing Intervention New dressing;Adhesive placed at insertion site (IV team only) 10/01/23 1421  Dressing Change Due 10/08/23 10/01/23 1421       Marcus Burns Sheral Ruth 10/01/2023, 2:22 PM

## 2023-10-01 NOTE — Progress Notes (Signed)
 eLink Physician-Brief Progress Note Patient Name: Marcus Burns DOB: 10-16-1980 MRN: [id number]   Date of Service  10/01/2023  HPI/Events of Note  K+ = 5.3   Patient has Lokelma  ordered but unable to take PO at this time. RN asking if we wanted any other interventions to treat hyperkalemia?   Cr > 6  eICU Interventions  As per elink to do list , no escalation of care . DNR/DNI. Metastatic renal cell carcinoma.   - lokelma  rectal, 15 gm ordered.       Intervention Category Intermediate Interventions: Electrolyte abnormality - evaluation and management  [name] 10/01/2023, 10:39 PM

## 2023-10-01 NOTE — Progress Notes (Signed)
 There was order for placing PICC. Patient's GRF was 11 with consulting to nephrology. Secure chatted with Dr. Tobie ( nephrologist) regarding this matter. Dr. Tobie was okay to put in the PICC line. HS [employer]

## 2023-10-02 DIAGNOSIS — K7689 Other specified diseases of liver: Secondary | ICD-10-CM

## 2023-10-02 DIAGNOSIS — N179 Acute kidney failure, unspecified: Secondary | ICD-10-CM | POA: Diagnosis not present

## 2023-10-02 DIAGNOSIS — G9341 Metabolic encephalopathy: Secondary | ICD-10-CM | POA: Diagnosis not present

## 2023-10-02 DIAGNOSIS — J9601 Acute respiratory failure with hypoxia: Secondary | ICD-10-CM | POA: Diagnosis not present

## 2023-10-02 DIAGNOSIS — R579 Shock, unspecified: Secondary | ICD-10-CM | POA: Diagnosis not present

## 2023-10-02 DIAGNOSIS — C649 Malignant neoplasm of unspecified kidney, except renal pelvis: Secondary | ICD-10-CM | POA: Diagnosis not present

## 2023-10-02 LAB — CBC WITH DIFFERENTIAL/PLATELET
Abs Immature Granulocytes: 0.05 K/uL (ref 0.00–0.07)
Basophils Absolute: 0 K/uL (ref 0.0–0.1)
Basophils Relative: 0 %
Eosinophils Absolute: 0 K/uL (ref 0.0–0.5)
Eosinophils Relative: 0 %
HCT: 34.8 % — ABNORMAL LOW (ref 39.0–52.0)
Hemoglobin: 11.1 g/dL — ABNORMAL LOW (ref 13.0–17.0)
Immature Granulocytes: 1 %
Lymphocytes Relative: 5 %
Lymphs Abs: 0.5 K/uL — ABNORMAL LOW (ref 0.7–4.0)
MCH: 30.4 pg (ref 26.0–34.0)
MCHC: 31.9 g/dL (ref 30.0–36.0)
MCV: 95.3 fL (ref 80.0–100.0)
Monocytes Absolute: 1.2 K/uL — ABNORMAL HIGH (ref 0.1–1.0)
Monocytes Relative: 12 %
Neutro Abs: 8.4 K/uL — ABNORMAL HIGH (ref 1.7–7.7)
Neutrophils Relative %: 82 %
Platelets: 220 K/uL (ref 150–400)
RBC: 3.65 MIL/uL — ABNORMAL LOW (ref 4.22–5.81)
RDW: 15.3 % (ref 11.5–15.5)
WBC: 10.1 K/uL (ref 4.0–10.5)
nRBC: 0 % (ref 0.0–0.2)

## 2023-10-02 LAB — COMPREHENSIVE METABOLIC PANEL WITH GFR
ALT: 314 U/L — ABNORMAL HIGH (ref 0–44)
AST: 680 U/L — ABNORMAL HIGH (ref 15–41)
Albumin: 3.7 g/dL (ref 3.5–5.0)
Alkaline Phosphatase: 177 U/L — ABNORMAL HIGH (ref 38–126)
Anion gap: 16 — ABNORMAL HIGH (ref 5–15)
BUN: 52 mg/dL — ABNORMAL HIGH (ref 6–20)
CO2: 16 mmol/L — ABNORMAL LOW (ref 22–32)
Calcium: 9 mg/dL (ref 8.9–10.3)
Chloride: 97 mmol/L — ABNORMAL LOW (ref 98–111)
Creatinine, Ser: 6.87 mg/dL — ABNORMAL HIGH (ref 0.61–1.24)
GFR, Estimated: 9 mL/min — ABNORMAL LOW (ref >60)
Glucose, Bld: 136 mg/dL — ABNORMAL HIGH (ref 70–99)
Potassium: 5.3 mmol/L — ABNORMAL HIGH (ref 3.5–5.1)
Sodium: 129 mmol/L — ABNORMAL LOW (ref 135–145)
Total Bilirubin: 2.1 mg/dL — ABNORMAL HIGH (ref 0.0–1.2)
Total Protein: 6.4 g/dL — ABNORMAL LOW (ref 6.5–8.1)

## 2023-10-02 LAB — RENAL FUNCTION PANEL
Albumin: 3.7 g/dL (ref 3.5–5.0)
Anion gap: 16 — ABNORMAL HIGH (ref 5–15)
BUN: 51 mg/dL — ABNORMAL HIGH (ref 6–20)
CO2: 16 mmol/L — ABNORMAL LOW (ref 22–32)
Calcium: 9.1 mg/dL (ref 8.9–10.3)
Chloride: 97 mmol/L — ABNORMAL LOW (ref 98–111)
Creatinine, Ser: 6.25 mg/dL — ABNORMAL HIGH (ref 0.61–1.24)
GFR, Estimated: 11 mL/min — ABNORMAL LOW (ref >60)
Glucose, Bld: 135 mg/dL — ABNORMAL HIGH (ref 70–99)
Phosphorus: 8 mg/dL — ABNORMAL HIGH (ref 2.5–4.6)
Potassium: 5.3 mmol/L — ABNORMAL HIGH (ref 3.5–5.1)
Sodium: 129 mmol/L — ABNORMAL LOW (ref 135–145)

## 2023-10-02 LAB — GLUCOSE, CAPILLARY: Glucose-Capillary: 134 mg/dL — ABNORMAL HIGH (ref 70–99)

## 2023-10-02 MED ORDER — ALBUMIN HUMAN 25 % IV SOLN
25.0000 g | Freq: Four times a day (QID) | INTRAVENOUS | Status: AC
  Administered 2023-10-02: 12.5 g via INTRAVENOUS
  Administered 2023-10-02 – 2023-10-03 (×2): 25 g via INTRAVENOUS
  Filled 2023-10-02 (×3): qty 100

## 2023-10-02 MED ORDER — ORAL CARE MOUTH RINSE: 15.0000 mL | OROMUCOSAL | Status: DC | PRN

## 2023-10-02 MED ORDER — ALBUMIN HUMAN 25 % IV SOLN
INTRAVENOUS | Status: AC
  Filled 2023-10-02: qty 50

## 2023-10-02 MED ORDER — LIDOCAINE HCL URETHRAL/MUCOSAL 2 % EX GEL
1.0000 | Freq: Once | CUTANEOUS | Status: AC
  Administered 2023-10-02: 1
  Filled 2023-10-02: qty 5

## 2023-10-02 MED ORDER — ALBUMIN HUMAN 25 % IV SOLN
25.0000 g | Freq: Once | INTRAVENOUS | Status: AC
  Administered 2023-10-02: 25 g via INTRAVENOUS

## 2023-10-02 MED ORDER — MIDODRINE HCL 5 MG PO TABS
10.0000 mg | ORAL_TABLET | Freq: Three times a day (TID) | ORAL | Status: DC
  Administered 2023-10-04: 10 mg
  Filled 2023-10-02: qty 2

## 2023-10-02 MED ORDER — HEPARIN SODIUM (PORCINE) 5000 UNIT/ML IJ SOLN
5000.0000 [IU] | Freq: Three times a day (TID) | INTRAMUSCULAR | Status: DC
Start: 2023-10-02 — End: 2023-10-03
  Administered 2023-10-02 – 2023-10-03 (×3): 5000 [IU] via SUBCUTANEOUS
  Filled 2023-10-02 (×3): qty 1

## 2023-10-02 MED ORDER — MORPHINE SULFATE (PF) 2 MG/ML IV SOLN
1.0000 mg | INTRAVENOUS | Status: DC | PRN
  Administered 2023-10-02: 1 mg via INTRAVENOUS
  Filled 2023-10-02: qty 1

## 2023-10-02 MED ORDER — SODIUM CHLORIDE 0.9 % IV SOLN: 250.0000 mL | INTRAVENOUS | Status: AC

## 2023-10-02 MED ORDER — SODIUM ZIRCONIUM CYCLOSILICATE 10 G PO PACK
10.0000 g | PACK | Freq: Once | ORAL | Status: DC
Start: 2023-10-02 — End: 2023-10-04

## 2023-10-02 NOTE — Assessment & Plan Note (Signed)
 Secondary to malignancy and medication

## 2023-10-02 NOTE — Progress Notes (Signed)
 Cortrak placement attempted. Tube passed easily through nasopharynx, however, resistance met at about 45cm (external measurement). On tracing, tube appeared to be making a u-turn at the GE junction. Attempted to push air through the tube to facilitate passage into the stomach, but attempts were unsuccessful. Dr. Annella & bedside RN, Madailein, aware.

## 2023-10-02 NOTE — Progress Notes (Addendum)
 NAME:  Marcus Burns, MRN:  [id number], DOB:  1980-06-24, LOS: 3 ADMISSION DATE:  09/28/2023, CONSULTATION DATE:  10/02/23 REFERRING MD:  TRH, CHIEF COMPLAINT:  abdominal pain   History of Present Illness:  43 year old man widely metastatic renal cell carcinoma status post right nephrectomy presented with abdominal pain now transferred to the ICU with shock, liver failure, renal failure, encephalopathy, appears to be actively dying.  Admitted to the hospital 9/23 with abdominal pain.  CT abdomen pelvis did not reveal any surgical emergency.  Did demonstrate worsening peritoneal carcinomatosis, worsening liver metastasis, rectus sheath metastasis.  Likely contributing to pain.  Is placed on pain medications.  He is have progressive renal failure.  Developed hypotension and encephalopathy transferred to the ICU.  Documentation 9/27 via oncologist demonstrates he agreed to DNR.  Currently at the time of my assessment later in the day he is unable to participate in any conversation, does not have capacity.  There is no clear source of infection.  He is anuric.  INR elevated.  LFTs rising.  Creatinine rising.  Multisystem organ failure.  Appears to be actively dying on exam in my evaluation.  Pertinent  Medical History  As per EMR and discussion this note  Significant Hospital Events: Including procedures, antibiotic start and stop dates in addition to other pertinent events     Interim History / Subjective:  A bit more alert this AM, Lfts better, pressors own a bit stil l2 pressors, no UOP, Cr rising, mild hyperkalemia  Objective    Blood pressure 130/68, pulse (!) 112, temperature (!) 97.4 F (36.3 C), temperature source Axillary, resp. rate 16, height 6' 4 (1.93 m), weight 118.6 kg, SpO2 96%.        Intake/Output Summary (Last 24 hours) at 10/02/2023 9191 Last data filed at 10/02/2023 9341 Gross per 24 hour  Intake 2916.26 ml  Output --  Net 2916.26 ml   Filed Weights   09/28/23  1853 10/01/23 0908 10/02/23 0500  Weight: 112.1 kg 118.3 kg 118.6 kg    Examination: General: Chronically ill-appearing lying in bed HENT: Atraumatic normocephalic Lungs: Mild tachypnea on nasal cannula with oxygen saturations high 90s Cardiovascular: Tachycardic Abdomen: Nondistended Extremities: Cool edematous Neuro: Arousable, does not follow commands   Resolved problem list   Assessment and Plan   Metabolic encephalopathy: He is in multisystem organ failure, fear he is actively dying.  In the setting of widely metastatic cancer.  And renal failure. -- Stop opiate medications as this could be accumulating in the setting of his renal failure - some mild improvement since stopping opiates -- Avoid centrally acting medications for now -- Cortrak for meds  Shock: Hypotensive escalating vasopressor requirement.  I suspect this is in the setting of actively dying, multisystem organ failure.  No clear source of infection. -- Follow-up blood cultures, continue cefepime and linezolid, consider de-escalatation if cultures negative in 48 hours -- MAP greater than 65, norepinephrine, vasopressin ordered -- Chest x-ray with atelectasis 9/27  Acute hypoxemic respiratory failure: Likely related to hypoventilation in setting encephalopathy.  Atelectasis seen on CXR so suspect element of shunt. Possibly worsening/developing fluid overload with renal failure. -- O2, goal O2 sat 90%  Acute renal failure on CKD 3A: Possibly to contrast-induced nephropathy versus progressive renal failure and a send and widely metastatic renal cancer. -- Appreciate nephrology assistance, has not responded to Lasix , he is not a dialysis candidate -- Major contributor to active dying, nothing further can be done other than vasopressor support  as above  Widely metastatic renal cell carcinoma with metastasis to liver lung and peritoneal carcinomatosis: He appears to be actively dying.  As such, aggressive interventions,  resuscitative efforts are futile.  Appreciate oncologist assistance in GOC discussions. See IPAL 9/27.   Elevated LFTs and liver failure: Progressively worse in setting of hypotension.  With underlying progressive metastases, I suspect he is developing shock liver and in the process of likely converting to complete fulminant liver failure.  Encephalopathy, elevated INR, and elevated LFTs qualify for the diagnosis of liver failure. -- Avoid hepatotoxic agents -- LFTs improved after starting vasopressors -- cabozantinib  to to continue at wife preference and per oncologist note  Hyperkalemia: In the setting of renal failure.  Can try medication shifting etc. but this is futile and progressive renal failure not a candidate for dialysis. --Lokelma  per tube, NG ordered again  Abdominal pain: Presenting complaint.  Related to worsening metastatic disease worsening peritoneal carcinomatosis as well as worsening hepatic metastases. -- Holding pain medications for now with encephalopathy  H/o of pulm embolism: --hold eliquis  with elevated INR and liver dysfxn --SQ heparin  for DVT ppx  Labs   CBC: Recent Labs  Lab 09/28/23 1032 09/29/23 0625 09/30/23 0534 10/01/23 0000 10/01/23 1011 10/02/23 0451  WBC 9.0 10.3 10.9* 12.6* 10.8* 10.1  NEUTROABS 7.2 8.2* 8.6*  --  9.1* 8.4*  HGB 14.8 14.3 13.3 12.2* 11.9* 11.1*  HCT 47.0 47.4 42.4 38.2* 39.1 34.8*  MCV 97.7 99.6 99.5 98.2 97.8 95.3  PLT 408* 342 272 221 211 220    Basic Metabolic Panel: Recent Labs  Lab 09/28/23 1032 09/29/23 0625 09/30/23 0534 10/01/23 1017 10/01/23 2012 10/02/23 0451  NA 136 133* 130* 132* 127* 129*  129*  K 4.8 4.5 5.5* 5.5* 5.3* 5.3*  5.3*  CL 99 98 100 99 97* 97*  97*  CO2 19* 19* 17* 18* 15* 16*  16*  GLUCOSE 146* 121* 92 91 143* 136*  135*  BUN 26* 33* 44* 48* 58* 52*  51*  CREATININE 2.64* 3.86* 4.89* 6.14* 6.24* 6.87*  6.25*  CALCIUM 9.1 8.8* 8.3* 8.6* 8.6* 9.0  9.1  MG 1.7 1.9  --   --   --    --   PHOS  --   --   --  8.0*  --  8.0*   GFR: Estimated Creatinine Clearance: 21.2 mL/min (A) (by C-G formula based on SCr of 6.25 mg/dL (H)). Recent Labs  Lab 09/30/23 0534 10/01/23 0000 10/01/23 1011 10/02/23 0451  WBC 10.9* 12.6* 10.8* 10.1  LATICACIDVEN  --  1.7  --   --     Liver Function Tests: Recent Labs  Lab 09/28/23 1032 09/29/23 0625 09/30/23 0534 10/01/23 1011 10/01/23 1017 10/02/23 0451  AST 195* 334* 663* 1,042*  --  680*  ALT 95* 162* 253* 380*  --  314*  ALKPHOS 179* 169* 162* 183*  --  177*  BILITOT 1.4* 1.4* 1.6* 1.8*  --  2.1*  PROT 6.3* 6.2* 5.8* 6.5  --  6.4*  ALBUMIN  3.3* 3.3* 2.8* 3.7 3.6 3.7  3.7   Recent Labs  Lab 09/27/23 0855  LIPASE 21   Recent Labs  Lab 10/01/23 1011  AMMONIA 26    ABG No results found for: PHART, PCO2ART, PO2ART, HCO3, TCO2, ACIDBASEDEF, O2SAT   Coagulation Profile: Recent Labs  Lab 10/01/23 1011  INR 2.1*    Cardiac Enzymes: No results for input(s): CKTOTAL, CKMB, CKMBINDEX, TROPONINI in the last 168 hours.  HbA1C: Hgb A1c MFr Bld  Date/Time Value Ref Range Status  01/22/2022 10:37 AM 6.4 4.6 - 6.5 % Final    Comment:    Glycemic Control Guidelines for People with Diabetes:Non Diabetic:  <6%Goal of Therapy: <7%Additional Action Suggested:  >8%   01/01/2021 10:18 AM 6.3 4.6 - 6.5 % Final    Comment:    Glycemic Control Guidelines for People with Diabetes:Non Diabetic:  <6%Goal of Therapy: <7%Additional Action Suggested:  >8%     CBG: Recent Labs  Lab 09/30/23 2207 10/01/23 0635 10/01/23 2207 10/02/23 0504  GLUCAP 97 86 143* 134*    Review of Systems:   Unable to obtain due to encephalopathy  Past Medical History:  He,  has a past medical history of Acute prostatitis (06/19/2007), ALLERGIC RHINITIS (06/19/2007), ANXIETY (06/19/2007), Cancer (HCC), ELEVATED BLOOD PRESSURE WITHOUT DIAGNOSIS OF HYPERTENSION (06/19/2007), GERD (06/19/2007), GERD (gastroesophageal reflux  disease), History of COVID-19 (01/24/2019), History of kidney stones, Hypertension, Inguinal hernia, Migraines, NEPHROLITHIASIS, HX OF (06/19/2007), and PE (pulmonary thromboembolism) (HCC).   Surgical History:   Past Surgical History:  Procedure Laterality Date   CYSTOSCOPY WITH RETROGRADE PYELOGRAM, URETEROSCOPY AND STENT PLACEMENT Bilateral 02/09/2019   Procedure: CYSTOSCOPY WITH RETROGRADE PYELOGRAM, DIAGNOSTIC URETEROSCOPY AND STENT PLACEMENT;  Surgeon: Alvaro Hummer, MD;  Location: [facility];  Service: Urology;  Laterality: Bilateral;  75 MINS   CYSTOSCOPY WITH RETROGRADE PYELOGRAM, URETEROSCOPY AND STENT PLACEMENT Bilateral 02/23/2019   Procedure: CYSTOSCOPY WITH RETROGRADE PYELOGRAM, URETEROSCOPY AND STENT PLACEMENT;  Surgeon: Alvaro Hummer, MD;  Location: [facility];  Service: Urology;  Laterality: Bilateral;  1 HR   HERNIA REPAIR     HOLMIUM LASER APPLICATION Bilateral 02/23/2019   Procedure: HOLMIUM LASER APPLICATION;  Surgeon: Alvaro Hummer, MD;  Location: [facility];  Service: Urology;  Laterality: Bilateral;   ROBOT ASSISTED LAPAROSCOPIC NEPHRECTOMY Right 06/18/2022   Procedure: XI ROBOTIC ASSISTED LAPAROSCOPIC RADICAL NEPHRECTOMY AND PERICAVAL LYMPH NODE DISSECTION;  Surgeon: Alvaro Hummer KATHEE Mickey., MD;  Location: [facility];  Service: Urology;  Laterality: Right;  3 HRS     Social History:   reports that he has quit smoking. He has quit using smokeless tobacco. He reports that he does not drink alcohol and does not use drugs.   Family History:  His family history includes Anxiety disorder in his brother and mother; Coronary artery disease in his father; Dementia in his mother; Diabetes in his brother.   Allergies Allergies  Allergen Reactions   Amlodipine  Other (See Comments)    Edema ( 5 mg)   Doxycycline  Other (See Comments)    Light headed , passed out     Home Medications  Prior to Admission medications   Medication Sig Start Date End Date Taking? Authorizing Provider   acetaminophen  (TYLENOL ) 500 MG tablet Take 1 tablet (500 mg total) by mouth every 6 (six) hours as needed for moderate pain (pain.). 10/14/22  Yes Briana Elgin LABOR, MD  apixaban  (ELIQUIS ) 5 MG TABS tablet Take 1 tablet (5 mg total) by mouth 2 (two) times daily. 05/18/23  Yes Iruku, Praveena, MD  cabozantinib  (CABOMETYX ) 60 MG tablet Take 1 tablet (60 mg total) by mouth daily. Take on an empty stomach, 1 hour before or 2 hours after meals. 09/22/23  Yes Tina Pauletta BROCKS, MD  loperamide (IMODIUM) 2 MG capsule Take by mouth as needed for diarrhea or loose stools.   Yes [provider]  LORazepam  (ATIVAN ) 0.5 MG tablet Take 0.5 mg by mouth at bedtime as needed for anxiety or sleep.  Yes [provider]  metoprolol  succinate (TOPROL -XL) 50 MG 24 hr tablet Take 1 tablet (50 mg total) by mouth daily. Take with or immediately following a meal. 12/24/22  Yes Tina Pauletta BROCKS, MD  oxyCODONE  (OXY IR/ROXICODONE ) 5 MG immediate release tablet Take 1 tablet (5 mg total) by mouth every 6 (six) hours as needed for severe pain (pain score 7-10). 09/27/23  Yes Tina Pauletta BROCKS, MD  telmisartan  (MICARDIS ) 40 MG tablet TAKE 1 TABLET BY MOUTH EVERY DAY 08/26/23  Yes Tina Pauletta BROCKS, MD  traZODone  (DESYREL ) 100 MG tablet Take 1 tablet (100 mg total) by mouth at bedtime as needed for sleep. 09/22/23  Yes Tina Pauletta BROCKS, MD  albuterol  (VENTOLIN  HFA) 108 [phone] Base) MCG/ACT inhaler Inhale 2 puffs into the lungs every 6 (six) hours as needed for wheezing or shortness of breath. Patient not taking: Reported on 09/28/2023 12/24/21   Norleen Lynwood ORN, MD  belzutifan  (WELIREG ) 40 MG tablet Take 3 tablets (120 mg total) by mouth daily. 09/22/23   Tina Pauletta BROCKS, MD  cetirizine  (ZYRTEC ) 10 MG tablet Take 10 mg by mouth daily. Patient not taking: Reported on 09/28/2023    [provider]  docusate sodium  (COLACE) 100 MG capsule Take 1 capsule (100 mg total) by mouth 2 (two) times daily. Patient not taking: Reported on  09/28/2023 06/18/22   Cory Palma, PA-C  triamcinolone  (NASACORT ) 55 MCG/ACT AERO nasal inhaler Place 2 sprays into the nose daily. Patient not taking: Reported on 02/01/2019 05/13/17 02/01/19  Norleen Lynwood ORN, MD     Critical care time:     CRITICAL CARE Performed by: Donnice JONELLE Beals   Total critical care time: 39 minutes  Critical care time was exclusive of separately billable procedures and treating other patients.  Critical care was necessary to treat or prevent imminent or life-threatening deterioration.  Critical care was time spent personally by me on the following activities: development of treatment plan with patient and/or surrogate as well as nursing, discussions with consultants, evaluation of patient's response to treatment, examination of patient, obtaining history from patient or surrogate, ordering and performing treatments and interventions, ordering and review of laboratory studies, ordering and review of radiographic studies, pulse oximetry and re-evaluation of patient's condition.  Donnice JONELLE Beals, MD See [place]

## 2023-10-02 NOTE — Assessment & Plan Note (Signed)
 Not complaining of pain this morning Agree with holding narcotics for altered mental status

## 2023-10-02 NOTE — Progress Notes (Signed)
 Marcus Burns   DOB:03/21/1980   FM#:[id number]    ASSESSMENT & PLAN:  Isamu Trammel. Prieto is a 43 year old male patient with oncologic history significant for metastatic RCC with peritoneal carcinomatosis, liver and lung mets. Patient admitted on 09/28/2023 for uncontrolled abdominal pain.   Clinically worsening with progressive liver enzyme elevated, renal failure, hypotension, anuric and transferred to ICU on 9/27.    9/27. patient was placed on pressors.  Discussed with Raesean, and his wife Alan clinically deteriorating.  Recommend DNR and DNI in the setting of continuing deterioration. Continue other reversible and supportive measures as able.  Appreciate ICU assistance.  CODE STATUS have been changed to DO NOT RESUSCITATE.  9/28.  Report of interactive overnight.  Mild improvement of LFT.  Discussed with ICU, and wife, continue current supportive measures.   Assessment & Plan ARF (acute renal failure) With profound liver metastases causing endorgan damage Initially hypotension, and possible contrast nephropathy Continue IV fluid as able Appreciate nephrology and ICU management Hypotension Pressors and IVF per ICU team Appreciate ICU team assistance Metastatic renal cell carcinoma (HCC) Profound liver metastases with recent progression Continue belzutifan  120 mg daily and cabozantinib  60 mg daily as able Uncontrolled pain Not complaining of pain this morning Agree with holding narcotics for altered mental status  Anemia Secondary to malignancy and medication CKD (chronic kidney disease) Underlying CKD with acute renal failure Goals of care, counseling/discussion Ongoing discussion.  Agree with DNR/DNI  Code Status DNR/DNI  Goals of care discussed  All questions were answered.    Thank you for the consult. Will follow with you.  [name], MD 10/02/2023 10:53 AM  Subjective:  Daine is resting comfortably.  He denies any pain, abdominal pain.  He appears  somnolent and mental status reported in an.  Sister-in-law and her husband were at bedside, report he was more alert overnight.  Currently on cefepime, linezolid, norepinephrine, vasopressor.  Objective:  Vitals:   10/02/23 1030 10/02/23 1045  BP: 122/64 (!) 116/57  Pulse: (!) 110 (!) 111  Resp: 15 16  Temp:    SpO2: 95% 95%     Intake/Output Summary (Last 24 hours) at 10/02/2023 1053 Last data filed at 10/02/2023 1026 Gross per 24 hour  Intake 3031.57 ml  Output --  Net 3031.57 ml    GENERAL: alert, no distress and comfortable SKIN: skin color normal LUNGS:  normal breathing effort.  ABDOMEN: abdomen firm, wince on right side palpation Musculoskeletal: Bilateral ankle edema NEURO: alert with very soft voice.  Somnolent   Labs:  Recent Labs    09/30/23 0534 10/01/23 1011 10/01/23 1017 10/01/23 2012 10/02/23 0451  NA 130*  --  132* 127* 129*  129*  K 5.5*  --  5.5* 5.3* 5.3*  5.3*  CL 100  --  99 97* 97*  97*  CO2 17*  --  18* 15* 16*  16*  GLUCOSE 92  --  91 143* 136*  135*  BUN 44*  --  48* 58* 52*  51*  CREATININE 4.89*  --  6.14* 6.24* 6.87*  6.25*  CALCIUM 8.3*  --  8.6* 8.6* 9.0  9.1  GFRNONAA 14*  --  11* 11* 9*  11*  PROT 5.8* 6.5  --   --  6.4*  ALBUMIN  2.8* 3.7 3.6  --  3.7  3.7  AST 663* 1,042*  --   --  680*  ALT 253* 380*  --   --  314*  ALKPHOS 162* 183*  --   --  177*  BILITOT 1.6* 1.8*  --   --  2.1*  BILIDIR  --  1.2*  --   --   --   IBILI  --  0.6  --   --   --     Studies:  DG CHEST PORT 1 VIEW Result Date: 10/01/2023 EXAM: 1 VIEW(S) XRAY OF THE CHEST 10/01/2023 02:15:00 PM COMPARISON: None available. CLINICAL HISTORY: Shock (HCC) [id number]. PICC line placement. FINDINGS: LINES, TUBES AND DEVICES: Right upper extremity PICC in place with tip at superior cavoatrial junction. LUNGS AND PLEURA: Low lung volumes. Bibasilar opacities which may reflect atelectasis or airspace disease. No pulmonary edema. No pleural effusion. No pneumothorax.  HEART AND MEDIASTINUM: No acute abnormality of the cardiac and mediastinal silhouettes. BONES AND SOFT TISSUES: No acute osseous abnormality. IMPRESSION: 1. Bibasilar opacities that may represent atelectasis or airspace disease, in the setting of low lung volumes. 2. Right upper extremity PICC with tip at the superior cavoatrial junction. Electronically signed by: Waddell Calk MD 10/01/2023 02:26 PM EDT RP Workstation: [id number]   US  EKG SITE RITE Result Date: 10/01/2023 If Site Rite image not attached, placement could not be confirmed due to current cardiac rhythm.  US  RENAL Result Date: 09/30/2023 CLINICAL DATA:  Acute kidney injury EXAM: RENAL / URINARY TRACT ULTRASOUND COMPLETE COMPARISON:  CT abdomen and pelvis 09/27/2023 FINDINGS: Right Kidney: Right kidney is surgically absent. No mass demonstrated in the visualized right renal fossa. Numerous cystic lesions are demonstrated in the visualized liver. See prior CT report. Left Kidney: Renal measurements: 9.1 x 4.8 x 5.8 cm = volume: 132 mL. Diffusely increased parenchymal echotexture. Grossly normal parenchymal thickness. No hydronephrosis. Bladder: Bladder is not visualized due to overlying bowel gas. Other: Technically limited examination due to patient's body habitus and bowel gas. IMPRESSION: 1. Right kidney is surgically absent. Visualized left kidney demonstrates increased parenchymal echotexture suggesting chronic medical renal disease. No hydronephrosis. Electronically Signed   By: Elsie Gravely M.D.   On: 09/30/2023 20:17   CT ABDOMEN PELVIS W CONTRAST Result Date: 09/27/2023 CLINICAL DATA:  Abdominal pain, acute, nonlocalized EXAM: CT ABDOMEN AND PELVIS WITH CONTRAST TECHNIQUE: Multidetector CT imaging of the abdomen and pelvis was performed using the standard protocol following bolus administration of intravenous contrast. RADIATION DOSE REDUCTION: This exam was performed according to the departmental dose-optimization program which  includes automated exposure control, adjustment of the mA and/or kV according to patient size and/or use of iterative reconstruction technique. CONTRAST:  80mL OMNIPAQUE  IOHEXOL  300 MG/ML  SOLN COMPARISON:  09/16/2023 FINDINGS: Lower chest: Trace right pleural effusion with right basilar atelectasis.Unchanged 2 mm left lower lobe nodule abutting the hemidiaphragm (axial 42). Hepatobiliary: Enlarged liver, which is full of innumerable hypodense masses, without significant interval change.For example, the largest mass in the central right hepatic lobe measures 5.7 x 8.4 cm (axial 38), previously measuring 5.2 x 6.6 cm.The gallbladder was not well visualized or evaluated. No intrahepatic or extrahepatic biliary ductal dilation. The portal veins are patent. Pancreas: No mass or main ductal dilation. No peripancreatic inflammation or fluid collection. Spleen: Normal size. No mass. Adrenals/Urinary Tract: No adrenal masses. The right kidney is surgically absent. No nephrolithiasis or hydronephrosis. The urinary bladder is completely decompressed. Stomach/Bowel: The stomach is decompressed without focal abnormality. No small bowel wall thickening or inflammation. No small bowel obstruction.Normal appendix. Vascular/Lymphatic: No aortic aneurysm. No intraabdominal or pelvic lymphadenopathy. Nearly occlusive thrombus again noted within the intrahepatic IVC (axial 45). Reproductive: No prostatomegaly.Small volume free fluid in the pelvis.  Other: Peritoneal nodule along the ventral abdominal wall measures 2.2 x 4.1 cm (previously 1.7 x 3.3 cm), axial 64. multiple additional small areas of nodularity in the epigastric fat adjacent to the ventral abdominal wall (axial 55), more prominent than on the prior study. No pneumoperitoneum. Peritoneal thickening in the right paracolic gutter is also noted (axial 79). Mixed gas and fluid collection in the hepatorenal fossa is otherwise unchanged. Musculoskeletal: No acute fracture or  destructive lesion. Moderate anasarca. Intramuscular metastatic disease in the inferior right rectus sheath measuring 1.4 cm (axial 76). IMPRESSION: 1. No acute intra-abdominal or pelvic abnormality. 2. Extensive intrahepatic metastases again noted filling the entire hepatic parenchyma, with slight enlargement of the index mass in the central right hepatic lobe, measuring 5.7 x 8.4 cm (previously, 5.2 x 6.6 cm, axial 38). 3. Worsening of the peritoneal metastatic disease. For example, the peritoneal nodule subjacent to the midline ventral incision (axial 64), measures 2.2 x 4.1 cm (previously, 1.7 x 3.3 cm). Trace malignant ascites in the pelvis. 4. Intramuscular metastatic disease along the inferior right rectus sheath measuring 1.4 cm (axial 76). 5. Similar appearance of the nearly occlusive thrombus within the intrahepatic IVC. Electronically Signed   By: Rogelia Myers M.D.   On: 09/27/2023 12:17   CT CHEST ABDOMEN PELVIS W CONTRAST Result Date: 09/19/2023 CLINICAL DATA:  RCC with liver Mets, clear cell carcinoma of right kidney. * Tracking Code: BO * EXAM: CT CHEST, ABDOMEN, AND PELVIS WITH CONTRAST TECHNIQUE: Multidetector CT imaging of the chest, abdomen and pelvis was performed following the standard protocol during bolus administration of intravenous contrast. RADIATION DOSE REDUCTION: This exam was performed according to the departmental dose-optimization program which includes automated exposure control, adjustment of the mA and/or kV according to patient size and/or use of iterative reconstruction technique. CONTRAST:  OMNIPAQUE  IOHEXOL  300 MG/ML  SOLN COMPARISON:  Multiple priors including CT February 02, 2023 FINDINGS: CT CHEST FINDINGS Cardiovascular: Normal caliber thoracic aorta. Normal size heart. No significant pericardial effusion/thickening. Mediastinum/Nodes: No suspicious thyroid  nodule. No pathologically enlarged mediastinal, hilar or axillary lymph nodes. Lungs/Pleura: Left lower  lobe pulmonary nodule measures 1-2 mm on image 111/4 previously 2 mm. No new suspicious pulmonary nodules or masses. Right lower lobe atelectasis versus scarring. Musculoskeletal: No aggressive lytic or blastic lesion of bone. CT ABDOMEN PELVIS FINDINGS Hepatobiliary: Innumerable bilobar hepatic lesions are progressed from prior examination. For reference: -indexed lesion in the dome of the liver measures 5.2 x 2.9 cm on image 43/2 previously 2.3 x 2.2 cm. Fluid collection in Morison's pouch described on prior examination not confidently seen on today's study. Cholelithiasis.  No biliary ductal dilation. Pancreas: No pancreatic ductal dilation or evidence of acute inflammation. Spleen: No splenomegaly. Adrenals/Urinary Tract: Right adrenal gland and kidney are surgically absent Left adrenal gland and kidney are unremarkable. Urinary bladder is minimally distended. Stomach/Bowel: Stomach is unremarkable for degree of distension. No pathologic dilation of small or large bowel. No evidence of acute bowel inflammation. Vascular/Lymphatic: Mixed attenuating nodularity in the intrahepatic IVC on image 80/2 is similar prior suspicious for thrombus. No pathologically enlarged abdominal or pelvic lymph nodes identified. Reproductive: Prostate gland is visualized. Other: Overall decreased omental caking with trace abdominopelvic free fluid. Nodular focus in the anterior abdomen subjacent to the incision line measures 3.3 x 1.7 cm on image 87/2 previously 4.1 x 3.0 cm. Musculoskeletal: No aggressive lytic or blastic lesion of bone. IMPRESSION: 1. Innumerable bilobar hepatic lesions are progressed from prior examination, consistent with worsening hepatic metastatic  disease. 2. Overall decreased omental caking with trace abdominopelvic free fluid. 3. Mixed attenuating nodularity in the intrahepatic IVC is similar prior suspicious for thrombus. 4. Left lower lobe pulmonary nodule measures 1-2 mm previously 2 mm. No new suspicious  pulmonary nodules or masses. 5. Cholelithiasis. Electronically Signed   By: Reyes Holder M.D.   On: 09/19/2023 16:33

## 2023-10-02 NOTE — Assessment & Plan Note (Signed)
 Pressors and IVF per ICU team Appreciate ICU team assistance

## 2023-10-02 NOTE — Assessment & Plan Note (Signed)
 With profound liver metastases causing endorgan damage Initially hypotension, and possible contrast nephropathy Continue IV fluid as able Appreciate nephrology and ICU management

## 2023-10-02 NOTE — Progress Notes (Signed)
 eLink Physician-Brief Progress Note Patient Name: Marcus Burns DOB: Oct 29, 1980 MRN: [id number]   Date of Service  10/02/2023  HPI/Events of Note  Patient complaining of pain to liver. Metastatic renal cell CA with liver metastasis and peritoneal carcinomatosis  eICU Interventions  Morphine  1 mg q 3 prn ordered x 3 doses with BP precaution     Intervention Category Intermediate Interventions: Pain - evaluation and management  Damien ONEIDA Grout 10/02/2023, 9:05 PM

## 2023-10-02 NOTE — Consult Note (Signed)
 WOC Nurse Consult Note:  Reason for Consult: DTPI sacrum , L medial thigh  Wound type: 1. Deep Tissue Pressure Injury sacrum that is evolving with a red moist area noted to buttock  2.  Full thickness L medial thigh ? Infectious  vs skin tear  red moist  Pressure Injury POA: no  Measurement:see nursing flowsheet  Wound bed: as above  Drainage (amount, consistency, odor) see nursing flowsheet  Periwound: Dressing procedure/placement/frequency:  Cleanse sacrum/buttocks wound with NS, apply Xeroform gauze Soila [phone]) to wound bed daily and secure with silicone foam.  Cleanse L medial thigh wound with NS, apply silver hydrofiber Soila [phone]) to wound bed daily and secure with silicone foam or ABD pad and tape depending on drainage amount.   WOC team will follow every 7 to 10 days for sacral wound.  POC discussed with bedside nurse.    Thank you,    Powell Bar MSN, RN-BC, [employer]

## 2023-10-02 NOTE — Assessment & Plan Note (Signed)
 Profound liver metastases with recent progression Continue belzutifan  120 mg daily and cabozantinib  60 mg daily as able

## 2023-10-02 NOTE — Progress Notes (Signed)
 Patient ID: Marcus Burns, male   DOB: 01/23/1980, 43 y.o.   MRN: [id number] [facility] Progress Note   Assessment/ Plan:   1.  Acute kidney injury on chronic kidney disease stage IIIa: Appears to be from a combination of volume contraction and contrast-induced nephropathy based on initial labs/sequence of events.  No evidence of obstruction to solitary left kidney on imaging.  Unfortunately remains anuric with dense acute kidney injury that is likely to persist with his hemodynamic instability and likely to contribute to his imminent mortality.  Appreciate input from oncology and CCM service and his management. 2.  Hyponatremia: Secondary to acute kidney injury and impaired free water  handling +/- nonosmotic ADH release with pain.  Unsuccessful diuretic challenge earlier, hypotension now prohibitive to reattempt. 3.  Hyperkalemia: Previously treated with Lokelma  however potassium level rising again and has been treated with rectal Kayexalate overnight as well as Lokelma . 4.  Metastatic renal cell carcinoma: Management per oncology with resumption of chemotherapy as permitted.  Subjective:   Transferred to ICU yesterday with hemodynamic instability/shock for pressor support.  Worsening encephalopathy with evidence of liver injury noted as well.   Objective:   BP (!) 116/57   Pulse (!) 111   Temp (!) 97.4 F (36.3 C) (Axillary)   Resp 16   Ht 6' 4 (1.93 m)   Wt 118.6 kg   SpO2 95%   BMI 31.83 kg/m   Intake/Output Summary (Last 24 hours) at 10/02/2023 1129 Last data filed at 10/02/2023 1026 Gross per 24 hour  Intake 3002.45 ml  Output --  Net 3002.45 ml   Weight change:   Physical Exam: Gen: Lethargic, resting comfortably in bed.  Family at bedside CVS: Pulse regular tachycardia, S1 and S2 normal without murmur or gallop Resp: Diminished breath sounds over bases, poor inspiratory effort.  No rales/rhonchi Abd: Soft, obese/protuberant, mild tenderness over lower  quadrants Ext: Asymmetric lower extremity edema, left leg greater than right leg  Imaging: DG CHEST PORT 1 VIEW Result Date: 10/01/2023 EXAM: 1 VIEW(S) XRAY OF THE CHEST 10/01/2023 02:15:00 PM COMPARISON: None available. CLINICAL HISTORY: Shock (HCC) [id number]. PICC line placement. FINDINGS: LINES, TUBES AND DEVICES: Right upper extremity PICC in place with tip at superior cavoatrial junction. LUNGS AND PLEURA: Low lung volumes. Bibasilar opacities which may reflect atelectasis or airspace disease. No pulmonary edema. No pleural effusion. No pneumothorax. HEART AND MEDIASTINUM: No acute abnormality of the cardiac and mediastinal silhouettes. BONES AND SOFT TISSUES: No acute osseous abnormality. IMPRESSION: 1. Bibasilar opacities that may represent atelectasis or airspace disease, in the setting of low lung volumes. 2. Right upper extremity PICC with tip at the superior cavoatrial junction. Electronically signed by: Waddell Calk MD 10/01/2023 02:26 PM EDT RP Workstation: [id number]   US  EKG SITE RITE Result Date: 10/01/2023 If Site Rite image not attached, placement could not be confirmed due to current cardiac rhythm.  US  RENAL Result Date: 09/30/2023 CLINICAL DATA:  Acute kidney injury EXAM: RENAL / URINARY TRACT ULTRASOUND COMPLETE COMPARISON:  CT abdomen and pelvis 09/27/2023 FINDINGS: Right Kidney: Right kidney is surgically absent. No mass demonstrated in the visualized right renal fossa. Numerous cystic lesions are demonstrated in the visualized liver. See prior CT report. Left Kidney: Renal measurements: 9.1 x 4.8 x 5.8 cm = volume: 132 mL. Diffusely increased parenchymal echotexture. Grossly normal parenchymal thickness. No hydronephrosis. Bladder: Bladder is not visualized due to overlying bowel gas. Other: Technically limited examination due to patient's body habitus and bowel gas. IMPRESSION: 1.  Right kidney is surgically absent. Visualized left kidney demonstrates increased parenchymal  echotexture suggesting chronic medical renal disease. No hydronephrosis. Electronically Signed   By: Elsie Gravely M.D.   On: 09/30/2023 20:17    Labs: BMET Recent Labs  Lab 09/27/23 0855 09/28/23 1032 09/29/23 0625 09/30/23 0534 10/01/23 1017 10/01/23 2012 10/02/23 0451  NA 135 136 133* 130* 132* 127* 129*  129*  K 4.2 4.8 4.5 5.5* 5.5* 5.3* 5.3*  5.3*  CL 101 99 98 100 99 97* 97*  97*  CO2 19* 19* 19* 17* 18* 15* 16*  16*  GLUCOSE 121* 146* 121* 92 91 143* 136*  135*  BUN 21* 26* 33* 44* 48* 58* 52*  51*  CREATININE 1.84* 2.64* 3.86* 4.89* 6.14* 6.24* 6.87*  6.25*  CALCIUM 9.2 9.1 8.8* 8.3* 8.6* 8.6* 9.0  9.1  PHOS  --   --   --   --  8.0*  --  8.0*   CBC Recent Labs  Lab 09/29/23 0625 09/30/23 0534 10/01/23 0000 10/01/23 1011 10/02/23 0451  WBC 10.3 10.9* 12.6* 10.8* 10.1  NEUTROABS 8.2* 8.6*  --  9.1* 8.4*  HGB 14.3 13.3 12.2* 11.9* 11.1*  HCT 47.4 42.4 38.2* 39.1 34.8*  MCV 99.6 99.5 98.2 97.8 95.3  PLT 342 272 221 211 220    Medications:     belzutifan   120 mg Oral Daily   cabozantinib   60 mg Oral Daily   Chlorhexidine  Gluconate Cloth  6 each Topical Daily   heparin  injection (subcutaneous)  5,000 Units Subcutaneous Q8H   lidocaine   1 Application Other Once   midodrine   10 mg Per Tube TID WC   pantoprazole   40 mg Oral Q0600   polyethylene glycol  17 g Oral Daily   senna-docusate  1 tablet Oral BID   sodium chloride  flush  10-40 mL Intracatheter Q12H   sodium chloride  flush  3 mL Intravenous Q12H   sodium zirconium cyclosilicate   10 g Per Tube Once    Gordy Blanch, MD 10/02/2023, 11:29 AM

## 2023-10-02 NOTE — Assessment & Plan Note (Signed)
 Underlying CKD with acute renal failure

## 2023-10-02 NOTE — Plan of Care (Incomplete)
  Problem: Education: Goal: Knowledge of General Education information will improve Description: Including pain rating scale, medication(s)/side effects and non-pharmacologic comfort measures Outcome: Progressing   Problem: Health Behavior/Discharge Planning: Goal: Ability to manage health-related needs will improve Outcome: Progressing   Problem: Clinical Measurements: Goal: Ability to maintain clinical measurements within normal limits will improve Outcome: Progressing Goal: Will remain free from infection Outcome: Progressing Goal: Diagnostic test results will improve Outcome: Progressing Goal: Respiratory complications will improve Outcome: Progressing Goal: Cardiovascular complication will be avoided Outcome: Progressing   Problem: Coping: Goal: Level of anxiety will decrease Outcome: Progressing   Problem: Elimination: Goal: Will not experience complications related to urinary retention Outcome: Progressing   Problem: Pain Managment: Goal: General experience of comfort will improve and/or be controlled Outcome: Progressing   Problem: Safety: Goal: Ability to remain free from injury will improve Outcome: Progressing   Problem: Skin Integrity: Goal: Risk for impaired skin integrity will decrease Outcome: Progressing

## 2023-10-02 NOTE — Plan of Care (Signed)
  Problem: Clinical Measurements: Goal: Respiratory complications will improve Outcome: Progressing   Problem: Education: Goal: Knowledge of General Education information will improve Description: Including pain rating scale, medication(s)/side effects and non-pharmacologic comfort measures Outcome: Not Progressing   Problem: Clinical Measurements: Goal: Cardiovascular complication will be avoided Outcome: Not Progressing   Problem: Activity: Goal: Risk for activity intolerance will decrease Outcome: Not Progressing   Problem: Nutrition: Goal: Adequate nutrition will be maintained Outcome: Not Progressing   Problem: Elimination: Goal: Will not experience complications related to bowel motility Outcome: Not Progressing Goal: Will not experience complications related to urinary retention Outcome: Not Progressing   Problem: Pain Managment: Goal: General experience of comfort will improve and/or be controlled Outcome: Not Progressing   Problem: Skin Integrity: Goal: Risk for impaired skin integrity will decrease Outcome: Not Progressing

## 2023-10-02 NOTE — Assessment & Plan Note (Signed)
 Ongoing discussion.  Agree with DNR/DNI

## 2023-10-03 DIAGNOSIS — Z515 Encounter for palliative care: Secondary | ICD-10-CM

## 2023-10-03 DIAGNOSIS — C649 Malignant neoplasm of unspecified kidney, except renal pelvis: Secondary | ICD-10-CM | POA: Diagnosis not present

## 2023-10-03 DIAGNOSIS — R52 Pain, unspecified: Secondary | ICD-10-CM | POA: Diagnosis not present

## 2023-10-03 DIAGNOSIS — K7689 Other specified diseases of liver: Secondary | ICD-10-CM | POA: Diagnosis not present

## 2023-10-03 DIAGNOSIS — Z7189 Other specified counseling: Secondary | ICD-10-CM | POA: Diagnosis not present

## 2023-10-03 DIAGNOSIS — C7889 Secondary malignant neoplasm of other digestive organs: Secondary | ICD-10-CM

## 2023-10-03 DIAGNOSIS — G893 Neoplasm related pain (acute) (chronic): Secondary | ICD-10-CM | POA: Diagnosis not present

## 2023-10-03 DIAGNOSIS — C799 Secondary malignant neoplasm of unspecified site: Secondary | ICD-10-CM

## 2023-10-03 DIAGNOSIS — Z79899 Other long term (current) drug therapy: Secondary | ICD-10-CM

## 2023-10-03 DIAGNOSIS — C787 Secondary malignant neoplasm of liver and intrahepatic bile duct: Secondary | ICD-10-CM

## 2023-10-03 DIAGNOSIS — R4589 Other symptoms and signs involving emotional state: Secondary | ICD-10-CM | POA: Diagnosis not present

## 2023-10-03 DIAGNOSIS — N179 Acute kidney failure, unspecified: Secondary | ICD-10-CM | POA: Diagnosis not present

## 2023-10-03 LAB — CBC
HCT: 30.6 % — ABNORMAL LOW (ref 39.0–52.0)
Hemoglobin: 10 g/dL — ABNORMAL LOW (ref 13.0–17.0)
MCH: 29.9 pg (ref 26.0–34.0)
MCHC: 32.7 g/dL (ref 30.0–36.0)
MCV: 91.6 fL (ref 80.0–100.0)
Platelets: 194 K/uL (ref 150–400)
RBC: 3.34 MIL/uL — ABNORMAL LOW (ref 4.22–5.81)
RDW: 15.1 % (ref 11.5–15.5)
WBC: 8.5 K/uL (ref 4.0–10.5)
nRBC: 0 % (ref 0.0–0.2)

## 2023-10-03 LAB — HEPATIC FUNCTION PANEL
ALT: 264 U/L — ABNORMAL HIGH (ref 0–44)
AST: 486 U/L — ABNORMAL HIGH (ref 15–41)
Albumin: 3.8 g/dL (ref 3.5–5.0)
Alkaline Phosphatase: 163 U/L — ABNORMAL HIGH (ref 38–126)
Bilirubin, Direct: 1.7 mg/dL — ABNORMAL HIGH (ref 0.0–0.2)
Indirect Bilirubin: 0.9 mg/dL (ref 0.3–0.9)
Total Bilirubin: 2.6 mg/dL — ABNORMAL HIGH (ref 0.0–1.2)
Total Protein: 6.4 g/dL — ABNORMAL LOW (ref 6.5–8.1)

## 2023-10-03 LAB — RENAL FUNCTION PANEL
Albumin: 3.8 g/dL (ref 3.5–5.0)
Anion gap: 16 — ABNORMAL HIGH (ref 5–15)
BUN: 56 mg/dL — ABNORMAL HIGH (ref 6–20)
CO2: 16 mmol/L — ABNORMAL LOW (ref 22–32)
Calcium: 9.3 mg/dL (ref 8.9–10.3)
Chloride: 96 mmol/L — ABNORMAL LOW (ref 98–111)
Creatinine, Ser: 6.79 mg/dL — ABNORMAL HIGH (ref 0.61–1.24)
GFR, Estimated: 10 mL/min — ABNORMAL LOW (ref >60)
Glucose, Bld: 124 mg/dL — ABNORMAL HIGH (ref 70–99)
Phosphorus: 7.9 mg/dL — ABNORMAL HIGH (ref 2.5–4.6)
Potassium: 5.2 mmol/L — ABNORMAL HIGH (ref 3.5–5.1)
Sodium: 129 mmol/L — ABNORMAL LOW (ref 135–145)

## 2023-10-03 LAB — APTT: aPTT: 98 s — ABNORMAL HIGH (ref 24–36)

## 2023-10-03 LAB — HEPARIN LEVEL (UNFRACTIONATED): Heparin Unfractionated: >1.1 [IU]/mL — ABNORMAL HIGH (ref 0.30–0.70)

## 2023-10-03 MED ORDER — HEPARIN (PORCINE) 25000 UT/250ML-% IV SOLN
1750.0000 [IU]/h | INTRAVENOUS | Status: DC
  Administered 2023-10-03 – 2023-10-04 (×2): 1750 [IU]/h via INTRAVENOUS
  Filled 2023-10-03 (×2): qty 250

## 2023-10-03 MED ORDER — SODIUM CHLORIDE 0.9 % IV SOLN: INTRAVENOUS | Status: AC

## 2023-10-03 MED ORDER — FENTANYL CITRATE PF 50 MCG/ML IJ SOSY
25.0000 ug | PREFILLED_SYRINGE | INTRAMUSCULAR | Status: DC | PRN
  Administered 2023-10-03 – 2023-10-04 (×5): 25 ug via INTRAVENOUS
  Filled 2023-10-03 (×6): qty 1

## 2023-10-03 MED ORDER — SODIUM CHLORIDE 0.9 % IV SOLN: Freq: Once | INTRAVENOUS | Status: AC

## 2023-10-03 MED ORDER — MORPHINE SULFATE (PF) 2 MG/ML IV SOLN: 1.0000 mg | INTRAVENOUS | Status: DC | PRN

## 2023-10-03 NOTE — Assessment & Plan Note (Signed)
 Secondary to malignancy and medication

## 2023-10-03 NOTE — Consult Note (Signed)
 Consultation Note Date: 10/03/2023   Patient Name: Marcus Burns  DOB: 03/17/1980  MRN: [id number]  Age / Sex: 43 y.o., male   PCP: Norleen Lynwood ORN, MD Referring Physician: Jude Harden GAILS, MD  Reason for Consultation: Establishing goals of care     Chief Complaint/History of Present Illness:   Patient is a 43 year old male with a past medical history of metastatic renal cell carcinoma status post right nephrectomy with metastatic disease to liver, peritoneum, and right rectus sheath, hypertension, PE, GERD, and nephrolithiasis who was admitted on 09/28/2023 for management of abdominal pain.  During hospitalization, patient has deteriorated requiring transfer to ICU for management of shock in the setting of multisystem organ failure, metabolic encephalopathy, acute hypoxic respiratory failure, acute renal failure, and worsening liver function.  Nephrology and oncology providing recommendations.  Palliative medicine team consulted to assist with complex medical decision making.  Extensive review of EMR prior to presenting to bedside including recent documentation from PCCM, nephrology, and oncology.  Review of recent CMP noting potassium elevated to 5.2, BUN elevated to 56, creatinine elevated at 6.79 with estimated GFR of 10, and bilirubin elevated to 2.6.  Personally reviewed CT abdomen pelvis noting worsening peritoneal and liver cancer burden.  Discussed care with oncologist, PCCM, nephrologist, and RN to coordinate care.  Patient is not a candidate for dialysis.  Patient having worsening kidney function with very little urine output.  Patient's mentation waxing and waning.  Patient still requiring pressor support for hypotension with vasopressin.  ------------------------------------------------------------------------------------------------------------- Advance Care Planning Conversation  Pertinent diagnosis: Metastatic renal cancer with worsening disease burden to liver and peritoneum,  acute renal failure, shock requiring pressor support  The patient and family consented to a voluntary Advance Care Planning Conversation in person. Individuals present for the conversation: Discussed care with patient and his wife at bedside.  This provider was present for entirety of conversation.  Summary of the conversation:  Presented to bedside to see patient.  Patient's sister-in-law present at bedside.  She noted that patient's wife would be returning to bedside shortly.  Was able to introduce myself to the patient as a member of the palliative medicine team.  Noted would plan to discuss care with patient when wife presented to bedside and he was agreeing with this.  Patient is awake though appears very fatigued.  He is able to nod head appropriately and whisper a couple words at a time.  Shortly after discussion, was noted patient's wife was present.  Able to present to bedside with wife to discuss care with patient.  Again introduced myself as a member of the palliative medicine team and my role in patient's medical journey.  Spent time updating regarding discussion with oncologist, nephrologist, and PCCM provider today.  Expressed concern that despite appropriate medical interventions, patient's renal function has continued to worsen.  Expressed concern that with patient's underlying metastatic cancer and worsening renal failure, time could be growing short.  Spent time answering questions as able regarding this.  Patient was able to whisper that he acknowledged he heard he was in kidney failure.  Noted oncologist would likely be by to discuss care with them as well today.  Expressed that at this time, would be appropriate to consider focusing on patient's comfort knowing that time is growing shorter based on current evaluation.  Patient and wife acknowledged they heard this.  Patient was appropriately tearful at this time and did not want to discuss specifics about what comfort focused care would  entail.  Respected patient's wishes regarding this and noted palliative medicine team will continue to follow along with patient's medical journey to assist with discussions moving forward.  All questions answered at that time.  Outcome of the conversations and/or documents completed:  Continuing appropriate medical management at this time.  Patient acknowledging that he is currently suffering from acute renal failure.  I spent 35 minutes providing separately identifiable ACP services with the patient and/or surrogate decision maker in a voluntary, in-person conversation discussing the patient's wishes and goals as detailed in the above note.  Tinnie Radar, DO Palliative Medicine Provider  -------------------------------------------------------------------------------------------------------------  Discussed with care team after visit.  Oncologist able to visit with patient and wife later in day.  Oncologist noted that based on discussion with wife and patient, we will continue current medical interventions.  It was discussed that if patient continues to deteriorate, it is recommended that patient be allowed to pass naturally with comfort measures in place.  Again palliative medicine team will continue to follow along to engage in conversations as able and appropriate.  Primary Diagnoses  Present on Admission:  Uncontrolled pain  Anxiety  GERD  ARF (acute renal failure)  Clear cell carcinoma of kidney (HCC)  Hypertension  Metastatic renal cell carcinoma (HCC)  Anemia  CKD (chronic kidney disease)    Past Medical History:  Diagnosis Date   Acute prostatitis 06/19/2007   ALLERGIC RHINITIS 06/19/2007   ANXIETY 06/19/2007   Cancer (HCC)    kidney   ELEVATED BLOOD PRESSURE WITHOUT DIAGNOSIS OF HYPERTENSION 06/19/2007   GERD 06/19/2007   GERD (gastroesophageal reflux disease)    History of COVID-19 01/24/2019   History of kidney stones    Hypertension    Inguinal hernia    Migraines     NEPHROLITHIASIS, HX OF 06/19/2007   PE (pulmonary thromboembolism) (HCC)    Social History   Socioeconomic History   Marital status: Married    Spouse name: Not on file   Number of children: Not on file   Years of education: Not on file   Highest education level: Not on file  Occupational History   Not on file  Tobacco Use   Smoking status: Former   Smokeless tobacco: Former  Building services engineer status: Never Used  Substance and Sexual Activity   Alcohol use: No   Drug use: No   Sexual activity: Not on file  Other Topics Concern   Not on file  Social History Narrative   Not on file   Social Drivers of Health   Financial Resource Strain: Not on file  Food Insecurity: No Food Insecurity (09/28/2023)   Hunger Vital Sign    Worried About Running Out of Food in the Last Year: Never true    Ran Out of Food in the Last Year: Never true  Transportation Needs: No Transportation Needs (09/28/2023)   PRAPARE - Administrator, Civil Service (Medical): No    Lack of Transportation (Non-Medical): No  Physical Activity: Not on file  Stress: Not on file  Social Connections: Not on file   Family History  Problem Relation Age of Onset   Anxiety disorder Mother    Dementia Mother    Coronary artery disease Father    Anxiety disorder Brother    Diabetes Brother    Scheduled Meds:  belzutifan   120 mg Oral Daily   cabozantinib   60 mg Oral Daily   Chlorhexidine  Gluconate Cloth  6 each Topical Daily   heparin   injection (subcutaneous)  5,000 Units Subcutaneous Q8H   midodrine   10 mg Per Tube TID WC   pantoprazole   40 mg Oral Q0600   polyethylene glycol  17 g Oral Daily   senna-docusate  1 tablet Oral BID   sodium chloride  flush  10-40 mL Intracatheter Q12H   sodium chloride  flush  3 mL Intravenous Q12H   sodium zirconium cyclosilicate   10 g Per Tube Once   Continuous Infusions:  sodium chloride  10 mL/hr at 10/03/23 0800   ceFEPime (MAXIPIME) IV Stopped (10/02/23  1525)   chlorproMAZINE (THORAZINE) 25 mg in sodium chloride  0.9 % 25 mL IVPB     linezolid (ZYVOX) IV 600 mg (10/03/23 0913)   norepinephrine (LEVOPHED) Adult infusion Stopped (10/03/23 0345)   vasopressin 0.03 Units/min (10/03/23 0800)   PRN Meds:.chlorproMAZINE (THORAZINE) 25 mg in sodium chloride  0.9 % 25 mL IVPB, morphine  injection, ondansetron  **OR** ondansetron  (ZOFRAN ) IV, mouth rinse, sodium chloride  flush, sorbitol  Allergies  Allergen Reactions   Amlodipine  Other (See Comments)    Edema ( 5 mg)   Doxycycline  Other (See Comments)    Light headed , passed out   CBC:    Component Value Date/Time   WBC 8.5 10/03/2023 0355   HGB 10.0 (L) 10/03/2023 0355   HGB 12.3 (L) 09/22/2023 0803   HCT 30.6 (L) 10/03/2023 0355   PLT 194 10/03/2023 0355   PLT 269 09/22/2023 0803   MCV 91.6 10/03/2023 0355   NEUTROABS 8.4 (H) 10/02/2023 0451   LYMPHSABS 0.5 (L) 10/02/2023 0451   MONOABS 1.2 (H) 10/02/2023 0451   EOSABS 0.0 10/02/2023 0451   BASOSABS 0.0 10/02/2023 0451   Comprehensive Metabolic Panel:    Component Value Date/Time   NA 129 (L) 10/03/2023 0355   K 5.2 (H) 10/03/2023 0355   CL 96 (L) 10/03/2023 0355   CO2 16 (L) 10/03/2023 0355   BUN 56 (H) 10/03/2023 0355   CREATININE 6.79 (H) 10/03/2023 0355   CREATININE 1.37 (H) 09/22/2023 0803   GLUCOSE 124 (H) 10/03/2023 0355   CALCIUM 9.3 10/03/2023 0355   AST 486 (H) 10/03/2023 0355   AST 112 (H) 09/22/2023 0803   ALT 264 (H) 10/03/2023 0355   ALT 43 09/22/2023 0803   ALKPHOS 163 (H) 10/03/2023 0355   BILITOT 2.6 (H) 10/03/2023 0355   BILITOT 0.8 09/22/2023 0803   PROT 6.4 (L) 10/03/2023 0355   ALBUMIN  3.8 10/03/2023 0355   ALBUMIN  3.8 10/03/2023 0355    Physical Exam: Vital Signs: BP (!) 119/57 (BP Location: Left Arm)   Pulse (!) 122   Temp 98.6 F (37 C) (Oral)   Resp 14   Ht 6' 4 (1.93 m)   Wt 121.1 kg   SpO2 93%   BMI 32.50 kg/m  SpO2: SpO2: 93 % O2 Device: O2 Device: Room Air O2 Flow Rate: O2 Flow  Rate (L/min): 2 L/min Intake/output summary:  Intake/Output Summary (Last 24 hours) at 10/03/2023 0919 Last data filed at 10/03/2023 0800 Gross per 24 hour  Intake 1438.34 ml  Output 275 ml  Net 1163.34 ml   LBM: Last BM Date : 09/27/23 Baseline Weight: Weight: 112.1 kg Most recent weight: Weight: 121.1 kg  General: NAD, awake, ill-appearing, cachectic, frail Cardiovascular: Tachycardia noted Respiratory: no increased work of breathing noted, not in respiratory distress Neuro: Awake, hypophonic, appears very fatigued Psych: Appropriately tearful during discussion          Palliative Performance Scale: 10%  Additional Data Reviewed: Recent Labs    10/02/23 0451 10/03/23 0355  WBC 10.1 8.5  HGB 11.1* 10.0*  PLT 220 194  NA 129*  129* 129*  BUN 52*  51* 56*  CREATININE 6.87*  6.25* 6.79*    Imaging: DG CHEST PORT 1 VIEW EXAM: 1 VIEW(S) XRAY OF THE CHEST 10/01/2023 02:15:00 PM  COMPARISON: None available.  CLINICAL HISTORY: Shock (HCC) [id number]. PICC line placement.  FINDINGS:  LINES, TUBES AND DEVICES: Right upper extremity PICC in place with tip at superior cavoatrial junction.  LUNGS AND PLEURA: Low lung volumes. Bibasilar opacities which may reflect atelectasis or airspace disease. No pulmonary edema. No pleural effusion. No pneumothorax.  HEART AND MEDIASTINUM: No acute abnormality of the cardiac and mediastinal silhouettes.  BONES AND SOFT TISSUES: No acute osseous abnormality.  IMPRESSION: 1. Bibasilar opacities that may represent atelectasis or airspace disease, in the setting of low lung volumes. 2. Right upper extremity PICC with tip at the superior cavoatrial junction.  Electronically signed by: Waddell Calk MD 10/01/2023 02:26 PM EDT RP Workstation: [id number]  EKG SITE RITE If Site Rite image not attached, placement could not be confirmed due to  current cardiac rhythm.    I personally reviewed recent imaging.    Palliative Care Assessment and Plan Summary of Established Goals of Care and Medical Treatment Preferences   Patient is a 43 year old male with a past medical history of metastatic renal cell carcinoma status post right nephrectomy with metastatic disease to liver, peritoneum, and right rectus sheath, hypertension, PE, GERD, and nephrolithiasis who was admitted on 09/28/2023 for management of abdominal pain.  During hospitalization, patient has deteriorated requiring transfer to ICU for management of shock in the setting of multisystem organ failure, metabolic encephalopathy, acute hypoxic respiratory failure, acute renal failure, and worsening liver function.  Nephrology and oncology providing recommendations.  Palliative medicine team consulted to assist with complex medical decision making.  # Complex medical decision making/goals of care  - Extensive discussion with patient and wife as detailed above in HPI.  Discussed current medical situation and concern related to multisystem organ failure, particularly renal failure.  Patient not appropriate for hemodialysis.  Was able to introduce the appropriateness of comfort focused care if patient's medical status continues to worsen, though patient did not want to discuss specific details regarding this.  Updated later in day that oncologist has discussed with wife again and plan is to continue current appropriate medical management though if patient deteriorates, would be appropriate to initiate comfort focused measures and allow a natural death.  Palliative medicine team continue to follow along and to engage in conversations as able and appropriate.  -  Code Status: Limited: Do not attempt resuscitation (DNR) -DNR-LIMITED -Do Not Intubate/DNI    # Symptom management Patient is receiving these palliative interventions for symptom management with an intent to improve quality of life.   - Pain, severe acute on chronic pain in setting of metastatic renal  cell carcinoma   - Discontinue IV morphine  with renal failure   - Start IV fentanyl  25 mcg every 2 hours as needed.  Continue to adjust based on patient's symptom response  # Psycho-social/Spiritual Support:  - Support System: Wife  - Discussed care with chaplain for support  # Discharge Planning:  To Be Determined  Thank you for allowing the palliative care team to participate in the care [facility].  Tinnie Radar, DO Palliative Care Provider PMT # [phone]  If patient remains symptomatic despite maximum  doses, please call PMT at [phone] between 0700 and 1900. Outside of these hours, please call attending, as PMT does not have night coverage.  Billing based on MDM: High  Problems Addressed: One or more chronic illnesses with severe exacerbation, progression, or side effects of treatment.  Risks: Parenteral controlled substances

## 2023-10-03 NOTE — Progress Notes (Signed)
 SLP Cancellation Note  Patient Details Name: Marcus Burns MRN: [id number] DOB: 06/20/1980   Cancelled treatment:       Reason Eval/Treat Not Completed: Patient's level of consciousness Palliative following. Patient has been lethargic today. SLP plans to f/u next date for readiness to trial PO's.  Norleen IVAR Blase, MA, CCC-SLP Speech Therapy

## 2023-10-03 NOTE — Progress Notes (Signed)
 PHARMACY - ANTICOAGULATION CONSULT NOTE  Pharmacy Consult for heparin  Indication: IVC thrombus, hx pulmonary embolus  Allergies  Allergen Reactions   Amlodipine  Other (See Comments)    Edema ( 5 mg)   Doxycycline  Other (See Comments)    Light headed , passed out    Patient Measurements: Height: 6' 4 (193 cm) Weight: 121.1 kg (266 lb 15.6 oz) IBW/kg (Calculated) : 86.8 HEPARIN  DW (KG): 109.6  Vital Signs: Temp: 98.3 F (36.8 C) (09/29 1130) Temp Source: Oral (09/29 1130) BP: 110/67 (09/29 1130) Pulse Rate: 121 (09/29 1130)  Labs: Recent Labs    10/01/23 1011 10/01/23 1017 10/01/23 2012 10/02/23 0451 10/03/23 0355  HGB 11.9*  --   --  11.1* 10.0*  HCT 39.1  --   --  34.8* 30.6*  PLT 211  --   --  220 194  LABPROT 25.0*  --   --   --   --   INR 2.1*  --   --   --   --   CREATININE  --    < > 6.24* 6.87*  6.25* 6.79*   < > = values in this interval not displayed.    Estimated Creatinine Clearance: 19.7 mL/min (A) (by C-G formula based on SCr of 6.79 mg/dL (H)).   Medical History: Past Medical History:  Diagnosis Date   Acute prostatitis 06/19/2007   ALLERGIC RHINITIS 06/19/2007   ANXIETY 06/19/2007   Cancer (HCC)    kidney   ELEVATED BLOOD PRESSURE WITHOUT DIAGNOSIS OF HYPERTENSION 06/19/2007   GERD 06/19/2007   GERD (gastroesophageal reflux disease)    History of COVID-19 01/24/2019   History of kidney stones    Hypertension    Inguinal hernia    Migraines    NEPHROLITHIASIS, HX OF 06/19/2007   PE (pulmonary thromboembolism) (HCC)     Medications:  Eliquis  5 mg BID - last dose 9/27 ~ 1030 Subcu heparin  9/28 >> last dose 9/29 ~ 0500  Assessment: 43 year old male presented with abdominal pain. Patient with RCC s/p right nephrectomy and widely metastatic, worsening. INR elevated. AST/ALT elevated concerning for developing liver failure, SCr continues to worsen. Not a candidate for renal replacement therapy. He has required vasopressor support - off  norepinephrine currently, remains on vasopressin. Oncology following, on oral chemotherapy agents.  Pharmacy consulted to transition to heparin  infusion in setting of nearly occlusive thrombus within the intrahepatic IVC as well as history of PE.  Unable to take oral meds at this time. Cortrak attempted 9/28. Patient fatigued.  CBC slow trend downward. INR/aPTT elevated 9/27 in setting of liver disease. Given poor renal function, expect prolonged clearance of Eliquis .  Goal of Therapy:  Heparin  level 0.3-0.7 units/ml aPTT 66-102 seconds Monitor platelets by anticoagulation protocol: Yes   Plan:  -Start heparin  infusion at 1750 units/hr -Check 8 hour heparin  level and aPTT -Daily CBC -Continue to follow for plan of care   Stefano MARLA Bologna, PharmD, BCPS Clinical Pharmacist 10/03/2023 1:10 PM

## 2023-10-03 NOTE — Assessment & Plan Note (Signed)
 Pressors and IVF per ICU team Appreciate ICU team assistance

## 2023-10-03 NOTE — Progress Notes (Signed)
 Marcus Burns   DOB:06-15-1980   FM#:[id number]    ASSESSMENT & PLAN:   Marcus Burns is a 43 year old male patient with oncologic history significant for metastatic RCC with peritoneal carcinomatosis, liver and lung mets. Patient admitted on 09/28/2023 for uncontrolled abdominal pain.    Clinically worsening with progressive liver enzyme elevated, renal failure, hypotension, anuric and transferred to ICU on 9/27.     9/27. patient was placed on pressors.  Discussed with Marcus Burns, and his wife Alan clinically deteriorating.  Recommend DNR and DNI in the setting of continuing deterioration. Continue other reversible and supportive measures as able.  Appreciate ICU assistance.  CODE STATUS have been changed to DO NOT RESUSCITATE.   9/28.  Report of interactive overnight.  Mild improvement of LFT. 9/29.  Reported no urine output.  Patient worsening of renal function.  Mild improvement of LFT.   Discussed with nephrology, and wife, continue current supportive measures.  Discussed not to escalate care.  If patient continues to decline, transition to hospice, and comfort measures is reasonable.  Assessment & Plan ARF (acute renal failure) With profound liver metastases causing endorgan damage Initially hypotension, and possible contrast nephropathy followed by shock Continue IV fluid as able Appreciate nephrology and ICU management Hypotension Pressors and IVF per ICU team Appreciate ICU team assistance Metastatic renal cell carcinoma (HCC) Profound liver metastases with recent progression Continue belzutifan  120 mg daily and cabozantinib  60 mg daily as able Uncontrolled pain Appreciate palliative care consult Anemia Secondary to malignancy and medication Goals of care, counseling/discussion Ongoing discussion.  Agree with DNR/DNI  Code Status DNR and DNI  Goals of care Discussed today.  Recommend to escalate care, continue supportive measures currently.  If continues to decline,  hospice and comfort measures is reasonable.  All questions were answered.   Thank you for the consult. Will follow with you.  [name], MD 10/03/2023 3:47 PM  Subjective:  Report no urine output.  Creatinine about the same today.  LFT improved.  Wife at bedside.  Marcus Burns does not say much.  Objective:  Vitals:   10/03/23 1500 10/03/23 1515  BP: 125/65 116/60  Pulse: (!) 117 (!) 116  Resp: 13 15  Temp:    SpO2: 94% 92%     Intake/Output Summary (Last 24 hours) at 10/03/2023 1547 Last data filed at 10/03/2023 1200 Gross per 24 hour  Intake 1297.87 ml  Output 75 ml  Net 1222.87 ml    GENERAL: alert.  EYES:  sclera clear LUNGS: normal breathing effort.  HEART: Tachycardic ABDOMEN: abdomen firm, tender in right upper quadrant and distended Musculoskeletal: Bilateral lower extremity edema NEURO: Eyes open on command.    Labs:  Recent Labs    10/01/23 1011 10/01/23 1017 10/01/23 2012 10/02/23 0451 10/03/23 0355  NA  --  132* 127* 129*  129* 129*  K  --  5.5* 5.3* 5.3*  5.3* 5.2*  CL  --  99 97* 97*  97* 96*  CO2  --  18* 15* 16*  16* 16*  GLUCOSE  --  91 143* 136*  135* 124*  BUN  --  48* 58* 52*  51* 56*  CREATININE  --  6.14* 6.24* 6.87*  6.25* 6.79*  CALCIUM  --  8.6* 8.6* 9.0  9.1 9.3  GFRNONAA  --  11* 11* 9*  11* 10*  PROT 6.5  --   --  6.4* 6.4*  ALBUMIN  3.7 3.6  --  3.7  3.7 3.8  3.8  AST 1,042*  --   --  680* 486*  ALT 380*  --   --  314* 264*  ALKPHOS 183*  --   --  177* 163*  BILITOT 1.8*  --   --  2.1* 2.6*  BILIDIR 1.2*  --   --   --  1.7*  IBILI 0.6  --   --   --  0.9    Studies:  DG CHEST PORT 1 VIEW Result Date: 10/01/2023 EXAM: 1 VIEW(S) XRAY OF THE CHEST 10/01/2023 02:15:00 PM COMPARISON: None available. CLINICAL HISTORY: Shock (HCC) [id number]. PICC line placement. FINDINGS: LINES, TUBES AND DEVICES: Right upper extremity PICC in place with tip at superior cavoatrial junction. LUNGS AND PLEURA: Low lung volumes. Bibasilar  opacities which may reflect atelectasis or airspace disease. No pulmonary edema. No pleural effusion. No pneumothorax. HEART AND MEDIASTINUM: No acute abnormality of the cardiac and mediastinal silhouettes. BONES AND SOFT TISSUES: No acute osseous abnormality. IMPRESSION: 1. Bibasilar opacities that may represent atelectasis or airspace disease, in the setting of low lung volumes. 2. Right upper extremity PICC with tip at the superior cavoatrial junction. Electronically signed by: Waddell Calk MD 10/01/2023 02:26 PM EDT RP Workstation: [id number]   US  EKG SITE RITE Result Date: 10/01/2023 If Site Rite image not attached, placement could not be confirmed due to current cardiac rhythm.  US  RENAL Result Date: 09/30/2023 CLINICAL DATA:  Acute kidney injury EXAM: RENAL / URINARY TRACT ULTRASOUND COMPLETE COMPARISON:  CT abdomen and pelvis 09/27/2023 FINDINGS: Right Kidney: Right kidney is surgically absent. No mass demonstrated in the visualized right renal fossa. Numerous cystic lesions are demonstrated in the visualized liver. See prior CT report. Left Kidney: Renal measurements: 9.1 x 4.8 x 5.8 cm = volume: 132 mL. Diffusely increased parenchymal echotexture. Grossly normal parenchymal thickness. No hydronephrosis. Bladder: Bladder is not visualized due to overlying bowel gas. Other: Technically limited examination due to patient's body habitus and bowel gas. IMPRESSION: 1. Right kidney is surgically absent. Visualized left kidney demonstrates increased parenchymal echotexture suggesting chronic medical renal disease. No hydronephrosis. Electronically Signed   By: Elsie Gravely M.D.   On: 09/30/2023 20:17   CT ABDOMEN PELVIS W CONTRAST Result Date: 09/27/2023 CLINICAL DATA:  Abdominal pain, acute, nonlocalized EXAM: CT ABDOMEN AND PELVIS WITH CONTRAST TECHNIQUE: Multidetector CT imaging of the abdomen and pelvis was performed using the standard protocol following bolus administration of intravenous  contrast. RADIATION DOSE REDUCTION: This exam was performed according to the departmental dose-optimization program which includes automated exposure control, adjustment of the mA and/or kV according to patient size and/or use of iterative reconstruction technique. CONTRAST:  80mL OMNIPAQUE  IOHEXOL  300 MG/ML  SOLN COMPARISON:  09/16/2023 FINDINGS: Lower chest: Trace right pleural effusion with right basilar atelectasis.Unchanged 2 mm left lower lobe nodule abutting the hemidiaphragm (axial 42). Hepatobiliary: Enlarged liver, which is full of innumerable hypodense masses, without significant interval change.For example, the largest mass in the central right hepatic lobe measures 5.7 x 8.4 cm (axial 38), previously measuring 5.2 x 6.6 cm.The gallbladder was not well visualized or evaluated. No intrahepatic or extrahepatic biliary ductal dilation. The portal veins are patent. Pancreas: No mass or main ductal dilation. No peripancreatic inflammation or fluid collection. Spleen: Normal size. No mass. Adrenals/Urinary Tract: No adrenal masses. The right kidney is surgically absent. No nephrolithiasis or hydronephrosis. The urinary bladder is completely decompressed. Stomach/Bowel: The stomach is decompressed without focal abnormality. No small bowel wall thickening or inflammation. No small bowel obstruction.Normal appendix. Vascular/Lymphatic: No  aortic aneurysm. No intraabdominal or pelvic lymphadenopathy. Nearly occlusive thrombus again noted within the intrahepatic IVC (axial 45). Reproductive: No prostatomegaly.Small volume free fluid in the pelvis. Other: Peritoneal nodule along the ventral abdominal wall measures 2.2 x 4.1 cm (previously 1.7 x 3.3 cm), axial 64. multiple additional small areas of nodularity in the epigastric fat adjacent to the ventral abdominal wall (axial 55), more prominent than on the prior study. No pneumoperitoneum. Peritoneal thickening in the right paracolic gutter is also noted (axial 79).  Mixed gas and fluid collection in the hepatorenal fossa is otherwise unchanged. Musculoskeletal: No acute fracture or destructive lesion. Moderate anasarca. Intramuscular metastatic disease in the inferior right rectus sheath measuring 1.4 cm (axial 76). IMPRESSION: 1. No acute intra-abdominal or pelvic abnormality. 2. Extensive intrahepatic metastases again noted filling the entire hepatic parenchyma, with slight enlargement of the index mass in the central right hepatic lobe, measuring 5.7 x 8.4 cm (previously, 5.2 x 6.6 cm, axial 38). 3. Worsening of the peritoneal metastatic disease. For example, the peritoneal nodule subjacent to the midline ventral incision (axial 64), measures 2.2 x 4.1 cm (previously, 1.7 x 3.3 cm). Trace malignant ascites in the pelvis. 4. Intramuscular metastatic disease along the inferior right rectus sheath measuring 1.4 cm (axial 76). 5. Similar appearance of the nearly occlusive thrombus within the intrahepatic IVC. Electronically Signed   By: Rogelia Myers M.D.   On: 09/27/2023 12:17   CT CHEST ABDOMEN PELVIS W CONTRAST Result Date: 09/19/2023 CLINICAL DATA:  RCC with liver Mets, clear cell carcinoma of right kidney. * Tracking Code: BO * EXAM: CT CHEST, ABDOMEN, AND PELVIS WITH CONTRAST TECHNIQUE: Multidetector CT imaging of the chest, abdomen and pelvis was performed following the standard protocol during bolus administration of intravenous contrast. RADIATION DOSE REDUCTION: This exam was performed according to the departmental dose-optimization program which includes automated exposure control, adjustment of the mA and/or kV according to patient size and/or use of iterative reconstruction technique. CONTRAST:  OMNIPAQUE  IOHEXOL  300 MG/ML  SOLN COMPARISON:  Multiple priors including CT February 02, 2023 FINDINGS: CT CHEST FINDINGS Cardiovascular: Normal caliber thoracic aorta. Normal size heart. No significant pericardial effusion/thickening. Mediastinum/Nodes: No  suspicious thyroid  nodule. No pathologically enlarged mediastinal, hilar or axillary lymph nodes. Lungs/Pleura: Left lower lobe pulmonary nodule measures 1-2 mm on image 111/4 previously 2 mm. No new suspicious pulmonary nodules or masses. Right lower lobe atelectasis versus scarring. Musculoskeletal: No aggressive lytic or blastic lesion of bone. CT ABDOMEN PELVIS FINDINGS Hepatobiliary: Innumerable bilobar hepatic lesions are progressed from prior examination. For reference: -indexed lesion in the dome of the liver measures 5.2 x 2.9 cm on image 43/2 previously 2.3 x 2.2 cm. Fluid collection in Morison's pouch described on prior examination not confidently seen on today's study. Cholelithiasis.  No biliary ductal dilation. Pancreas: No pancreatic ductal dilation or evidence of acute inflammation. Spleen: No splenomegaly. Adrenals/Urinary Tract: Right adrenal gland and kidney are surgically absent Left adrenal gland and kidney are unremarkable. Urinary bladder is minimally distended. Stomach/Bowel: Stomach is unremarkable for degree of distension. No pathologic dilation of small or large bowel. No evidence of acute bowel inflammation. Vascular/Lymphatic: Mixed attenuating nodularity in the intrahepatic IVC on image 80/2 is similar prior suspicious for thrombus. No pathologically enlarged abdominal or pelvic lymph nodes identified. Reproductive: Prostate gland is visualized. Other: Overall decreased omental caking with trace abdominopelvic free fluid. Nodular focus in the anterior abdomen subjacent to the incision line measures 3.3 x 1.7 cm on image 87/2 previously 4.1 x  3.0 cm. Musculoskeletal: No aggressive lytic or blastic lesion of bone. IMPRESSION: 1. Innumerable bilobar hepatic lesions are progressed from prior examination, consistent with worsening hepatic metastatic disease. 2. Overall decreased omental caking with trace abdominopelvic free fluid. 3. Mixed attenuating nodularity in the intrahepatic IVC is  similar prior suspicious for thrombus. 4. Left lower lobe pulmonary nodule measures 1-2 mm previously 2 mm. No new suspicious pulmonary nodules or masses. 5. Cholelithiasis. Electronically Signed   By: Reyes Holder M.D.   On: 09/19/2023 16:33

## 2023-10-03 NOTE — Assessment & Plan Note (Signed)
 Ongoing discussion.  Agree with DNR/DNI

## 2023-10-03 NOTE — Assessment & Plan Note (Signed)
 Profound liver metastases with recent progression Continue belzutifan  120 mg daily and cabozantinib  60 mg daily as able

## 2023-10-03 NOTE — Assessment & Plan Note (Signed)
 With profound liver metastases causing endorgan damage Initially hypotension, and possible contrast nephropathy followed by shock Continue IV fluid as able Appreciate nephrology and ICU management

## 2023-10-03 NOTE — Progress Notes (Signed)
 NAME:  Marcus Burns, MRN:  [id number], DOB:  03/26/1980, LOS: 4 ADMISSION DATE:  09/28/2023, CONSULTATION DATE: 10/01/2023 REFERRING MD:  TRH, CHIEF COMPLAINT: Abdominal pain   History of Present Illness:  43 year old man who presented to [facility] 9/23 with abdominal pain. PMHx significant for HTN, PE, GERD, nephrolithiasis, RCC (s/p right nephrectomy, widely metastatic to liver, peritoneum, R rectus sheath). Transferred to the ICU with shock, liver failure, renal failure, encephalopathy, appears to be actively dying.  Admitted to [facility] 9/23 with abdominal pain. CT A/P did not reveal any surgical emergency; demonstrated worsening peritoneal carcinomatosis, worsening liver metastasis, rectus sheath metastasis. Noted to have progressive renal failure. Developed hypotension and encephalopathy transferred to the ICU. Documentation 9/27 via Oncologist demonstrates he agreed to DNR. Currently at the time of my assessment later in the day he is unable to participate in any conversation, does not have capacity. There is no clear source of infection. He is oliguric. INR elevated. LFTs rising. Creatinine rising. Multisystem organ failure. Appears to be actively dying.  PCCM consulted for ICU management.  Pertinent Medical History:   Past Medical History:  Diagnosis Date   Acute prostatitis 06/19/2007   ALLERGIC RHINITIS 06/19/2007   ANXIETY 06/19/2007   Cancer (HCC)    kidney   ELEVATED BLOOD PRESSURE WITHOUT DIAGNOSIS OF HYPERTENSION 06/19/2007   GERD 06/19/2007   GERD (gastroesophageal reflux disease)    History of COVID-19 01/24/2019   History of kidney stones    Hypertension    Inguinal hernia    Migraines    NEPHROLITHIASIS, HX OF 06/19/2007   PE (pulmonary thromboembolism) (HCC)    Significant Hospital Events: Including procedures, antibiotic start and stop dates in addition to other pertinent events   9/24 - Admitted to [facility] with abdominal pain. CT A/P without acute findings, +worsening  peritoneal carcinomatosis, worsening liver metastasis, rectus sheath metastasis, nearly occlusive IVC clot. 9/27 - Hypotensive requiring vasopressor initiation. Transferred to ICU. MOSF with worsening renal failure.  Interim History / Subjective:  No significant events overnight Restless, but not as much as prior per patient's wife (at bedside) Marcus Burns is slow to respond, appears uncomfortable, fatigued Tachy to 110s Remains on vaso 0.03 Awaiting SLP evaluation, unable to get Cortrak 9/28  Objective:    Blood pressure (!) 114/50, pulse (!) 115, temperature 98.7 F (37.1 C), temperature source Axillary, resp. rate 16, height 6' 4 (1.93 m), weight 121.1 kg, SpO2 90%.        Intake/Output Summary (Last 24 hours) at 10/03/2023 0815 Last data filed at 10/03/2023 9487 Gross per 24 hour  Intake 1398.36 ml  Output 275 ml  Net 1123.36 ml   Filed Weights   10/01/23 0908 10/02/23 0500 10/03/23 0412  Weight: 118.3 kg 118.6 kg 121.1 kg   Physical Examination: General: Acutely ill-appearing middle-aged man in NAD, appears uncomfortable. HEENT: [place]/AT, anicteric sclera, PERRL, dry mucous membranes. Neuro: Awake, lethargic/slow to respond. Appears frustrated. Responds to verbal stimuli. Following commands consistently, just slow to respond. Moves all 4 extremities spontaneously. Generalized weakness.  CV: Tachycardic to 110s, regular rhythm, no m/g/r. PULM: Breathing even and minimally labored on RA. Lung fields diminished at bilateral bases. GI: Soft, mildly distended, exquisitely TTP over RUQ/RLQ. Hypoactive bowel sounds. Extremities: Bilateral symmetric 2+ LE edema noted. Skin: Warm/dry, no rashes.  Resolved Problem List:    Assessment and Plan:   Shock in the setting of MOSF Hypotensive escalating vasopressor requirement. No clear source of infection. - Goal MAP > 65 - Fluid resuscitation as  tolerated, caution in the setting of oliguria/renal failure - Vasopressin to help maintain  goal MAP, off of NE at present - Begin midodrine  once enteral access established - Trend WBC, fever curve - F/u Cx data, BCx NGTD, MRSA PCR negative - Continue broad-spectrum antibiotics (cefepime/linezolid, de-escalate as able)  Metabolic encephalopathy In multisystem organ failure, fear he is actively dying in the setting of widely metastatic cancer and renal failure. - Correct metabolic derangements as able - Multimodal pain control; have been limiting opioid/centrally acting medications to aid in clearance of encephalopathy, however pending further GOC would add back low-dose opiate medications and prioritize pain control - Limit interruptions to normal sleep/wake cycle as able  Acute hypoxemic respiratory failure: Likely related to hypoventilation in setting encephalopathy.  Atelectasis seen on CXR so suspect element of shunt. Possibly worsening/developing fluid overload with renal failure. - Supplemental O2 support for SpO2 > 90% - Not requiring O2 at present - Pulmonary hygiene - Intermittent CXR  H/o of pulm embolism Near-occlusive IVC thrombus - Holding Eliquis  at present due to elevated INR, liver/renal dysfunction - SQH for DVT ppx  Acute renal failure on CKD 3A, possibly contrast-induced nephropathy versus progressive renal failure and widely metastatic renal cancer. Hyperkalemia in the setting of renal failure. Can try medication shifting etc. but this is futile and progressive renal failure not a candidate for dialysis. - Trend BMP - Replete electrolytes as indicated - Shifting K as able, Lokelma  once enteral access established - Monitor I&Os - F/u urine studies - Avoid nephrotoxic agents as able - Ensure adequate renal perfusion with vasopressors as above - Appreciate Nephro input, unfortunately he is not a dialysis candidate  Widely metastatic renal cell carcinoma with metastasis to liver lung and peritoneal carcinomatosis: He appears to be actively dying.  As such,  aggressive interventions, resuscitative efforts are futile.  Appreciate oncologist assistance in GOC discussions. See IPAL 9/27. - Oncology following - Continuing belzutifan , cabozantinib  per wife's request - PMT consulted, appreciate assistance - DNR/DNI at present  Abdominal pain: Presenting complaint.  Related to worsening metastatic disease worsening peritoneal carcinomatosis as well as worsening hepatic metastases. - Multimodal pain management as above, maximizing adjuncts as able, may need to add back opioid medications for patient's comfort; unfortunately in a difficult spot due to hypotension/MOSF  Elevated LFTs and liver failure Hyperbilirubinemia Progressively worse in setting of hypotension.  With underlying progressive metastases, I suspect he is developing shock liver and in the process of likely converting to complete fulminant liver failure.  Encephalopathy, elevated INR, and elevated LFTs qualify for the diagnosis of liver failure. - Trend LFTs - Avoid hepatotoxic agents as able  Critical care time:    The patient is critically ill with multiple organ system failure and requires high complexity decision making for assessment and support, frequent evaluation and titration of therapies, advanced monitoring, review of radiographic studies and interpretation of complex data.   Critical Care Time devoted to patient care services, exclusive of separately billable procedures, described in this note is 34 minutes.  Corean CHRISTELLA Rori Goar, PA-C [facility] 10/03/23 8:16 AM  Please see [email or web address] for pager details.  From 7A-7P if no response, please call [phone] After hours, please call ELink [phone]

## 2023-10-03 NOTE — Progress Notes (Signed)
 PHARMACY - ANTICOAGULATION CONSULT NOTE  Pharmacy Consult for heparin  Indication: IVC thrombus, hx pulmonary embolus  Allergies  Allergen Reactions   Amlodipine  Other (See Comments)    Edema ( 5 mg)   Doxycycline  Other (See Comments)    Light headed , passed out    Patient Measurements: Height: 6' 4 (193 cm) Weight: 121.1 kg (266 lb 15.6 oz) IBW/kg (Calculated) : 86.8 HEPARIN  DW (KG): 109.6  Vital Signs: Temp: 97.8 F (36.6 C) (09/29 1910) Temp Source: Oral (09/29 1910) BP: 90/48 (09/29 1845) Pulse Rate: 121 (09/29 1845)  Labs: Recent Labs    10/01/23 1011 10/01/23 1017 10/01/23 2012 10/02/23 0451 10/03/23 0355 10/03/23 2107  HGB 11.9*  --   --  11.1* 10.0*  --   HCT 39.1  --   --  34.8* 30.6*  --   PLT 211  --   --  220 194  --   APTT  --   --   --   --   --  98*  LABPROT 25.0*  --   --   --   --   --   INR 2.1*  --   --   --   --   --   HEPARINUNFRC  --   --   --   --   --  >1.10*  CREATININE  --    < > 6.24* 6.87*  6.25* 6.79*  --    < > = values in this interval not displayed.    Estimated Creatinine Clearance: 19.7 mL/min (A) (by C-G formula based on SCr of 6.79 mg/dL (H)).   Medical History: Past Medical History:  Diagnosis Date   Acute prostatitis 06/19/2007   ALLERGIC RHINITIS 06/19/2007   ANXIETY 06/19/2007   Cancer (HCC)    kidney   ELEVATED BLOOD PRESSURE WITHOUT DIAGNOSIS OF HYPERTENSION 06/19/2007   GERD 06/19/2007   GERD (gastroesophageal reflux disease)    History of COVID-19 01/24/2019   History of kidney stones    Hypertension    Inguinal hernia    Migraines    NEPHROLITHIASIS, HX OF 06/19/2007   PE (pulmonary thromboembolism) (HCC)     Medications:  Eliquis  5 mg BID - last dose 9/27 ~ 1030 Subcu heparin  9/28 >> last dose 9/29 ~ 0500  Assessment: 43 year old male presented with abdominal pain. Patient with RCC s/p right nephrectomy and widely metastatic, worsening. INR elevated. AST/ALT elevated concerning for developing  liver failure, SCr continues to worsen. Not a candidate for renal replacement therapy. He has required vasopressor support - off norepinephrine currently, remains on vasopressin. Oncology following, on oral chemotherapy agents.  Pharmacy consulted to transition to heparin  infusion in setting of nearly occlusive thrombus within the intrahepatic IVC as well as history of PE.  Unable to take oral meds at this time. Cortrak attempted 9/28. Patient fatigued.  CBC slow trend downward. INR/aPTT elevated 9/27 in setting of liver disease. Given poor renal function, expect prolonged clearance of Eliquis .  10/03/2023 Heparin  level > 1.1 on heparin  1750 units/hr - level elevated due to recent Eliquis  use aPTT = 98 sec on heparin  at 1750 units/hr - within goal of 66 - 102 seconds No infusion issues or bleeding per RN report  Goal of Therapy:  Heparin  level 0.3-0.7 units/ml aPTT 66-102 seconds Monitor platelets by anticoagulation protocol: Yes   Plan:  - continue heparin  infusion at 1750 units/hr  - Daily heparin  level and aPTT and CBC -Continue to follow for plan of care  Rosaline IVAR Edison, Pharm.D Use secure chat for questions 10/03/2023 10:06 PM

## 2023-10-03 NOTE — Progress Notes (Signed)
 I met some of Marcus Burns's family members in the waiting area.  I introduced spiritual care services and assessed for needs.  They stated that for this moment, they are doing okay.  We will continue to follow this case and check in with patient and family, but please also page as needs arise.

## 2023-10-03 NOTE — Plan of Care (Signed)
  Problem: Clinical Measurements: Goal: Respiratory complications will improve Outcome: Progressing Goal: Cardiovascular complication will be avoided Outcome: Progressing   Problem: Elimination: Goal: Will not experience complications related to urinary retention Outcome: Progressing   Problem: Education: Goal: Knowledge of General Education information will improve Description: Including pain rating scale, medication(s)/side effects and non-pharmacologic comfort measures Outcome: Not Progressing   Problem: Activity: Goal: Risk for activity intolerance will decrease Outcome: Not Progressing   Problem: Nutrition: Goal: Adequate nutrition will be maintained Outcome: Not Progressing   Problem: Elimination: Goal: Will not experience complications related to bowel motility Outcome: Not Progressing   Problem: Pain Managment: Goal: General experience of comfort will improve and/or be controlled Outcome: Not Progressing

## 2023-10-03 NOTE — Progress Notes (Addendum)
 Patient ID: Marcus Burns, male   DOB: 1980-02-16, 43 y.o.   MRN: [id number] [facility] Progress Note   Assessment/ Plan:   1.  Acute kidney injury on chronic kidney disease stage IIIa: Appears to be from a combination of volume contraction, shock and contrast-induced nephropathy based on initial labs/sequence of events.  No evidence of obstruction to solitary left kidney on imaging.  Unfortunately remains anuric with dense acute kidney injury that is likely to persist with his hemodynamic instability.  Have d/w pt's wife, not sure if he will recover renal function or not. She didn't have any questions. Pt is very ill from his metastatic cancer and is a poor candidate for RRT at this time. Have d/w CCM and oncology. Will follow.  2.  Hyponatremia: Secondary to acute kidney injury and impaired free water  handling +/- nonosmotic ADH release with pain.  Unsuccessful diuretic challenge earlier, hypotension now prohibitive to reattempt. CXR no pulm edema, bilat LE edema likely due to low alb and/or venous/ lymphatic obstruction related to peritoneal carcinomatosis and/or inferior IVC thrombus.  3.  Hyperkalemia: Previously treated with Lokelma  however potassium level rising again and has been treated with rectal Kayexalate overnight as well as Lokelma . 4.  Metastatic renal cell carcinoma: Management per oncology  Subjective:   Seen in ICU Pt is drowsy, no questions D/w wife who is at bedside    Objective:   BP (!) 119/57 (BP Location: Left Arm)   Pulse (!) 122   Temp 98.6 F (37 C) (Oral)   Resp 14   Ht 6' 4 (1.93 m)   Wt 121.1 kg   SpO2 93%   BMI 32.50 kg/m   Intake/Output Summary (Last 24 hours) at 10/03/2023 1008 Last data filed at 10/03/2023 0800 Gross per 24 hour  Intake 1438.34 ml  Output 275 ml  Net 1163.34 ml   Weight change: 2.8 kg  Physical Exam: Gen: Lethargic, cachectic CVS: Pulse regular tachycardia, S1 and S2 normal, no MRG Resp: dec'd breath sounds  over bases, poor insp effort, no rales/rhonchi Abd: Soft, obese/protuberant, mild tenderness over lower quadrants Ext: bilat LE 2+ edema edema  Imaging: DG CHEST PORT 1 VIEW Result Date: 10/01/2023 EXAM: 1 VIEW(S) XRAY OF THE CHEST 10/01/2023 02:15:00 PM COMPARISON: None available. CLINICAL HISTORY: Shock (HCC) [id number]. PICC line placement. FINDINGS: LINES, TUBES AND DEVICES: Right upper extremity PICC in place with tip at superior cavoatrial junction. LUNGS AND PLEURA: Low lung volumes. Bibasilar opacities which may reflect atelectasis or airspace disease. No pulmonary edema. No pleural effusion. No pneumothorax. HEART AND MEDIASTINUM: No acute abnormality of the cardiac and mediastinal silhouettes. BONES AND SOFT TISSUES: No acute osseous abnormality. IMPRESSION: 1. Bibasilar opacities that may represent atelectasis or airspace disease, in the setting of low lung volumes. 2. Right upper extremity PICC with tip at the superior cavoatrial junction. Electronically signed by: Waddell Calk MD 10/01/2023 02:26 PM EDT RP Workstation: [id number]    Labs: BMET Recent Labs  Lab 09/28/23 1032 09/29/23 0625 09/30/23 0534 10/01/23 1017 10/01/23 2012 10/02/23 0451 10/03/23 0355  NA 136 133* 130* 132* 127* 129*  129* 129*  K 4.8 4.5 5.5* 5.5* 5.3* 5.3*  5.3* 5.2*  CL 99 98 100 99 97* 97*  97* 96*  CO2 19* 19* 17* 18* 15* 16*  16* 16*  GLUCOSE 146* 121* 92 91 143* 136*  135* 124*  BUN 26* 33* 44* 48* 58* 52*  51* 56*  CREATININE 2.64* 3.86* 4.89* 6.14* 6.24* 6.87*  6.25* 6.79*  CALCIUM 9.1 8.8* 8.3* 8.6* 8.6* 9.0  9.1 9.3  PHOS  --   --   --  8.0*  --  8.0* 7.9*   CBC Recent Labs  Lab 09/29/23 0625 09/30/23 0534 10/01/23 0000 10/01/23 1011 10/02/23 0451 10/03/23 0355  WBC 10.3 10.9* 12.6* 10.8* 10.1 8.5  NEUTROABS 8.2* 8.6*  --  9.1* 8.4*  --   HGB 14.3 13.3 12.2* 11.9* 11.1* 10.0*  HCT 47.4 42.4 38.2* 39.1 34.8* 30.6*  MCV 99.6 99.5 98.2 97.8 95.3 91.6  PLT 342 272 221 211 220  194    Medications:     belzutifan   120 mg Oral Daily   cabozantinib   60 mg Oral Daily   Chlorhexidine  Gluconate Cloth  6 each Topical Daily   heparin  injection (subcutaneous)  5,000 Units Subcutaneous Q8H   midodrine   10 mg Per Tube TID WC   pantoprazole   40 mg Oral Q0600   polyethylene glycol  17 g Oral Daily   senna-docusate  1 tablet Oral BID   sodium chloride  flush  10-40 mL Intracatheter Q12H   sodium chloride  flush  3 mL Intravenous Q12H   sodium zirconium cyclosilicate   10 g Per Tube Once    Myer Fret  MD  [facility] 10/03/2023, 1:28 PM

## 2023-10-03 NOTE — Plan of Care (Signed)
  Problem: Pain Managment: Goal: General experience of comfort will improve and/or be controlled Outcome: Progressing   Problem: Health Behavior/Discharge Planning: Goal: Ability to manage health-related needs will improve Outcome: Not Progressing   Problem: Clinical Measurements: Goal: Ability to maintain clinical measurements within normal limits will improve Outcome: Not Progressing Goal: Will remain free from infection Outcome: Not Progressing   Problem: Coping: Goal: Level of anxiety will decrease Outcome: Not Progressing

## 2023-10-03 NOTE — Assessment & Plan Note (Signed)
 Appreciate palliative care consult

## 2023-10-03 NOTE — Progress Notes (Signed)
 eLink Physician-Brief Progress Note Patient Name: MANCIL PFENNING DOB: 1980/04/25 MRN: [id number]   Date of Service  10/03/2023  HPI/Events of Note  Received request for AM labs   eICU Interventions  Reviewed previous labs and abnormalities noted Reasonable to repeat AM CBC     Intervention Category Minor Interventions: Clinical assessment - ordering diagnostic tests  Damien ONEIDA Grout 10/03/2023, 1:36 AM

## 2023-10-04 ENCOUNTER — Inpatient Hospital Stay (HOSPITAL_COMMUNITY)

## 2023-10-04 DIAGNOSIS — Z7189 Other specified counseling: Secondary | ICD-10-CM | POA: Diagnosis not present

## 2023-10-04 DIAGNOSIS — G893 Neoplasm related pain (acute) (chronic): Secondary | ICD-10-CM | POA: Diagnosis not present

## 2023-10-04 DIAGNOSIS — C649 Malignant neoplasm of unspecified kidney, except renal pelvis: Secondary | ICD-10-CM | POA: Diagnosis not present

## 2023-10-04 DIAGNOSIS — N179 Acute kidney failure, unspecified: Secondary | ICD-10-CM | POA: Diagnosis not present

## 2023-10-04 LAB — CBC
HCT: 29.6 % — ABNORMAL LOW (ref 39.0–52.0)
Hemoglobin: 9.8 g/dL — ABNORMAL LOW (ref 13.0–17.0)
MCH: 29.9 pg (ref 26.0–34.0)
MCHC: 33.1 g/dL (ref 30.0–36.0)
MCV: 90.2 fL (ref 80.0–100.0)
Platelets: 247 K/uL (ref 150–400)
RBC: 3.28 MIL/uL — ABNORMAL LOW (ref 4.22–5.81)
RDW: 14.7 % (ref 11.5–15.5)
WBC: 11.8 K/uL — ABNORMAL HIGH (ref 4.0–10.5)
nRBC: 0 % (ref 0.0–0.2)

## 2023-10-04 LAB — RENAL FUNCTION PANEL
Albumin: 3.4 g/dL — ABNORMAL LOW (ref 3.5–5.0)
Anion gap: 19 — ABNORMAL HIGH (ref 5–15)
BUN: 73 mg/dL — ABNORMAL HIGH (ref 6–20)
CO2: 14 mmol/L — ABNORMAL LOW (ref 22–32)
Calcium: 8.8 mg/dL — ABNORMAL LOW (ref 8.9–10.3)
Chloride: 101 mmol/L (ref 98–111)
Creatinine, Ser: 7.33 mg/dL — ABNORMAL HIGH (ref 0.61–1.24)
GFR, Estimated: 9 mL/min — ABNORMAL LOW (ref >60)
Glucose, Bld: 106 mg/dL — ABNORMAL HIGH (ref 70–99)
Phosphorus: 7.5 mg/dL — ABNORMAL HIGH (ref 2.5–4.6)
Potassium: 5.1 mmol/L (ref 3.5–5.1)
Sodium: 133 mmol/L — ABNORMAL LOW (ref 135–145)

## 2023-10-04 LAB — MAGNESIUM: Magnesium: 2 mg/dL (ref 1.7–2.4)

## 2023-10-04 LAB — APTT: aPTT: 81 s — ABNORMAL HIGH (ref 24–36)

## 2023-10-04 LAB — HEPARIN LEVEL (UNFRACTIONATED): Heparin Unfractionated: >1.1 [IU]/mL — ABNORMAL HIGH (ref 0.30–0.70)

## 2023-10-04 MED ORDER — POLYVINYL ALCOHOL 1.4 % OP SOLN: 1.0000 [drp] | Freq: Four times a day (QID) | OPHTHALMIC | Status: DC | PRN

## 2023-10-04 MED ORDER — GLYCOPYRROLATE 1 MG PO TABS: 1.0000 mg | ORAL_TABLET | ORAL | Status: DC | PRN

## 2023-10-04 MED ORDER — GLYCOPYRROLATE 0.2 MG/ML IJ SOLN: 0.2000 mg | INTRAMUSCULAR | Status: DC | PRN

## 2023-10-04 MED ORDER — HALOPERIDOL LACTATE 5 MG/ML IJ SOLN: 2.5000 mg | INTRAMUSCULAR | Status: DC | PRN

## 2023-10-04 MED ORDER — ACETAMINOPHEN 650 MG RE SUPP: 650.0000 mg | Freq: Four times a day (QID) | RECTAL | Status: DC | PRN

## 2023-10-04 MED ORDER — LORAZEPAM 2 MG/ML IJ SOLN
1.0000 mg | INTRAMUSCULAR | Status: DC | PRN
  Administered 2023-10-04 (×2): 1 mg via INTRAVENOUS
  Filled 2023-10-04 (×2): qty 1

## 2023-10-04 MED ORDER — SODIUM CHLORIDE 0.9 % IV SOLN: INTRAVENOUS | Status: DC

## 2023-10-04 MED ORDER — SODIUM BICARBONATE 650 MG PO TABS
650.0000 mg | ORAL_TABLET | Freq: Three times a day (TID) | ORAL | Status: DC
  Filled 2023-10-04: qty 1

## 2023-10-04 MED ORDER — MIDAZOLAM HCL 2 MG/2ML IJ SOLN: 2.0000 mg | INTRAMUSCULAR | Status: DC | PRN

## 2023-10-04 MED ORDER — ACETAMINOPHEN 325 MG PO TABS: 650.0000 mg | ORAL_TABLET | Freq: Four times a day (QID) | ORAL | Status: DC | PRN

## 2023-10-04 MED ORDER — FENTANYL BOLUS VIA INFUSION
100.0000 ug | INTRAVENOUS | Status: DC | PRN
  Administered 2023-10-04 – 2023-10-05 (×4): 100 ug via INTRAVENOUS

## 2023-10-04 MED ORDER — FENTANYL 2500MCG IN NS 250ML (10MCG/ML) PREMIX INFUSION
0.0000 ug/h | INTRAVENOUS | Status: DC
  Administered 2023-10-04 (×2): 50 ug/h via INTRAVENOUS
  Filled 2023-10-04: qty 250

## 2023-10-04 MED ORDER — GLYCOPYRROLATE 0.2 MG/ML IJ SOLN
0.2000 mg | INTRAMUSCULAR | Status: DC | PRN
  Administered 2023-10-05: 0.2 mg via INTRAVENOUS
  Filled 2023-10-04: qty 1

## 2023-10-04 MED ORDER — FENTANYL CITRATE PF 50 MCG/ML IJ SOSY
25.0000 ug | PREFILLED_SYRINGE | INTRAMUSCULAR | Status: DC | PRN
  Administered 2023-10-04: 25 ug via INTRAVENOUS

## 2023-10-04 NOTE — Assessment & Plan Note (Signed)
 Profound liver metastases with recent progression Transitioning to comfort measures Appreciate palliative care support and management

## 2023-10-04 NOTE — Assessment & Plan Note (Signed)
 Pressors and IVF per ICU team Plan on transition to midodrine  Appreciate ICU and palliative care management

## 2023-10-04 NOTE — Assessment & Plan Note (Signed)
 Worsening  secondary to malignancy and medication

## 2023-10-04 NOTE — Progress Notes (Signed)
 Marcus Burns   DOB:1980/08/01   FM#:[id number]    ASSESSMENT & PLAN:  Marcus Burns is a 43 year old male patient with oncologic history significant for metastatic RCC with peritoneal carcinomatosis, liver and lung mets. Patient admitted on 09/28/2023 for uncontrolled abdominal pain.    Clinically worsening with progressive liver enzyme elevation, renal failure, hypotension, anuric and transferred to ICU on 9/27.     9/27. patient was placed on pressors.  Discussed with Devian, and his wife Marcus Burns clinically deteriorating.  Recommend DNR and DNI in the setting of continuing deterioration. Continue other reversible and supportive measures as able.  Appreciate ICU assistance.  CODE STATUS have been changed to DO NOT RESUSCITATE.   9/28.  Report of interactive overnight.  Mild improvement of LFT. 9/29.  Reported no urine output.  Patient worsening of renal function.  Mild improvement of LFT. 9.30. no improvement clinically.  Renal function continued to decline.  Confirmation recommended   Discussed with palliative care, ICU team and wife, no improvement clinically and also worsening kidney function.  Transition to full comfort support is reasonable.  Assessment & Plan ARF (acute renal failure) Worsening  Transition to comfort measure in the setting of profound liver metastases and IVC thrombus Appreciate nephrology and ICU management Hypotension Pressors and IVF per ICU team Plan on transition to midodrine  Appreciate ICU and palliative care management Metastatic renal cell carcinoma (HCC) Profound liver metastases with recent progression Transitioning to comfort measures Appreciate palliative care support and management Anemia Worsening  secondary to malignancy and medication Goals of care, counseling/discussion Transitioning to comfort measures  All questions were answered.    Thank you for the consult. Will follow with you.  [name], MD 10/04/2023 11:55  AM  Subjective:  Renal function worsening today.  Report no urine output.  Responding to simple commands.  Objective:  Vitals:   10/04/23 1115 10/04/23 1130  BP: 113/69 109/64  Pulse: (!) 120 (!) 117  Resp: 20 17  Temp:    SpO2: 93% 94%     Intake/Output Summary (Last 24 hours) at 10/04/2023 1155 Last data filed at 10/04/2023 0853 Gross per 24 hour  Intake 3202.66 ml  Output 178 ml  Net 3024.66 ml    GENERAL: Opening eyes on voice ABDOMEN: abdomen firmed, tender right upper quadrant and distended Musculoskeletal: Bilateral lower extremity edema    Labs:  Recent Labs    10/01/23 1011 10/01/23 1017 10/02/23 0451 10/03/23 0355 10/04/23 0441  NA  --    < > 129*  129* 129* 133*  K  --    < > 5.3*  5.3* 5.2* 5.1  CL  --    < > 97*  97* 96* 101  CO2  --    < > 16*  16* 16* 14*  GLUCOSE  --    < > 136*  135* 124* 106*  BUN  --    < > 52*  51* 56* 73*  CREATININE  --    < > 6.87*  6.25* 6.79* 7.33*  CALCIUM  --    < > 9.0  9.1 9.3 8.8*  GFRNONAA  --    < > 9*  11* 10* 9*  PROT 6.5  --  6.4* 6.4*  --   ALBUMIN  3.7   < > 3.7  3.7 3.8  3.8 3.4*  AST 1,042*  --  680* 486*  --   ALT 380*  --  314* 264*  --   ALKPHOS 183*  --  177* 163*  --   BILITOT 1.8*  --  2.1* 2.6*  --   BILIDIR 1.2*  --   --  1.7*  --   IBILI 0.6  --   --  0.9  --    < > = values in this interval not displayed.    Studies:  DG Abd 1 View Result Date: 10/04/2023 CLINICAL DATA:  Nasogastric tube placement. EXAM: ABDOMEN - 1 VIEW COMPARISON:  02/06/2019, CT 09/27/2023 FINDINGS: Enteric tube is present with tip over the left mid abdomen likely over the distal stomach. Bowel gas pattern is nonobstructive. Remainder the exam is unchanged. IMPRESSION: 1. Nonobstructive bowel gas pattern. 2. Enteric tube with tip over the distal stomach. Electronically Signed   By: Toribio Agreste M.D.   On: 10/04/2023 11:10   DG CHEST PORT 1 VIEW Result Date: 10/01/2023 EXAM: 1 VIEW(S) XRAY OF THE CHEST 10/01/2023  02:15:00 PM COMPARISON: None available. CLINICAL HISTORY: Shock (HCC) [id number]. PICC line placement. FINDINGS: LINES, TUBES AND DEVICES: Right upper extremity PICC in place with tip at superior cavoatrial junction. LUNGS AND PLEURA: Low lung volumes. Bibasilar opacities which may reflect atelectasis or airspace disease. No pulmonary edema. No pleural effusion. No pneumothorax. HEART AND MEDIASTINUM: No acute abnormality of the cardiac and mediastinal silhouettes. BONES AND SOFT TISSUES: No acute osseous abnormality. IMPRESSION: 1. Bibasilar opacities that may represent atelectasis or airspace disease, in the setting of low lung volumes. 2. Right upper extremity PICC with tip at the superior cavoatrial junction. Electronically signed by: Waddell Calk MD 10/01/2023 02:26 PM EDT RP Workstation: [id number]   US  EKG SITE RITE Result Date: 10/01/2023 If Site Rite image not attached, placement could not be confirmed due to current cardiac rhythm.  US  RENAL Result Date: 09/30/2023 CLINICAL DATA:  Acute kidney injury EXAM: RENAL / URINARY TRACT ULTRASOUND COMPLETE COMPARISON:  CT abdomen and pelvis 09/27/2023 FINDINGS: Right Kidney: Right kidney is surgically absent. No mass demonstrated in the visualized right renal fossa. Numerous cystic lesions are demonstrated in the visualized liver. See prior CT report. Left Kidney: Renal measurements: 9.1 x 4.8 x 5.8 cm = volume: 132 mL. Diffusely increased parenchymal echotexture. Grossly normal parenchymal thickness. No hydronephrosis. Bladder: Bladder is not visualized due to overlying bowel gas. Other: Technically limited examination due to patient's body habitus and bowel gas. IMPRESSION: 1. Right kidney is surgically absent. Visualized left kidney demonstrates increased parenchymal echotexture suggesting chronic medical renal disease. No hydronephrosis. Electronically Signed   By: Elsie Gravely M.D.   On: 09/30/2023 20:17   CT ABDOMEN PELVIS W CONTRAST Result Date:  09/27/2023 CLINICAL DATA:  Abdominal pain, acute, nonlocalized EXAM: CT ABDOMEN AND PELVIS WITH CONTRAST TECHNIQUE: Multidetector CT imaging of the abdomen and pelvis was performed using the standard protocol following bolus administration of intravenous contrast. RADIATION DOSE REDUCTION: This exam was performed according to the departmental dose-optimization program which includes automated exposure control, adjustment of the mA and/or kV according to patient size and/or use of iterative reconstruction technique. CONTRAST:  80mL OMNIPAQUE  IOHEXOL  300 MG/ML  SOLN COMPARISON:  09/16/2023 FINDINGS: Lower chest: Trace right pleural effusion with right basilar atelectasis.Unchanged 2 mm left lower lobe nodule abutting the hemidiaphragm (axial 42). Hepatobiliary: Enlarged liver, which is full of innumerable hypodense masses, without significant interval change.For example, the largest mass in the central right hepatic lobe measures 5.7 x 8.4 cm (axial 38), previously measuring 5.2 x 6.6 cm.The gallbladder was not well visualized or evaluated. No intrahepatic or extrahepatic biliary ductal dilation. The  portal veins are patent. Pancreas: No mass or main ductal dilation. No peripancreatic inflammation or fluid collection. Spleen: Normal size. No mass. Adrenals/Urinary Tract: No adrenal masses. The right kidney is surgically absent. No nephrolithiasis or hydronephrosis. The urinary bladder is completely decompressed. Stomach/Bowel: The stomach is decompressed without focal abnormality. No small bowel wall thickening or inflammation. No small bowel obstruction.Normal appendix. Vascular/Lymphatic: No aortic aneurysm. No intraabdominal or pelvic lymphadenopathy. Nearly occlusive thrombus again noted within the intrahepatic IVC (axial 45). Reproductive: No prostatomegaly.Small volume free fluid in the pelvis. Other: Peritoneal nodule along the ventral abdominal wall measures 2.2 x 4.1 cm (previously 1.7 x 3.3 cm), axial 64.  multiple additional small areas of nodularity in the epigastric fat adjacent to the ventral abdominal wall (axial 55), more prominent than on the prior study. No pneumoperitoneum. Peritoneal thickening in the right paracolic gutter is also noted (axial 79). Mixed gas and fluid collection in the hepatorenal fossa is otherwise unchanged. Musculoskeletal: No acute fracture or destructive lesion. Moderate anasarca. Intramuscular metastatic disease in the inferior right rectus sheath measuring 1.4 cm (axial 76). IMPRESSION: 1. No acute intra-abdominal or pelvic abnormality. 2. Extensive intrahepatic metastases again noted filling the entire hepatic parenchyma, with slight enlargement of the index mass in the central right hepatic lobe, measuring 5.7 x 8.4 cm (previously, 5.2 x 6.6 cm, axial 38). 3. Worsening of the peritoneal metastatic disease. For example, the peritoneal nodule subjacent to the midline ventral incision (axial 64), measures 2.2 x 4.1 cm (previously, 1.7 x 3.3 cm). Trace malignant ascites in the pelvis. 4. Intramuscular metastatic disease along the inferior right rectus sheath measuring 1.4 cm (axial 76). 5. Similar appearance of the nearly occlusive thrombus within the intrahepatic IVC. Electronically Signed   By: Rogelia Myers M.D.   On: 09/27/2023 12:17   CT CHEST ABDOMEN PELVIS W CONTRAST Result Date: 09/19/2023 CLINICAL DATA:  RCC with liver Mets, clear cell carcinoma of right kidney. * Tracking Code: BO * EXAM: CT CHEST, ABDOMEN, AND PELVIS WITH CONTRAST TECHNIQUE: Multidetector CT imaging of the chest, abdomen and pelvis was performed following the standard protocol during bolus administration of intravenous contrast. RADIATION DOSE REDUCTION: This exam was performed according to the departmental dose-optimization program which includes automated exposure control, adjustment of the mA and/or kV according to patient size and/or use of iterative reconstruction technique. CONTRAST:   OMNIPAQUE  IOHEXOL  300 MG/ML  SOLN COMPARISON:  Multiple priors including CT February 02, 2023 FINDINGS: CT CHEST FINDINGS Cardiovascular: Normal caliber thoracic aorta. Normal size heart. No significant pericardial effusion/thickening. Mediastinum/Nodes: No suspicious thyroid  nodule. No pathologically enlarged mediastinal, hilar or axillary lymph nodes. Lungs/Pleura: Left lower lobe pulmonary nodule measures 1-2 mm on image 111/4 previously 2 mm. No new suspicious pulmonary nodules or masses. Right lower lobe atelectasis versus scarring. Musculoskeletal: No aggressive lytic or blastic lesion of bone. CT ABDOMEN PELVIS FINDINGS Hepatobiliary: Innumerable bilobar hepatic lesions are progressed from prior examination. For reference: -indexed lesion in the dome of the liver measures 5.2 x 2.9 cm on image 43/2 previously 2.3 x 2.2 cm. Fluid collection in Morison's pouch described on prior examination not confidently seen on today's study. Cholelithiasis.  No biliary ductal dilation. Pancreas: No pancreatic ductal dilation or evidence of acute inflammation. Spleen: No splenomegaly. Adrenals/Urinary Tract: Right adrenal gland and kidney are surgically absent Left adrenal gland and kidney are unremarkable. Urinary bladder is minimally distended. Stomach/Bowel: Stomach is unremarkable for degree of distension. No pathologic dilation of small or large bowel. No evidence of acute  bowel inflammation. Vascular/Lymphatic: Mixed attenuating nodularity in the intrahepatic IVC on image 80/2 is similar prior suspicious for thrombus. No pathologically enlarged abdominal or pelvic lymph nodes identified. Reproductive: Prostate gland is visualized. Other: Overall decreased omental caking with trace abdominopelvic free fluid. Nodular focus in the anterior abdomen subjacent to the incision line measures 3.3 x 1.7 cm on image 87/2 previously 4.1 x 3.0 cm. Musculoskeletal: No aggressive lytic or blastic lesion of bone. IMPRESSION: 1.  Innumerable bilobar hepatic lesions are progressed from prior examination, consistent with worsening hepatic metastatic disease. 2. Overall decreased omental caking with trace abdominopelvic free fluid. 3. Mixed attenuating nodularity in the intrahepatic IVC is similar prior suspicious for thrombus. 4. Left lower lobe pulmonary nodule measures 1-2 mm previously 2 mm. No new suspicious pulmonary nodules or masses. 5. Cholelithiasis. Electronically Signed   By: Reyes Holder M.D.   On: 09/19/2023 16:33

## 2023-10-04 NOTE — Progress Notes (Signed)
 NAME:  Marcus Burns, MRN:  [id number], DOB:  04-Feb-1980, LOS: 5 ADMISSION DATE:  09/28/2023, CONSULTATION DATE: 10/01/2023 REFERRING MD:  TRH, CHIEF COMPLAINT: Abdominal pain   History of Present Illness:  43 year old man who presented to [facility] 9/23 with abdominal pain. PMHx significant for HTN, PE, GERD, nephrolithiasis, RCC (s/p right nephrectomy, widely metastatic to liver, peritoneum, R rectus sheath). Transferred to the ICU with shock, liver failure, renal failure, encephalopathy, appears to be actively dying.  Admitted to [facility] 9/23 with abdominal pain. CT A/P did not reveal any surgical emergency; demonstrated worsening peritoneal carcinomatosis, worsening liver metastasis, rectus sheath metastasis. Noted to have progressive renal failure. Developed hypotension and encephalopathy transferred to the ICU. Documentation 9/27 via Oncologist demonstrates he agreed to DNR. Currently at the time of my assessment later in the day he is unable to participate in any conversation, does not have capacity. There is no clear source of infection. He is oliguric. INR elevated. LFTs rising. Creatinine rising. Multisystem organ failure. Appears to be actively dying.  PCCM consulted for ICU management.  Pertinent Medical History:   Past Medical History:  Diagnosis Date   Acute prostatitis 06/19/2007   ALLERGIC RHINITIS 06/19/2007   ANXIETY 06/19/2007   Cancer (HCC)    kidney   ELEVATED BLOOD PRESSURE WITHOUT DIAGNOSIS OF HYPERTENSION 06/19/2007   GERD 06/19/2007   GERD (gastroesophageal reflux disease)    History of COVID-19 01/24/2019   History of kidney stones    Hypertension    Inguinal hernia    Migraines    NEPHROLITHIASIS, HX OF 06/19/2007   PE (pulmonary thromboembolism) (HCC)    Significant Hospital Events: Including procedures, antibiotic start and stop dates in addition to other pertinent events   9/24 - Admitted to [facility] with abdominal pain. CT A/P without acute findings, +worsening  peritoneal carcinomatosis, worsening liver metastasis, rectus sheath metastasis, nearly occlusive IVC clot. 9/27 - Hypotensive requiring vasopressor initiation. Transferred to ICU. MOSF with worsening renal failure.  Interim History / Subjective:  No significant events Weaned off of vasopressin yesterday evening, but required resumption of pressors overnight Remains on Levo 7mcg, unable to effectively wean Marcus Burns remains intermittently interactive but mostly fatigued, uncomfortable BP not tolerating pain medications Concerned we have reached the maximum level of interventions we can offer Ongoing GOC discussions with transition to comfort-focused care  Objective:    Blood pressure 109/64, pulse (!) 117, temperature 98 F (36.7 C), temperature source Axillary, resp. rate 17, height 6' 4 (1.93 m), weight 119.5 kg, SpO2 94%.        Intake/Output Summary (Last 24 hours) at 10/04/2023 1159 Last data filed at 10/04/2023 0853 Gross per 24 hour  Intake 3202.66 ml  Output 178 ml  Net 3024.66 ml   Filed Weights   10/02/23 0500 10/03/23 0412 10/04/23 0500  Weight: 118.6 kg 121.1 kg 119.5 kg   Physical Examination: General: Acutely ill-appearing middle-aged man in NAD, appears uncomfortable. HEENT: /AT, anicteric sclera, PERRL, dry mucous membranes. Neuro: Lethargic, will open eyes but slow to respond. Responds to verbal stimuli. Following commands intermittently. Generalized weakness. CV: Tachycardic to 110s-120s, no m/g/r. PULM: Breathing even and unlabored on RA. Lung fields diminished bilaterally at bases. GI: Soft, mildly distended, exquisitely TTP over RUQ, tender over RLQ. Hypoactive bowel sounds. Extremities: Bilateral symmetric 2+ LE edema noted. Skin: Warm/dry, no rashes.  Resolved Problem List:    Assessment and Plan:   Shock in the setting of MOSF with hypotension, escalating pressor requirement. No clear source of infection.  Metabolic encephalopathy in MOSF in the  setting of widely metastatic cancer and renal failure Acute hypoxemic respiratory failure, likely related to hypoventilation in setting encephalopathy H/o of pulmonary embolism Near-occlusive IVC thrombus Acute renal failure on CKD 3A, possibly CIN versus progressive renal failure and widely metastatic RCC Hyperkalemia in the setting of renal failure, progressive renal failure not a candidate for dialysis Widely metastatic renal cell carcinoma with metastasis to liver lung and peritoneal carcinomatosis Abdominal pain related to worsening metastatic disease (peritoneal carcinomatosis/hepatic metastases) Elevated LFTs and liver failure with hyperbilirubinemia progressively worse in setting of hypotension  - Unfortunately, Marcus Burns continues to clinically decompensate despite all available interventions; after discussion with PMT/PCCM family has decided to transition to comfort-focused care - Comfort Care orders placed - PAD protocol with Fentanyl  infusion + boluses PRN, Versed  PRN, Haldol PRN - Antiemetics, antipruritics and medications for secretions management ordered - Discontinue antibiotics and all discomfort-inducing measures (CBGs, labs, etc) - Vasopressor downtitration and discontinuation per protocol - DNR - Comfort Care, anticipate in-hospital death  Critical care time:    The patient is critically ill with multiple organ system failure and requires high complexity decision making for assessment and support, frequent evaluation and titration of therapies, advanced monitoring, review of radiographic studies and interpretation of complex data.   Critical Care Time devoted to patient care services, exclusive of separately billable procedures, described in this note is 33 minutes.  Marcus CHRISTELLA Waylon Hershey, PA-C [facility] 10/04/23 11:59 AM  Please see [email or web address] for pager details.  From 7A-7P if no response, please call [phone] After hours, please call ELink  [phone]

## 2023-10-04 NOTE — Progress Notes (Signed)
 I personally saw the patient and performed a substantive portion of this encounter, including a complete performance of at least one of the key components (MDM, Hx and/or Exam), in conjunction with the Advanced Practice Provider.    Metastatic renal cell cancer with multiorgan failure  He is back on Levophed. He is unable to swallow mainly due to poor mental status he was able to do ice chips with speech today.  On exam -lethargic, follows one-step commands, mild pallor, no asterixis, soft nontender abdomen, 2+ edema.  Urine output remains poor 200 cc Labs show worsening renal function 73/7.3, acidosis bicarb 14, mild leukocytosis, stable anemia   Impression/plan Appreciate renal, oncology and palliative care input. Do not expect organ failure to resolve, I think we should increase level of palliation.  His comfort is clearly more important at this point rather than attempting to resolve his problems.  Agree with no escalation.  He would likely need residential hospice.  Shock NOS, treating as septic shock - Taper Levophed to off - Since he is unable to take orals, place NG tube to facilitate midodrine  - Complete 7 days of antibiotics , culture negative.  AKI -not a candidate for dialysis, add bicarbonate  IVC thrombus with history of pulmonary embolism -convert from heparin  to Eliquis  at some point  DNR, DNI.  Chemotherapy medications not helpful at this point  My independent critical care time was 31 minutes  Marcus Burns V. Jude MD

## 2023-10-04 NOTE — Assessment & Plan Note (Signed)
 Transitioning to comfort measures

## 2023-10-04 NOTE — Progress Notes (Signed)
 Daily Progress Note   Patient Name: Marcus Burns       Date: 10/04/2023 DOB: [birth year]  Age: 43 y.o. MRN#: [id number] Attending Physician: Jude Harden GAILS, MD Primary Care Physician: Norleen Lynwood ORN, MD Admit Date: 09/28/2023  Reason for Consultation/Follow-up: Establishing goals of care, Non pain symptom management, and Pain control  Subjective:  Resting in bed Wife, son and daughter at bedside Brother also at bedside Discussed with RN Discussed with family.  Patient awakens some, is reasonably alert but doesn't verbalize Back on pressors Monitor noted.   Length of Stay: 5  Current Medications: Scheduled Meds:  . belzutifan   120 mg Oral Daily  . cabozantinib   60 mg Oral Daily  . Chlorhexidine  Gluconate Cloth  6 each Topical Daily  . midodrine   10 mg Per Tube TID WC  . pantoprazole   40 mg Oral Q0600  . polyethylene glycol  17 g Oral Daily  . senna-docusate  1 tablet Oral BID  . sodium chloride  flush  10-40 mL Intracatheter Q12H  . sodium chloride  flush  3 mL Intravenous Q12H  . sodium zirconium cyclosilicate   10 g Per Tube Once    Continuous Infusions: . sodium chloride  50 mL/hr at 10/04/23 0853  . ceFEPime (MAXIPIME) IV Stopped (10/03/23 1420)  . chlorproMAZINE (THORAZINE) 25 mg in sodium chloride  0.9 % 25 mL IVPB    . heparin  1,750 Units/hr (10/04/23 0853)  . linezolid (ZYVOX) IV 600 mg (10/04/23 1003)  . norepinephrine (LEVOPHED) Adult infusion 5 mcg/min (10/04/23 0853)    PRN Meds: chlorproMAZINE (THORAZINE) 25 mg in sodium chloride  0.9 % 25 mL IVPB, fentaNYL  (SUBLIMAZE ) injection, ondansetron  **OR** ondansetron  (ZOFRAN ) IV, mouth rinse, sodium chloride  flush, sorbitol   Physical Exam         Resting in bed Appears with generalized weakness No distress but  appears chronically ill Family at bedside states that patient is restless at times and also complaining of pain Bilateral LE edema  Vital Signs: BP 108/60   Pulse (!) 123   Temp 98 F (36.7 C) (Axillary)   Resp 17   Ht 6' 4 (1.93 m)   Wt 119.5 kg   SpO2 95%   BMI 32.07 kg/m  SpO2: SpO2: 95 % O2 Device: O2 Device: Room Air O2 Flow Rate: O2 Flow Rate (L/min): 2 L/min  Intake/output  summary:  Intake/Output Summary (Last 24 hours) at 10/04/2023 1008 Last data filed at 10/04/2023 0853 Gross per 24 hour  Intake 3202.66 ml  Output 178 ml  Net 3024.66 ml   LBM: Last BM Date : 09/27/23 Baseline Weight: Weight: 112.1 kg Most recent weight: Weight: 119.5 kg       Palliative Assessment/Data:      Patient Active Problem List   Diagnosis Date Noted  . Need for emotional support 10/03/2023  . High risk medication use 10/03/2023  . Medication management 10/03/2023  . Counseling and coordination of care 10/03/2023  . Malignant neoplasm metastatic to liver (HCC) 10/03/2023  . Neoplasm, metastatic (HCC) 10/03/2023  . ACP (advance care planning) 10/03/2023  . Palliative care encounter 10/03/2023  . Liver dysfunction 10/02/2023  . Hypotension 10/01/2023  . Uncontrolled pain 09/28/2023  . Metastatic renal cell carcinoma (HCC) 09/28/2023  . Sinus tachycardia 09/28/2023  . Hypomagnesemia 09/02/2023  . Drug-induced diarrhea 07/04/2023  . CKD (chronic kidney disease) 01/13/2023  . At risk for side effect of medication 11/02/2022  . Cancer associated pain 10/20/2022  . ARF (acute renal failure) 10/13/2022  . Clear cell carcinoma of kidney (HCC) 07/16/2022  . Renal mass 06/18/2022  . Anemia 03/22/2022  . Obesity (BMI 30-39.9) 03/12/2022  . History of pulmonary embolism 03/11/2022  . MRSA carrier 03/11/2022  . Hypertension 03/11/2022  . Pleuritic chest pain 03/10/2022  . Right renal stone 12/24/2021  . Cough 12/24/2021  . Wheezing 12/24/2021  . Rash 07/05/2021  . Left cervical  radiculopathy 01/01/2021  . Vitamin D  deficiency 10/17/2019  . Insomnia 10/17/2019  . Gallstones 09/15/2018  . Renal stones 09/15/2018  . Back pain 05/14/2017  . Hyperglycemia 11/19/2014  . Scrotal mass 01/14/2012  . Obesity 10/21/2010  . Goals of care, counseling/discussion 10/18/2010  . Anxiety 06/19/2007  . Allergic rhinitis 06/19/2007  . GERD 06/19/2007  . NEPHROLITHIASIS, HX OF 06/19/2007    Palliative Care Assessment & Plan   Patient Profile:  Patient is a 43 year old male with a past medical history of metastatic renal cell carcinoma status post right nephrectomy with metastatic disease to liver, peritoneum, and right rectus sheath, hypertension, PE, GERD, and nephrolithiasis who was admitted on 09/28/2023 for management of abdominal pain. During hospitalization, patient has deteriorated requiring transfer to ICU for management of shock in the setting of multisystem organ failure, metabolic encephalopathy, acute hypoxic respiratory failure, acute renal failure, and worsening liver function. Nephrology and oncology providing recommendations. Palliative medicine team consulted to assist with complex medical decision making.   Assessment:  multisystem organ failure, particularly renal failure. Patient not appropriate for hemodialysis.  Generalized pain Anxiety Air hunger Restlessness  Recommendations/Plan:  Agree with DNR DNI Adjust IV Fentanyl  PRN pain Add IV Ativan  PRN restlessness and monitor Overall direction of care appears to be towards prioritizing symptom management.     Code Status:    Code Status Orders  (From admission, onward)           Start     Ordered   10/01/23 1827  Do not attempt resuscitation (DNR)- Limited -Do Not Intubate (DNI)  (Code Status)  Continuous       Question Answer Comment  If pulseless and not breathing No CPR or chest compressions.   In Pre-Arrest Conditions (Patient Is Breathing and Has A Pulse) Do not intubate. Provide all  appropriate non-invasive medical interventions. Avoid ICU transfer unless indicated or required.   Consent: Discussion documented in EHR or advanced directives reviewed  10/01/23 1826           Code Status History     Date Active Date Inactive Code Status Order ID Comments User Context   09/28/2023 1602 10/01/2023 1826 Full Code [id number]  Sebastian Toribio GAILS, MD ED   10/13/2022 0103 10/14/2022 1704 Full Code [id number]  Charlton Evalene RAMAN, MD ED   06/18/2022 1804 06/25/2022 2054 Full Code [id number]  Cory Alan RIGGERS Inpatient   03/11/2022 0425 03/14/2022 1659 Full Code [id number]  Howerter, Eva NOVAK, DO ED       Prognosis:  < 2 weeks  Discharge Planning: To Be Determined  Care plan was discussed with  RN, patient, wife, son and daughter at bedside, brother at bedside.   Thank you for allowing the Palliative Medicine Team to assist in the care of this patient. High MDM.  Problems Addressed: One or more chronic illnesses with severe exacerbation, progression, or side effects of treatment.   Risks: Parenteral controlled substances    Greater than 50%  of this time was spent counseling and coordinating care related to the above assessment and plan.  Lonia Serve, MD  Please contact Palliative Medicine Team phone at [phone] for questions and concerns.

## 2023-10-04 NOTE — Evaluation (Addendum)
 Clinical/Bedside Swallow Evaluation Patient Details  Name: Marcus Burns MRN: [id number] Date of Birth: [birth year]  Today's Date: 10/04/2023 Time: SLP Start Time (ACUTE ONLY): 9057 SLP Stop Time (ACUTE ONLY): 0957 SLP Time Calculation (min) (ACUTE ONLY): 15 min  Past Medical History:  Past Medical History:  Diagnosis Date   Acute prostatitis 06/19/2007   ALLERGIC RHINITIS 06/19/2007   ANXIETY 06/19/2007   Cancer (HCC)    kidney   ELEVATED BLOOD PRESSURE WITHOUT DIAGNOSIS OF HYPERTENSION 06/19/2007   GERD 06/19/2007   GERD (gastroesophageal reflux disease)    History of COVID-19 01/24/2019   History of kidney stones    Hypertension    Inguinal hernia    Migraines    NEPHROLITHIASIS, HX OF 06/19/2007   PE (pulmonary thromboembolism) (HCC)    Past Surgical History:  Past Surgical History:  Procedure Laterality Date   CYSTOSCOPY WITH RETROGRADE PYELOGRAM, URETEROSCOPY AND STENT PLACEMENT Bilateral 02/09/2019   Procedure: CYSTOSCOPY WITH RETROGRADE PYELOGRAM, DIAGNOSTIC URETEROSCOPY AND STENT PLACEMENT;  Surgeon: Alvaro Hummer, MD;  Location: [facility];  Service: Urology;  Laterality: Bilateral;  75 MINS   CYSTOSCOPY WITH RETROGRADE PYELOGRAM, URETEROSCOPY AND STENT PLACEMENT Bilateral 02/23/2019   Procedure: CYSTOSCOPY WITH RETROGRADE PYELOGRAM, URETEROSCOPY AND STENT PLACEMENT;  Surgeon: Alvaro Hummer, MD;  Location: [facility];  Service: Urology;  Laterality: Bilateral;  1 HR   HERNIA REPAIR     HOLMIUM LASER APPLICATION Bilateral 02/23/2019   Procedure: HOLMIUM LASER APPLICATION;  Surgeon: Alvaro Hummer, MD;  Location: [facility];  Service: Urology;  Laterality: Bilateral;   ROBOT ASSISTED LAPAROSCOPIC NEPHRECTOMY Right 06/18/2022   Procedure: XI ROBOTIC ASSISTED LAPAROSCOPIC RADICAL NEPHRECTOMY AND PERICAVAL LYMPH NODE DISSECTION;  Surgeon: Alvaro Hummer KATHEE Mickey., MD;  Location: [facility];  Service: Urology;  Laterality: Right;  3 HRS   HPI:  Patient is a 43 y.o. male with medical  history significant of clear-cell renal cell carcinoma with metastatic disease with pulmonary nodules, liver metastases, peritoneal carcinomatosis, history of PE on chronic anticoagulation with Eliquis . History of hypertension, GERD, presenting to the ED on 9/24 with worsening abdominal pain. During hospitalization, patient has deteriorated requiring transfer to ICU for management of shock in teh setting of multisystem organ failure, metabolic encephalopathy, acute hypoxic respiratory failure, acute renal failure, and worsening liver function. Cortrack on 9/28 was unsuccessful, patient remains NPO. Bedside swallow evaluation ordered to assess patient's swallow.    Assessment / Plan / Recommendation  Clinical Impression  Patient's wife and his brother were at bedside for this evaluation. Patient presented with drowsiness. He was alert and awake after tactile stimulation and responding to his name. Patient followed command to open mouth however limited view. After oral care, SLP assessed patient's swallow via PO's of ice chips. With ice chips, patient's attention was fleeting and inconsistent. Patient was observed to masticate ice chip and initiate a swallow. No overt s/s of aspiration observed. He required moderate to maximum verbal and tactile cues. Recommended patient remain NPO except for ice chips with full supervison provided by wife or nurse. Discussed with RN. SLP will continue to follow. SLP Visit Diagnosis: Dysphagia, unspecified (R13.10)    Aspiration Risk       Diet Recommendation NPO;Ice chips PRN after oral care    Medication Administration: Via alternative means Supervision: Full supervision/cueing for compensatory strategies Compensations: Small sips/bites;Slow rate Postural Changes: Seated upright at 90 degrees;Remain upright for at least 30 minutes after po intake    Other  Recommendations Oral Care Recommendations: Oral care BID;Oral care prior  to ice chip/H20     Assistance  Recommended at Discharge    Functional Status Assessment Patient has had a recent decline in their functional status and/or demonstrates limited ability to make significant improvements in function in a reasonable and predictable amount of time  Frequency and Duration min 2x/week  1 week       Prognosis Prognosis for improved oropharyngeal function: Guarded Barriers to Reach Goals: Other (Comment) (medical prognosis)      Swallow Study   General Date of Onset: 10/04/23 HPI: Patient is a 43 y.o. male with medical history significant of clear-cell renal cell carcinoma with metastatic disease with pulmonary nodules, liver metastases, peritoneal carcinomatosis, history of PE on chronic anticoagulation with Eliquis . History of hypertension, GERD, presenting to the ED on 9/24 with worsening abdominal pain. During hospitalization, patient has deteriorated requiring transfer to ICU for management of shock in teh setting of multisystem organ failure, metabolic encephalopathy, acute hypoxic respiratory failure, acute renal failure, and worsening liver function. Cortrack on 9/28 was unsuccessful, patient remains NPO. Bedside swallow evaluation ordered to assess patient's swallow. Type of Study: Bedside Swallow Evaluation Diet Prior to this Study: NPO Temperature Spikes Noted: No Respiratory Status: Room air History of Recent Intubation: No Behavior/Cognition: Lethargic/Drowsy;Requires cueing Oral Cavity Assessment: Within Functional Limits Oral Care Completed by SLP: Yes Oral Cavity - Dentition: Adequate natural dentition Self-Feeding Abilities: Total assist Patient Positioning: Upright in bed    Oral/Motor/Sensory Function Overall Oral Motor/Sensory Function: Within functional limits   Ice Chips Ice chips: Within functional limits Presentation: Spoon   Thin Liquid      Nectar Thick     Honey Thick     Puree     Solid           Damien Hy  Graduate SLP Clinican

## 2023-10-04 NOTE — Plan of Care (Signed)
  Problem: Education: Goal: Knowledge of General Education information will improve Description: Including pain rating scale, medication(s)/side effects and non-pharmacologic comfort measures Outcome: Progressing   Problem: Clinical Measurements: Goal: Respiratory complications will improve Outcome: Progressing   Problem: Clinical Measurements: Goal: Cardiovascular complication will be avoided Outcome: Not Progressing   Problem: Nutrition: Goal: Adequate nutrition will be maintained Outcome: Not Progressing   Problem: Elimination: Goal: Will not experience complications related to bowel motility Outcome: Not Progressing Goal: Will not experience complications related to urinary retention Outcome: Not Progressing   Problem: Pain Managment: Goal: General experience of comfort will improve and/or be controlled Outcome: Not Progressing

## 2023-10-04 NOTE — Progress Notes (Signed)
 PHARMACY - ANTICOAGULATION CONSULT NOTE  Pharmacy Consult for heparin  Indication: IVC thrombus, hx pulmonary embolus  Allergies  Allergen Reactions   Amlodipine  Other (See Comments)    Edema ( 5 mg)   Doxycycline  Other (See Comments)    Light headed , passed out    Patient Measurements: Height: 6' 4 (193 cm) Weight: 119.5 kg (263 lb 7.2 oz) IBW/kg (Calculated) : 86.8 HEPARIN  DW (KG): 109.6  Vital Signs: Temp: 96.5 F (35.8 C) (09/30 0555) Temp Source: Axillary (09/30 0555) BP: 101/56 (09/30 0630) Pulse Rate: 119 (09/30 0630)  Labs: Recent Labs    10/01/23 1011 10/01/23 1017 10/02/23 0451 10/03/23 0355 10/03/23 2107 10/04/23 0441  HGB 11.9*  --  11.1* 10.0*  --  9.8*  HCT 39.1  --  34.8* 30.6*  --  29.6*  PLT 211  --  220 194  --  247  APTT  --   --   --   --  98* 81*  LABPROT 25.0*  --   --   --   --   --   INR 2.1*  --   --   --   --   --   HEPARINUNFRC  --   --   --   --  >1.10* >1.10*  CREATININE  --    < > 6.87*  6.25* 6.79*  --  7.33*   < > = values in this interval not displayed.    Estimated Creatinine Clearance: 18.2 mL/min (A) (by C-G formula based on SCr of 7.33 mg/dL (H)).   Medical History: Past Medical History:  Diagnosis Date   Acute prostatitis 06/19/2007   ALLERGIC RHINITIS 06/19/2007   ANXIETY 06/19/2007   Cancer (HCC)    kidney   ELEVATED BLOOD PRESSURE WITHOUT DIAGNOSIS OF HYPERTENSION 06/19/2007   GERD 06/19/2007   GERD (gastroesophageal reflux disease)    History of COVID-19 01/24/2019   History of kidney stones    Hypertension    Inguinal hernia    Migraines    NEPHROLITHIASIS, HX OF 06/19/2007   PE (pulmonary thromboembolism) (HCC)     Medications:  Eliquis  5 mg BID - last dose 9/27 ~ 1030 Subcu heparin  9/28 >> last dose 9/29 ~ 0500  Assessment: 43 year old male presented with abdominal pain. Patient with RCC s/p right nephrectomy and widely metastatic, worsening. INR elevated. AST/ALT elevated concerning for  developing liver failure, SCr continues to worsen. Not a candidate for renal replacement therapy. He has required vasopressor support - off norepinephrine currently, remains on vasopressin. Oncology following, on oral chemotherapy agents.  Pharmacy consulted to transition to heparin  infusion in setting of nearly occlusive thrombus within the intrahepatic IVC as well as history of PE.  Unable to take oral meds at this time. Cortrak attempted 9/28. Patient fatigued.  CBC slow trend downward. INR/aPTT elevated 9/27 in setting of liver disease. Given poor renal function, expect prolonged clearance of Eliquis .  10/04/2023 Heparin  level > 1.1 on heparin  1750 units/hr - level elevated due to recent Eliquis  use aPTT 81 sec on heparin  1750 units/hr - therapeutic Hgb 9.8 - low, stable  Plts 247 - improved, stable No infusion issues or bleeding, per RN  Goal of Therapy:  Heparin  level 0.3-0.7 units/ml aPTT 66-102 seconds Monitor platelets by anticoagulation protocol: Yes   Plan:  Continue heparin  infusion at 1750 units/hr Daily heparin  level and aPTT and CBC Continue to follow for plan of care, s/sx of bleeding   Lacinda Moats, PharmD Clinical Pharmacist  9/30/20257:31 AM

## 2023-10-04 NOTE — Progress Notes (Signed)
 SLP Cancellation Note  Patient Details Name: Marcus Burns MRN: [id number] DOB: 08-19-1980   Cancelled treatment:       Reason Eval/Treat Not Completed: Other (comment) SLP s/o at this time. Per palliative care he is transitioning to comfort care.   Tyrease Vandeberg  Graduate SLP Clinican

## 2023-10-04 NOTE — Assessment & Plan Note (Signed)
 Worsening  Transition to comfort measure in the setting of profound liver metastases and IVC thrombus Appreciate nephrology and ICU management

## 2023-10-05 DEATH — deceased

## 2023-10-06 ENCOUNTER — Inpatient Hospital Stay

## 2023-10-06 ENCOUNTER — Ambulatory Visit

## 2023-10-07 ENCOUNTER — Telehealth: Payer: Self-pay | Admitting: *Deleted

## 2023-10-07 LAB — CULTURE, BLOOD (ROUTINE X 2)
Culture: NO GROWTH
Culture: NO GROWTH

## 2023-10-07 NOTE — Telephone Encounter (Signed)
 Wife called regarding [employer] forms that were to have been faxed on Monday to Dr Tina. RN unable to locate forms. Wife is going to email the forms to Dr Fredrik nurse.

## 2023-10-08 ENCOUNTER — Other Ambulatory Visit: Payer: Self-pay | Admitting: Internal Medicine

## 2023-10-10 ENCOUNTER — Inpatient Hospital Stay

## 2023-10-11 ENCOUNTER — Inpatient Hospital Stay: Admitting: Dietician

## 2023-10-31 ENCOUNTER — Other Ambulatory Visit: Payer: Self-pay | Admitting: Hematology and Oncology

## 2023-11-05 NOTE — Plan of Care (Signed)
 Pain/anxiety medications given prn to manage symptoms.  Explained all medications available to wife at bedside.   Problem: Coping: Goal: Level of anxiety will decrease 10-21-2023 0305 by Porter Olam HERO, RN Outcome: Progressing 2023/10/21 0302 by Porter Olam HERO, RN Outcome: Progressing   Problem: Pain Managment: Goal: General experience of comfort will improve and/or be controlled 2023-10-21 0305 by Porter Olam HERO, RN Outcome: Progressing Oct 21, 2023 0302 by Porter Olam HERO, RN Outcome: Progressing

## 2023-11-05 NOTE — Discharge Summary (Signed)
 DEATH SUMMARY   Patient Details  Name: Marcus Burns MRN: [id number] DOB: December 21, 1980 ERE:Gnyw, Lynwood ORN, MD  Admission/Discharge Information   Admit Date:  2023-10-06  Date of Death: Date of Death: Oct 13, 2023  Time of Death: Time of Death: 0230  Length of Stay: 6   Principle Cause of death: Metastatic renal cell cancer AKI   Hospital Diagnoses: Principal Problem:   Uncontrolled pain Active Problems:   Anxiety   GERD   Goals of care, counseling/discussion   Hypertension   Anemia   Clear cell carcinoma of kidney (HCC)   ARF (acute renal failure)   CKD (chronic kidney disease)   Metastatic renal cell carcinoma (HCC)   Sinus tachycardia   Hypotension   Liver dysfunction   Need for emotional support   High risk medication use   Medication management   Counseling and coordination of care   Malignant neoplasm metastatic to liver (HCC)   Neoplasm, metastatic (HCC)   ACP (advance care planning)   Palliative care encounter   HPI 43 year old man who presented to [facility] 9/23 with abdominal pain. PMHx significant for HTN, PE, GERD, nephrolithiasis, RCC (s/p right nephrectomy, widely metastatic to liver, peritoneum, R rectus sheath). Transferred to the ICU with shock, liver failure, renal failure, encephalopathy, appears to be actively dying.   Admitted to [facility] 9/23 with abdominal pain. CT A/P did not reveal any surgical emergency; demonstrated worsening peritoneal carcinomatosis, worsening liver metastasis, rectus sheath metastasis. Noted to have progressive renal failure. Developed hypotension and encephalopathy transferred to the ICU. Documentation 9/27 via Oncologist demonstrates he agreed to DNR. Currently at the time of my assessment later in the day he is unable to participate in any conversation, does not have capacity. There is no clear source of infection. He is oliguric. INR elevated. LFTs rising. Creatinine rising. Multisystem organ failure.   Significant Hospital  Events: Including procedures, antibiotic start and stop dates in addition to other pertinent events   October 06, 2023 - Admitted to [facility] with abdominal pain. CT A/P without acute findings, +worsening peritoneal carcinomatosis, worsening liver metastasis, rectus sheath metastasis, nearly occlusive IVC clot. 9/27 - Hypotensive requiring vasopressor initiation. Transferred to ICU. MOSF with worsening renal failure.    Assessment and Plan:    Shock in the setting of MOSF with hypotension, escalating pressor requirement. No clear source of infection. Metabolic encephalopathy in MOSF in the setting of widely metastatic cancer and renal failure Acute hypoxemic respiratory failure, likely related to hypoventilation in setting encephalopathy H/o of pulmonary embolism Near-occlusive IVC thrombus Acute renal failure on CKD 3A, possibly CIN versus progressive renal failure and widely metastatic RCC Hyperkalemia in the setting of renal failure, progressive renal failure not a candidate for dialysis Widely metastatic renal cell carcinoma with metastasis to liver lung and peritoneal carcinomatosis Abdominal pain related to worsening metastatic disease (peritoneal carcinomatosis/hepatic metastases) Elevated LFTs and liver failure with hyperbilirubinemia progressively worse in setting of hypotension   - Unfortunately, Gillian continued to clinically decompensate despite all available interventions; after discussion with PMT/PCCM family has decided to transition to comfort-focused care  Consultations: Renal, oncology, Palliative care  The results of significant diagnostics from this hospitalization (including imaging, microbiology, ancillary and laboratory) are listed below for reference.   Significant Diagnostic Studies: DG Abd 1 View Result Date: 10/04/2023 CLINICAL DATA:  Nasogastric tube placement. EXAM: ABDOMEN - 1 VIEW COMPARISON:  02/06/2019, CT 09/27/2023 FINDINGS: Enteric tube is present with tip over the left  mid abdomen likely over the distal stomach. Bowel gas  pattern is nonobstructive. Remainder the exam is unchanged. IMPRESSION: 1. Nonobstructive bowel gas pattern. 2. Enteric tube with tip over the distal stomach. Electronically Signed   By: Toribio Agreste M.D.   On: 10/04/2023 11:10   DG CHEST PORT 1 VIEW Result Date: 10/01/2023 EXAM: 1 VIEW(S) XRAY OF THE CHEST 10/01/2023 02:15:00 PM COMPARISON: None available. CLINICAL HISTORY: Shock (HCC) [id number]. PICC line placement. FINDINGS: LINES, TUBES AND DEVICES: Right upper extremity PICC in place with tip at superior cavoatrial junction. LUNGS AND PLEURA: Low lung volumes. Bibasilar opacities which may reflect atelectasis or airspace disease. No pulmonary edema. No pleural effusion. No pneumothorax. HEART AND MEDIASTINUM: No acute abnormality of the cardiac and mediastinal silhouettes. BONES AND SOFT TISSUES: No acute osseous abnormality. IMPRESSION: 1. Bibasilar opacities that may represent atelectasis or airspace disease, in the setting of low lung volumes. 2. Right upper extremity PICC with tip at the superior cavoatrial junction. Electronically signed by: Waddell Calk MD 10/01/2023 02:26 PM EDT RP Workstation: [id number]   US  EKG SITE RITE Result Date: 10/01/2023 If Site Rite image not attached, placement could not be confirmed due to current cardiac rhythm.  US  RENAL Result Date: 09/30/2023 CLINICAL DATA:  Acute kidney injury EXAM: RENAL / URINARY TRACT ULTRASOUND COMPLETE COMPARISON:  CT abdomen and pelvis 09/27/2023 FINDINGS: Right Kidney: Right kidney is surgically absent. No mass demonstrated in the visualized right renal fossa. Numerous cystic lesions are demonstrated in the visualized liver. See prior CT report. Left Kidney: Renal measurements: 9.1 x 4.8 x 5.8 cm = volume: 132 mL. Diffusely increased parenchymal echotexture. Grossly normal parenchymal thickness. No hydronephrosis. Bladder: Bladder is not visualized due to overlying bowel gas. Other:  Technically limited examination due to patient's body habitus and bowel gas. IMPRESSION: 1. Right kidney is surgically absent. Visualized left kidney demonstrates increased parenchymal echotexture suggesting chronic medical renal disease. No hydronephrosis. Electronically Signed   By: Elsie Gravely M.D.   On: 09/30/2023 20:17   CT ABDOMEN PELVIS W CONTRAST Result Date: 09/27/2023 CLINICAL DATA:  Abdominal pain, acute, nonlocalized EXAM: CT ABDOMEN AND PELVIS WITH CONTRAST TECHNIQUE: Multidetector CT imaging of the abdomen and pelvis was performed using the standard protocol following bolus administration of intravenous contrast. RADIATION DOSE REDUCTION: This exam was performed according to the departmental dose-optimization program which includes automated exposure control, adjustment of the mA and/or kV according to patient size and/or use of iterative reconstruction technique. CONTRAST:  80mL OMNIPAQUE  IOHEXOL  300 MG/ML  SOLN COMPARISON:  09/16/2023 FINDINGS: Lower chest: Trace right pleural effusion with right basilar atelectasis.Unchanged 2 mm left lower lobe nodule abutting the hemidiaphragm (axial 42). Hepatobiliary: Enlarged liver, which is full of innumerable hypodense masses, without significant interval change.For example, the largest mass in the central right hepatic lobe measures 5.7 x 8.4 cm (axial 38), previously measuring 5.2 x 6.6 cm.The gallbladder was not well visualized or evaluated. No intrahepatic or extrahepatic biliary ductal dilation. The portal veins are patent. Pancreas: No mass or main ductal dilation. No peripancreatic inflammation or fluid collection. Spleen: Normal size. No mass. Adrenals/Urinary Tract: No adrenal masses. The right kidney is surgically absent. No nephrolithiasis or hydronephrosis. The urinary bladder is completely decompressed. Stomach/Bowel: The stomach is decompressed without focal abnormality. No small bowel wall thickening or inflammation. No small bowel  obstruction.Normal appendix. Vascular/Lymphatic: No aortic aneurysm. No intraabdominal or pelvic lymphadenopathy. Nearly occlusive thrombus again noted within the intrahepatic IVC (axial 45). Reproductive: No prostatomegaly.Small volume free fluid in the pelvis. Other: Peritoneal nodule along the ventral abdominal wall  measures 2.2 x 4.1 cm (previously 1.7 x 3.3 cm), axial 64. multiple additional small areas of nodularity in the epigastric fat adjacent to the ventral abdominal wall (axial 55), more prominent than on the prior study. No pneumoperitoneum. Peritoneal thickening in the right paracolic gutter is also noted (axial 79). Mixed gas and fluid collection in the hepatorenal fossa is otherwise unchanged. Musculoskeletal: No acute fracture or destructive lesion. Moderate anasarca. Intramuscular metastatic disease in the inferior right rectus sheath measuring 1.4 cm (axial 76). IMPRESSION: 1. No acute intra-abdominal or pelvic abnormality. 2. Extensive intrahepatic metastases again noted filling the entire hepatic parenchyma, with slight enlargement of the index mass in the central right hepatic lobe, measuring 5.7 x 8.4 cm (previously, 5.2 x 6.6 cm, axial 38). 3. Worsening of the peritoneal metastatic disease. For example, the peritoneal nodule subjacent to the midline ventral incision (axial 64), measures 2.2 x 4.1 cm (previously, 1.7 x 3.3 cm). Trace malignant ascites in the pelvis. 4. Intramuscular metastatic disease along the inferior right rectus sheath measuring 1.4 cm (axial 76). 5. Similar appearance of the nearly occlusive thrombus within the intrahepatic IVC. Electronically Signed   By: Rogelia Myers M.D.   On: 09/27/2023 12:17   CT CHEST ABDOMEN PELVIS W CONTRAST Result Date: 09/19/2023 CLINICAL DATA:  RCC with liver Mets, clear cell carcinoma of right kidney. * Tracking Code: BO * EXAM: CT CHEST, ABDOMEN, AND PELVIS WITH CONTRAST TECHNIQUE: Multidetector CT imaging of the chest, abdomen and  pelvis was performed following the standard protocol during bolus administration of intravenous contrast. RADIATION DOSE REDUCTION: This exam was performed according to the departmental dose-optimization program which includes automated exposure control, adjustment of the mA and/or kV according to patient size and/or use of iterative reconstruction technique. CONTRAST:  OMNIPAQUE  IOHEXOL  300 MG/ML  SOLN COMPARISON:  Multiple priors including CT February 02, 2023 FINDINGS: CT CHEST FINDINGS Cardiovascular: Normal caliber thoracic aorta. Normal size heart. No significant pericardial effusion/thickening. Mediastinum/Nodes: No suspicious thyroid  nodule. No pathologically enlarged mediastinal, hilar or axillary lymph nodes. Lungs/Pleura: Left lower lobe pulmonary nodule measures 1-2 mm on image 111/4 previously 2 mm. No new suspicious pulmonary nodules or masses. Right lower lobe atelectasis versus scarring. Musculoskeletal: No aggressive lytic or blastic lesion of bone. CT ABDOMEN PELVIS FINDINGS Hepatobiliary: Innumerable bilobar hepatic lesions are progressed from prior examination. For reference: -indexed lesion in the dome of the liver measures 5.2 x 2.9 cm on image 43/2 previously 2.3 x 2.2 cm. Fluid collection in Morison's pouch described on prior examination not confidently seen on today's study. Cholelithiasis.  No biliary ductal dilation. Pancreas: No pancreatic ductal dilation or evidence of acute inflammation. Spleen: No splenomegaly. Adrenals/Urinary Tract: Right adrenal gland and kidney are surgically absent Left adrenal gland and kidney are unremarkable. Urinary bladder is minimally distended. Stomach/Bowel: Stomach is unremarkable for degree of distension. No pathologic dilation of small or large bowel. No evidence of acute bowel inflammation. Vascular/Lymphatic: Mixed attenuating nodularity in the intrahepatic IVC on image 80/2 is similar prior suspicious for thrombus. No pathologically enlarged  abdominal or pelvic lymph nodes identified. Reproductive: Prostate gland is visualized. Other: Overall decreased omental caking with trace abdominopelvic free fluid. Nodular focus in the anterior abdomen subjacent to the incision line measures 3.3 x 1.7 cm on image 87/2 previously 4.1 x 3.0 cm. Musculoskeletal: No aggressive lytic or blastic lesion of bone. IMPRESSION: 1. Innumerable bilobar hepatic lesions are progressed from prior examination, consistent with worsening hepatic metastatic disease. 2. Overall decreased omental caking with trace  abdominopelvic free fluid. 3. Mixed attenuating nodularity in the intrahepatic IVC is similar prior suspicious for thrombus. 4. Left lower lobe pulmonary nodule measures 1-2 mm previously 2 mm. No new suspicious pulmonary nodules or masses. 5. Cholelithiasis. Electronically Signed   By: Reyes Holder M.D.   On: 09/19/2023 16:33      Signed: Harden GAILS. Jude, MD

## 2023-11-05 DEATH — deceased
